# Patient Record
Sex: Female | Born: 1945
Health system: Southern US, Community
[De-identification: ages and names within clinical notes are randomized; demographics above are authoritative.]

## PROBLEM LIST (undated history)

## (undated) DIAGNOSIS — T4145XA Adverse effect of unspecified anesthetic, initial encounter: Secondary | ICD-10-CM

## (undated) DIAGNOSIS — E785 Hyperlipidemia, unspecified: Secondary | ICD-10-CM

## (undated) DIAGNOSIS — K121 Other forms of stomatitis: Secondary | ICD-10-CM

## (undated) DIAGNOSIS — R42 Dizziness and giddiness: Secondary | ICD-10-CM

## (undated) DIAGNOSIS — M159 Polyosteoarthritis, unspecified: Secondary | ICD-10-CM

## (undated) DIAGNOSIS — J309 Allergic rhinitis, unspecified: Secondary | ICD-10-CM

## (undated) DIAGNOSIS — T8859XA Other complications of anesthesia, initial encounter: Secondary | ICD-10-CM

## (undated) DIAGNOSIS — K648 Other hemorrhoids: Secondary | ICD-10-CM

## (undated) DIAGNOSIS — K219 Gastro-esophageal reflux disease without esophagitis: Secondary | ICD-10-CM

## (undated) DIAGNOSIS — R112 Nausea with vomiting, unspecified: Secondary | ICD-10-CM

## (undated) DIAGNOSIS — K5790 Diverticulosis of intestine, part unspecified, without perforation or abscess without bleeding: Secondary | ICD-10-CM

## (undated) DIAGNOSIS — T753XXA Motion sickness, initial encounter: Secondary | ICD-10-CM

## (undated) DIAGNOSIS — Z9889 Other specified postprocedural states: Secondary | ICD-10-CM

## (undated) DIAGNOSIS — D649 Anemia, unspecified: Secondary | ICD-10-CM

## (undated) DIAGNOSIS — T7840XA Allergy, unspecified, initial encounter: Secondary | ICD-10-CM

## (undated) DIAGNOSIS — I872 Venous insufficiency (chronic) (peripheral): Secondary | ICD-10-CM

## (undated) DIAGNOSIS — I82409 Acute embolism and thrombosis of unspecified deep veins of unspecified lower extremity: Secondary | ICD-10-CM

## (undated) HISTORY — DX: Polyosteoarthritis, unspecified: M15.9

## (undated) HISTORY — DX: Other forms of stomatitis: K12.1

## (undated) HISTORY — DX: Allergy, unspecified, initial encounter: T78.40XA

## (undated) HISTORY — DX: Hyperlipidemia, unspecified: E78.5

## (undated) HISTORY — DX: Venous insufficiency (chronic) (peripheral): I87.2

## (undated) HISTORY — DX: Anemia, unspecified: D64.9

## (undated) HISTORY — DX: Gastro-esophageal reflux disease without esophagitis: K21.9

## (undated) HISTORY — PX: KNEE ARTHROSCOPY: SUR90

## (undated) HISTORY — DX: Allergic rhinitis, unspecified: J30.9

## (undated) HISTORY — PX: TONSILLECTOMY AND ADENOIDECTOMY: SUR1326

## (undated) HISTORY — DX: Acute embolism and thrombosis of unspecified deep veins of unspecified lower extremity: I82.409

---

## 1994-12-15 HISTORY — PX: ABDOMINAL HYSTERECTOMY: SHX81

## 1998-12-15 HISTORY — PX: CHOLECYSTECTOMY: SHX55

## 2001-12-15 HISTORY — PX: BREAST BIOPSY: SHX20

## 2002-04-06 IMAGING — MG UNKNOWN MG STUDY
1 series · 6 of 6 positions shown · non-contrast
Comparison: none

REASON FOR EXAM: dx uni rt & fsc 611.72...hm [id number]

[Series 4106: R CC · right · 6 of 6 slices shown]
[im 1/6]
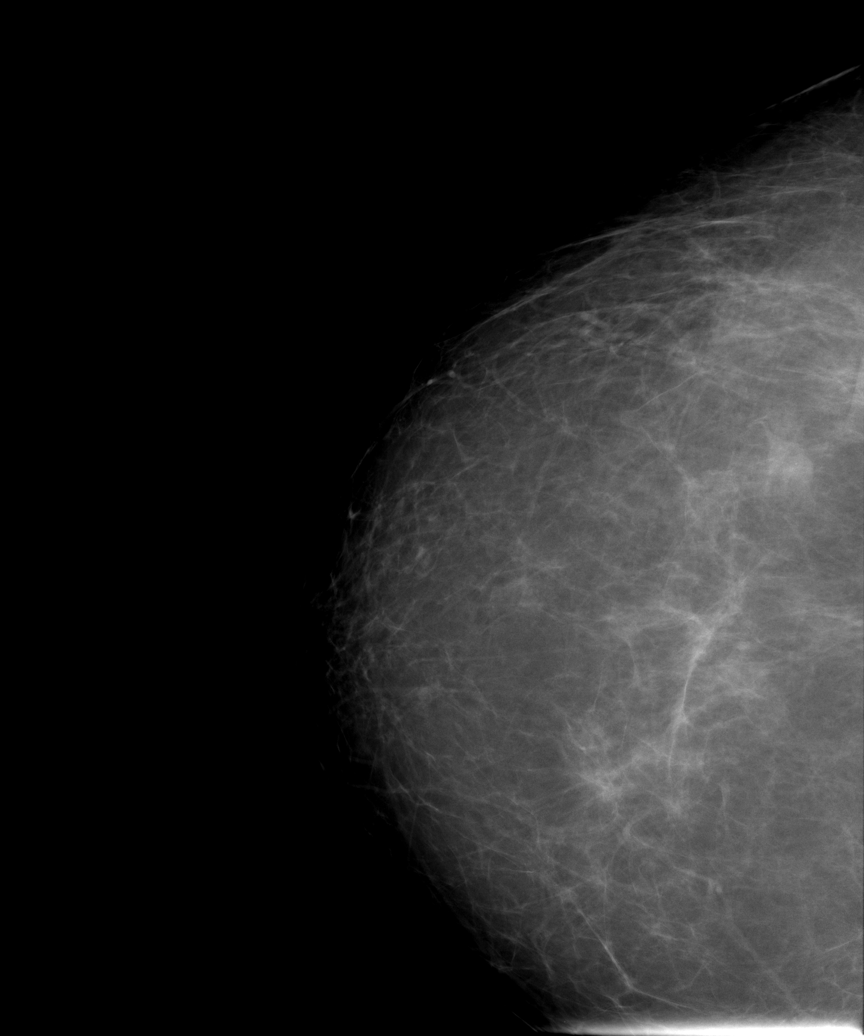
[im 2/6]
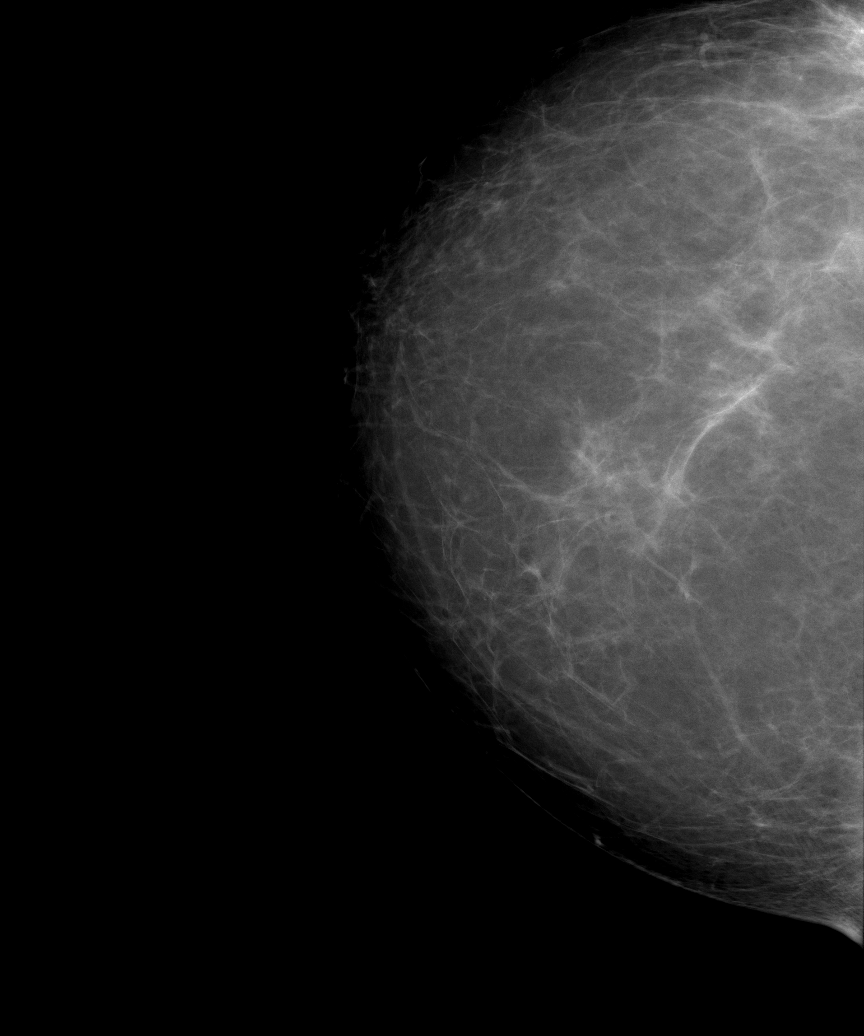
[im 3/6]
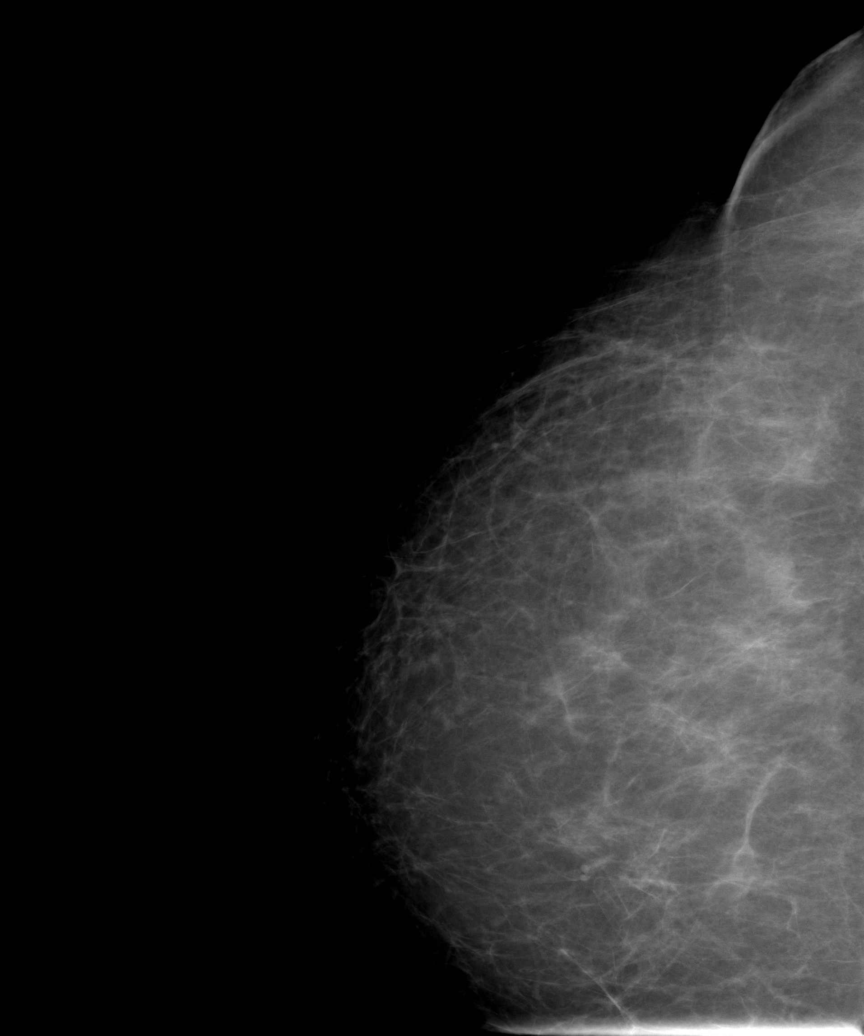
[im 4/6]
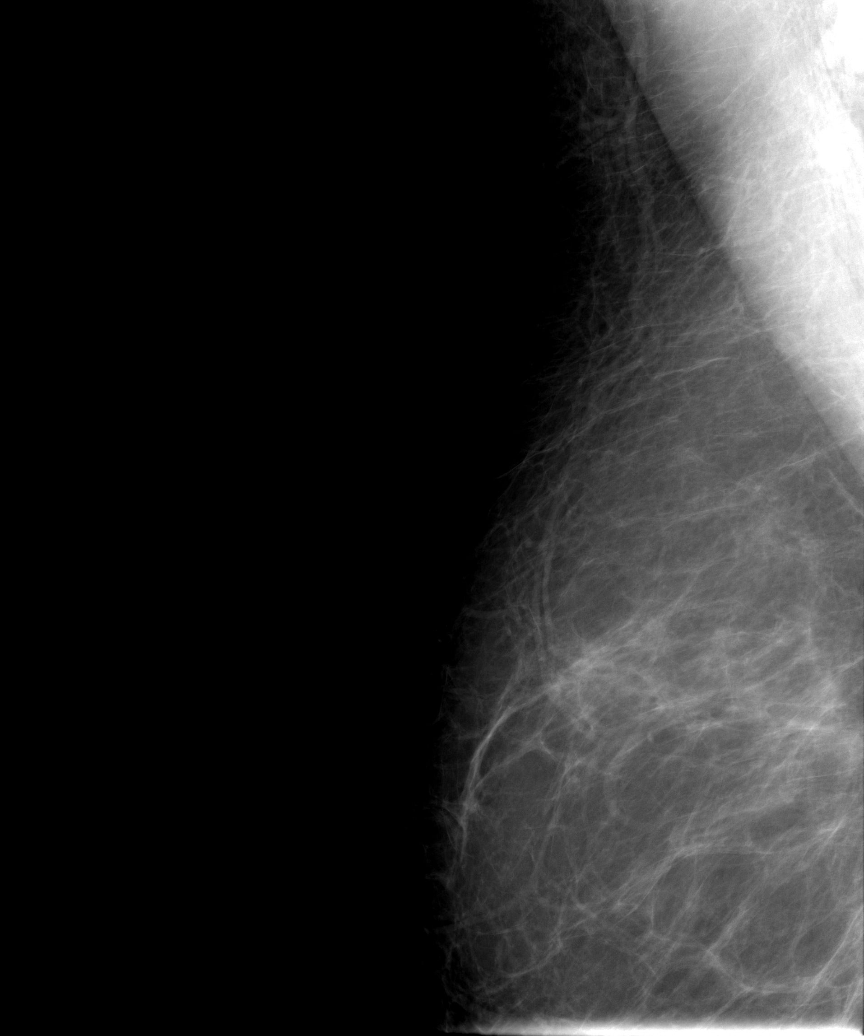
[im 5/6]
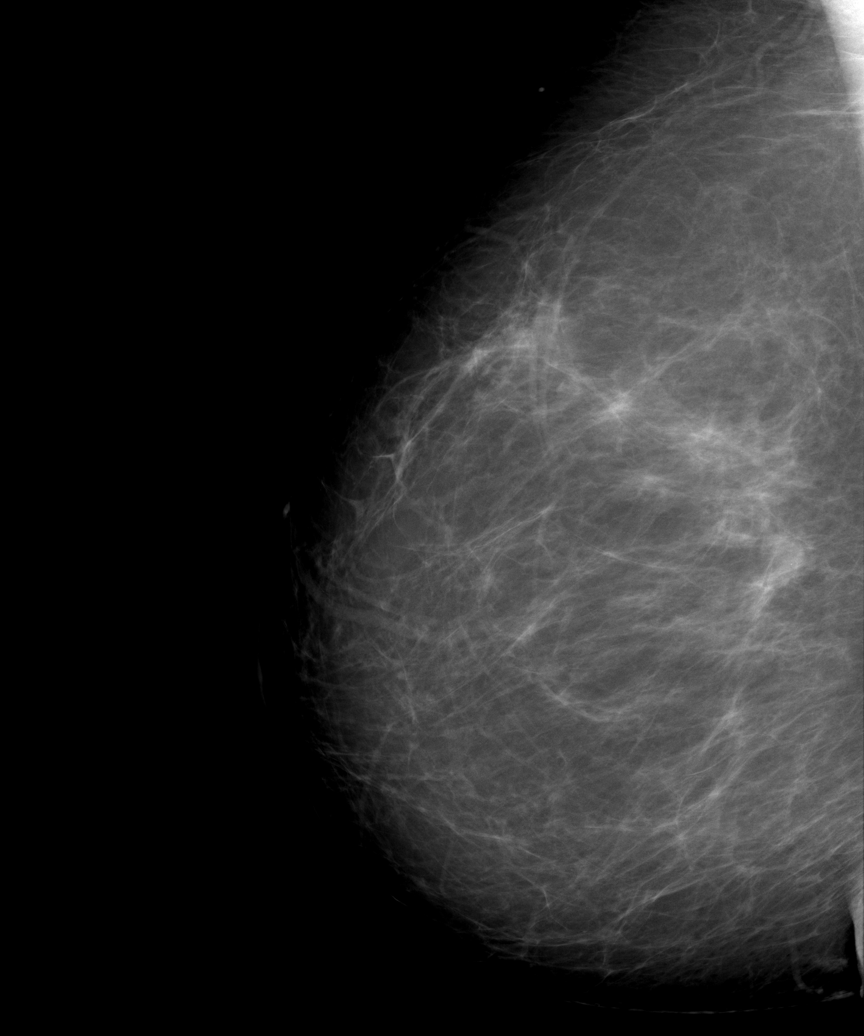
[im 6/6]
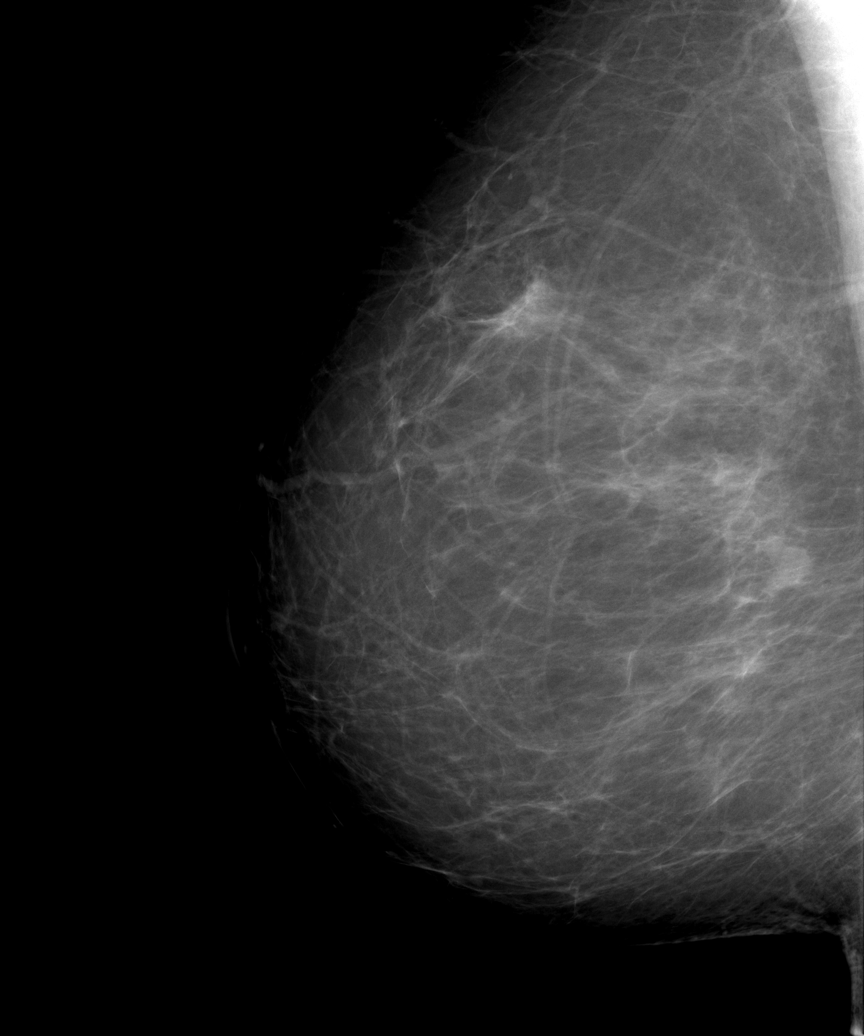

[6 of 6 positions shown; findings below may reference images not displayed]

Procedure: DIGITAL RIGHT MAMMOGRAM

 Multiple images were obtained and compared with previous film screen of
08/04/01 and 06/03/00. There is noted some subtle nodularity to the upper
outer portion of the RIGHT breast. It appears less obvious than on the
previous exam. Certainly not more prominent. Previous ultrasound was
negative in this region. Continued bilateral follow up in 6-months when
bilateral is due would be recommended.

 BI-RADS: Category 3-Probably Benign Finding.

 A NEGATIVE MAMMOGRAM REPORT DOES NOT PRECLUDE BIOPSY OR OTHER EVALUATION OF
A CLINICALLY PALPABLE OR OTHERWISE SUSPICIOUS MASS OR LESION. BREAST CANCER
MAY NOT BE DETECTED BY MAMMOGRAPHY IN UP TO 10% OF CASES.

## 2004-10-09 ENCOUNTER — Ambulatory Visit: Payer: Self-pay | Admitting: Unknown Physician Specialty

## 2005-10-22 ENCOUNTER — Ambulatory Visit: Payer: Self-pay | Admitting: Unknown Physician Specialty

## 2006-12-02 ENCOUNTER — Ambulatory Visit: Payer: Self-pay | Admitting: Specialist

## 2008-11-03 LAB — HM DEXA SCAN: HM Dexa Scan: NORMAL

## 2008-12-22 LAB — HM COLONOSCOPY

## 2010-02-21 ENCOUNTER — Ambulatory Visit: Payer: Self-pay | Admitting: Gastroenterology

## 2010-04-27 ENCOUNTER — Emergency Department: Payer: Self-pay | Admitting: Emergency Medicine

## 2010-05-29 ENCOUNTER — Ambulatory Visit: Payer: Self-pay | Admitting: Specialist

## 2010-08-22 ENCOUNTER — Ambulatory Visit: Payer: Self-pay | Admitting: Pain Medicine

## 2010-11-01 ENCOUNTER — Ambulatory Visit: Payer: Self-pay | Admitting: Specialist

## 2011-08-20 ENCOUNTER — Ambulatory Visit: Payer: Self-pay | Admitting: Family

## 2011-08-26 ENCOUNTER — Ambulatory Visit (INDEPENDENT_AMBULATORY_CARE_PROVIDER_SITE_OTHER): Payer: Medicare PPO | Admitting: Internal Medicine

## 2011-08-26 ENCOUNTER — Encounter: Payer: Self-pay | Admitting: Internal Medicine

## 2011-08-26 DIAGNOSIS — Z23 Encounter for immunization: Secondary | ICD-10-CM

## 2011-08-26 DIAGNOSIS — E785 Hyperlipidemia, unspecified: Secondary | ICD-10-CM | POA: Insufficient documentation

## 2011-08-26 DIAGNOSIS — K259 Gastric ulcer, unspecified as acute or chronic, without hemorrhage or perforation: Secondary | ICD-10-CM | POA: Insufficient documentation

## 2011-08-26 DIAGNOSIS — M48062 Spinal stenosis, lumbar region with neurogenic claudication: Secondary | ICD-10-CM | POA: Insufficient documentation

## 2011-08-26 DIAGNOSIS — I82409 Acute embolism and thrombosis of unspecified deep veins of unspecified lower extremity: Secondary | ICD-10-CM | POA: Insufficient documentation

## 2011-08-26 DIAGNOSIS — I872 Venous insufficiency (chronic) (peripheral): Secondary | ICD-10-CM

## 2011-08-26 DIAGNOSIS — K219 Gastro-esophageal reflux disease without esophagitis: Secondary | ICD-10-CM

## 2011-08-26 DIAGNOSIS — M159 Polyosteoarthritis, unspecified: Secondary | ICD-10-CM

## 2011-08-26 DIAGNOSIS — J309 Allergic rhinitis, unspecified: Secondary | ICD-10-CM | POA: Insufficient documentation

## 2011-08-26 NOTE — Assessment & Plan Note (Signed)
Ongoing back problems that defy diagnosis Has been sedentary due to DVT and knees Needs to be more active Etodolac helps but has been concerned due to coumadin

## 2011-08-26 NOTE — Assessment & Plan Note (Signed)
Some indigestion but basically controlled Continue the PPI--esp since using NSAID and past history of ulcers

## 2011-08-26 NOTE — Progress Notes (Signed)
Subjective:    Patient ID: Alice Donaldson, female    DOB: 1946-04-13, 65 y.o.   MRN: [id number]  HPI Needs primary care physician Dr Barnabas Lister had been doing primary care I had taken care of her mom at [facility] and after  Has GERD Has had EGD by Dr Roque Lias on PPI No swallowing problems  On treatment for high cholesterol Parents did die from MIs but not young and had other problems She doesn't feel well on it Has muscle aching in general  Working on proper eating Hopes to lose weight Hard to exercise due to ongoing back problems Has seen ortho here and someone at [facility] Does try to walk briefly every day  Had knee arthroscopy on left 6/11 Had increasing edema Diagnosed with DVT in September On coumadin since then (initially hospitalized)  Saw Dr Earnestine Leys Has had lymphedema and venous insufficiency Now with ulcer on left leg---PA with Dr Earnestine Leys managing this Using silvadene  No current outpatient prescriptions on file prior to visit.    No Known Allergies  Past Medical History  Diagnosis Date  . GERD (gastroesophageal reflux disease)   . Allergic rhinitis, cause unspecified   . Osteoarthrosis involving, or with mention of more than one site, but not specified as generalized, multiple sites   . Hyperlipidemia   . DVT (deep venous thrombosis)   . Unspecified venous (peripheral) insufficiency     Past Surgical History  Procedure Date  . Cholecystectomy 2000  . Breast biopsy 2003  . Tonsillectomy and adenoidectomy childhood  . Abdominal hysterectomy 1996  . Knee arthroscopy     right in 2007, left in 2011    Family History  Problem Relation Age of Onset  . Heart disease Mother   . Heart disease Father   . Diabetes Son 5    Type 1  . Hypertension Son   . Hyperlipidemia Neg Hx   . Depression Neg Hx   . Stroke Brother   . Diabetes Other 2    type 1    History   Social History  . Marital Status: Married    Spouse Name: N/A    Number of  Children: 1  . Years of Education: N/A   Occupational History  . Dental hygienist     retired   Social History Main Topics  . Smoking status: Former Smoker    Types: Cigarettes    Quit date: 12/15/1990  . Smokeless tobacco: Never Used  . Alcohol Use: Yes     rare glass of wine  . Drug Use: No  . Sexually Active: Not on file   Other Topics Concern  . Not on file   Social History Narrative   No living willAsks for husband to make decisions if neededWould accept resuscitation   Review of Systems  Constitutional: Positive for unexpected weight change.       Has gained 30# in past year with immobility Wears seat belt  HENT: Positive for tinnitus. Negative for hearing loss and dental problem.   Eyes: Negative for visual disturbance.  Respiratory: Negative for cough and shortness of breath.        Occ gets sense she needs sighing breath  Cardiovascular: Positive for palpitations and leg swelling. Negative for chest pain.       Occ brief palps at rest---stable over years  Gastrointestinal:       Heartburn mostly controlled on meds---now with more indigestion  Genitourinary: Negative for dysuria, difficulty urinating and dyspareunia.  Musculoskeletal:  Positive for back pain and arthralgias.       Uses the etodolac once a day--it really helps  Neurological: Negative for dizziness, syncope, light-headedness and headaches.  Psychiatric/Behavioral: Positive for sleep disturbance and dysphoric mood. The patient is not nervous/anxious.        Occ trouble sleeping---better since not working Has used lorazepam just for sleep at times Mild depressed mood due to being forced to retire and tough health year (causing retirement)       Objective:   Physical Exam  Constitutional: She appears well-developed and well-nourished. No distress.  HENT:  Mouth/Throat: Oropharynx is clear and moist. No oropharyngeal exudate.  Eyes: Conjunctivae and EOM are normal. Pupils are equal, round, and  reactive to light.  Neck: Normal range of motion. Neck supple. No thyromegaly present.  Cardiovascular: Normal rate, regular rhythm, normal heart sounds and intact distal pulses.  Exam reveals no gallop.   No murmur heard. Pulmonary/Chest: Effort normal and breath sounds normal. No respiratory distress. She has no wheezes. She has no rales.  Abdominal: Soft. There is no tenderness.  Musculoskeletal: Normal range of motion. She exhibits edema. She exhibits no tenderness.       1+ edema in ankles  Lymphadenopathy:    She has no cervical adenopathy.  Skin: No rash noted.       Slight ulcerations along medial left ankle which now have red granulation tissue No inflammation          Assessment & Plan:

## 2011-08-26 NOTE — Assessment & Plan Note (Signed)
Has myalgias on the simvastatin No arterial vascular disease  Will stop for now

## 2011-08-26 NOTE — Assessment & Plan Note (Addendum)
Has been almost 1 year since DVT post op Had ultrasound that showed chronic DVT with recanalization Has appt at [facility] tomorrow--needs definitive answer Could consider checking d-dimer

## 2011-08-26 NOTE — Patient Instructions (Signed)
Please ask the vascular specialist whether you can go off the coumadin with the chronic but recanalized DVT. Do we need to check d-dimer first, and after going off coumadin, if reasonable?

## 2011-08-26 NOTE — Assessment & Plan Note (Signed)
With healing ulcer Probably needs to use her support hose but can hold off on pump

## 2011-09-02 ENCOUNTER — Encounter: Payer: Self-pay | Admitting: Internal Medicine

## 2011-09-15 ENCOUNTER — Ambulatory Visit: Payer: Self-pay | Admitting: Family

## 2011-09-17 ENCOUNTER — Other Ambulatory Visit: Payer: Self-pay | Admitting: *Deleted

## 2011-09-17 MED ORDER — ETODOLAC 500 MG PO TABS: 500.0000 mg | ORAL_TABLET | Freq: Two times a day (BID) | ORAL | Status: DC

## 2011-09-17 NOTE — Telephone Encounter (Signed)
Phoned request from patient.  She has previously gotten this from Dr. Barnabas Lister in [place].  I advised her that Dr. Alphonsus Sias is out of the office and it would be Thursday.

## 2011-09-19 DIAGNOSIS — I739 Peripheral vascular disease, unspecified: Secondary | ICD-10-CM | POA: Insufficient documentation

## 2011-09-19 DIAGNOSIS — I999 Unspecified disorder of circulatory system: Secondary | ICD-10-CM | POA: Insufficient documentation

## 2011-09-22 ENCOUNTER — Ambulatory Visit (INDEPENDENT_AMBULATORY_CARE_PROVIDER_SITE_OTHER): Payer: Medicare PPO | Admitting: Internal Medicine

## 2011-09-22 ENCOUNTER — Other Ambulatory Visit: Payer: Self-pay | Admitting: Internal Medicine

## 2011-09-22 ENCOUNTER — Encounter: Payer: Self-pay | Admitting: Internal Medicine

## 2011-09-22 ENCOUNTER — Telehealth: Payer: Self-pay | Admitting: *Deleted

## 2011-09-22 VITALS — BP 134/92 | HR 74 | Ht 67.0 in | Wt 257.0 lb

## 2011-09-22 DIAGNOSIS — I82409 Acute embolism and thrombosis of unspecified deep veins of unspecified lower extremity: Secondary | ICD-10-CM

## 2011-09-22 DIAGNOSIS — M159 Polyosteoarthritis, unspecified: Secondary | ICD-10-CM

## 2011-09-22 DIAGNOSIS — K259 Gastric ulcer, unspecified as acute or chronic, without hemorrhage or perforation: Secondary | ICD-10-CM

## 2011-09-22 DIAGNOSIS — Z7902 Long term (current) use of antithrombotics/antiplatelets: Secondary | ICD-10-CM

## 2011-09-22 DIAGNOSIS — E785 Hyperlipidemia, unspecified: Secondary | ICD-10-CM

## 2011-09-22 DIAGNOSIS — Z7901 Long term (current) use of anticoagulants: Secondary | ICD-10-CM | POA: Insufficient documentation

## 2011-09-22 DIAGNOSIS — Z5181 Encounter for therapeutic drug level monitoring: Secondary | ICD-10-CM | POA: Insufficient documentation

## 2011-09-22 LAB — POCT INR: INR: 2.4

## 2011-09-22 NOTE — Assessment & Plan Note (Addendum)
will continue the PPI since she is on the etodolac Some night symptoms---will try omeprazole at night or bid

## 2011-09-22 NOTE — Progress Notes (Signed)
Subjective:    Patient ID: Alice Donaldson, female    DOB: Nov 15, 1946, 65 y.o.   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  Most is once a day Rarely uses the muscle relaxer  Did stop the simvastatin Has helped the muscle aching  Current Outpatient Prescriptions on File Prior to Visit  Medication Sig Dispense Refill  . acetaminophen (TYLENOL) 500 MG tablet Take 500 mg by mouth every 6 (six) hours as needed.        . Biotin 10 MG TABS Take by mouth daily.        . Calcium Carbonate-Vitamin D (CALCIUM-VITAMIN D) 600-200 MG-UNIT CAPS Take by mouth daily.        . chlorzoxazone (PARAFON) 500 MG tablet Take 500 mg by mouth as needed.        . Cholecalciferol (VITAMIN D3) 1000 UNITS CAPS Take by mouth daily.        [name] docusate sodium (COLACE) 100 MG capsule Take 100 mg by mouth as needed.       . etodolac (LODINE) 500 MG tablet Take 1 tablet (500 mg total) by mouth 2 (two) times daily.  30 tablet  0  . LORazepam (ATIVAN) 1 MG tablet Take 0.5 mg by mouth as needed.        [name] omeprazole (PRILOSEC) 20 MG capsule Take 20 mg by mouth daily.       [name] PROBIOTIC CAPS Take by mouth daily.        [name] warfarin (COUMADIN) 5 MG tablet Take 5 mg by mouth as directed.       . warfarin (COUMADIN) 6 MG tablet Take 6 mg by mouth as directed.       . zinc gluconate 50 MG tablet Take 50 mg by mouth daily.          No Known Allergies  Past Medical History  Diagnosis Date  . GERD (gastroesophageal reflux  disease)   . Allergic rhinitis, cause unspecified   . Osteoarthrosis involving, or with mention of more than one site, but not specified as generalized, multiple sites   . Hyperlipidemia   . DVT (deep venous thrombosis)   . Unspecified venous (peripheral) insufficiency   . Gastric ulcer     in past    Past Surgical History  Procedure Date  . Cholecystectomy 2000  . Breast biopsy 2003  . Tonsillectomy and adenoidectomy childhood  . Abdominal hysterectomy 1996  . Knee arthroscopy     right in 2007, left in 2011    Family History  Problem Relation Age of Onset  . Heart disease Mother   . Heart disease Father   . Diabetes Son 5    Type 1  . Hypertension Son   . Hyperlipidemia Neg Hx   . Depression Neg Hx   . Stroke Brother   . Diabetes Other 2    type 1    History   Social History  . Marital Status: Married    Spouse Name: N/A  Number of Children: 1  . Years of Education: N/A   Occupational History  . Dental hygienist     retired   Social History Main Topics  . Smoking status: Former Smoker    Types: Cigarettes    Quit date: 12/15/1990  . Smokeless tobacco: Never Used  . Alcohol Use: Yes     rare glass of wine  . Drug Use: No  . Sexually Active: Not on file   Other Topics Concern  . Not on file   Social History Narrative   No living willAsks for husband to make decisions if neededWould accept resuscitation   Review of Systems Sleeping okay Weight is stable     Objective:   Physical Exam  Constitutional: She appears well-developed and well-nourished. No distress.  Neck: Normal range of motion. Neck supple.  Cardiovascular: Normal rate, regular rhythm, normal heart sounds and intact distal pulses.  Exam reveals no gallop.   No murmur heard. Pulmonary/Chest: Effort normal and breath sounds normal. No respiratory distress. She has no wheezes. She has no rales.  Musculoskeletal: She exhibits no edema and no tenderness.  Lymphadenopathy:    She has  no cervical adenopathy.  Skin:       Small ulcer--mostly granulated--on anterior surface of left ankle  Psychiatric: She has a normal mood and affect. Her behavior is normal. Judgment and thought content normal.          Assessment & Plan:

## 2011-09-22 NOTE — Patient Instructions (Signed)
Continue current dose, check in 4 weeks  

## 2011-09-22 NOTE — Assessment & Plan Note (Signed)
Myalgias better off the simvastatin Will stay off and recheck at a later visit

## 2011-09-22 NOTE — Assessment & Plan Note (Signed)
Left knee is somewhat better but still has some pain Uses the etodolac daily Is getting stronger

## 2011-09-22 NOTE — Assessment & Plan Note (Signed)
Chronic Specialist believes she can go off the coumadin once she can wear compression hose regularly For now--continue coumadin  Would check d-dimer several weeks after stopping

## 2011-09-24 NOTE — Telephone Encounter (Signed)
Opened in error

## 2011-10-20 ENCOUNTER — Ambulatory Visit (INDEPENDENT_AMBULATORY_CARE_PROVIDER_SITE_OTHER): Payer: Medicare PPO | Admitting: Internal Medicine

## 2011-10-20 DIAGNOSIS — Z5181 Encounter for therapeutic drug level monitoring: Secondary | ICD-10-CM

## 2011-10-20 DIAGNOSIS — Z7901 Long term (current) use of anticoagulants: Secondary | ICD-10-CM

## 2011-10-20 DIAGNOSIS — I82409 Acute embolism and thrombosis of unspecified deep veins of unspecified lower extremity: Secondary | ICD-10-CM

## 2011-10-20 LAB — POCT INR: INR: 2.4

## 2011-11-17 ENCOUNTER — Other Ambulatory Visit: Payer: Self-pay | Admitting: Internal Medicine

## 2011-11-17 MED ORDER — PROBIOTIC PO CAPS: 1.0000 | ORAL_CAPSULE | Freq: Every day | ORAL | Status: DC

## 2011-11-17 NOTE — Telephone Encounter (Signed)
rx sent to pharmacy by e-script  

## 2011-11-17 NOTE — Telephone Encounter (Signed)
This might be OTC but okay to refill for 1 year

## 2011-11-26 ENCOUNTER — Other Ambulatory Visit: Payer: Self-pay | Admitting: Internal Medicine

## 2011-11-28 ENCOUNTER — Encounter: Payer: Self-pay | Admitting: Internal Medicine

## 2011-12-01 ENCOUNTER — Encounter: Payer: Self-pay | Admitting: *Deleted

## 2011-12-01 ENCOUNTER — Other Ambulatory Visit: Payer: Self-pay | Admitting: *Deleted

## 2011-12-01 MED ORDER — ETODOLAC 500 MG PO TABS: 500.0000 mg | ORAL_TABLET | Freq: Two times a day (BID) | ORAL | Status: DC

## 2011-12-04 ENCOUNTER — Ambulatory Visit (INDEPENDENT_AMBULATORY_CARE_PROVIDER_SITE_OTHER): Payer: Medicare PPO | Admitting: Internal Medicine

## 2011-12-04 ENCOUNTER — Telehealth: Payer: Self-pay | Admitting: Internal Medicine

## 2011-12-04 ENCOUNTER — Encounter: Payer: Self-pay | Admitting: Internal Medicine

## 2011-12-04 VITALS — BP 140/90 | HR 73 | Temp 98.2°F | Wt 257.0 lb

## 2011-12-04 DIAGNOSIS — J209 Acute bronchitis, unspecified: Secondary | ICD-10-CM | POA: Insufficient documentation

## 2011-12-04 MED ORDER — AZITHROMYCIN 250 MG PO TABS: ORAL_TABLET | ORAL | Status: AC

## 2011-12-04 NOTE — Telephone Encounter (Signed)
Spoke with patient and she declined today, per pt she haven't showered today and don't feel like rushing to get here, she will see Para March tomorrow.

## 2011-12-04 NOTE — Telephone Encounter (Signed)
Patient called and wanted to know if we could call in a antibiotic for her congestion.  I made her an appt. For tomorrow with Para March.  I informed her that we can't call an antibiotic in without evaluating her but she insisted I ask you.

## 2011-12-04 NOTE — Assessment & Plan Note (Signed)
Has been sick over a week and worsening Feels it moving in chest Will start empiric rx with z-pak

## 2011-12-04 NOTE — Telephone Encounter (Signed)
No, we just don't do that You can add her on at 4:30 today if she wants to come in today

## 2011-12-04 NOTE — Progress Notes (Signed)
Subjective:    Patient ID: Alice Donaldson, female    DOB: Dec 11, 1946, 65 y.o.   MRN: [id number]  HPI Not on coumadin anymore Still seeing vascular specialist at [facility]) Wears support hose all the time now very faithfully  Not ready for back injections since the fungal meninigitis  Started with respiratory illness over a week ago Cough at first--with some phlegm Voice is off since yesterday  May have had low grade fever--feels hot with head sweating, then cold Swallowing any mucus Left side of throat feels swollen Not really in head Does have ringing in ears and feels fluid on right Some SOB  Only dayquil---some help  Current Outpatient Prescriptions on File Prior to Visit  Medication Sig Dispense Refill  . acetaminophen (TYLENOL) 500 MG tablet Take 500 mg by mouth every 6 (six) hours as needed.        . Biotin 10 MG TABS Take by mouth daily.        . Calcium Carbonate-Vitamin D (CALCIUM-VITAMIN D) 600-200 MG-UNIT CAPS Take by mouth daily.        . chlorzoxazone (PARAFON) 500 MG tablet Take 500 mg by mouth as needed.        . Cholecalciferol (VITAMIN D3) 1000 UNITS CAPS Take by mouth daily.        [name] docusate sodium (COLACE) 100 MG capsule Take 100 mg by mouth as needed.       . etodolac (LODINE) 500 MG tablet Take 1 tablet (500 mg total) by mouth 2 (two) times daily.  60 tablet  11  . LORazepam (ATIVAN) 1 MG tablet Take 0.5 mg by mouth as needed.        [name] omeprazole (PRILOSEC) 20 MG capsule Take 20 mg by mouth daily.       [name] PROBIOTIC CAPS Take 1 capsule by mouth daily.  100 capsule  3  . zinc gluconate 50 MG tablet Take 50 mg by mouth daily.          No Known Allergies  Past Medical History  Diagnosis Date  . GERD (gastroesophageal reflux disease)   . Allergic rhinitis, cause unspecified   . Osteoarthrosis involving, or with mention of more than one site, but not specified as generalized, multiple sites   . Hyperlipidemia   . DVT (deep venous thrombosis)   .  Unspecified venous (peripheral) insufficiency   . Gastric ulcer     in past    Past Surgical History  Procedure Date  . Cholecystectomy 2000  . Breast biopsy 2003  . Tonsillectomy and adenoidectomy childhood  . Abdominal hysterectomy 1996  . Knee arthroscopy     right in 2007, left in 2011    Family History  Problem Relation Age of Onset  . Heart disease Mother   . Heart disease Father   . Diabetes Son 5    Type 1  . Hypertension Son   . Hyperlipidemia Neg Hx   . Depression Neg Hx   . Stroke Brother   . Diabetes Other 2    type 1    History   Social History  . Marital Status: Married    Spouse Name: N/A    Number of Children: 1  . Years of Education: N/A   Occupational History  . Dental hygienist     retired   Social History Main Topics  . Smoking status: Former Smoker    Types: Cigarettes    Quit date: 12/15/1990  . Smokeless tobacco: Never Used  .  Alcohol Use: Yes     rare glass of wine  . Drug Use: No  . Sexually Active: Not on file   Other Topics Concern  . Not on file   Social History Narrative   No living willAsks for husband to make decisions if neededWould accept resuscitation   Review of Systems No vomiting or diarrhea Appetite okay     Objective:   Physical Exam  Constitutional: She appears well-developed and well-nourished. No distress.  HENT:       No sinus tenderness Mild nasal congestion Slight pharyngeal injection--no exudate  Neck: Normal range of motion.  Pulmonary/Chest: Effort normal and breath sounds normal. No respiratory distress. She has no wheezes. She has no rales.  Lymphadenopathy:    She has no cervical adenopathy.          Assessment & Plan:

## 2011-12-04 NOTE — Telephone Encounter (Signed)
Now has decided to come

## 2011-12-05 ENCOUNTER — Ambulatory Visit: Payer: Medicare PPO | Admitting: Family Medicine

## 2012-01-21 ENCOUNTER — Other Ambulatory Visit: Payer: Self-pay | Admitting: Internal Medicine

## 2012-01-21 NOTE — Telephone Encounter (Signed)
No answer at home number, .left message on cell to have patient return my call.

## 2012-01-22 MED ORDER — ETODOLAC 500 MG PO TABS: 500.0000 mg | ORAL_TABLET | Freq: Two times a day (BID) | ORAL | Status: DC

## 2012-01-22 NOTE — Telephone Encounter (Signed)
rx faxed to [facility]

## 2012-01-23 ENCOUNTER — Other Ambulatory Visit: Payer: Self-pay | Admitting: *Deleted

## 2012-01-23 MED ORDER — OMEPRAZOLE 20 MG PO CPDR: 20.0000 mg | DELAYED_RELEASE_CAPSULE | Freq: Every day | ORAL | Status: DC

## 2012-03-23 ENCOUNTER — Ambulatory Visit: Payer: Medicare PPO | Admitting: Internal Medicine

## 2012-04-06 ENCOUNTER — Ambulatory Visit (INDEPENDENT_AMBULATORY_CARE_PROVIDER_SITE_OTHER): Payer: Medicare PPO | Admitting: Internal Medicine

## 2012-04-06 ENCOUNTER — Encounter: Payer: Self-pay | Admitting: Internal Medicine

## 2012-04-06 VITALS — BP 128/88 | HR 70 | Temp 98.0°F | Ht 67.0 in | Wt 256.0 lb

## 2012-04-06 DIAGNOSIS — M159 Polyosteoarthritis, unspecified: Secondary | ICD-10-CM

## 2012-04-06 DIAGNOSIS — K259 Gastric ulcer, unspecified as acute or chronic, without hemorrhage or perforation: Secondary | ICD-10-CM

## 2012-04-06 DIAGNOSIS — E785 Hyperlipidemia, unspecified: Secondary | ICD-10-CM

## 2012-04-06 DIAGNOSIS — K219 Gastro-esophageal reflux disease without esophagitis: Secondary | ICD-10-CM

## 2012-04-06 DIAGNOSIS — I872 Venous insufficiency (chronic) (peripheral): Secondary | ICD-10-CM

## 2012-04-06 LAB — CBC WITH DIFFERENTIAL/PLATELET
Basophils Absolute: 0 10*3/uL (ref 0.0–0.1)
Basophils Relative: 0.7 % (ref 0.0–3.0)
Eosinophils Absolute: 0.2 10*3/uL (ref 0.0–0.7)
HCT: 43.2 % (ref 36.0–46.0)
Hemoglobin: 14.5 g/dL (ref 12.0–15.0)
Lymphocytes Relative: 33.8 % (ref 12.0–46.0)
Lymphs Abs: 2 10*3/uL (ref 0.7–4.0)
MCHC: 33.6 g/dL (ref 30.0–36.0)
Monocytes Relative: 11.9 % (ref 3.0–12.0)
Neutro Abs: 3 10*3/uL (ref 1.4–7.7)
RBC: 4.69 Mil/uL (ref 3.87–5.11)
RDW: 13.1 % (ref 11.5–14.6)

## 2012-04-06 LAB — HEPATIC FUNCTION PANEL
ALT: 23 U/L (ref 0–35)
Albumin: 3.7 g/dL (ref 3.5–5.2)
Bilirubin, Direct: 0 mg/dL (ref 0.0–0.3)
Total Protein: 7.2 g/dL (ref 6.0–8.3)

## 2012-04-06 LAB — LIPID PANEL: Total CHOL/HDL Ratio: 6

## 2012-04-06 LAB — BASIC METABOLIC PANEL
CO2: 27 mEq/L (ref 19–32)
Glucose, Bld: 86 mg/dL (ref 70–99)
Potassium: 4.3 mEq/L (ref 3.5–5.1)
Sodium: 140 mEq/L (ref 135–145)

## 2012-04-06 LAB — LDL CHOLESTEROL, DIRECT: Direct LDL: 169.2 mg/dL

## 2012-04-06 NOTE — Assessment & Plan Note (Signed)
Mostly back pain Getting acupuncture Uses the etodolac regularly--remains on PPI

## 2012-04-06 NOTE — Assessment & Plan Note (Signed)
Will stay on the meds

## 2012-04-06 NOTE — Assessment & Plan Note (Signed)
Symptoms seem better

## 2012-04-06 NOTE — Assessment & Plan Note (Signed)
Left calf after her surgery Off the coumadin and just uses the support hose 6 month follow up at [facility]

## 2012-04-06 NOTE — Progress Notes (Signed)
Subjective:    Patient ID: Alice Donaldson, female    DOB: 1946/04/13, 66 y.o.   MRN: [id number]  HPI Doing well in general  Currently doing acupuncture for her back (Dr Lianne Bushy in [place]) Limits her movement Variable results from that---occ gets good day Has been doing a bit more walking now Still uses etodolac and this helps Pain with any prolonged standing or walking  Omeprazole stopped helping stomach Now on pantoprazole---seems to be helping now (Dr Marva Panda changed) Had been on sucralfate for a while---now down to bid  Off coumadin since November Got the okay from [facility] doctors Did have follow up testing (d-dimer) apparently--and it was okay  No sig edema Ulcer healed well Wears support hose just on left leg---still trouble getting them on Wonders about the right leg---probably good to use there also but not obligatory Right leg doesn't swell  Current Outpatient Prescriptions on File Prior to Visit  Medication Sig Dispense Refill  . Calcium Carbonate-Vitamin D (CALCIUM-VITAMIN D) 600-200 MG-UNIT CAPS Take by mouth daily.        . chlorzoxazone (PARAFON) 500 MG tablet Take 500 mg by mouth as needed.        . Cholecalciferol (VITAMIN D3) 1000 UNITS CAPS Take by mouth daily.        [name] docusate sodium (COLACE) 100 MG capsule Take 100 mg by mouth as needed.       . etodolac (LODINE) 500 MG tablet Take 1 tablet (500 mg total) by mouth 2 (two) times daily.  180 tablet  3  . LORazepam (ATIVAN) 1 MG tablet Take 0.5 mg by mouth as needed.       . pantoprazole (PROTONIX) 40 MG tablet Take 40 mg by mouth daily.       [name] PROBIOTIC CAPS Take 1 capsule by mouth daily.  100 capsule  3  . sucralfate (CARAFATE) 1 G tablet Take 1 g by mouth 2 (two) times daily.         No Known Allergies  Past Medical History  Diagnosis Date  . GERD (gastroesophageal reflux disease)   . Allergic rhinitis, cause unspecified   . Osteoarthrosis involving, or with mention of more than one site, but not  specified as generalized, multiple sites   . Hyperlipidemia   . DVT (deep venous thrombosis)   . Unspecified venous (peripheral) insufficiency   . Gastric ulcer     in past    Past Surgical History  Procedure Date  . Cholecystectomy 2000  . Breast biopsy 2003  . Tonsillectomy and adenoidectomy childhood  . Abdominal hysterectomy 1996  . Knee arthroscopy     right in 2007, left in 2011    Family History  Problem Relation Age of Onset  . Heart disease Mother   . Heart disease Father   . Diabetes Son 5    Type 1  . Hypertension Son   . Hyperlipidemia Neg Hx   . Depression Neg Hx   . Stroke Brother   . Diabetes Other 2    type 1    History   Social History  . Marital Status: Married    Spouse Name: N/A    Number of Children: 1  . Years of Education: N/A   Occupational History  . Dental hygienist     retired   Social History Main Topics  . Smoking status: Former Smoker    Types: Cigarettes    Quit date: 12/15/1990  . Smokeless tobacco: Never Used  . Alcohol Use:  Yes     rare glass of wine  . Drug Use: No  . Sexually Active: Not on file   Other Topics Concern  . Not on file   Social History Narrative   No living willAsks for husband to make decisions if neededWould accept resuscitation   Review of Systems Weight is stable--tries to be careful with diet but inactivity hurts her Continues to do home exercises that past PT had recommended Sleeps okay with 1/4 lorazepam Rarely uses the chlorzoxazone    Objective:   Physical Exam  Constitutional: She appears well-developed and well-nourished. No distress.  Neck: Normal range of motion. Neck supple.  Cardiovascular: Normal rate, regular rhythm and normal heart sounds.  Exam reveals no gallop.   No murmur heard.      Good pulse on left Faint at best on right  Pulmonary/Chest: Effort normal and breath sounds normal. No respiratory distress. She has no wheezes. She has no rales.  Abdominal: Soft. There is  no tenderness.  Musculoskeletal: She exhibits no edema and no tenderness.  Lymphadenopathy:    She has no cervical adenopathy.  Skin:       Slight red papule along right lower neck---will just observe  Psychiatric: She has a normal mood and affect. Her behavior is normal.          Assessment & Plan:

## 2012-04-06 NOTE — Assessment & Plan Note (Signed)
Off the meds due to muscle pain Will just recheck labs off the med

## 2012-04-08 ENCOUNTER — Encounter: Payer: Self-pay | Admitting: *Deleted

## 2012-04-29 ENCOUNTER — Other Ambulatory Visit: Payer: Self-pay | Admitting: *Deleted

## 2012-04-29 MED ORDER — PANTOPRAZOLE SODIUM 40 MG PO TBEC: 40.0000 mg | DELAYED_RELEASE_TABLET | Freq: Every day | ORAL | Status: DC

## 2012-04-29 MED ORDER — SUCRALFATE 1 G PO TABS: 1.0000 g | ORAL_TABLET | Freq: Two times a day (BID) | ORAL | Status: DC

## 2012-05-05 ENCOUNTER — Other Ambulatory Visit: Payer: Self-pay

## 2012-05-05 NOTE — Telephone Encounter (Signed)
Pt left v/m [facility] has not received refill response. Call pt with response.

## 2012-05-05 NOTE — Telephone Encounter (Signed)
.  left message to have patient return my call.  

## 2012-05-05 NOTE — Telephone Encounter (Signed)
I see no request for refill Need to change sig to bid prn Find out how often she uses it If not much (that is what I think), okay #[phone] x 0 If she wants more than that, find out how she uses it and get back to me

## 2012-05-05 NOTE — Telephone Encounter (Signed)
Spoke with patient and this rx originally came from Dr. Barnabas Lister, pt states she takes 1/4 of a tab at bedtime to help sleep, how do I put that as a sig? Epic won't take that. I also advised to pt that we don't call [facility] that she would have to mail it in herself, pt states she will have [facility] fax [place] so we can fax them back.

## 2012-05-06 NOTE — Telephone Encounter (Signed)
We should probably change it to 0.5 and have her take 1/2  If using the 1mg , can put in "0.25mg "  If she will change to the 0.5mg , okay #45 x 1

## 2012-05-11 MED ORDER — LORAZEPAM 0.5 MG PO TABS: 0.2500 mg | ORAL_TABLET | Freq: Every evening | ORAL | Status: DC | PRN

## 2012-05-11 NOTE — Telephone Encounter (Signed)
.  left message to have patient return my call.  

## 2012-05-11 NOTE — Telephone Encounter (Signed)
Has this been addressed with her?

## 2012-05-11 NOTE — Telephone Encounter (Signed)
Spoke with patient and advised results Pt wanted this called into [facility] instead of [facility] Pt agrees with dose change.rx called into pharmacy

## 2012-06-29 ENCOUNTER — Encounter: Payer: Self-pay | Admitting: Internal Medicine

## 2012-06-29 ENCOUNTER — Ambulatory Visit (INDEPENDENT_AMBULATORY_CARE_PROVIDER_SITE_OTHER): Payer: Medicare PPO | Admitting: Internal Medicine

## 2012-06-29 VITALS — BP 140/88 | HR 83 | Ht 67.0 in | Wt 251.0 lb

## 2012-06-29 DIAGNOSIS — H8110 Benign paroxysmal vertigo, unspecified ear: Secondary | ICD-10-CM | POA: Insufficient documentation

## 2012-06-29 MED ORDER — MECLIZINE HCL 25 MG PO TABS: 25.0000 mg | ORAL_TABLET | Freq: Three times a day (TID) | ORAL | Status: AC | PRN

## 2012-06-29 NOTE — Assessment & Plan Note (Signed)
Recurrent No worrisome neuro findings No fluid in middle ears evident (so will hold off on prednisone) Will treat with meclizine No diuretics or other rx for now

## 2012-06-29 NOTE — Progress Notes (Signed)
Subjective:    Patient ID: Alice Donaldson, female    DOB: 27-Apr-1946, 66 y.o.   MRN: [id number]  HPI Having a lot of dizziness Started before March visit but was sporadic Not totally new--intermittent over years Relates to "pressure" (barometric) or travelling in different elevations  Gets dizzy ---if she turns, she feels like she is spinning Will notice if she lies down  Last spell over the past 10 days Noticed it with going back and forth from [place] twice this week Has ringing and popping in ears---very loud this time Has had Rx in past (prednisone) for fluid in ears and this helped Meclizine helped in past Hearing is okay  Current Outpatient Prescriptions on File Prior to Visit  Medication Sig Dispense Refill  . aspirin 81 MG tablet Take 81 mg by mouth daily.      . Calcium Carbonate-Vitamin D (CALCIUM-VITAMIN D) 600-200 MG-UNIT CAPS Take by mouth daily.        . chlorzoxazone (PARAFON) 500 MG tablet Take 500 mg by mouth as needed.        . Cholecalciferol (VITAMIN D3) 1000 UNITS CAPS Take by mouth daily.        [name] docusate sodium (COLACE) 100 MG capsule Take 100 mg by mouth as needed.       . etodolac (LODINE) 500 MG tablet Take 1 tablet (500 mg total) by mouth 2 (two) times daily.  180 tablet  3  . LORazepam (ATIVAN) 0.5 MG tablet Take 0.5 tablets (0.25 mg total) by mouth at bedtime as needed.  45 tablet  1  . pantoprazole (PROTONIX) 40 MG tablet Take 1 tablet (40 mg total) by mouth daily.  90 tablet  3  . PROBIOTIC CAPS Take 1 capsule by mouth daily.  100 capsule  3  . sucralfate (CARAFATE) 1 G tablet Take 1 tablet (1 g total) by mouth 2 (two) times daily.  180 tablet  3    No Known Allergies  Past Medical History  Diagnosis Date  . GERD (gastroesophageal reflux disease)   . Allergic rhinitis, cause unspecified   . Osteoarthrosis involving, or with mention of more than one site, but not specified as generalized, multiple sites   . Hyperlipidemia   . DVT (deep venous  thrombosis)   . Unspecified venous (peripheral) insufficiency   . Gastric ulcer     in past    Past Surgical History  Procedure Date  . Cholecystectomy 2000  . Breast biopsy 2003  . Tonsillectomy and adenoidectomy childhood  . Abdominal hysterectomy 1996  . Knee arthroscopy     right in 2007, left in 2011    Family History  Problem Relation Age of Onset  . Heart disease Mother   . Heart disease Father   . Diabetes Son 5    Type 1  . Hypertension Son   . Hyperlipidemia Neg Hx   . Depression Neg Hx   . Stroke Brother   . Diabetes Other 2    type 1    History   Social History  . Marital Status: Married    Spouse Name: N/A    Number of Children: 1  . Years of Education: N/A   Occupational History  . Dental hygienist     retired   Social History Main Topics  . Smoking status: Former Smoker    Types: Cigarettes    Quit date: 12/15/1990  . Smokeless tobacco: Never Used  . Alcohol Use: Yes     rare glass  of wine  . Drug Use: No  . Sexually Active: Not on file   Other Topics Concern  . Not on file   Social History Narrative   No living willAsks for husband to make decisions if neededWould accept resuscitation   Review of Systems No trouble walking No facial droop, diplopia, weakness, speech problems    Objective:   Physical Exam  Constitutional: She appears well-developed and well-nourished. No distress.  HENT:  Head: Normocephalic and atraumatic.  Right Ear: External ear normal.  Left Ear: External ear normal.  Mouth/Throat: Oropharynx is clear and moist. No oropharyngeal exudate.  Eyes: Conjunctivae and EOM are normal. Pupils are equal, round, and reactive to light.       Very mild horizontal nystagmus Fundi benign  Neck: Normal range of motion. Neck supple.  Musculoskeletal: She exhibits no edema and no tenderness.  Lymphadenopathy:    She has no cervical adenopathy.  Neurological: She is alert. She has normal strength. She displays no tremor. No  cranial nerve deficit or sensory deficit. She exhibits normal muscle tone. She displays a negative Romberg sign. Coordination and gait normal.          Assessment & Plan:

## 2012-11-23 ENCOUNTER — Other Ambulatory Visit: Payer: Self-pay | Admitting: Internal Medicine

## 2012-11-23 NOTE — Telephone Encounter (Signed)
Okay #45 x 0 

## 2012-11-23 NOTE — Telephone Encounter (Signed)
Medication phoned to [facility] as instructed.  

## 2012-11-29 ENCOUNTER — Encounter: Payer: Self-pay | Admitting: Internal Medicine

## 2012-12-01 ENCOUNTER — Encounter: Payer: Self-pay | Admitting: *Deleted

## 2012-12-16 ENCOUNTER — Encounter: Payer: Self-pay | Admitting: Family Medicine

## 2012-12-16 ENCOUNTER — Ambulatory Visit (INDEPENDENT_AMBULATORY_CARE_PROVIDER_SITE_OTHER): Payer: Medicare Other | Admitting: Family Medicine

## 2012-12-16 VITALS — BP 130/80 | HR 93 | Temp 97.8°F | Ht 67.0 in | Wt 199.0 lb

## 2012-12-16 DIAGNOSIS — J209 Acute bronchitis, unspecified: Secondary | ICD-10-CM

## 2012-12-16 MED ORDER — AZITHROMYCIN 250 MG PO TABS: ORAL_TABLET | ORAL | Status: DC

## 2012-12-16 MED ORDER — PREDNISONE 20 MG PO TABS: ORAL_TABLET | ORAL | Status: DC

## 2012-12-16 MED ORDER — ALBUTEROL SULFATE HFA 108 (90 BASE) MCG/ACT IN AERS: 2.0000 | INHALATION_SPRAY | Freq: Four times a day (QID) | RESPIRATORY_TRACT | Status: DC | PRN

## 2012-12-16 NOTE — Progress Notes (Signed)
  Subjective:    Patient ID: Alice Donaldson, female    DOB: [birth year], 67 y.o.   MRN: [id number]  Sinusitis This is a new problem. Episode onset: 12/06/2012. The problem has been gradually worsening since onset. Maximum temperature: subjective temperature. The fever has been present for 3 to 4 days. Associated symptoms include chills, congestion, coughing, sinus pressure and a sore throat. Pertinent negatives include no shortness of breath. Treatments tried: nyquil helps some. The treatment provided mild relief.   No asthma or chronic lung problems. Nonsmoker. She requests a Z-pack today.  In past, she has had some wheezing in past years given albuterol inhaler then.. Likely eactive airways.  Review of Systems  Constitutional: Positive for chills.  HENT: Positive for congestion, sore throat and sinus pressure.   Respiratory: Positive for cough. Negative for shortness of breath.        Objective:   Physical Exam  Constitutional: Vital signs are normal. She appears well-developed and well-nourished. She is cooperative.  Non-toxic appearance. She does not appear ill. No distress.  HENT:  Head: Normocephalic.  Right Ear: Hearing, tympanic membrane, external ear and ear canal normal. Tympanic membrane is not erythematous, not retracted and not bulging.  Left Ear: Hearing, tympanic membrane, external ear and ear canal normal. Tympanic membrane is not erythematous, not retracted and not bulging.  Nose: Mucosal edema and rhinorrhea present. Right sinus exhibits no maxillary sinus tenderness and no frontal sinus tenderness. Left sinus exhibits no maxillary sinus tenderness and no frontal sinus tenderness.  Mouth/Throat: Uvula is midline, oropharynx is clear and moist and mucous membranes are normal.  Eyes: Conjunctivae normal, EOM and lids are normal. Pupils are equal, round, and reactive to light. No foreign bodies found.  Neck: Trachea normal and normal range of motion. Neck supple. Carotid bruit  is not present. No mass and no thyromegaly present.  Cardiovascular: Normal rate, regular rhythm, S1 normal, S2 normal, normal heart sounds, intact distal pulses and normal pulses.  Exam reveals no gallop and no friction rub.   No murmur heard. Pulmonary/Chest: Effort normal. Not tachypneic. No respiratory distress. She has no decreased breath sounds. She has wheezes in the right upper field, the right middle field, the right lower field, the left upper field, the left middle field and the left lower field. She has no rhonchi. She has no rales.  Neurological: She is alert.  Skin: Skin is warm, dry and intact. No rash noted.  Psychiatric: Her speech is normal and behavior is normal. Judgment normal. Her mood appears not anxious. Cognition and memory are normal. She does not exhibit a depressed mood.          Assessment & Plan:

## 2012-12-16 NOTE — Patient Instructions (Addendum)
Complete prednisone taper and antibiotics.  Can use Mucinex DM for cough, but mainly use albuterol inhaler for wheeze/cough fits.  If not turning the corner in 48 hours call.  If severe shortness of breath go to ER.

## 2012-12-16 NOTE — Assessment & Plan Note (Addendum)
Peak flow 220, suggesting reactive airway disease.  Will treat with antibiotics as well as course of prednisone . Albuterol prn. Call if not improving as expected.

## 2012-12-21 ENCOUNTER — Ambulatory Visit (INDEPENDENT_AMBULATORY_CARE_PROVIDER_SITE_OTHER): Payer: Medicare Other | Admitting: Family Medicine

## 2012-12-21 ENCOUNTER — Encounter: Payer: Self-pay | Admitting: Family Medicine

## 2012-12-21 ENCOUNTER — Ambulatory Visit (INDEPENDENT_AMBULATORY_CARE_PROVIDER_SITE_OTHER)
Admission: RE | Admit: 2012-12-21 | Discharge: 2012-12-21 | Disposition: A | Discharge status: Home / Self Care | Payer: Medicare Other | Source: Ambulatory Visit | Attending: Family Medicine | Admitting: Family Medicine

## 2012-12-21 VITALS — BP 120/78 | HR 87 | Temp 97.8°F | Ht 67.0 in | Wt 233.8 lb

## 2012-12-21 DIAGNOSIS — J209 Acute bronchitis, unspecified: Secondary | ICD-10-CM

## 2012-12-21 DIAGNOSIS — R059 Cough, unspecified: Secondary | ICD-10-CM

## 2012-12-21 DIAGNOSIS — R05 Cough: Secondary | ICD-10-CM

## 2012-12-21 MED ORDER — MOXIFLOXACIN HCL 400 MG PO TABS: 400.0000 mg | ORAL_TABLET | Freq: Every day | ORAL | Status: DC

## 2012-12-21 MED ORDER — PREDNISONE 20 MG PO TABS: ORAL_TABLET | ORAL | Status: DC

## 2012-12-21 NOTE — Assessment & Plan Note (Signed)
Chest X-ray is clear on my read.. send to radiology.  Still with wheeze.. Will prolong prednisone taper and broaden antibiotics.

## 2012-12-21 NOTE — Patient Instructions (Addendum)
Broaden antibiotics to avelox. Repeat prednisone taper.  Call if not improving in next 72 hours.

## 2012-12-21 NOTE — Progress Notes (Signed)
  Subjective:    Patient ID: Alice Donaldson, female    DOB: 1946-12-10, 67 y.o.   MRN: [id number]  HPI 67 year old  Presents for follow up. She was seen for bronchitis and reactive airway disease  on 1/2 .[name] Started on Z-pack, prednisone taper and albuterol.  She returns today because she is not feeling  much better. She reports  she has wheezing off and on, SOB. Coughing less but still, productive greenish mucus. No fever, sweats resolved,  Minimal ear pain,  Minimal face pain.  No N/V.   Review of Systems  Constitutional: Negative for fever and fatigue.  HENT: Negative for ear pain.   Eyes: Negative for pain.  Respiratory: Negative for chest tightness and shortness of breath.   Cardiovascular: Negative for chest pain, palpitations and leg swelling.  Gastrointestinal: Negative for abdominal pain.  Genitourinary: Negative for dysuria.       Objective:   Physical Exam  Constitutional: Vital signs are normal. She appears well-developed and well-nourished. She is cooperative.  Non-toxic appearance. She does not appear ill. No distress.  HENT:  Head: Normocephalic.  Right Ear: Hearing, tympanic membrane, external ear and ear canal normal. Tympanic membrane is not erythematous, not retracted and not bulging.  Left Ear: Hearing, tympanic membrane, external ear and ear canal normal. Tympanic membrane is not erythematous, not retracted and not bulging.  Nose: Mucosal edema and rhinorrhea present. Right sinus exhibits no maxillary sinus tenderness and no frontal sinus tenderness. Left sinus exhibits no maxillary sinus tenderness and no frontal sinus tenderness.  Mouth/Throat: Uvula is midline, oropharynx is clear and moist and mucous membranes are normal.  Eyes: Conjunctivae normal, EOM and lids are normal. Pupils are equal, round, and reactive to light. No foreign bodies found.  Neck: Trachea normal and normal range of motion. Neck supple. Carotid bruit is not present. No mass and no thyromegaly  present.  Cardiovascular: Normal rate, regular rhythm, S1 normal, S2 normal, normal heart sounds, intact distal pulses and normal pulses.  Exam reveals no gallop and no friction rub.   No murmur heard. Pulmonary/Chest: Effort normal. Not tachypneic. No respiratory distress. She has no decreased breath sounds. She has wheezes in the right upper field, the right middle field, the right lower field, the left upper field, the left middle field and the left lower field. She has no rhonchi. She has no rales.  Neurological: She is alert.  Skin: Skin is warm, dry and intact. No rash noted.  Psychiatric: Her speech is normal and behavior is normal. Judgment normal. Her mood appears not anxious. Cognition and memory are normal. She does not exhibit a depressed mood.          Assessment & Plan:

## 2013-01-24 ENCOUNTER — Telehealth: Payer: Self-pay | Admitting: Internal Medicine

## 2013-01-24 NOTE — Telephone Encounter (Signed)
Let her know that I believe the aspirin probably does give her protection against another blood clot in a leg vein. The evidence is not clear that aspirin as as helpful to prevent heart attacks in women, but it does help prevent strokes. She should continue the aspirin and we can discuss it further at her next visit

## 2013-01-24 NOTE — Telephone Encounter (Signed)
Caller: Ishani/Patient; Phone: [phone]; Reason for Call: Patient is concerned about taking Aspirin for her history of blood clots.  She has heard now that Aspirin does not help women with this symptom.  She is wondering if she needs to change medications or to change the dosage of her Aspirin.  Please give her a call back regarding this question.  Patient said to leave a detailed message if she does not answer when someone calls.  Thanks.

## 2013-01-24 NOTE — Telephone Encounter (Signed)
Left detailed message on VM, advised pt to call if any questions or problems

## 2013-02-15 ENCOUNTER — Other Ambulatory Visit: Payer: Self-pay | Admitting: Family Medicine

## 2013-02-15 MED ORDER — PANTOPRAZOLE SODIUM 40 MG PO TBEC: 40.0000 mg | DELAYED_RELEASE_TABLET | Freq: Every day | ORAL | Status: DC

## 2013-02-15 NOTE — Telephone Encounter (Signed)
rx sent to pharmacy by e-script  

## 2013-02-15 NOTE — Telephone Encounter (Signed)
Pt states that she needs refill for protonix.  She says that Dr. Alphonsus Sias has not prescribed this before but he instructed her to call and let him know if she needed refills on anything prescribed before she was his pt.

## 2013-02-15 NOTE — Telephone Encounter (Signed)
Okay to refill for a year 

## 2013-04-12 ENCOUNTER — Encounter: Payer: Self-pay | Admitting: Internal Medicine

## 2013-04-12 ENCOUNTER — Ambulatory Visit (INDEPENDENT_AMBULATORY_CARE_PROVIDER_SITE_OTHER): Payer: Medicare Other | Admitting: Internal Medicine

## 2013-04-12 VITALS — BP 140/70 | HR 69 | Temp 98.2°F | Wt 238.0 lb

## 2013-04-12 DIAGNOSIS — Z780 Asymptomatic menopausal state: Secondary | ICD-10-CM

## 2013-04-12 DIAGNOSIS — I872 Venous insufficiency (chronic) (peripheral): Secondary | ICD-10-CM

## 2013-04-12 DIAGNOSIS — Z0000 Encounter for general adult medical examination without abnormal findings: Secondary | ICD-10-CM | POA: Insufficient documentation

## 2013-04-12 DIAGNOSIS — K219 Gastro-esophageal reflux disease without esophagitis: Secondary | ICD-10-CM

## 2013-04-12 DIAGNOSIS — E785 Hyperlipidemia, unspecified: Secondary | ICD-10-CM

## 2013-04-12 LAB — CBC WITH DIFFERENTIAL/PLATELET
Eosinophils Relative: 5.3 % — ABNORMAL HIGH (ref 0.0–5.0)
Lymphocytes Relative: 40.3 % (ref 12.0–46.0)
MCV: 92 fl (ref 78.0–100.0)
Monocytes Absolute: 0.7 10*3/uL (ref 0.1–1.0)
Monocytes Relative: 10.9 % (ref 3.0–12.0)
Neutrophils Relative %: 42.9 % — ABNORMAL LOW (ref 43.0–77.0)
Platelets: 216 10*3/uL (ref 150.0–400.0)
RBC: 4.92 Mil/uL (ref 3.87–5.11)
WBC: 6.8 10*3/uL (ref 4.5–10.5)

## 2013-04-12 LAB — BASIC METABOLIC PANEL
BUN: 14 mg/dL (ref 6–23)
Calcium: 9.5 mg/dL (ref 8.4–10.5)
Creatinine, Ser: 0.9 mg/dL (ref 0.4–1.2)
GFR: 66.94 mL/min (ref >60.00)

## 2013-04-12 LAB — HEPATIC FUNCTION PANEL
AST: 23 U/L (ref 0–37)
Bilirubin, Direct: 0 mg/dL (ref 0.0–0.3)
Total Bilirubin: 0.8 mg/dL (ref 0.3–1.2)

## 2013-04-12 LAB — LIPID PANEL
Cholesterol: 241 mg/dL — ABNORMAL HIGH (ref 0–200)
HDL: 34 mg/dL — ABNORMAL LOW (ref >39.00)
Triglycerides: 229 mg/dL — ABNORMAL HIGH (ref 0.0–149.0)
VLDL: 45.8 mg/dL — ABNORMAL HIGH (ref 0.0–40.0)

## 2013-04-12 LAB — TSH: TSH: 1.2 u[IU]/mL (ref 0.35–5.50)

## 2013-04-12 NOTE — Assessment & Plan Note (Signed)
Fine with the current meds Rarely uses the sucralfate

## 2013-04-12 NOTE — Progress Notes (Signed)
Subjective:    Patient ID: Alice Donaldson, female    DOB: [birth year], 67 y.o.   MRN: [id number]  HPI Here for physical UTD with all immunizations Will be due for colonoscopy next year--- Dr Marva Panda No Pap smear indicated.---had precancerous changes before hysterectomy in 1996 but no abnormality since then.  Vertigo is better but not gone Meclizine didn't really help--dayquil helped more Using zyrtec now for her allergies--seems to have helped Ongoing tinnitus when in bed at night and supine---has to take it slow when she gets out of bed  Stomach has been quiet Hasn't needed the sucralfate recently On weight watchers---increased fiber seems to have helped Had lost 20# but gained back 5 Tries to walk regularly---variable  Current Outpatient Prescriptions on File Prior to Visit  Medication Sig Dispense Refill  . albuterol (PROAIR HFA) 108 (90 BASE) MCG/ACT inhaler Inhale 2 puffs into the lungs every 6 (six) hours as needed for wheezing.  3.7 g  01  . aspirin 81 MG tablet Take 81 mg by mouth daily.      . Calcium Carbonate-Vitamin D (CALCIUM-VITAMIN D) 600-200 MG-UNIT CAPS Take by mouth daily.        . chlorzoxazone (PARAFON) 500 MG tablet Take 500 mg by mouth as needed.        . Cholecalciferol (VITAMIN D3) 1000 UNITS CAPS Take by mouth daily.        [name] docusate sodium (COLACE) 100 MG capsule Take 100 mg by mouth as needed.       . etodolac (LODINE) 500 MG tablet Take 1 tablet (500 mg total) by mouth 2 (two) times daily.  180 tablet  3  . LORazepam (ATIVAN) 0.5 MG tablet TAKE 1/2 TABLET BY MOUTH AT BEDTIME AS NEEDED  45 tablet  0  . pantoprazole (PROTONIX) 40 MG tablet Take 1 tablet (40 mg total) by mouth daily.  90 tablet  3  . PROBIOTIC CAPS Take 1 capsule by mouth daily.  100 capsule  3  . sucralfate (CARAFATE) 1 G tablet Take 1 tablet (1 g total) by mouth 2 (two) times daily.  180 tablet  3   No current facility-administered medications on file prior to visit.    No Known  Allergies  Past Medical History  Diagnosis Date  . GERD (gastroesophageal reflux disease)   . Allergic rhinitis, cause unspecified   . Osteoarthrosis involving, or with mention of more than one site, but not specified as generalized, multiple sites   . Hyperlipidemia   . DVT (deep venous thrombosis)   . Unspecified venous (peripheral) insufficiency   . Gastric ulcer     in past    Past Surgical History  Procedure Laterality Date  . Cholecystectomy  2000  . Breast biopsy  2003  . Tonsillectomy and adenoidectomy  childhood  . Abdominal hysterectomy  1996  . Knee arthroscopy      right in 2007, left in 2011    Family History  Problem Relation Age of Onset  . Heart disease Mother   . Heart disease Father   . Diabetes Son 5    Type 1  . Hypertension Son   . Hyperlipidemia Neg Hx   . Depression Neg Hx   . Stroke Brother   . Diabetes Other 2    type 1    History   Social History  . Marital Status: Married    Spouse Name: N/A    Number of Children: 1  . Years of Education: N/A  Occupational History  . Dental hygienist     retired   Social History Main Topics  . Smoking status: Former Smoker    Types: Cigarettes    Quit date: 12/15/1990  . Smokeless tobacco: Never Used  . Alcohol Use: Yes     Comment: rare glass of wine  . Drug Use: No  . Sexually Active: Not on file   Other Topics Concern  . Not on file   Social History Narrative   No living will   Asks for husband to make decisions if needed   Would accept resuscitation   Doesn't want tube feeds if cognitively unaware   Review of Systems  Constitutional: Negative for fatigue.       Wear seat belt  HENT: Positive for congestion, rhinorrhea and tinnitus. Negative for hearing loss and dental problem.        Regular with dentist  Eyes: Negative for visual disturbance.       No diplopia or unilateral vision loss  Respiratory: Negative for cough, chest tightness and shortness of breath.    Cardiovascular: Positive for palpitations and leg swelling. Negative for chest pain.       Occasional palpitation at rest Some right leg swelling at times--wears support hose on that leg all the time  Gastrointestinal: Negative for nausea, vomiting, abdominal pain, constipation and blood in stool.       Heartburn controlled on meds  Endocrine: Positive for heat intolerance. Negative for cold intolerance.  Genitourinary: Positive for frequency. Negative for difficulty urinating.       No sexual problems  Musculoskeletal: Positive for back pain and arthralgias. Negative for joint swelling.       Has aggravated back helping take care of husband after surgery Using the etodolac Rarely uses the chlorzoxazone  Skin: Negative for rash.       No suspicious lesions  Allergic/Immunologic: Positive for environmental allergies. Negative for immunocompromised state.       Zyrtec does help  Neurological: Negative for dizziness, syncope, weakness, light-headedness, numbness and headaches.  Hematological: Negative for adenopathy. Does not bruise/bleed easily.  Psychiatric/Behavioral: Negative for sleep disturbance and dysphoric mood. The patient is not nervous/anxious.        Sleeps okay with 1/4 lorazepam Doesn't use it during the day No significant memory problems       Objective:   Physical Exam  Constitutional: She is oriented to person, place, and time. She appears well-developed and well-nourished. No distress.  HENT:  Head: Normocephalic and atraumatic.  Right Ear: External ear normal.  Left Ear: External ear normal.  Mouth/Throat: Oropharynx is clear and moist. No oropharyngeal exudate.  Eyes: Conjunctivae and EOM are normal. Pupils are equal, round, and reactive to light.  Neck: Normal range of motion. Neck supple.  Thyroid palpable but not enlarged  Cardiovascular: Normal rate, regular rhythm, normal heart sounds and intact distal pulses.  Exam reveals no gallop.   No murmur  heard. Pulmonary/Chest: Effort normal and breath sounds normal. No respiratory distress. She has no wheezes. She has no rales.  Abdominal: Soft. There is no tenderness.  Musculoskeletal:  Thick but non pitting calves No calf tenderness  Lymphadenopathy:    She has no cervical adenopathy.  Neurological: She is alert and oriented to person, place, and time.  Skin: No rash noted. No erythema.  Psychiatric: She has a normal mood and affect. Her behavior is normal.          Assessment & Plan:

## 2013-04-12 NOTE — Assessment & Plan Note (Addendum)
Prefers to see gyn Colon due next year Discussed fitness Check DEXA---no more if normal. On vitamin D

## 2013-04-12 NOTE — Patient Instructions (Signed)
Please set up your gynecologic evaluation with Dr Arvil Chaco

## 2013-04-12 NOTE — Assessment & Plan Note (Signed)
Stable No signs of recurrent DVT---still wears hose on left

## 2013-04-12 NOTE — Assessment & Plan Note (Signed)
Myalgia with statin so no Rx indicated Wants to see if better with her improved diet

## 2013-04-13 LAB — LDL CHOLESTEROL, DIRECT: Direct LDL: 179.3 mg/dL

## 2013-04-14 HISTORY — PX: OTHER SURGICAL HISTORY: SHX169

## 2013-04-29 ENCOUNTER — Encounter: Payer: Self-pay | Admitting: Internal Medicine

## 2013-05-16 ENCOUNTER — Encounter: Payer: Self-pay | Admitting: Internal Medicine

## 2013-05-17 NOTE — Telephone Encounter (Signed)
Per verbal from Dr. Alphonsus Sias, bone density is normal

## 2013-05-17 NOTE — Telephone Encounter (Signed)
Results are scanned into her chart, please advise

## 2013-09-02 ENCOUNTER — Encounter: Payer: Self-pay | Admitting: Internal Medicine

## 2013-09-02 MED ORDER — ETODOLAC 500 MG PO TABS: 500.0000 mg | ORAL_TABLET | Freq: Two times a day (BID) | ORAL | Status: DC

## 2013-10-20 ENCOUNTER — Other Ambulatory Visit: Payer: Self-pay

## 2013-11-03 ENCOUNTER — Ambulatory Visit (INDEPENDENT_AMBULATORY_CARE_PROVIDER_SITE_OTHER): Payer: Medicare Other | Admitting: Internal Medicine

## 2013-11-03 ENCOUNTER — Encounter: Payer: Self-pay | Admitting: Internal Medicine

## 2013-11-03 VITALS — BP 140/80 | HR 81 | Temp 98.0°F | Wt 244.0 lb

## 2013-11-03 DIAGNOSIS — J019 Acute sinusitis, unspecified: Secondary | ICD-10-CM

## 2013-11-03 MED ORDER — PREDNISONE 20 MG PO TABS: 40.0000 mg | ORAL_TABLET | Freq: Every day | ORAL | Status: DC

## 2013-11-03 MED ORDER — AMOXICILLIN 500 MG PO TABS: 1000.0000 mg | ORAL_TABLET | Freq: Two times a day (BID) | ORAL | Status: DC

## 2013-11-03 NOTE — Progress Notes (Signed)
Subjective:    Patient ID: Alice Donaldson, female    DOB: 01-21-1946, 67 y.o.   MRN: [id number]  HPI Started being sick over a week ago with runny nose Husband also sick Then really felt worse ~5 days ago Has noted some wheezing--- refilled the albuterol and this helps (especially at night)  Then full in the head Throat burning and into chest Lots of cough--day and night Yellow sputum--now back to clear/white  No fever Has felt SOB-- when trying to do things (or at night) Able to sleep lying flat after the inhaler  Has used mucinex and robitussin  Current Outpatient Prescriptions on File Prior to Visit  Medication Sig Dispense Refill  . albuterol (PROAIR HFA) 108 (90 BASE) MCG/ACT inhaler Inhale 2 puffs into the lungs every 6 (six) hours as needed for wheezing.  3.7 g  01  . aspirin 81 MG tablet Take 81 mg by mouth daily.      . Biotin 1 MG CAPS Take by mouth daily.      . Calcium Carbonate-Vitamin D (CALCIUM-VITAMIN D) 600-200 MG-UNIT CAPS Take by mouth daily.        . chlorzoxazone (PARAFON) 500 MG tablet Take 500 mg by mouth as needed.        . Cholecalciferol (VITAMIN D3) 1000 UNITS CAPS Take by mouth daily.        [name] docusate sodium (COLACE) 100 MG capsule Take 100 mg by mouth as needed.       . etodolac (LODINE) 500 MG tablet Take 1 tablet (500 mg total) by mouth 2 (two) times daily.  180 tablet  3  . LORazepam (ATIVAN) 0.5 MG tablet TAKE 1/2 TABLET BY MOUTH AT BEDTIME AS NEEDED  45 tablet  0  . pantoprazole (PROTONIX) 40 MG tablet Take 1 tablet (40 mg total) by mouth daily.  90 tablet  3   No current facility-administered medications on file prior to visit.    No Known Allergies  Past Medical History  Diagnosis Date  . GERD (gastroesophageal reflux disease)   . Allergic rhinitis, cause unspecified   . Osteoarthrosis involving, or with mention of more than one site, but not specified as generalized, multiple sites   . Hyperlipidemia   . DVT (deep venous thrombosis)    . Unspecified venous (peripheral) insufficiency   . Gastric ulcer     in past    Past Surgical History  Procedure Laterality Date  . Cholecystectomy  2000  . Breast biopsy  2003  . Tonsillectomy and adenoidectomy  childhood  . Abdominal hysterectomy  1996  . Knee arthroscopy      right in 2007, left in 2011  . Dexa  5/14    Normal    Family History  Problem Relation Age of Onset  . Heart disease Mother   . Heart disease Father   . Diabetes Son 5    Type 1  . Hypertension Son   . Hyperlipidemia Neg Hx   . Depression Neg Hx   . Stroke Brother   . Diabetes Other 2    type 1    History   Social History  . Marital Status: Married    Spouse Name: N/A    Number of Children: 1  . Years of Education: N/A   Occupational History  . Dental hygienist     retired   Social History Main Topics  . Smoking status: Former Smoker    Types: Cigarettes    Quit date: 12/15/1990  .  Smokeless tobacco: Never Used  . Alcohol Use: Yes     Comment: rare glass of wine  . Drug Use: No  . Sexual Activity: Not on file   Other Topics Concern  . Not on file   Social History Narrative   No living will   Asks for husband to make decisions if needed   Would accept resuscitation   Doesn't want tube feeds if cognitively unaware   Review of Systems Jittery with the inhaler No rash No vomiting or diarrhea     Objective:   Physical Exam  Constitutional: She appears well-developed and well-nourished. No distress.  HENT:  No sinus tenderness Moderate nasal inflammation TMs normal Slight uvula injection  Neck: Normal range of motion. Neck supple. No thyromegaly present.  Pulmonary/Chest: Effort normal and breath sounds normal. No respiratory distress. She has no wheezes. She has no rales.  Lymphadenopathy:    She has no cervical adenopathy.          Assessment & Plan:

## 2013-11-03 NOTE — Assessment & Plan Note (Signed)
Seems to have secondary bacterial infection  Will treat with amoxil Prednisone for the chest component--- no clear wheezing but seems to have slight bronchospasm by history

## 2013-11-03 NOTE — Progress Notes (Signed)
Pre-visit discussion using our clinic review tool. No additional management support is needed unless otherwise documented below in the visit note.  

## 2013-11-06 ENCOUNTER — Encounter: Payer: Self-pay | Admitting: Internal Medicine

## 2013-11-07 MED ORDER — AMOXICILLIN-POT CLAVULANATE 875-125 MG PO TABS: 1.0000 | ORAL_TABLET | Freq: Two times a day (BID) | ORAL | Status: DC

## 2013-11-07 NOTE — Telephone Encounter (Signed)
.  left message to have patient return my call.  

## 2013-12-05 ENCOUNTER — Encounter: Payer: Self-pay | Admitting: Internal Medicine

## 2014-02-24 ENCOUNTER — Telehealth: Payer: Self-pay

## 2014-02-24 NOTE — Telephone Encounter (Signed)
You cannot catch shingles ( localized painful rash) from someone else but you can get chicken pox from someone with shingles. If she got chicken pox her symptoms would be diffuse rash ( blister on red background) fever, cough fatigue.  if she has not had chicken pox she should try to avoid close contact with  her neighbor until lesions are scabbed over.  No med needed.

## 2014-02-24 NOTE — Telephone Encounter (Signed)
Patient notified as instructed by telephone. 

## 2014-02-24 NOTE — Telephone Encounter (Signed)
Pt left v/m; pt's neighbor has just been diagnosed with shingles, no blisters but pt started med for shingles. Pt is around her neighbor a lot and wants to know if she could get shingles from her neighbor. Pt said she has never had chicken pox. Pt request cb.

## 2014-03-16 ENCOUNTER — Telehealth: Payer: Self-pay

## 2014-03-16 DIAGNOSIS — G8929 Other chronic pain: Secondary | ICD-10-CM

## 2014-03-16 DIAGNOSIS — M549 Dorsalgia, unspecified: Principal | ICD-10-CM

## 2014-03-16 NOTE — Telephone Encounter (Signed)
Pt left v/m requesting referral for pain mgt; Dr Daleen Squibb with [facility] due to chronic back pain. For 4 years back pain affecting pts quality of life and pt wants to get injections for back pain. Pt request cb.

## 2014-03-20 ENCOUNTER — Other Ambulatory Visit: Payer: Self-pay | Admitting: Internal Medicine

## 2014-03-20 NOTE — Telephone Encounter (Signed)
Okay #45 x 0

## 2014-03-20 NOTE — Telephone Encounter (Signed)
rx called into pharmacy

## 2014-03-20 NOTE — Telephone Encounter (Signed)
11/23/12 

## 2014-03-27 ENCOUNTER — Telehealth: Payer: Self-pay

## 2014-03-27 ENCOUNTER — Other Ambulatory Visit: Payer: Self-pay | Admitting: Internal Medicine

## 2014-03-27 NOTE — Telephone Encounter (Signed)
Pt left v/m requesting cb about lorazepam refill; spoke with Langley Gauss at [facility] and needs PA for lorazepam; [facility] CMA said sent PA for lorazepam on 03/23/14. Pt notified PA has been sent.

## 2014-03-31 NOTE — Telephone Encounter (Signed)
REferral faxed to [facility] for appt to be made. [place]

## 2014-04-07 ENCOUNTER — Other Ambulatory Visit: Payer: Self-pay | Admitting: Internal Medicine

## 2014-04-12 ENCOUNTER — Encounter: Payer: Medicare Other | Admitting: Internal Medicine

## 2014-04-14 ENCOUNTER — Ambulatory Visit (INDEPENDENT_AMBULATORY_CARE_PROVIDER_SITE_OTHER): Payer: Medicare Other | Admitting: Internal Medicine

## 2014-04-14 ENCOUNTER — Encounter: Payer: Self-pay | Admitting: Internal Medicine

## 2014-04-14 VITALS — BP 130/80 | HR 75 | Temp 97.8°F | Ht 67.0 in | Wt 254.0 lb

## 2014-04-14 DIAGNOSIS — K219 Gastro-esophageal reflux disease without esophagitis: Secondary | ICD-10-CM

## 2014-04-14 DIAGNOSIS — Z23 Encounter for immunization: Secondary | ICD-10-CM

## 2014-04-14 DIAGNOSIS — I872 Venous insufficiency (chronic) (peripheral): Secondary | ICD-10-CM

## 2014-04-14 DIAGNOSIS — E785 Hyperlipidemia, unspecified: Secondary | ICD-10-CM

## 2014-04-14 DIAGNOSIS — Z0000 Encounter for general adult medical examination without abnormal findings: Secondary | ICD-10-CM

## 2014-04-14 LAB — CBC WITH DIFFERENTIAL/PLATELET
BASOS PCT: 0.8 % (ref 0.0–3.0)
Basophils Absolute: 0 10*3/uL (ref 0.0–0.1)
EOS ABS: 0.3 10*3/uL (ref 0.0–0.7)
EOS PCT: 4.8 % (ref 0.0–5.0)
HCT: 42.7 % (ref 36.0–46.0)
HEMOGLOBIN: 14.4 g/dL (ref 12.0–15.0)
LYMPHS PCT: 38 % (ref 12.0–46.0)
Lymphs Abs: 2.2 10*3/uL (ref 0.7–4.0)
MCHC: 33.7 g/dL (ref 30.0–36.0)
MCV: 91.6 fl (ref 78.0–100.0)
Monocytes Absolute: 0.6 10*3/uL (ref 0.1–1.0)
Monocytes Relative: 10.7 % (ref 3.0–12.0)
NEUTROS ABS: 2.6 10*3/uL (ref 1.4–7.7)
Neutrophils Relative %: 45.7 % (ref 43.0–77.0)
Platelets: 244 10*3/uL (ref 150.0–400.0)
RBC: 4.66 Mil/uL (ref 3.87–5.11)
RDW: 13.3 % (ref 11.5–14.6)
WBC: 5.7 10*3/uL (ref 4.5–10.5)

## 2014-04-14 LAB — COMPREHENSIVE METABOLIC PANEL
ALBUMIN: 3.9 g/dL (ref 3.5–5.2)
ALT: 23 U/L (ref 0–35)
AST: 27 U/L (ref 0–37)
Alkaline Phosphatase: 117 U/L (ref 39–117)
BILIRUBIN TOTAL: 0.6 mg/dL (ref 0.3–1.2)
BUN: 11 mg/dL (ref 6–23)
CHLORIDE: 106 meq/L (ref 96–112)
CO2: 26 meq/L (ref 19–32)
Calcium: 9.6 mg/dL (ref 8.4–10.5)
Creatinine, Ser: 0.9 mg/dL (ref 0.4–1.2)
GFR: 68.52 mL/min (ref >60.00)
GLUCOSE: 89 mg/dL (ref 70–99)
POTASSIUM: 4.3 meq/L (ref 3.5–5.1)
SODIUM: 139 meq/L (ref 135–145)
TOTAL PROTEIN: 7.2 g/dL (ref 6.0–8.3)

## 2014-04-14 LAB — TSH: TSH: 0.85 u[IU]/mL (ref 0.35–5.50)

## 2014-04-14 LAB — LIPID PANEL
CHOL/HDL RATIO: 6
Cholesterol: 224 mg/dL — ABNORMAL HIGH (ref 0–200)
HDL: 39.1 mg/dL (ref >39.00)
LDL CALC: 147 mg/dL — AB (ref 0–99)
Triglycerides: 192 mg/dL — ABNORMAL HIGH (ref 0.0–149.0)
VLDL: 38.4 mg/dL (ref 0.0–40.0)

## 2014-04-14 LAB — T4, FREE: Free T4: 0.82 ng/dL (ref 0.60–1.60)

## 2014-04-14 NOTE — Addendum Note (Signed)
Addended by: Despina Hidden on: 04/14/2014 12:14 PM   Modules accepted: Orders

## 2014-04-14 NOTE — Assessment & Plan Note (Signed)
UTD with cancer screening Needs to work on fitness and better eating--hopefully can exercise again after [facility]

## 2014-04-14 NOTE — Assessment & Plan Note (Signed)
Intolerant of statin so will just check

## 2014-04-14 NOTE — Progress Notes (Signed)
Subjective:    Patient ID: Alice Donaldson, female    DOB: Elaria 16, 1947, 68 y.o.   MRN: [id number]  HPI Here for physical Reviewed advanced directives  Having problems with her back again Has appt with Dr Dossie Arbour  --hopes to get ESI again Has done [facility] therapy in past Has gained weight again due to inactivity Has not been eating as healthy-- frequent ice cream  Lots of stomach trouble Still on the protonix Had diarrhea with "cocktail" of her vitamin pills Still on the daily etodolac  Uses lorazepam at night prn Goes through phases  Current Outpatient Prescriptions on File Prior to Visit  Medication Sig Dispense Refill  . albuterol (PROAIR HFA) 108 (90 BASE) MCG/ACT inhaler Inhale 2 puffs into the lungs every 6 (six) hours as needed for wheezing.  3.7 g  01  . aspirin 81 MG tablet Take 81 mg by mouth daily.      [name] etodolac (LODINE) 500 MG tablet Take 1 tablet (500 mg total) by mouth 2 (two) times daily.  180 tablet  3  . LORazepam (ATIVAN) 0.5 MG tablet TAKE 1/2 TABLET BY MOUTH AT BEDTIME AS NEEDED  45 tablet  0  . pantoprazole (PROTONIX) 40 MG tablet TAKE 1 TABLET BY MOUTH EVERY DAY  30 tablet  0   No current facility-administered medications on file prior to visit.    No Known Allergies  Past Medical History  Diagnosis Date  . GERD (gastroesophageal reflux disease)   . Allergic rhinitis, cause unspecified   . Osteoarthrosis involving, or with mention of more than one site, but not specified as generalized, multiple sites   . Hyperlipidemia   . DVT (deep venous thrombosis)   . Unspecified venous (peripheral) insufficiency   . Gastric ulcer     in past    Past Surgical History  Procedure Laterality Date  . Cholecystectomy  2000  . Breast biopsy  2003  . Tonsillectomy and adenoidectomy  childhood  . Abdominal hysterectomy  1996  . Knee arthroscopy      right in 2007, left in 2011  . Dexa  5/14    Normal    Family History  Problem Relation Age of Onset  .  Heart disease Mother   . Heart disease Father   . Diabetes Son 5    Type 1  . Hypertension Son   . Hyperlipidemia Neg Hx   . Depression Neg Hx   . Stroke Brother   . Diabetes Other 2    type 1    History   Social History  . Marital Status: Married    Spouse Name: N/A    Number of Children: 1  . Years of Education: N/A   Occupational History  . Dental hygienist     retired   Social History Main Topics  . Smoking status: Former Smoker    Types: Cigarettes    Quit date: 12/15/1990  . Smokeless tobacco: Never Used  . Alcohol Use: Yes     Comment: rare glass of wine  . Drug Use: No  . Sexual Activity: Not on file   Other Topics Concern  . Not on file   Social History Narrative   No living will   Asks for husband to make decisions if needed   Would accept resuscitation   Doesn't want tube feeds if cognitively unaware   Review of Systems  Constitutional: Positive for fatigue and unexpected weight change.       Wears seat belt  HENT: Positive for tinnitus. Negative for dental problem and hearing loss.        Regular with dentist  Eyes: Negative for visual disturbance.       Some lightning flashes--exam negative No unilateral vision loss  Respiratory: Positive for shortness of breath. Negative for cough and chest tightness.        Expected DOE due to inactivity  Cardiovascular: Positive for chest pain and leg swelling. Negative for palpitations.       Occ jaw and left chest sensation while in bed  Gastrointestinal: Positive for diarrhea. Negative for nausea, vomiting and blood in stool.  Endocrine: Positive for heat intolerance. Negative for cold intolerance.  Genitourinary: Negative for dysuria, hematuria, difficulty urinating and dyspareunia.  Musculoskeletal: Positive for arthralgias and back pain. Negative for joint swelling.  Skin: Positive for rash.       Dry skin in ears Small patch on extensor right elbow  Allergic/Immunologic: Positive for environmental  allergies. Negative for immunocompromised state.       Zyrtec helps  Neurological: Negative for dizziness, syncope, weakness, light-headedness, numbness and headaches.  Hematological: Negative for adenopathy. Does not bruise/bleed easily.  Psychiatric/Behavioral: Positive for sleep disturbance and dysphoric mood. The patient is not nervous/anxious.        Objective:   Physical Exam  Constitutional: She is oriented to person, place, and time. She appears well-developed and well-nourished. No distress.  HENT:  Head: Normocephalic and atraumatic.  Right Ear: External ear normal.  Left Ear: External ear normal.  Mouth/Throat: Oropharynx is clear and moist. No oropharyngeal exudate.  Eyes: Conjunctivae and EOM are normal. Pupils are equal, round, and reactive to light.  Neck: Normal range of motion. Neck supple. No thyromegaly present.  Cardiovascular: Normal rate, regular rhythm, normal heart sounds and intact distal pulses.  Exam reveals no gallop.   No murmur heard. Pulmonary/Chest: Effort normal and breath sounds normal. No respiratory distress. She has no wheezes. She has no rales.  Abdominal: Soft. There is no tenderness.  Genitourinary:  Moderate cystic changes in periareolar areas in both breasts  Musculoskeletal: She exhibits no tenderness.  Thick calves bilat Some fluid in feet-- L>R  Lymphadenopathy:    She has no cervical adenopathy.    She has no axillary adenopathy.  Neurological: She is alert and oriented to person, place, and time.  Skin: No rash noted. No erythema.  Psychiatric: She has a normal mood and affect. Her behavior is normal.          Assessment & Plan:

## 2014-04-14 NOTE — Assessment & Plan Note (Signed)
Chronic GI problems Stays on PPI (esp with NSAID use)

## 2014-04-14 NOTE — Assessment & Plan Note (Signed)
Uses support hose on left Right now with some swelling as well

## 2014-04-14 NOTE — Progress Notes (Signed)
Pre visit review using our clinic review tool, if applicable. No additional management support is needed unless otherwise documented below in the visit note. 

## 2014-05-04 ENCOUNTER — Other Ambulatory Visit: Payer: Self-pay | Admitting: Internal Medicine

## 2014-05-11 ENCOUNTER — Ambulatory Visit: Payer: Self-pay | Admitting: Pain Medicine

## 2014-05-12 ENCOUNTER — Ambulatory Visit: Payer: Self-pay | Admitting: Pain Medicine

## 2014-05-12 LAB — HEPATIC FUNCTION PANEL A (ARMC)
ALK PHOS: 144 U/L — AB
AST: 30 U/L (ref 15–37)
Albumin: 3.4 g/dL (ref 3.4–5.0)
Bilirubin, Direct: 0.1 mg/dL (ref 0.00–0.20)
Bilirubin,Total: 0.5 mg/dL (ref 0.2–1.0)
SGPT (ALT): 26 U/L (ref 12–78)
Total Protein: 7.2 g/dL (ref 6.4–8.2)

## 2014-05-12 LAB — SEDIMENTATION RATE: ERYTHROCYTE SED RATE: 9 mm/h (ref 0–30)

## 2014-05-12 LAB — BASIC METABOLIC PANEL
Anion Gap: 3 — ABNORMAL LOW (ref 7–16)
BUN: 15 mg/dL (ref 7–18)
CALCIUM: 9.1 mg/dL (ref 8.5–10.1)
CREATININE: 1.03 mg/dL (ref 0.60–1.30)
Chloride: 111 mmol/L — ABNORMAL HIGH (ref 98–107)
Co2: 26 mmol/L (ref 21–32)
GFR CALC NON AF AMER: 55 — AB
Glucose: 123 mg/dL — ABNORMAL HIGH (ref 65–99)
OSMOLALITY: 282 (ref 275–301)
Potassium: 3.9 mmol/L (ref 3.5–5.1)
Sodium: 140 mmol/L (ref 136–145)

## 2014-05-12 LAB — MAGNESIUM: Magnesium: 1.9 mg/dL

## 2014-05-16 ENCOUNTER — Ambulatory Visit: Payer: Self-pay | Admitting: Pain Medicine

## 2014-05-29 ENCOUNTER — Ambulatory Visit: Payer: Self-pay | Admitting: Pain Medicine

## 2014-06-01 ENCOUNTER — Other Ambulatory Visit: Payer: Self-pay | Admitting: Internal Medicine

## 2014-06-05 ENCOUNTER — Telehealth: Payer: Self-pay

## 2014-06-05 NOTE — Telephone Encounter (Signed)
Pt said lab results from pain management were left a front desk last week for review of liver, kidney and pancreas testing. Pt request cb this afternoon.

## 2014-06-06 NOTE — Telephone Encounter (Signed)
Spoke with patient and advised results   

## 2014-06-06 NOTE — Telephone Encounter (Signed)
Please call her Her labs were all normal with me just under 2 months ago These labs are not worrisome Elevated glucose could be normal if not fasting. Chloride is borderline elevated---unclear significance Alkaline phosphatase also slightly elevated--- not sure what this means but not really concerning (and it was just normal at last testing) I don't think any follow up tests are needed for any of these slightly abnormal results

## 2014-06-13 ENCOUNTER — Ambulatory Visit: Payer: Self-pay | Admitting: Pain Medicine

## 2014-06-14 ENCOUNTER — Encounter: Payer: Self-pay | Admitting: Internal Medicine

## 2014-07-03 ENCOUNTER — Ambulatory Visit: Payer: Self-pay | Admitting: Pain Medicine

## 2014-07-06 ENCOUNTER — Telehealth: Payer: Self-pay | Admitting: *Deleted

## 2014-07-06 ENCOUNTER — Ambulatory Visit: Payer: Self-pay | Admitting: Pain Medicine

## 2014-07-06 MED ORDER — PROMETHAZINE HCL 25 MG PO TABS: 25.0000 mg | ORAL_TABLET | Freq: Three times a day (TID) | ORAL | Status: DC | PRN

## 2014-07-06 NOTE — Telephone Encounter (Signed)
Okay phenergan 25mg  #6 x 0 1 tid prn for nausea

## 2014-07-06 NOTE — Telephone Encounter (Signed)
rx sent to pharmacy by e-script Spoke with patient and advised results   

## 2014-07-06 NOTE — Telephone Encounter (Signed)
Patient calling asking for a rx for phenergan? Pt states she got an injection today at pain management and some of the injection "leaked out"? The pain dr. Hanley Seamen her rx for tramadol, hydromorphone, and ondansetron per pt the ondansetron is $175.00 and she can not afford that, pt would like rx for phenergan. The pain office has closed. Please advise

## 2014-07-24 ENCOUNTER — Telehealth: Payer: Self-pay

## 2014-07-24 NOTE — Telephone Encounter (Signed)
May need cardiac work up but asymptomatic now Will evaluate in AM

## 2014-07-24 NOTE — Telephone Encounter (Signed)
Pt said this afternoon pt experienced 20 min interval of sweating; clammy and wet all over body, internal shakiness, and fatigue. No symptoms now. For a few weeks upon any exertion pt experiences 10 -15 min episodes of sweating and iternal shaking. Pt has fatigue as well. No CP or SOB. Pt has H/A on and off. On 07/18/14 pt had episode of chest pressure. No chest pressure since then and not associated with sweating or shakiness. Pt is not on any med for diabetes, high BP or  Heart problems.Dr. Silvio Pate said canschedule appt on 07/25/14 but if any symptoms prior to appt pt to call 911. Pt voiced understanding and has appt with Dr Silvio Pate 07/25/14 at 11:15 AM.

## 2014-07-25 ENCOUNTER — Encounter: Payer: Self-pay | Admitting: Internal Medicine

## 2014-07-25 ENCOUNTER — Ambulatory Visit (INDEPENDENT_AMBULATORY_CARE_PROVIDER_SITE_OTHER): Payer: Medicare Other | Admitting: Internal Medicine

## 2014-07-25 VITALS — BP 120/80 | HR 81 | Temp 98.1°F | Wt 241.0 lb

## 2014-07-25 DIAGNOSIS — R0789 Other chest pain: Secondary | ICD-10-CM

## 2014-07-25 LAB — POCT CBG (FASTING - GLUCOSE)-MANUAL ENTRY: Glucose Fasting, POC: 64 mg/dL — AB (ref 70–99)

## 2014-07-25 NOTE — Patient Instructions (Signed)
Please call 911 if you have chest pressure or shortness of breath that doesn't resolve.

## 2014-07-25 NOTE — Assessment & Plan Note (Addendum)
Variable presentations with exertional symptoms being somewhat different than those at rest All are worrisome for coronary ischemia though with her moderate risks Some symptoms sound almost like hypoglycemic spells--- but not the exertional chest pain  EKG doesn't show any signs of ischemia Will go ahead and set up a standard stress test If positive, or negative with ongoing symptoms, will make referral to cardiologist Discussed concerns with etodolac, but since she had severe pain off it, will continue for now

## 2014-07-25 NOTE — Progress Notes (Signed)
Subjective:    Patient ID: Alice Donaldson, female    DOB: 06/11/1946, 68 y.o.   MRN: [id number]  HPI Has had some spells of chest pressure with diaphoresis These happen just about every time she tries to push it with walking Some DOE with going up stairs.needs to breath deep to calm down  Separate sensations at times-- pressure awakened her about a week ago. Pressure along upper mid chest to palate  Yesterday, she was just making a sandwich and she got sweaty and feeling of needing to eat something. Also with sensation of internal jitteriness. (also with internal heat sensation) Felt better after eating something Has had isolated diaphoresis spells  Occasional feelings that her heart is racing That goes back years though Only lasts a few seconds  Years ago had severe episode of chest pressure (4-10 years ago) Was seen by cardiologist (?Mas[place]) and had negative stress test  Tried off the etodolac for a while Arthritis all over got terrible--even trouble getting in and out of chair Uses daily  Current Outpatient Prescriptions on File Prior to Visit  Medication Sig Dispense Refill  . albuterol (PROAIR HFA) 108 (90 BASE) MCG/ACT inhaler Inhale 2 puffs into the lungs every 6 (six) hours as needed for wheezing.  3.7 g  01  . aspirin 81 MG tablet Take 81 mg by mouth daily.      . cetirizine (ZYRTEC) 10 MG tablet Take 10 mg by mouth daily.      [name] etodolac (LODINE) 500 MG tablet Take 1 tablet (500 mg total) by mouth 2 (two) times daily.  180 tablet  3  . LORazepam (ATIVAN) 0.5 MG tablet TAKE 1/2 TABLET BY MOUTH AT BEDTIME AS NEEDED  45 tablet  0  . pantoprazole (PROTONIX) 40 MG tablet TAKE 1 TABLET BY MOUTH EVERY DAY  30 tablet  11  . promethazine (PHENERGAN) 25 MG tablet Take 1 tablet (25 mg total) by mouth 3 (three) times daily as needed for nausea or vomiting.  6 tablet  0   No current facility-administered medications on file prior to visit.    No Known Allergies  Past Medical  History  Diagnosis Date  . GERD (gastroesophageal reflux disease)   . Allergic rhinitis, cause unspecified   . Osteoarthrosis involving, or with mention of more than one site, but not specified as generalized, multiple sites   . Hyperlipidemia   . DVT (deep venous thrombosis)   . Unspecified venous (peripheral) insufficiency   . Gastric ulcer     in past    Past Surgical History  Procedure Laterality Date  . Cholecystectomy  2000  . Breast biopsy  2003  . Tonsillectomy and adenoidectomy  childhood  . Abdominal hysterectomy  1996  . Knee arthroscopy      right in 2007, left in 2011  . Dexa  5/14    Normal    Family History  Problem Relation Age of Onset  . Heart disease Mother   . Heart disease Father   . Diabetes Son 5    Type 1  . Hypertension Son   . Hyperlipidemia Neg Hx   . Depression Neg Hx   . Stroke Brother   . Diabetes Other 2    type 1    History   Social History  . Marital Status: Married    Spouse Name: N/A    Number of Children: 1  . Years of Education: N/A   Occupational History  . Dental hygienist  retired   Social History Main Topics  . Smoking status: Former Smoker    Types: Cigarettes    Quit date: 12/15/1990  . Smokeless tobacco: Never Used  . Alcohol Use: Yes     Comment: rare glass of wine  . Drug Use: No  . Sexual Activity: Not on file   Other Topics Concern  . Not on file   Social History Narrative   No living will   Asks for husband to make decisions if needed   Would accept resuscitation   Doesn't want tube feeds if cognitively unaware   Review of Systems Still on protonix--controls reflux No swallowing problems Sleeping okay with gabapentin Has had series of 5 steroid injections on each side starting in [place] (Dr Dossie Arbour)    Objective:   Physical Exam  Constitutional: She appears well-developed and well-nourished. No distress.  Neck: Normal range of motion. Neck supple. No thyromegaly present.  Cardiovascular:  Normal rate, regular rhythm, normal heart sounds and intact distal pulses.  Exam reveals no gallop.   No murmur heard. Pulmonary/Chest: Effort normal and breath sounds normal. No respiratory distress. She has no wheezes. She has no rales.  Abdominal: Soft. There is no tenderness.  Musculoskeletal: She exhibits no edema and no tenderness.  Lymphadenopathy:    She has no cervical adenopathy.  Psychiatric: She has a normal mood and affect. Her behavior is normal.          Assessment & Plan:

## 2014-07-25 NOTE — Progress Notes (Signed)
Pre visit review using our clinic review tool, if applicable. No additional management support is needed unless otherwise documented below in the visit note. 

## 2014-08-02 ENCOUNTER — Encounter: Payer: Medicare Other | Admitting: Cardiovascular Disease

## 2014-08-03 ENCOUNTER — Ambulatory Visit (INDEPENDENT_AMBULATORY_CARE_PROVIDER_SITE_OTHER): Payer: Medicare Other | Admitting: Cardiovascular Disease

## 2014-08-03 ENCOUNTER — Encounter: Payer: Self-pay | Admitting: Cardiovascular Disease

## 2014-08-03 VITALS — BP 163/78 | HR 88 | Ht 68.0 in | Wt 243.5 lb

## 2014-08-03 DIAGNOSIS — R0789 Other chest pain: Secondary | ICD-10-CM

## 2014-08-03 NOTE — Procedures (Signed)
Exercise Treadmill Test  Treadmill ordered for recent epsiodes of chest pain.  Resting EKG shows NSR with rate of 88 bpm, no significant ST or T wave changes Resting blood pressure of 146/102 Stand bruce protocal was used.  Patient exercised for 3 min 00 sec,  Peak heart rate of 133 bpm.  This was 88% of the maximum predicted heart rate (151 bpm). Achieved 4.6 METS No symptoms of chest pain or lightheadedness were reported at peak stress or in recovery.  She did have shortness of breath and test was stopped Peak Blood pressure recorded was 163/78. Heart rate at 3 minutes in recovery was 89 bpm. No ST changes concerning for ischemia  FINAL IMPRESSION: Normal exercise stress test. No significant EKG changes concerning for ischemia. Poor exercise tolerance. Exercised for only 3 minutes. Test stopped secondary to shortness of breath.

## 2014-08-03 NOTE — Patient Instructions (Signed)
Your doctor will call with results

## 2014-08-09 ENCOUNTER — Ambulatory Visit: Payer: Self-pay | Admitting: Pain Medicine

## 2014-08-15 ENCOUNTER — Encounter: Payer: Medicare Other | Admitting: Internal Medicine

## 2014-08-29 ENCOUNTER — Ambulatory Visit: Payer: Self-pay | Admitting: Pain Medicine

## 2014-09-20 ENCOUNTER — Ambulatory Visit: Payer: Medicare Other

## 2014-10-26 ENCOUNTER — Other Ambulatory Visit: Payer: Self-pay | Admitting: Family Medicine

## 2014-10-26 ENCOUNTER — Telehealth: Payer: Self-pay | Admitting: Internal Medicine

## 2014-10-26 NOTE — Telephone Encounter (Signed)
Pt needs a refill on her inhaler.  The pharmacy has sent this by fax and has called [place].  Pt knows she has seen Dr. Diona Browner for this but Dr. Silvio Pate is her primary.  She is not sure if this is what the confusion has been but she needs her inhaler called in.  Please call pt at home.

## 2014-10-26 NOTE — Telephone Encounter (Signed)
Butch Penny refilled this at 2:12 this afternoon. Left message on cell VM for patient

## 2014-11-01 ENCOUNTER — Ambulatory Visit (INDEPENDENT_AMBULATORY_CARE_PROVIDER_SITE_OTHER): Payer: Medicare Other

## 2014-11-01 DIAGNOSIS — Z23 Encounter for immunization: Secondary | ICD-10-CM

## 2014-12-24 ENCOUNTER — Other Ambulatory Visit: Payer: Self-pay | Admitting: Internal Medicine

## 2014-12-25 NOTE — Telephone Encounter (Signed)
03/20/14 

## 2014-12-25 NOTE — Telephone Encounter (Signed)
rx called into pharmacy

## 2014-12-25 NOTE — Telephone Encounter (Signed)
Approved: #45 x 0 

## 2015-04-18 ENCOUNTER — Ambulatory Visit (INDEPENDENT_AMBULATORY_CARE_PROVIDER_SITE_OTHER): Payer: PPO | Admitting: Internal Medicine

## 2015-04-18 ENCOUNTER — Encounter: Payer: Self-pay | Admitting: Internal Medicine

## 2015-04-18 VITALS — BP 138/78 | HR 71 | Temp 98.4°F | Ht 68.0 in | Wt 249.0 lb

## 2015-04-18 DIAGNOSIS — K219 Gastro-esophageal reflux disease without esophagitis: Secondary | ICD-10-CM

## 2015-04-18 DIAGNOSIS — Z0000 Encounter for general adult medical examination without abnormal findings: Secondary | ICD-10-CM | POA: Diagnosis not present

## 2015-04-18 DIAGNOSIS — G8929 Other chronic pain: Secondary | ICD-10-CM

## 2015-04-18 DIAGNOSIS — R7301 Impaired fasting glucose: Secondary | ICD-10-CM | POA: Diagnosis not present

## 2015-04-18 DIAGNOSIS — I872 Venous insufficiency (chronic) (peripheral): Secondary | ICD-10-CM

## 2015-04-18 DIAGNOSIS — E785 Hyperlipidemia, unspecified: Secondary | ICD-10-CM | POA: Diagnosis not present

## 2015-04-18 DIAGNOSIS — M549 Dorsalgia, unspecified: Secondary | ICD-10-CM | POA: Diagnosis not present

## 2015-04-18 DIAGNOSIS — F39 Unspecified mood [affective] disorder: Secondary | ICD-10-CM

## 2015-04-18 LAB — CBC WITH DIFFERENTIAL/PLATELET
BASOS ABS: 0.1 10*3/uL (ref 0.0–0.1)
Basophils Relative: 0.9 % (ref 0.0–3.0)
EOS ABS: 0.3 10*3/uL (ref 0.0–0.7)
Eosinophils Relative: 5.7 % — ABNORMAL HIGH (ref 0.0–5.0)
HEMATOCRIT: 36.6 % (ref 36.0–46.0)
HEMOGLOBIN: 12.1 g/dL (ref 12.0–15.0)
LYMPHS PCT: 36.6 % (ref 12.0–46.0)
Lymphs Abs: 2.1 10*3/uL (ref 0.7–4.0)
MCHC: 33 g/dL (ref 30.0–36.0)
MCV: 82.5 fl (ref 78.0–100.0)
Monocytes Absolute: 0.7 10*3/uL (ref 0.1–1.0)
Monocytes Relative: 11.5 % (ref 3.0–12.0)
NEUTROS ABS: 2.6 10*3/uL (ref 1.4–7.7)
Neutrophils Relative %: 45.3 % (ref 43.0–77.0)
Platelets: 252 10*3/uL (ref 150.0–400.0)
RBC: 4.43 Mil/uL (ref 3.87–5.11)
RDW: 14.4 % (ref 11.5–15.5)
WBC: 5.8 10*3/uL (ref 4.0–10.5)

## 2015-04-18 LAB — COMPREHENSIVE METABOLIC PANEL
ALBUMIN: 4 g/dL (ref 3.5–5.2)
ALT: 16 U/L (ref 0–35)
AST: 19 U/L (ref 0–37)
Alkaline Phosphatase: 133 U/L — ABNORMAL HIGH (ref 39–117)
BUN: 15 mg/dL (ref 6–23)
CALCIUM: 9.6 mg/dL (ref 8.4–10.5)
CHLORIDE: 107 meq/L (ref 96–112)
CO2: 28 meq/L (ref 19–32)
Creatinine, Ser: 0.93 mg/dL (ref 0.40–1.20)
GFR: 63.26 mL/min (ref >60.00)
Glucose, Bld: 86 mg/dL (ref 70–99)
Potassium: 4.5 mEq/L (ref 3.5–5.1)
Sodium: 138 mEq/L (ref 135–145)
Total Bilirubin: 0.6 mg/dL (ref 0.2–1.2)
Total Protein: 7.1 g/dL (ref 6.0–8.3)

## 2015-04-18 LAB — T4, FREE: Free T4: 0.78 ng/dL (ref 0.60–1.60)

## 2015-04-18 LAB — LIPID PANEL
Cholesterol: 229 mg/dL — ABNORMAL HIGH (ref 0–200)
HDL: 43.6 mg/dL (ref >39.00)
LDL Cholesterol: 146 mg/dL — ABNORMAL HIGH (ref 0–99)
NonHDL: 185.4
TRIGLYCERIDES: 198 mg/dL — AB (ref 0.0–149.0)
Total CHOL/HDL Ratio: 5
VLDL: 39.6 mg/dL (ref 0.0–40.0)

## 2015-04-18 LAB — HEMOGLOBIN A1C: Hgb A1c MFr Bld: 6.1 % (ref 4.6–6.5)

## 2015-04-18 MED ORDER — ETODOLAC 500 MG PO TABS: 500.0000 mg | ORAL_TABLET | Freq: Two times a day (BID) | ORAL | Status: DC

## 2015-04-18 MED ORDER — PANTOPRAZOLE SODIUM 40 MG PO TBEC: 40.0000 mg | DELAYED_RELEASE_TABLET | Freq: Every day | ORAL | Status: DC

## 2015-04-18 NOTE — Assessment & Plan Note (Signed)
Past DVT post op Does okay with hose and elevation

## 2015-04-18 NOTE — Assessment & Plan Note (Signed)
For years Very limiting Uses the etodolac

## 2015-04-18 NOTE — Assessment & Plan Note (Signed)
Discussed lifestyle and weight loss

## 2015-04-18 NOTE — Assessment & Plan Note (Signed)
Okay on protonix

## 2015-04-18 NOTE — Assessment & Plan Note (Signed)
Dysthymia due to her own chronic pain Now with husband's stroke--she is more emotional Discussed MDD--if she has these symptoms she should be treated

## 2015-04-18 NOTE — Progress Notes (Signed)
Subjective:    Patient ID: Alice Donaldson, female    DOB: 1946/03/04, 69 y.o.   MRN: [id number]  HPI Here for Medicare wellness visit and follow up on chronic medical conditions Reviewed form and advanced directives Reviewed other physicians No tobacco or alcohol Hasn't been able to exercise---totally preoccupied with husband since his stroke No falls Mood issues due to limitations and husband's stroke Vision and hearing are okay Independent in instrumental ADLs No apparent cognitive decline  Ongoing back pain---hurts just to walk around the block Did PT for years All pain management with Dr Verne Grain water exercises--tolerated but not really helpful  Hasn't been needing the lorazepam Would only use them at night if she couldn't sleep  Takes the protonix nightly This controls heartburn No swallowing problems  Gets swelling in ankles at times Relates back to her DVT in past Still sees Dr Grayce Sessions at [facility] Uses compression hose and elevation  Known high cholesterol Had myalgias with simvastatin --very bad Concerned her levels will be higher with some weight gain  Sugar was elevated last time Tries to eat as healthy as possible--major stomach issues (diarrhea with salads, etc)  Current Outpatient Prescriptions on File Prior to Visit  Medication Sig Dispense Refill  . aspirin 81 MG tablet Take 81 mg by mouth daily.    . cetirizine (ZYRTEC) 10 MG tablet Take 10 mg by mouth daily.    [name] LORazepam (ATIVAN) 0.5 MG tablet TAKE 1/2 TABLET AT BEDTIME AS NEEDED 45 tablet 0  . PROAIR HFA 108 (90 BASE) MCG/ACT inhaler INHALE 2 PUFFS INTO THE LUNGS EVERY 6 (SIX) HOURS AS NEEDED FOR WHEEZING. 8.5 each 1   No current facility-administered medications on file prior to visit.    No Known Allergies  Past Medical History  Diagnosis Date  . GERD (gastroesophageal reflux disease)   . Allergic rhinitis, cause unspecified   . Osteoarthrosis involving, or with mention of more than  one site, but not specified as generalized, multiple sites   . Hyperlipidemia   . DVT (deep venous thrombosis)   . Unspecified venous (peripheral) insufficiency   . Gastric ulcer     in past    Past Surgical History  Procedure Laterality Date  . Cholecystectomy  2000  . Breast biopsy  2003  . Tonsillectomy and adenoidectomy  childhood  . Abdominal hysterectomy  1996  . Knee arthroscopy      right in 2007, left in 2011  . Dexa  5/14    Normal    Family History  Problem Relation Age of Onset  . Heart disease Mother   . Heart disease Father   . Diabetes Son 5    Type 1  . Hypertension Son   . Hyperlipidemia Neg Hx   . Depression Neg Hx   . Stroke Brother   . Diabetes Other 2    type 1    History   Social History  . Marital Status: Married    Spouse Name: N/A  . Number of Children: 1  . Years of Education: N/A   Occupational History  . Dental hygienist     retired   Social History Main Topics  . Smoking status: Former Smoker    Types: Cigarettes    Quit date: 12/15/1990  . Smokeless tobacco: Never Used  . Alcohol Use: Yes     Comment: rare glass of wine  . Drug Use: No  . Sexual Activity: Not on file   Other Topics Concern  .  Not on file   Social History Narrative   No living will   Asks for husband to make decisions-- but not sure he can after stroke. Son Joneen Caraway should now do this   Would accept resuscitation   Doesn't want tube feeds if cognitively unaware   Review of Systems Sleeps okay---and some more than usual. This is her escape Mood is off due to husband's stroke. Can't do anything due to his condition so not enjoying life Bowels are slow--chronic Voids okay Teeth are fine--sees dentist Wears seat belt    Objective:   Physical Exam  Constitutional: She is oriented to person, place, and time. She appears well-developed and well-nourished. No distress.  HENT:  Mouth/Throat: Oropharynx is clear and moist. No oropharyngeal exudate.  Neck:  Normal range of motion. Neck supple. No thyromegaly present.  Cardiovascular: Normal rate, regular rhythm, normal heart sounds and intact distal pulses.  Exam reveals no gallop.   No murmur heard. Pulmonary/Chest: Effort normal and breath sounds normal. No respiratory distress. She has no wheezes. She has no rales.  Abdominal: Soft. There is no tenderness.  Musculoskeletal: She exhibits no tenderness.  Puffy feet--especially right (since she dropped frozen [place] stew on it)  Lymphadenopathy:    She has no cervical adenopathy.  Neurological: She is alert and oriented to person, place, and time.  President-- "Obama, Clinton--- then [employer] [id number] Recall 2/3  Skin: No rash noted. No erythema.  Psychiatric: She has a normal mood and affect. Her behavior is normal.          Assessment & Plan:

## 2015-04-18 NOTE — Assessment & Plan Note (Signed)
Discussed primary prevention She is not excited about meds but may want this if it has gone up

## 2015-04-18 NOTE — Assessment & Plan Note (Signed)
I have personally reviewed the Medicare Annual Wellness questionnaire and have noted 1. The patient's medical and social history 2. Their use of alcohol, tobacco or illicit drugs 3. Their current medications and supplements 4. The patient's functional ability including ADL's, fall risks, home safety risks and hearing or visual             impairment. 5. Diet and physical activities 6. Evidence for depression or mood disorders  The patients weight, height, BMI and visual acuity have been recorded in the chart I have made referrals, counseling and provided education to the patient based review of the above and I have provided the pt with a written personalized care plan for preventive services.  I have provided you with a copy of your personalized plan for preventive services. Please take the time to review along with your updated medication list.  UTD on imms---just needs yearly flu shot Overdue for colonoscopy--she will call to schedule Mammogram due 12/16--she prefers no mammogram Discussed lifestyle--but she has limitations Mammogram due 12/16---- prefers no

## 2015-04-18 NOTE — Patient Instructions (Addendum)
Please call Dr Gustavo Lah about getting your follow up colonoscopy. You are due for your screening mammogram in December this year. You can set this up.

## 2015-04-18 NOTE — Progress Notes (Signed)
Pre visit review using our clinic review tool, if applicable. No additional management support is needed unless otherwise documented below in the visit note. 

## 2015-06-26 ENCOUNTER — Other Ambulatory Visit: Payer: Self-pay | Admitting: Internal Medicine

## 2015-06-29 ENCOUNTER — Other Ambulatory Visit: Payer: Self-pay

## 2015-06-29 MED ORDER — CHLORZOXAZONE 500 MG PO TABS: 500.0000 mg | ORAL_TABLET | Freq: Three times a day (TID) | ORAL | Status: DC | PRN

## 2015-06-29 NOTE — Telephone Encounter (Signed)
Pt left v/m requesting refill of chlorzoxazone 500 mg to [facility]; pt having flair up of back pain; rx never been filled by Dr Silvio Pate; Dr Margaretmary Eddy last filled med. Pt request cb. Pt last seen annual exam on 04/18/2015.

## 2015-06-29 NOTE — Telephone Encounter (Signed)
Approved: okay #[id number] x 0 1 tid prn

## 2015-06-29 NOTE — Telephone Encounter (Signed)
Med sent electronically as instructed to [place].Pt notified refill done.

## 2015-07-09 ENCOUNTER — Telehealth: Payer: Self-pay

## 2015-07-09 NOTE — Telephone Encounter (Signed)
Alice Donaldson with [facility] left v/m requesting status of prior auth for chlorzoxazone.Alice Donaldson request cb.

## 2015-07-09 NOTE — Telephone Encounter (Signed)
Spoke with patient to advise I just got info to request prior auth, pt states she's never took any other medication for muscle spasms. I spoke with the pharmacy and the med is $17.99 cash pay, but pt states she's never had a problem with getting the medication, previous dr's have given her this before, she is seeing someone at [facility] for her back problems and also seeing the pain management. Pt would like to proceed with prior auth. Prior Josem Kaufmann will be done thru cover my meds.   Prior auth sent 07/09/2015 12:56pm thru cover my meds

## 2015-07-11 NOTE — Telephone Encounter (Signed)
Received fax and called pt to ask a few questions. I first advised that this medication is considered a high risk medication for patient's 42 & older and do the benefits outweigh the risks for using this medicatio, per fax I received from [facility]. Pt stated is there something else that she could take that would not make her drowsy?per pt she has taken this medication for over 20 years off and on and she's never had a problem getting this. I advised that it's not Dr. Silvio Pate but her insurance and that Dr.Letvak has never prescribed this before.   (Dr. Lorelei Pont is there something else she can take?)  I have been trying to get this approved for about a week now. Please advise

## 2015-07-11 NOTE — Telephone Encounter (Signed)
i don't want to alter her chronic pain / back plan of care -   Non-urgent, and Dr. Silvio Pate should be able to decide if he wants to sub or change anything within a few days.

## 2015-07-12 NOTE — Telephone Encounter (Signed)
Let her know that the rules have changed and she will need to pay cash if she wants to continue this

## 2015-07-12 NOTE — Telephone Encounter (Signed)
.  left message to have patient return my call.  

## 2015-07-12 NOTE — Telephone Encounter (Signed)
We can go ahead with a PA We need to know what she has taken in the past though Let her know that many insurances have changed their procedures for medications on the "Beers list"-- a list of higher risk meds for those over 65 (she can look it up if she wants)

## 2015-07-12 NOTE — Telephone Encounter (Signed)
I did prior auth and it was denied ( letter in your box) because it was considered high risk.

## 2015-07-13 NOTE — Telephone Encounter (Signed)
Spoke with patient and she has never tried anything else. Pt states she just want something to help with muscle spasms. Please advise

## 2015-07-14 NOTE — Telephone Encounter (Signed)
If PA was denied, she is just going to have to pay for it.

## 2015-07-17 NOTE — Telephone Encounter (Signed)
Spoke with patient and she will pay cash for rx

## 2015-09-04 ENCOUNTER — Other Ambulatory Visit: Payer: Self-pay | Admitting: Internal Medicine

## 2015-09-19 ENCOUNTER — Ambulatory Visit (INDEPENDENT_AMBULATORY_CARE_PROVIDER_SITE_OTHER): Payer: PPO | Admitting: Internal Medicine

## 2015-09-19 ENCOUNTER — Encounter: Payer: Self-pay | Admitting: Internal Medicine

## 2015-09-19 VITALS — BP 120/80 | HR 90 | Wt 252.0 lb

## 2015-09-19 DIAGNOSIS — L723 Sebaceous cyst: Secondary | ICD-10-CM | POA: Diagnosis not present

## 2015-09-19 NOTE — Assessment & Plan Note (Signed)
Reassured her this is benign Discussed that it can be removed if it bothers her at all or gets inflamed

## 2015-09-19 NOTE — Progress Notes (Signed)
Subjective:    Patient ID: Alice Donaldson, female    DOB: 07/14/1946, 69 y.o.   MRN: [id number]  HPI Here due to mass behind right knee Noticed it 2 weeks ago Stable since then No pain  Chronic knee pain--past arthroscopy and still with arthritis Doesn't move--feels hard No drainage  Current Outpatient Prescriptions on File Prior to Visit  Medication Sig Dispense Refill  . aspirin 81 MG tablet Take 81 mg by mouth daily.    . cetirizine (ZYRTEC) 10 MG tablet Take 10 mg by mouth daily.    . chlorzoxazone (PARAFON) 500 MG tablet Take 1 tablet (500 mg total) by mouth 3 (three) times daily as needed. 30 tablet 0  . etodolac (LODINE) 500 MG tablet TAKE 1 TABLET (500 MG TOTAL) BY MOUTH 2 (TWO) TIMES DAILY. 180 tablet 2  . LORazepam (ATIVAN) 0.5 MG tablet TAKE 1/2 TABLET AT BEDTIME AS NEEDED 45 tablet 0  . pantoprazole (PROTONIX) 40 MG tablet TAKE 1 TABLET BY MOUTH EVERY DAY 90 tablet 3  . PROAIR HFA 108 (90 BASE) MCG/ACT inhaler INHALE 2 PUFFS INTO THE LUNGS EVERY 6 (SIX) HOURS AS NEEDED FOR WHEEZING. 8.5 each 1   No current facility-administered medications on file prior to visit.    No Known Allergies  Past Medical History  Diagnosis Date  . GERD (gastroesophageal reflux disease)   . Allergic rhinitis, cause unspecified   . Osteoarthrosis involving, or with mention of more than one site, but not specified as generalized, multiple sites   . Hyperlipidemia   . DVT (deep venous thrombosis) ([place])   . Unspecified venous (peripheral) insufficiency   . Gastric ulcer     in past    Past Surgical History  Procedure Laterality Date  . Cholecystectomy  2000  . Breast biopsy  2003  . Tonsillectomy and adenoidectomy  childhood  . Abdominal hysterectomy  1996  . Knee arthroscopy      right in 2007, left in 2011  . Dexa  5/14    Normal    Family History  Problem Relation Age of Onset  . Heart disease Mother   . Heart disease Father   . Diabetes Son 5    Type 1  . Hypertension  Son   . Hyperlipidemia Neg Hx   . Depression Neg Hx   . Stroke Brother   . Diabetes Other 2    type 1    Social History   Social History  . Marital Status: Married    Spouse Name: N/A  . Number of Children: 1  . Years of Education: N/A   Occupational History  . Dental hygienist     retired   Social History Main Topics  . Smoking status: Former Smoker    Types: Cigarettes    Quit date: 12/15/1990  . Smokeless tobacco: Never Used  . Alcohol Use: Yes     Comment: rare glass of wine  . Drug Use: No  . Sexual Activity: Not on file   Other Topics Concern  . Not on file   Social History Narrative   No living will   Asks for husband to make decisions-- but not sure he can after stroke. Son Joneen Caraway should now do this   Would accept resuscitation   Doesn't want tube feeds if cognitively unaware   Review of Systems No other masses or lumps Cold 2 weeks ago--better now Has rhinorrhea still and drainage. Some cough No fever    Objective:  Physical Exam  Musculoskeletal:  No knee effusions 10-18mm discrete firm cyst in right popliteal fossa          Assessment & Plan:

## 2015-09-19 NOTE — Progress Notes (Signed)
Pre visit review using our clinic review tool, if applicable. No additional management support is needed unless otherwise documented below in the visit note. 

## 2015-10-01 ENCOUNTER — Encounter: Payer: Self-pay | Admitting: *Deleted

## 2015-10-02 ENCOUNTER — Ambulatory Visit
Admission: RE | Admit: 2015-10-02 | Discharge: 2015-10-02 | Disposition: A | Discharge status: Home / Self Care | Payer: PPO | Source: Ambulatory Visit | Attending: Gastroenterology | Admitting: Gastroenterology

## 2015-10-02 ENCOUNTER — Ambulatory Visit: Payer: PPO | Admitting: Anesthesiology

## 2015-10-02 ENCOUNTER — Encounter
Admission: RE | Disposition: A | Discharge status: Home / Self Care | Payer: Self-pay | Source: Ambulatory Visit | Attending: Gastroenterology

## 2015-10-02 DIAGNOSIS — E785 Hyperlipidemia, unspecified: Secondary | ICD-10-CM | POA: Diagnosis not present

## 2015-10-02 DIAGNOSIS — M1990 Unspecified osteoarthritis, unspecified site: Secondary | ICD-10-CM | POA: Diagnosis not present

## 2015-10-02 DIAGNOSIS — K219 Gastro-esophageal reflux disease without esophagitis: Secondary | ICD-10-CM | POA: Diagnosis not present

## 2015-10-02 DIAGNOSIS — Z1211 Encounter for screening for malignant neoplasm of colon: Secondary | ICD-10-CM | POA: Diagnosis not present

## 2015-10-02 DIAGNOSIS — Z86718 Personal history of other venous thrombosis and embolism: Secondary | ICD-10-CM | POA: Insufficient documentation

## 2015-10-02 DIAGNOSIS — Z86010 Personal history of colonic polyps: Secondary | ICD-10-CM | POA: Diagnosis not present

## 2015-10-02 DIAGNOSIS — D122 Benign neoplasm of ascending colon: Secondary | ICD-10-CM | POA: Insufficient documentation

## 2015-10-02 DIAGNOSIS — Z7982 Long term (current) use of aspirin: Secondary | ICD-10-CM | POA: Insufficient documentation

## 2015-10-02 DIAGNOSIS — K644 Residual hemorrhoidal skin tags: Secondary | ICD-10-CM | POA: Insufficient documentation

## 2015-10-02 DIAGNOSIS — K648 Other hemorrhoids: Secondary | ICD-10-CM | POA: Insufficient documentation

## 2015-10-02 DIAGNOSIS — K5730 Diverticulosis of large intestine without perforation or abscess without bleeding: Secondary | ICD-10-CM | POA: Insufficient documentation

## 2015-10-02 DIAGNOSIS — I872 Venous insufficiency (chronic) (peripheral): Secondary | ICD-10-CM | POA: Diagnosis not present

## 2015-10-02 DIAGNOSIS — Z87891 Personal history of nicotine dependence: Secondary | ICD-10-CM | POA: Diagnosis not present

## 2015-10-02 DIAGNOSIS — Z79899 Other long term (current) drug therapy: Secondary | ICD-10-CM | POA: Diagnosis not present

## 2015-10-02 HISTORY — DX: Other specified postprocedural states: Z98.890

## 2015-10-02 HISTORY — DX: Nausea with vomiting, unspecified: R11.2

## 2015-10-02 HISTORY — PX: COLONOSCOPY WITH PROPOFOL: SHX5780

## 2015-10-02 HISTORY — DX: Other complications of anesthesia, initial encounter: T88.59XA

## 2015-10-02 HISTORY — DX: Adverse effect of unspecified anesthetic, initial encounter: T41.45XA

## 2015-10-02 SURGERY — COLONOSCOPY WITH PROPOFOL
Anesthesia: General

## 2015-10-02 MED ORDER — MIDAZOLAM HCL 5 MG/5ML IJ SOLN
INTRAMUSCULAR | Status: DC | PRN
  Administered 2015-10-02: 1 mg via INTRAVENOUS

## 2015-10-02 MED ORDER — SODIUM CHLORIDE 0.9 % IV SOLN: INTRAVENOUS | Status: DC

## 2015-10-02 MED ORDER — FENTANYL CITRATE (PF) 100 MCG/2ML IJ SOLN
INTRAMUSCULAR | Status: DC | PRN
  Administered 2015-10-02: 50 ug via INTRAVENOUS

## 2015-10-02 MED ORDER — LIDOCAINE HCL (PF) 2 % IJ SOLN
INTRAMUSCULAR | Status: DC | PRN
  Administered 2015-10-02: 50 mg

## 2015-10-02 MED ORDER — PROPOFOL 500 MG/50ML IV EMUL
INTRAVENOUS | Status: DC | PRN
  Administered 2015-10-02: 75 ug/kg/min via INTRAVENOUS

## 2015-10-02 MED ORDER — SODIUM CHLORIDE 0.9 % IV SOLN
INTRAVENOUS | Status: DC
  Administered 2015-10-02: 08:00:00 via INTRAVENOUS
  Administered 2015-10-02: 1000 mL via INTRAVENOUS

## 2015-10-02 MED ORDER — PROPOFOL 10 MG/ML IV BOLUS
INTRAVENOUS | Status: DC | PRN
  Administered 2015-10-02 (×3): 10 mg via INTRAVENOUS
  Administered 2015-10-02: 30 mg via INTRAVENOUS

## 2015-10-02 NOTE — Transfer of Care (Signed)
Immediate Anesthesia Transfer of Care Note  Patient: Alice Donaldson  Procedure(s) Performed: Procedure(s): COLONOSCOPY WITH PROPOFOL (N/A)  Patient Location: PACU  Anesthesia Type:General  Level of Consciousness: sedated  Airway & Oxygen Therapy: Patient Spontanous Breathing and Patient connected to nasal cannula oxygen  Post-op Assessment: Report given to RN and Post -op Vital signs reviewed and stable  Post vital signs: Reviewed and stable  Last Vitals:  Filed Vitals:   10/02/15 0732  BP: 126/89  Pulse: 84  Temp: 37 C  Resp: 20    Complications: No apparent anesthesia complications

## 2015-10-02 NOTE — H&P (Signed)
Outpatient short stay form Pre-procedure 10/02/2015 8:13 AM Lollie Sails MD  Primary Physician: Dr. Viviana Simpler  Reason for visit:  Colonoscopy  History of present illness:  personal history of colon polyps. Patient is a 69 year old female presenting today for a colonoscopy. She has a personal history of colon polyps. Her last colonoscopy was in January 2010. He was told she had a "kink in her intestine" and they had a difficult time doing the colonoscopy. She tolerated her prep well. She held her 81 mg aspirin and takes no other anticoagulation or aspirin medications.    Current facility-administered medications:  .  0.9 %  sodium chloride infusion, , Intravenous, Continuous, Lollie Sails, MD, Last Rate: 20 mL/hr at 10/02/15 0743, 1,000 mL at 10/02/15 0743 .  0.9 %  sodium chloride infusion, , Intravenous, Continuous, Lollie Sails, MD  Prescriptions prior to admission  Medication Sig Dispense Refill Last Dose  . pantoprazole (PROTONIX) 40 MG tablet TAKE 1 TABLET BY MOUTH EVERY DAY 90 tablet 3 10/02/2015 at 0600  . aspirin 81 MG tablet Take 81 mg by mouth daily.   Taking  . cetirizine (ZYRTEC) 10 MG tablet Take 10 mg by mouth daily.   Taking  . chlorzoxazone (PARAFON) 500 MG tablet Take 1 tablet (500 mg total) by mouth 3 (three) times daily as needed. 30 tablet 0 Taking  . etodolac (LODINE) 500 MG tablet TAKE 1 TABLET (500 MG TOTAL) BY MOUTH 2 (TWO) TIMES DAILY. 180 tablet 2 Taking  . LORazepam (ATIVAN) 0.5 MG tablet TAKE 1/2 TABLET AT BEDTIME AS NEEDED 45 tablet 0 Taking  . PROAIR HFA 108 (90 BASE) MCG/ACT inhaler INHALE 2 PUFFS INTO THE LUNGS EVERY 6 (SIX) HOURS AS NEEDED FOR WHEEZING. 8.5 each 1 Taking     No Known Allergies   Past Medical History  Diagnosis Date  . GERD (gastroesophageal reflux disease)   . Allergic rhinitis, cause unspecified   . Osteoarthrosis involving, or with mention of more than one site, but not specified as generalized, multiple sites    . Hyperlipidemia   . DVT (deep venous thrombosis) ([place])   . Unspecified venous (peripheral) insufficiency   . Gastric ulcer     in past  . Complication of anesthesia   . PONV (postoperative nausea and vomiting)     Review of systems:      Physical Exam    Heart and lungs: Regular rate and rhythm without rub or gallop lungs are bilaterally clear    HEENT: Normocephalic atraumatic eyes are anicteric    Other:     Pertinant exam for procedure: Soft nontender nondistended bowel sounds are positive normoactive    Planned proceedures: Colonoscopy and indicated procedures I have discussed the risks benefits and complications of procedures to include not limited to bleeding, infection, perforation and the risk of sedation and the patient wishes to proceed.    Lollie Sails, MD Gastroenterology 10/02/2015  8:13 AM

## 2015-10-02 NOTE — Op Note (Signed)
[facility] Gastroenterology Patient Name: Alice Donaldson Procedure Date: 10/02/2015 8:14 AM MRN: [id number] Account #: [id number] Date of Birth: 09/16/1946 Admit Type: Outpatient Age: 69 Room: [facility] ENDO ROOM 3 Gender: Female Note Status: Finalized Procedure:         Colonoscopy Indications:       Personal history of colonic polyps Providers:         Lollie Sails, MD Referring MD:      [name], MD (Referring MD) Medicines:         Monitored Anesthesia Care Complications:     No immediate complications. Procedure:         Pre-Anesthesia Assessment:                    - ASA Grade Assessment: III - A patient with severe                     systemic disease.                    After obtaining informed consent, the colonoscope was                     passed under direct vision. Throughout the procedure, the                     patient's blood pressure, pulse, and oxygen saturations                     were monitored continuously. The Colonoscope was                     introduced through the anus and advanced to the the cecum,                     identified by appendiceal orifice and ileocecal valve. The                     colonoscopy was unusually difficult due to a tortuous                     colon. Successful completion of the procedure was aided by                     changing the patient to a supine position, changing the                     patient to a prone position and using manual pressure. The                     quality of the bowel preparation was good. Findings:      Multiple small and large-mouthed diverticula were found in the sigmoid       colon, in the descending colon and in the transverse colon.      A 2 mm polyp was found in the descending colon. The polyp was sessile.       The polyp was removed with a cold biopsy forceps. Resection and       retrieval were complete.      A 7 mm polyp was found in the distal ascending colon. The  polyp was       sessile. The polyp was removed with a cold snare. Resection and       retrieval were complete.  Non-bleeding external and internal hemorrhoids were found during       anoscopy. The hemorrhoids were small.      The digital rectal exam was normal. Impression:        - Diverticulosis in the sigmoid colon, in the descending                     colon and in the transverse colon.                    - One 2 mm polyp in the descending colon. Resected and                     retrieved.                    - One 7 mm polyp in the distal ascending colon. Resected                     and retrieved.                    - Non-bleeding external and internal hemorrhoids. Recommendation:    - Discharge patient to home. Procedure Code(s): --- Professional ---                    [phone], Colonoscopy, flexible; with removal of tumor(s),                     polyp(s), or other lesion(s) by snare technique                    45380, 53, Colonoscopy, flexible; with biopsy, single or                     multiple Diagnosis Code(s): --- Professional ---                    211.3, Benign neoplasm of colon                    455.0, Internal hemorrhoids without mention of complication                    455.3, External hemorrhoids without mention of complication                    V12.72, Personal history of colonic polyps                    562.10, Diverticulosis of colon (without mention of                     hemorrhage) CPT copyright 2014 American Medical Association. All rights reserved. The codes documented in this report are preliminary and upon coder review may  be revised to meet current compliance requirements. Lollie Sails, MD 10/02/2015 8:55:33 AM This report has been signed electronically. Number of Addenda: 0 Note Initiated On: 10/02/2015 8:14 AM Scope Withdrawal Time: 0 hours 5 minutes 42 seconds  Total Procedure Duration: 0 hours 27 minutes 30 seconds       [facility]

## 2015-10-02 NOTE — Anesthesia Postprocedure Evaluation (Signed)
  Anesthesia Post-op Note  Patient: Alice Donaldson  Procedure(s) Performed: Procedure(s): COLONOSCOPY WITH PROPOFOL (N/A)  Anesthesia type:General  Patient location: PACU  Post pain: Pain level controlled  Post assessment: Post-op Vital signs reviewed, Patient's Cardiovascular Status Stable, Respiratory Function Stable, Patent Airway and No signs of Nausea or vomiting  Post vital signs: Reviewed and stable  Last Vitals:  Filed Vitals:   10/02/15 0920  BP: 135/87  Pulse: 72  Temp:   Resp: 18    Level of consciousness: awake, alert  and patient cooperative  Complications: No apparent anesthesia complications

## 2015-10-02 NOTE — Anesthesia Preprocedure Evaluation (Signed)
Anesthesia Evaluation  Patient identified by MRN, date of birth, ID band Patient awake    Reviewed: Allergy & Precautions, H&P , NPO status , Patient's Chart, lab work & pertinent test results  History of Anesthesia Complications (+) PONV and history of anesthetic complications  Airway Mallampati: III  TM Distance: >3 FB Neck ROM: limited    Dental  (+) Poor Dentition, Caps   Pulmonary neg shortness of breath, former smoker,    Pulmonary exam normal breath sounds clear to auscultation       Cardiovascular Exercise Tolerance: Good (-) angina+ Peripheral Vascular Disease  (-) Past MI and (-) DOE Normal cardiovascular exam Rhythm:regular Rate:Normal     Neuro/Psych PSYCHIATRIC DISORDERS negative neurological ROS     GI/Hepatic Neg liver ROS, PUD, GERD  Controlled and Medicated,  Endo/Other  negative endocrine ROS  Renal/GU negative Renal ROS  negative genitourinary   Musculoskeletal  (+) Arthritis ,   Abdominal   Peds  Hematology negative hematology ROS (+)   Anesthesia Other Findings Past Medical History:   GERD (gastroesophageal reflux disease)                       Allergic rhinitis, cause unspecified                         Osteoarthrosis involving, or with mention of m*              Hyperlipidemia                                               DVT (deep venous thrombosis) (H[facility])                           Unspecified venous (peripheral) insufficiency                Gastric ulcer                                                  Comment:in past   Complication of anesthesia                                   PONV (postoperative nausea and vomiting)                    Past Surgical History:   CHOLECYSTECTOMY                                  2000         BREAST BIOPSY                                    2003         TONSILLECTOMY AND ADENOIDECTOMY                  childhood    ABDOMINAL HYSTERECTOMY  1996         KNEE ARTHROSCOPY                                                Comment:right in 2007, left in 2011   DEXA                                             5/14           Comment:Normal  BMI    Body Mass Index   37.86 kg/m 2      Reproductive/Obstetrics negative OB ROS                             Anesthesia Physical Anesthesia Plan  ASA: III  Anesthesia Plan: General   Post-op Pain Management:    Induction:   Airway Management Planned:   Additional Equipment:   Intra-op Plan:   Post-operative Plan:   Informed Consent: I have reviewed the patients History and Physical, chart, labs and discussed the procedure including the risks, benefits and alternatives for the proposed anesthesia with the patient or authorized representative who has indicated his/her understanding and acceptance.   Dental Advisory Given  Plan Discussed with: Anesthesiologist, CRNA and Surgeon  Anesthesia Plan Comments:         Anesthesia Quick Evaluation

## 2015-10-03 ENCOUNTER — Encounter: Payer: Self-pay | Admitting: Gastroenterology

## 2015-10-03 LAB — SURGICAL PATHOLOGY

## 2016-02-07 ENCOUNTER — Telehealth: Payer: Self-pay | Admitting: Internal Medicine

## 2016-02-07 NOTE — Telephone Encounter (Signed)
Patient Name: Alice Donaldson DOB: [birth year] Initial Comment Caller States has bronchitis and been using inhaler and cough syrup. today she is vomiting. Nurse Assessment Nurse: Vallery Sa, RN, Cathy Date/Time (Eastern Time): 02/07/2016 1:05:39 PM Confirm and document reason for call. If symptomatic, describe symptoms. You must click the next button to save text entered. ---[name] states she was diagnosed with Bronchitis again about 3 days ago. No severe breathing difficulty. She developed chest pain pain today when coughing that she rates as a 7 on the 1 to 10 scale. Alert and responsive. She vomited today. Has the patient traveled out of the country within the last 30 days? ---No Does the patient have any new or worsening symptoms? ---Yes Will a triage be completed? ---Yes Related visit to physician within the last 2 weeks? ---Yes Does the PT have any chronic conditions? (i.e. diabetes, asthma, etc.) ---Yes List chronic conditions. ---Bronchitis Is this a behavioral health or substance abuse call? ---No Guidelines Guideline Title Affirmed Question Affirmed Notes Chest Pain SEVERE chest pain Final Disposition User Go to ED Now Vallery Sa, RN, Cathy Comments Caller declined the Go to Er disposition. Reinforced the Go to ER disposition Called the office backline and notified [facility] who states they will call [name] back. [name] notified. Referrals GO TO FACILITY REFUSED Disagree/Comply: Disagree Disagree/Comply Reason: Disagree with instructions

## 2016-02-07 NOTE — Telephone Encounter (Signed)
Spoke with Alice Donaldson; Alice Donaldson started with cough 02/04/16; Alice Donaldson was not diagnosed by physician; Alice Donaldson thinks she has bronchitis due to her symptoms; prod cough with clear phlegm, (robitussin helps somewhat) slight wheezing today,SOB and h/a. No fever,and inhaler helps the h/a. Alice Donaldson said she is not in distress when breathing. Alice Donaldson vomited x 1 earlier today after taking robitussin; no vomiting since. Alice Donaldson refused to go to UC and only wants to see Dr Silvio Pate; Alice Donaldson scheduled to see Dr Silvio Pate on 02/08/16 at 10:15 if Alice Donaldson condition changes or worsens prior to appt Alice Donaldson will go to UC.

## 2016-02-07 NOTE — Telephone Encounter (Signed)
Okay Sounds likely viral so okay to wait till tomorrow

## 2016-02-08 ENCOUNTER — Ambulatory Visit (INDEPENDENT_AMBULATORY_CARE_PROVIDER_SITE_OTHER)
Admission: RE | Admit: 2016-02-08 | Discharge: 2016-02-08 | Disposition: A | Discharge status: Home / Self Care | Payer: PPO | Source: Ambulatory Visit | Attending: Internal Medicine | Admitting: Internal Medicine

## 2016-02-08 ENCOUNTER — Ambulatory Visit (INDEPENDENT_AMBULATORY_CARE_PROVIDER_SITE_OTHER): Payer: PPO | Admitting: Internal Medicine

## 2016-02-08 ENCOUNTER — Encounter: Payer: Self-pay | Admitting: Internal Medicine

## 2016-02-08 VITALS — BP 126/70 | HR 90 | Temp 98.1°F | Resp 24 | Wt 247.0 lb

## 2016-02-08 DIAGNOSIS — R05 Cough: Secondary | ICD-10-CM | POA: Diagnosis not present

## 2016-02-08 DIAGNOSIS — R059 Cough, unspecified: Secondary | ICD-10-CM | POA: Insufficient documentation

## 2016-02-08 DIAGNOSIS — R051 Acute cough: Secondary | ICD-10-CM | POA: Insufficient documentation

## 2016-02-08 DIAGNOSIS — R0602 Shortness of breath: Secondary | ICD-10-CM | POA: Diagnosis not present

## 2016-02-08 NOTE — Assessment & Plan Note (Addendum)
With systemic symptoms ?attenuated flu CXR due to auscultatory abnormality Does have some LLL or lingular abnormality but doesn't look different than 2014 Will continue supportive care Antibiotic if radiologist thinks something is going on

## 2016-02-08 NOTE — Progress Notes (Signed)
Pre visit review using our clinic review tool, if applicable. No additional management support is needed unless otherwise documented below in the visit note. 

## 2016-02-08 NOTE — Progress Notes (Signed)
Subjective:    Patient ID: Alice Donaldson, female    DOB: [birth year], 70 y.o.   MRN: [id number]  HPI Here due to respiratory symptoms  "Its been rough" Started with pressure in chest 4 days ago Then affecting breathing and needed the proair Sweating but no clear fever Does have myalgias Some ear pain at first---left neck nodes tender also---this is better No sore throat  Started with vomiting yesterday--after eating or drinking Was able to eat a little again last night No abdominal pain  Tried robitussin and proair  Current Outpatient Prescriptions on File Prior to Visit  Medication Sig Dispense Refill  . aspirin 81 MG tablet Take 81 mg by mouth daily.    . cetirizine (ZYRTEC) 10 MG tablet Take 10 mg by mouth daily.    . chlorzoxazone (PARAFON) 500 MG tablet Take 1 tablet (500 mg total) by mouth 3 (three) times daily as needed. 30 tablet 0  . etodolac (LODINE) 500 MG tablet TAKE 1 TABLET (500 MG TOTAL) BY MOUTH 2 (TWO) TIMES DAILY. 180 tablet 2  . LORazepam (ATIVAN) 0.5 MG tablet TAKE 1/2 TABLET AT BEDTIME AS NEEDED 45 tablet 0  . pantoprazole (PROTONIX) 40 MG tablet TAKE 1 TABLET BY MOUTH EVERY DAY 90 tablet 3  . PROAIR HFA 108 (90 BASE) MCG/ACT inhaler INHALE 2 PUFFS INTO THE LUNGS EVERY 6 (SIX) HOURS AS NEEDED FOR WHEEZING. 8.5 each 1   No current facility-administered medications on file prior to visit.    No Known Allergies  Past Medical History  Diagnosis Date  . GERD (gastroesophageal reflux disease)   . Allergic rhinitis, cause unspecified   . Osteoarthrosis involving, or with mention of more than one site, but not specified as generalized, multiple sites   . Hyperlipidemia   . DVT (deep venous thrombosis) ([place])   . Unspecified venous (peripheral) insufficiency   . Gastric ulcer     in past  . Complication of anesthesia   . PONV (postoperative nausea and vomiting)     Past Surgical History  Procedure Laterality Date  . Cholecystectomy  2000  . Breast  biopsy  2003  . Tonsillectomy and adenoidectomy  childhood  . Abdominal hysterectomy  1996  . Knee arthroscopy      right in 2007, left in 2011  . Dexa  5/14    Normal  . Colonoscopy with propofol N/A 10/02/2015    Procedure: COLONOSCOPY WITH PROPOFOL;  Surgeon: Lollie Sails, MD;  Location[facility] ENDOSCOPY;  Service: Endoscopy;  Laterality: N/A;    Family History  Problem Relation Age of Onset  . Heart disease Mother   . Heart disease Father   . Diabetes Son 5    Type 1  . Hypertension Son   . Hyperlipidemia Neg Hx   . Depression Neg Hx   . Stroke Brother   . Diabetes Other 2    type 1    Social History   Social History  . Marital Status: Married    Spouse Name: N/A  . Number of Children: 1  . Years of Education: N/A   Occupational History  . Dental hygienist     retired   Social History Main Topics  . Smoking status: Former Smoker    Types: Cigarettes  . Smokeless tobacco: Never Used  . Alcohol Use: No     Comment: rare glass of wine  . Drug Use: No  . Sexual Activity: Not on file   Other Topics Concern  .  Not on file   Social History Narrative   No living will   Asks for husband to make decisions-- but not sure he can after stroke. Son Joneen Caraway should now do this   Would accept resuscitation   Doesn't want tube feeds if cognitively unaware   Review of Systems Appetite is off--barely eating anything No rash Did have flu shot    Objective:   Physical Exam  Constitutional:  Looks uncomfortable but no distress  HENT:  Mouth/Throat: No oropharyngeal exudate.  Moderate nasal swelling Pharynx slightly red TMs normal  Neck: Normal range of motion. Neck supple. No thyromegaly present.  Pulmonary/Chest: She has no rales.  ?upper airway wheezing ?dullness and decreased breath sounds at left base  Lymphadenopathy:    She has no cervical adenopathy.  Skin: No rash noted.          Assessment & Plan:

## 2016-02-27 ENCOUNTER — Telehealth: Payer: Self-pay | Admitting: Internal Medicine

## 2016-02-27 NOTE — Telephone Encounter (Signed)
Pt would like a call back about her chest xray results She cannot get into my chart

## 2016-02-27 NOTE — Telephone Encounter (Signed)
Spoke to patient. Advised her Dr Silvio Pate had said the xray was fine.

## 2016-03-03 ENCOUNTER — Ambulatory Visit: Payer: PPO | Attending: Pain Medicine | Admitting: Pain Medicine

## 2016-03-03 ENCOUNTER — Other Ambulatory Visit: Payer: Self-pay | Admitting: Pain Medicine

## 2016-03-03 ENCOUNTER — Encounter: Payer: Self-pay | Admitting: Pain Medicine

## 2016-03-03 VITALS — BP 134/78 | HR 92 | Temp 97.8°F | Resp 16 | Wt 246.0 lb

## 2016-03-03 DIAGNOSIS — M47816 Spondylosis without myelopathy or radiculopathy, lumbar region: Secondary | ICD-10-CM | POA: Diagnosis not present

## 2016-03-03 DIAGNOSIS — G8929 Other chronic pain: Secondary | ICD-10-CM | POA: Insufficient documentation

## 2016-03-03 DIAGNOSIS — Z87891 Personal history of nicotine dependence: Secondary | ICD-10-CM | POA: Diagnosis not present

## 2016-03-03 DIAGNOSIS — R21 Rash and other nonspecific skin eruption: Secondary | ICD-10-CM | POA: Diagnosis not present

## 2016-03-03 DIAGNOSIS — Z5181 Encounter for therapeutic drug level monitoring: Secondary | ICD-10-CM

## 2016-03-03 DIAGNOSIS — Z90710 Acquired absence of both cervix and uterus: Secondary | ICD-10-CM | POA: Insufficient documentation

## 2016-03-03 DIAGNOSIS — R0989 Other specified symptoms and signs involving the circulatory and respiratory systems: Secondary | ICD-10-CM | POA: Diagnosis not present

## 2016-03-03 DIAGNOSIS — Z9049 Acquired absence of other specified parts of digestive tract: Secondary | ICD-10-CM | POA: Diagnosis not present

## 2016-03-03 DIAGNOSIS — F1190 Opioid use, unspecified, uncomplicated: Secondary | ICD-10-CM

## 2016-03-03 DIAGNOSIS — Z79899 Other long term (current) drug therapy: Secondary | ICD-10-CM

## 2016-03-03 DIAGNOSIS — M7918 Myalgia, other site: Secondary | ICD-10-CM

## 2016-03-03 DIAGNOSIS — J309 Allergic rhinitis, unspecified: Secondary | ICD-10-CM | POA: Insufficient documentation

## 2016-03-03 DIAGNOSIS — M533 Sacrococcygeal disorders, not elsewhere classified: Secondary | ICD-10-CM | POA: Insufficient documentation

## 2016-03-03 DIAGNOSIS — K6289 Other specified diseases of anus and rectum: Secondary | ICD-10-CM | POA: Diagnosis not present

## 2016-03-03 DIAGNOSIS — F39 Unspecified mood [affective] disorder: Secondary | ICD-10-CM | POA: Insufficient documentation

## 2016-03-03 DIAGNOSIS — M545 Low back pain, unspecified: Secondary | ICD-10-CM

## 2016-03-03 DIAGNOSIS — H8110 Benign paroxysmal vertigo, unspecified ear: Secondary | ICD-10-CM | POA: Insufficient documentation

## 2016-03-03 DIAGNOSIS — M791 Myalgia: Secondary | ICD-10-CM | POA: Diagnosis not present

## 2016-03-03 DIAGNOSIS — E785 Hyperlipidemia, unspecified: Secondary | ICD-10-CM | POA: Insufficient documentation

## 2016-03-03 DIAGNOSIS — M549 Dorsalgia, unspecified: Secondary | ICD-10-CM | POA: Diagnosis not present

## 2016-03-03 DIAGNOSIS — Z7189 Other specified counseling: Secondary | ICD-10-CM

## 2016-03-03 DIAGNOSIS — K219 Gastro-esophageal reflux disease without esophagitis: Secondary | ICD-10-CM | POA: Insufficient documentation

## 2016-03-03 DIAGNOSIS — Z79891 Long term (current) use of opiate analgesic: Secondary | ICD-10-CM

## 2016-03-03 DIAGNOSIS — I872 Venous insufficiency (chronic) (peripheral): Secondary | ICD-10-CM | POA: Diagnosis not present

## 2016-03-03 DIAGNOSIS — R079 Chest pain, unspecified: Secondary | ICD-10-CM | POA: Diagnosis not present

## 2016-03-03 MED ORDER — ORPHENADRINE CITRATE 30 MG/ML IJ SOLN
INTRAMUSCULAR | Status: AC
  Administered 2016-03-03: 60 mg
  Filled 2016-03-03: qty 2

## 2016-03-03 MED ORDER — ROPIVACAINE HCL 2 MG/ML IJ SOLN: 9.0000 mL | Freq: Once | INTRAMUSCULAR | Status: DC

## 2016-03-03 MED ORDER — METHYLPREDNISOLONE ACETATE 80 MG/ML IJ SUSP
INTRAMUSCULAR | Status: AC
  Administered 2016-03-03: 16:00:00
  Filled 2016-03-03: qty 1

## 2016-03-03 MED ORDER — ROPIVACAINE HCL 2 MG/ML IJ SOLN
INTRAMUSCULAR | Status: AC
Start: 2016-03-03 — End: 2016-03-03
  Administered 2016-03-03: 16:00:00
  Filled 2016-03-03: qty 10

## 2016-03-03 MED ORDER — KETOROLAC TROMETHAMINE 60 MG/2ML IM SOLN
INTRAMUSCULAR | Status: AC
  Administered 2016-03-03: 60 mg via INTRAMUSCULAR
  Filled 2016-03-03: qty 2

## 2016-03-03 MED ORDER — METHYLPREDNISOLONE ACETATE 80 MG/ML IJ SUSP: 80.0000 mg | Freq: Once | INTRAMUSCULAR | Status: DC

## 2016-03-03 NOTE — Progress Notes (Signed)
Patient's Name: Alice Donaldson MRN: [id number] DOB: January 15, 1946 DOS: 03/03/2016  Primary Reason(s) for Visit: Evaluation of uncontrolled established, chronic problem CC: Rectal Pain   HPI  [name] is a 70 y.o. year old, female patient, who returns today as an established patient. She has GERD (gastroesophageal reflux disease); Allergic rhinitis, cause unspecified; Chronic back pain; Hyperlipidemia; Chronic venous insufficiency; BPPV (benign paroxysmal positional vertigo); Routine general medical examination at a health care facility; Chest pressure; Episodic mood disorder ([place]); Impaired fasting glucose; Sebaceous cyst; Cough; Vascular disorder of lower extremity; Chronic low back pain; Lumbar spondylosis; Lumbar facet syndrome; Chronic sacroiliac joint pain; Chronic pain; Long term current use of opiate analgesic; Long term prescription opiate use; Opiate use; Encounter for therapeutic drug level monitoring; Encounter for chronic pain management; and Chronic myofascial pain on her problem list.. Her primarily concern today is the Rectal Pain   The patient returns to the clinic today after last time being seen on 08/29/2014 eye which time the patient had a bilateral lumbar facet block under fluoroscopic guidance and IV sedation #3. Prior to that, on 07/06/2014 she underwent a diagnostic bilateral sacroiliac joint block under fluoroscopic guidance, no sedation. After this particular block, and after a short recovery period the patient was accompanied out to her car, but upon attempting to get into the car she started experiencing significant pain in her left SI joint area and was brought back to the recovery room where an IM injection of Toradol 60 mg and Norflex 60 mg was given to her along with eyes. She slowly recover from that, but she still remembers how painful the experience was. On 06/13/2014 the patient underwent her second bilateral lumbar facet block under fluoroscopic guidance and IV  sedation. On 05/16/2014 she had her first in a series of 3 diagnostic bilateral lumbar facet blocks under fluoroscopic guidance and IV sedation. The 3 diagnostic lumbar facet blocks provided her with significant relief of the pain however the pain did not completely ever go away. From the 3 injections she did attained 75-100% relief for the duration of local anesthetic, but no long-term benefit. Today she returns to see if there is anything that we can do for her persistent pain. The fact of the matter is that after completing her 08/29/2014 diagnostic block, we were planning on bringing her back for radiofrequency ablation. However, she did not return after the last injection.  Today she is having a flareup of her pain and therefore we have decided to give her an IM injection of 60 mg of Toradol and 60 mg of Norflex as well as a trigger point injection right over the left PSIS area. Hopefully this will provide her with some benefit. However, should the pain continue, we will have her come back for a diagnostic lumbar facet block and if effective we'll proceed that with the radiofrequency.  Pain Assessment: Self-Reported Pain Score: 7  Reported level is compatible with observation Pain Type: Chronic pain (has had series of injections to the back a couple years ago) Pain Location: Buttocks (has pain at base of spine since 1990's, "always had lower back pain") Pain Orientation: Left, Right (more on the left butt cheek) Pain Descriptors / Indicators: Aching, Constant, Sharp, Stabbing (unbearable pain when on your feet, or standing or walking) Pain Frequency: Constant  Last day of service: 08/29/2014 Service provided: Diagnostic bilateral lumbar facet block #3 under fluoroscopic guidance and IV sedation.  Controlled Substance Pharmacotherapy Assessment  Analgesic: We're currently not prescribing any opioids for  this patient. MME/day: 0  mg/day Pharmacokinetics: N/A Pharmacodynamics: N/A Monitoring: [place] PMP: The patient was last seen on 08/29/2014. UDS Results/interpretation: None available at this time. Medication Assessment Form: Her last appointment to see me was on 08/29/2014. Treatment compliance: Not applicable. Initial evaluation Risk Assessment: Aberrant Behavior: None observed today Substance Use Disorder (SUD) Risk Level: Low Opioid Risk Tool (ORT) Score: Total Score: 0 Low Risk for SUD (Score <3) Depression Scale Score: PHQ-2: PHQ-2 Total Score: 3 38.4% Probability of major depressive disorder (3) PHQ-9: PHQ-9 Total Score: 11 Moderate depression (10-14)  Pharmacologic Plan: Were not adding any opioids at this time.   Laboratory Workup  Last ED UDS: No results found for: THCU, COCAINSCRNUR, PCPSCRNUR, MDMA, AMPHETMU, METHADONE, ETOH  Inflammation Markers Lab Results  Component Value Date   ESRSEDRATE 9 05/12/2014    Renal Function Lab Results  Component Value Date   BUN 15 04/18/2015   CREATININE 0.93 04/18/2015   GFRAA >60 05/12/2014   GFRNONAA 55* 05/12/2014    Hepatic Function Lab Results  Component Value Date   AST 19 04/18/2015   ALT 16 04/18/2015   ALBUMIN 4.0 04/18/2015    Electrolytes Lab Results  Component Value Date   NA 138 04/18/2015   K 4.5 04/18/2015   CL 107 04/18/2015   CALCIUM 9.6 04/18/2015   MG 1.9 05/12/2014    Allergies  [name] is allergic to prevpac and latex.  Meds  The patient has a current medication list which includes the following prescription(s): aspirin, aspirin, cetirizine, chlorzoxazone, cholecalciferol, docusate sodium, etodolac, glucosamine chondroitin joint, ibuprofen, lorazepam, pantoprazole, polyethylene glycol powder, and proair hfa, and the following Facility-Administered Medications: methylprednisolone acetate and ropivacaine (pf) 2 mg/ml (0.2%).  Current Outpatient Prescriptions on File Prior to Visit  Medication Sig  . aspirin 81  MG tablet Take 81 mg by mouth daily. Reported on 03/03/2016  . cetirizine (ZYRTEC) 10 MG tablet Take 10 mg by mouth daily.  . chlorzoxazone (PARAFON) 500 MG tablet Take 1 tablet (500 mg total) by mouth 3 (three) times daily as needed. (Patient taking differently: Take 500 mg by mouth as needed. )  . etodolac (LODINE) 500 MG tablet TAKE 1 TABLET (500 MG TOTAL) BY MOUTH 2 (TWO) TIMES DAILY.  [name] LORazepam (ATIVAN) 0.5 MG tablet TAKE 1/2 TABLET AT BEDTIME AS NEEDED  . pantoprazole (PROTONIX) 40 MG tablet TAKE 1 TABLET BY MOUTH EVERY DAY  . PROAIR HFA 108 (90 BASE) MCG/ACT inhaler INHALE 2 PUFFS INTO THE LUNGS EVERY 6 (SIX) HOURS AS NEEDED FOR WHEEZING.   No current facility-administered medications on file prior to visit.    ROS  Constitutional: Afebrile, no chills, well hydrated and well nourished Gastrointestinal: negative Musculoskeletal:negative Neurological: negative Behavioral/Psych: negative  PFSH  Medical:  Ms. Hunsley  has a past medical history of GERD (gastroesophageal reflux disease); Allergic rhinitis, cause unspecified; Osteoarthrosis involving, or with mention of more than one site, but not specified as generalized, multiple sites; Hyperlipidemia; DVT (deep venous thrombosis) ([place]); Unspecified venous (peripheral) insufficiency; Gastric ulcer; Complication of anesthesia; PONV (postoperative nausea and vomiting); and Allergy. Family: family history includes Diabetes (age of onset: 2) in her other; Diabetes (age of onset: 5) in her son; Heart disease in her father and mother; Hypertension in her son; Stroke in her brother. There is no history of Hyperlipidemia or Depression. Surgical:  has past surgical history that includes Cholecystectomy (2000); Breast biopsy (2003); Tonsillectomy and adenoidectomy (childhood); Abdominal hysterectomy (1996); Knee arthroscopy; DEXA (5/14); and Colonoscopy with propofol (  N/A, 10/02/2015). Tobacco:  reports that she has quit smoking. Her smoking use  included Cigarettes. She has never used smokeless tobacco. Alcohol:  reports that she does not drink alcohol. Drug:  reports that she does not use illicit drugs.  Physical Exam  Vitals:  Today's Vitals   03/03/16 1348 03/03/16 1352 03/03/16 1624 03/03/16 1627  BP: 132/68  110/86 134/78  Pulse: 90  94 92  Temp: 97.8 F (36.6 C)     TempSrc: Oral     Resp: 16  16 16   Weight: 246 lb (111.585 kg)     SpO2: 98%  98% 98%  PainSc: 7  7   7    PainLoc: Buttocks       Calculated BMI: Body mass index is 37.41 kg/(m^2). Severe obesity (Class II) (35-39.9 kg/m2) - 136% higher incidence of chronic pain  General appearance: alert, cooperative, appears stated age, mild distress and morbidly obese Eyes: PERLA Respiratory: No evidence respiratory distress, no audible rales or ronchi and no use of accessory muscles of respiration   Procedure:  Type: Therapeutic Trigger Point Injection Region: Lower  Lumbar area. Level: Lumbosacral Level(s) Laterality: Left-Sided Paravertebral  Indications: Myofascial pain syndrome of the left lower back (PSIS area)  Description of Procedure Process:  Time-out: "Time-out" completed before starting procedure, as per protocol. Position: As per procedure #1. Target Area: Myofascial Trigger Point Approach: Direct percutaneous approach. Area Prepped: Entire Posterior Lumbosacral Region Prepping solution: ChloraPrep Safety Precautions: Aspiration looking for blood return was conducted prior to all injections. At no point did we inject any substances, as a needle was being advanced. No attempts were made at seeking any paresthesias. Safe injection practices and needle disposal techniques used. Medications properly checked for expiration dates. SDV (single dose vial) medications used. Latex Allergy precautions taken. Description of the Procedure: The trigger area was identified and marked. Prepping solution applied. Target area infiltrated with 1% lidocaine.  Appropriate amount of time allowed to pass for local anesthetics to take effect. The procedure needle was then advanced to the target area.  Negative aspiration confirmed. Solution injected in intermittent fashion. The needle was then removed and the area cleansed. EBL: Minimal Materials & Medications Used:  Needle(s) Used: 25g - 1.5" Needle(s) Solution Injected: 0.2% PF-Ropivacaine (32ml) + SDV-DepoMedrol 80 mg/ml (27ml)  Imaging Guidance:  Type of Imaging Technique: None Indication(s): N/A  Assessment & Plan  Primary Diagnosis & Pertinent Problem List: The primary encounter diagnosis was Chronic low back pain. Diagnoses of Lumbar spondylosis, unspecified spinal osteoarthritis, Lumbar facet syndrome, Chronic sacroiliac joint pain, Chronic pain, Long term current use of opiate analgesic, Long term prescription opiate use, Opiate use, Encounter for therapeutic drug level monitoring, Encounter for chronic pain management, and Chronic myofascial pain were also pertinent to this visit.  Visit Diagnosis: 1. Chronic low back pain   2. Lumbar spondylosis, unspecified spinal osteoarthritis   3. Lumbar facet syndrome   4. Chronic sacroiliac joint pain   5. Chronic pain   6. Long term current use of opiate analgesic   7. Long term prescription opiate use   8. Opiate use   9. Encounter for therapeutic drug level monitoring   10. Encounter for chronic pain management   11. Chronic myofascial pain     Problem-specific Plan(s): No problem-specific assessment & plan notes found for this encounter.   Plan of Care  Pharmacotherapy (Medications Ordered): Meds ordered this encounter  Medications  . methylPREDNISolone acetate (DEPO-MEDROL) injection 80 mg    Sig:   .  ropivacaine (PF) 2 mg/ml (0.2%) (NAROPIN) epidural 9 mL    Sig:   . orphenadrine (NORFLEX) 30 MG/ML injection    Sig:     Florene Glen, Patti: cabinet override  . ketorolac (TORADOL) 60 MG/2ML injection    Sig:     Florene Glen, Patti:  cabinet override  . ropivacaine (PF) 2 mg/ml (0.2%) (NAROPIN) 2 MG/ML epidural    Sig:     Donneta Romberg, Dena: cabinet override  . methylPREDNISolone acetate (DEPO-MEDROL) 80 MG/ML injection    Sig:     Donneta Romberg, Dena: cabinet override    Lab-work & Procedure Ordered: Orders Placed This Encounter  Procedures  . LUMBAR FACET(MEDIAL BRANCH NERVE BLOCK) MBNB    Standing Status: Future     Number of Occurrences:      Standing Expiration Date: 03/03/2017    Scheduling Instructions:     Side: Left-sided     Level: L2, L3, L4, L5, & S1 Medial Branch Nerve     Sedation: With Sedation.     Timeframe: ASAA    Order Specific Question:  Where will this procedure be performed?    Answer:  [facility]  . TRIGGER POINT INJECTION    Scheduling Instructions:     Area: Buttocks     Side: Left     Sedation: None     Timeframe: Today    Order Specific Question:  Where will this procedure be performed?    Answer:  [facility]  . ToxASSURE Select 13 (MW), Urine    Imaging Ordered: None  Interventional Therapies: Scheduled: Diagnostic, bilateral, lumbar facet block under fluoroscopic guidance and IV sedation. PRN Procedures: Consider bilateral sacroiliac joint block under fluoroscopic guidance.    Referral(s) or Consult(s): None at this point.  Medications administered during this visit: We administered orphenadrine, ketorolac, ropivacaine (PF) 2 mg/ml (0.2%), and methylPREDNISolone acetate.  Future Appointments Date Time Provider Amanda  05/06/2016 10:30 AM [name], MD [facility]    Primary Care Physician: Viviana Simpler, MD Location: [facility] Note by: Kathlen Brunswick. Dossie Arbour, M.D, DABA, DABAPM, DABPM, DABIPP, FIPP  Pain Score Disclaimer: We use the NRS-11 scale. This is a self-reported, subjective measurement of pain severity with only modest accuracy. It is used primarily to identify changes within a  particular patient. It must be understood that outpatient pain scales are significantly less accurate that those used for research, where they can be applied under ideal controlled circumstances with minimal exposure to variables. In reality, the score is likely to be a combination of pain intensity and pain affect, where pain affect describes the degree of emotional arousal or changes in action readiness caused by the sensory experience of pain. Factors such as social and work situation, setting, emotional state, anxiety levels, expectation, and prior pain experience may influence pain perception and show large inter-individual differences that may also be affected by time variables.

## 2016-03-03 NOTE — Progress Notes (Signed)
Safety precautions to be maintained throughout the outpatient stay will include: orient to surroundings, keep bed in low position, maintain call bell within reach at all times, provide assistance with transfer out of bed and ambulation.  

## 2016-03-03 NOTE — Patient Instructions (Signed)
Pain Management Discharge Instructions  General Discharge Instructions :  If you need to reach your doctor call: Monday-Friday 8:00 am - 4:00 pm at [phone] or toll free [phone].  After clinic hours [phone] to have operator reach doctor.  Bring all of your medication bottles to all your appointments in the pain clinic.  To cancel or reschedule your appointment with Pain Management please remember to call 24 hours in advance to avoid a fee.  Refer to the educational materials which you have been given on: General Risks, I had my Procedure. Discharge Instructions, Post Sedation.  Post Procedure Instructions:  The drugs you were given will stay in your system until tomorrow, so for the next 24 hours you should not drive, make any legal decisions or drink any alcoholic beverages.  You may eat anything you prefer, but it is better to start with liquids then soups and crackers, and gradually work up to solid foods.  Please notify your doctor immediately if you have any unusual bleeding, trouble breathing or pain that is not related to your normal pain.  Depending on the type of procedure that was done, some parts of your body may feel week and/or numb.  This usually clears up by tonight or the next day.  Walk with the use of an assistive device or accompanied by an adult for the 24 hours.  You may use ice on the affected area for the first 24 hours.  Put ice in a Ziploc bag and cover with a towel and place against area 15 minutes on 15 minutes off.  You may switch to heat after 24 hours.Facet Joint Block, Care After Refer to this sheet in the next few weeks. These instructions provide you with information on caring for yourself after your procedure. Your health care provider may also give you more specific instructions. Your treatment has been planned according to current medical practices, but problems sometimes occur. Call your health care provider if you have any problems or  questions after your procedure. HOME CARE INSTRUCTIONS   Keep track of the amount of pain relief you feel and how long it lasts.  Limit pain medicine within the first 4-6 hours after the procedure as directed by your health care provider.  Resume taking dietary supplements and medicines as directed by your health care provider.  You may resume your regular diet.  Do not apply heat near or over the injection site(s) for 24 hours.   Do not take a bath or soak in water (such as a pool or lake) for 24 hours.  Do not drive for 24 hours unless approved by your health care provider.  Avoid strenuous activity for 24 hours.  Remove your bandages the morning after the procedure.   If the injection site is tender, applying an ice pack may relieve some tenderness. To do this:  Put ice in a bag.  Place a towel between your skin and the bag.  Leave the ice on for 15-20 minutes, 3-4 times a day.  Keep follow-up appointments as directed by your health care provider. SEEK MEDICAL CARE IF:   Your pain is not controlled by your medicines.   There is drainage from the injection site.   There is significant bleeding or swelling at the injection site.  You have diabetes and your blood sugar is above 180 mg/dL. SEEK IMMEDIATE MEDICAL CARE IF:   You develop a fever of 101F (38.3C) or greater.   You have worsening pain or swelling around   the injection site.   You have red streaking around the injection site.   You develop severe pain that is not controlled by your medicines.   You develop a headache, stiff neck, nausea, or vomiting.   Your eyes become very sensitive to light.   You have weakness, paralysis, or tingling in your arms or legs that was not present before the procedure.   You develop difficulty urinating or breathing.    This information is not intended to replace advice given to you by your health care provider. Make sure you discuss any questions you have  with your health care provider.   Document Released: 11/17/2012 Document Revised: 12/22/2014 Document Reviewed: 11/17/2012 Elsevier Interactive Patient Education 2016 [employer] Facet Joint Block The facet joints connect the bones of the spine (vertebrae). They make it possible for you to bend, twist, and make other movements with your spine. They also prevent you from overbending, overtwisting, and making other excessive movements.  A facet joint block is a procedure where a numbing medicine (anesthetic) is injected into a facet joint. Often, a type of anti-inflammatory medicine called a steroid is also injected. A facet joint block may be done for two reasons:   Diagnosis. A facet joint block may be done as a test to see whether neck or back pain is caused by a worn-down or infected facet joint. If the pain gets better after a facet joint block, it means the pain is probably coming from the facet joint. If the pain does not get better, it means the pain is probably not coming from the facet joint.   Therapy. A facet joint block may be done to relieve neck or back pain caused by a facet joint. A facet joint block is only done as a therapy if the pain does not improve with medicine, exercise programs, physical therapy, and other forms of pain management. LET YOUR HEALTH CARE PROVIDER KNOW ABOUT:   Any allergies you have.   All medicines you are taking, including vitamins, herbs, eyedrops, and over-the-counter medicines and creams.   Previous problems you or members of your family have had with the use of anesthetics.   Any blood disorders you have had.   Other health problems you have. RISKS AND COMPLICATIONS Generally, having a facet joint block is safe. However, as with any procedure, complications can occur. Possible complications associated with having a facet joint block include:   Bleeding.   Injury to a nerve near the injection site.   Pain at the injection site.    Weakness or numbness in areas controlled by nerves near the injection site.   Infection.   Temporary fluid retention.   Allergic reaction to anesthetics or medicines used during the procedure. BEFORE THE PROCEDURE   Follow your health care provider's instructions if you are taking dietary supplements or medicines. You may need to stop taking them or reduce your dosage.   Do not take any new dietary supplements or medicines without asking your health care provider first.   Follow your health care provider's instructions about eating and drinking before the procedure. You may need to stop eating and drinking several hours before the procedure.   Arrange to have an adult drive you home after the procedure. PROCEDURE  You may need to remove your clothing and dress in an open-back gown so that your health care provider can access your spine.   The procedure will be done while you are lying on an X-ray table. Most   of the time you will be asked to lie on your stomach, but you may be asked to lie in a different position if an injection will be made in your neck.   Special machines will be used to monitor your oxygen levels, heart rate, and blood pressure.   If an injection will be made in your neck, an intravenous (IV) tube will be inserted into one of your veins. Fluids and medicine will flow directly into your body through the IV tube.   The area over the facet joint where the injection will be made will be cleaned with an antiseptic soap. The surrounding skin will be covered with sterile drapes.   An anesthetic will be applied to your skin to make the injection area numb. You may feel a temporary stinging or burning sensation.   A video X-ray machine will be used to locate the joint. A contrast dye may be injected into the facet joint area to help with locating the joint.   When the joint is located, an anesthetic medicine will be injected into the joint through the  needle.   Your health care provider will ask you whether you feel pain relief. If you do feel relief, a steroid may be injected to provide pain relief for a longer period of time. If you do not feel relief or feel only partial relief, additional injections of an anesthetic may be made in other facet joints.   The needle will be removed, the skin will be cleansed, and bandages will be applied.  AFTER THE PROCEDURE   You will be observed for 15-30 minutes before being allowed to go home. Do not drive. Have an adult drive you or take a taxi or public transportation instead.   If you feel pain relief, the pain will return in several hours or days when the anesthetic wears off.   You may feel pain relief 2-14 days after the procedure. The amount of time this relief lasts varies from person to person.   It is normal to feel some tenderness over the injected area(s) for 2 days following the procedure.   If you have diabetes, you may have a temporary increase in blood sugar.   This information is not intended to replace advice given to you by your health care provider. Make sure you discuss any questions you have with your health care provider.   Document Released: 04/22/2007 Document Revised: 12/22/2014 Document Reviewed: 09/20/2012 Elsevier Interactive Patient Education 2016 [employer] GENERAL RISKS AND COMPLICATIONS  What are the risk, side effects and possible complications? Generally speaking, most procedures are safe.  However, with any procedure there are risks, side effects, and the possibility of complications.  The risks and complications are dependent upon the sites that are lesioned, or the type of nerve block to be performed.  The closer the procedure is to the spine, the more serious the risks are.  Great care is taken when placing the radio frequency needles, block needles or lesioning probes, but sometimes complications can occur.  Infection: Any time there is an  injection through the skin, there is a risk of infection.  This is why sterile conditions are used for these blocks.  There are four possible types of infection.  Localized skin infection.  Central Nervous System Infection-This can be in the form of Meningitis, which can be deadly.  Epidural Infections-This can be in the form of an epidural abscess, which can cause pressure inside of the spine, causing compression of   the spinal cord with subsequent paralysis. This would require an emergency surgery to decompress, and there are no guarantees that the patient would recover from the paralysis.  Discitis-This is an infection of the intervertebral discs.  It occurs in about 1% of discography procedures.  It is difficult to treat and it may lead to surgery.        2. Pain: the needles have to go through skin and soft tissues, will cause soreness.       3. Damage to internal structures:  The nerves to be lesioned may be near blood vessels or    other nerves which can be potentially damaged.       4. Bleeding: Bleeding is more common if the patient is taking blood thinners such as  aspirin, Coumadin, Ticiid, Plavix, etc., or if he/she have some genetic predisposition  such as hemophilia. Bleeding into the spinal canal can cause compression of the spinal  cord with subsequent paralysis.  This would require an emergency surgery to  decompress and there are no guarantees that the patient would recover from the  paralysis.       5. Pneumothorax:  Puncturing of a lung is a possibility, every time a needle is introduced in  the area of the chest or upper back.  Pneumothorax refers to free air around the  collapsed lung(s), inside of the thoracic cavity (chest cavity).  Another two possible  complications related to a similar event would include: Hemothorax and Chylothorax.   These are variations of the Pneumothorax, where instead of air around the collapsed  lung(s), you may have blood or chyle, respectively.        6. Spinal headaches: They may occur with any procedures in the area of the spine.       7. Persistent CSF (Cerebro-Spinal Fluid) leakage: This is a rare problem, but may occur  with prolonged intrathecal or epidural catheters either due to the formation of a fistulous  track or a dural tear.       8. Nerve damage: By working so close to the spinal cord, there is always a possibility of  nerve damage, which could be as serious as a permanent spinal cord injury with  paralysis.       9. Death:  Although rare, severe deadly allergic reactions known as "Anaphylactic  reaction" can occur to any of the medications used.      10. Worsening of the symptoms:  We can always make thing worse.  What are the chances of something like this happening? Chances of any of this occuring are extremely low.  By statistics, you have more of a chance of getting killed in a motor vehicle accident: while driving to the hospital than any of the above occurring .  Nevertheless, you should be aware that they are possibilities.  In general, it is similar to taking a shower.  Everybody knows that you can slip, hit your head and get killed.  Does that mean that you should not shower again?  Nevertheless always keep in mind that statistics do not mean anything if you happen to be on the wrong side of them.  Even if a procedure has a 1 (one) in a 1,000,000 (million) chance of going wrong, it you happen to be that one..Also, keep in mind that by statistics, you have more of a chance of having something go wrong when taking medications.  Who should not have this procedure? If you are on a blood thinning   medication (e.g. Coumadin, Plavix, see list of "Blood Thinners"), or if you have an active infection going on, you should not have the procedure.  If you are taking any blood thinners, please inform your physician.  How should I prepare for this procedure?  Do not eat or drink anything at least six hours prior to the procedure.  Bring  a driver with you .  It cannot be a taxi.  Come accompanied by an adult that can drive you back, and that is strong enough to help you if your legs get weak or numb from the local anesthetic.  Take all of your medicines the morning of the procedure with just enough water to swallow them.  If you have diabetes, make sure that you are scheduled to have your procedure done first thing in the morning, whenever possible.  If you have diabetes, take only half of your insulin dose and notify our nurse that you have done so as soon as you arrive at the clinic.  If you are diabetic, but only take blood sugar pills (oral hypoglycemic), then do not take them on the morning of your procedure.  You may take them after you have had the procedure.  Do not take aspirin or any aspirin-containing medications, at least eleven (11) days prior to the procedure.  They may prolong bleeding.  Wear loose fitting clothing that may be easy to take off and that you would not mind if it got stained with Betadine or blood.  Do not wear any jewelry or perfume  Remove any nail coloring.  It will interfere with some of our monitoring equipment.  NOTE: Remember that this is not meant to be interpreted as a complete list of all possible complications.  Unforeseen problems may occur.  BLOOD THINNERS The following drugs contain aspirin or other products, which can cause increased bleeding during surgery and should not be taken for 2 weeks prior to and 1 week after surgery.  If you should need take something for relief of minor pain, you may take acetaminophen which is found in Tylenol,m Datril, Anacin-3 and Panadol. It is not blood thinner. The products listed below are.  Do not take any of the products listed below in addition to any listed on your instruction sheet.  A.P.C or A.P.C with Codeine Codeine Phosphate Capsules #3 Ibuprofen Ridaura  ABC compound Congesprin Imuran rimadil  Advil Cope Indocin Robaxisal   Alka-Seltzer Effervescent Pain Reliever and Antacid Coricidin or Coricidin-D  Indomethacin Rufen  Alka-Seltzer plus Cold Medicine Cosprin Ketoprofen S-A-C Tablets  Anacin Analgesic Tablets or Capsules Coumadin Korlgesic Salflex  Anacin Extra Strength Analgesic tablets or capsules CP-2 Tablets Lanoril Salicylate  Anaprox Cuprimine Capsules Levenox Salocol  Anexsia-D Dalteparin Magan Salsalate  Anodynos Darvon compound Magnesium Salicylate Sine-off  Ansaid Dasin Capsules Magsal Sodium Salicylate  Anturane Depen Capsules Marnal Soma  APF Arthritis pain formula Dewitt's Pills Measurin Stanback  Argesic Dia-Gesic Meclofenamic Sulfinpyrazone  Arthritis Bayer Timed Release Aspirin Diclofenac Meclomen Sulindac  Arthritis pain formula Anacin Dicumarol Medipren Supac  Analgesic (Safety coated) Arthralgen Diffunasal Mefanamic Suprofen  Arthritis Strength Bufferin Dihydrocodeine Mepro Compound Suprol  Arthropan liquid Dopirydamole Methcarbomol with Aspirin Synalgos  ASA tablets/Enseals Disalcid Micrainin Tagament  Ascriptin Doan's Midol Talwin  Ascriptin A/D Dolene Mobidin Tanderil  Ascriptin Extra Strength Dolobid Moblgesic Ticlid  Ascriptin with Codeine Doloprin or Doloprin with Codeine Momentum Tolectin  Asperbuf Duoprin Mono-gesic Trendar  Aspergum Duradyne Motrin or Motrin IB Triminicin  Aspirin plain, buffered or enteric coated Durasal   Myochrisine Trigesic  Aspirin Suppositories Easprin Nalfon Trillsate  Aspirin with Codeine Ecotrin Regular or Extra Strength Naprosyn Uracel  Atromid-S Efficin Naproxen Ursinus  Auranofin Capsules Elmiron Neocylate Vanquish  Axotal Emagrin Norgesic Verin  Azathioprine Empirin or Empirin with Codeine Normiflo Vitamin E  Azolid Emprazil Nuprin Voltaren  Bayer Aspirin plain, buffered or children's or timed BC Tablets or powders Encaprin Orgaran Warfarin Sodium  Buff-a-Comp Enoxaparin Orudis Zorpin  Buff-a-Comp with Codeine Equegesic Os-Cal-Gesic   Buffaprin  Excedrin plain, buffered or Extra Strength Oxalid   Bufferin Arthritis Strength Feldene Oxphenbutazone   Bufferin plain or Extra Strength Feldene Capsules Oxycodone with Aspirin   Bufferin with Codeine Fenoprofen Fenoprofen Pabalate or Pabalate-SF   Buffets II Flogesic Panagesic   Buffinol plain or Extra Strength Florinal or Florinal with Codeine Panwarfarin   Buf-Tabs Flurbiprofen Penicillamine   Butalbital Compound Four-way cold tablets Penicillin   Butazolidin Fragmin Pepto-Bismol   Carbenicillin Geminisyn Percodan   Carna Arthritis Reliever Geopen Persantine   Carprofen Gold's salt Persistin   Chloramphenicol Goody's Phenylbutazone   Chloromycetin Haltrain Piroxlcam   Clmetidine heparin Plaquenil   Cllnoril Hyco-pap Ponstel   Clofibrate Hydroxy chloroquine Propoxyphen         Before stopping any of these medications, be sure to consult the physician who ordered them.  Some, such as Coumadin (Warfarin) are ordered to prevent or treat serious conditions such as "deep thrombosis", "pumonary embolisms", and other heart problems.  The amount of time that you may need off of the medication may also vary with the medication and the reason for which you were taking it.  If you are taking any of these medications, please make sure you notify your pain physician before you undergo any procedures.          

## 2016-03-07 LAB — TOXASSURE SELECT 13 (MW), URINE: PDF: 0

## 2016-04-01 ENCOUNTER — Encounter: Payer: Self-pay | Admitting: Pain Medicine

## 2016-04-01 ENCOUNTER — Ambulatory Visit: Payer: PPO | Attending: Pain Medicine | Admitting: Pain Medicine

## 2016-04-01 VITALS — BP 137/76 | HR 98 | Temp 96.5°F | Resp 16 | Ht 68.0 in | Wt 246.0 lb

## 2016-04-01 DIAGNOSIS — E785 Hyperlipidemia, unspecified: Secondary | ICD-10-CM | POA: Diagnosis not present

## 2016-04-01 DIAGNOSIS — M545 Low back pain, unspecified: Secondary | ICD-10-CM

## 2016-04-01 DIAGNOSIS — K219 Gastro-esophageal reflux disease without esophagitis: Secondary | ICD-10-CM | POA: Diagnosis not present

## 2016-04-01 DIAGNOSIS — M533 Sacrococcygeal disorders, not elsewhere classified: Secondary | ICD-10-CM | POA: Diagnosis not present

## 2016-04-01 DIAGNOSIS — G8929 Other chronic pain: Secondary | ICD-10-CM | POA: Insufficient documentation

## 2016-04-01 DIAGNOSIS — F39 Unspecified mood [affective] disorder: Secondary | ICD-10-CM | POA: Diagnosis not present

## 2016-04-01 DIAGNOSIS — I872 Venous insufficiency (chronic) (peripheral): Secondary | ICD-10-CM | POA: Diagnosis not present

## 2016-04-01 DIAGNOSIS — Z79891 Long term (current) use of opiate analgesic: Secondary | ICD-10-CM | POA: Insufficient documentation

## 2016-04-01 DIAGNOSIS — J309 Allergic rhinitis, unspecified: Secondary | ICD-10-CM | POA: Diagnosis not present

## 2016-04-01 DIAGNOSIS — R0789 Other chest pain: Secondary | ICD-10-CM | POA: Diagnosis not present

## 2016-04-01 DIAGNOSIS — H8110 Benign paroxysmal vertigo, unspecified ear: Secondary | ICD-10-CM | POA: Diagnosis not present

## 2016-04-01 DIAGNOSIS — M47816 Spondylosis without myelopathy or radiculopathy, lumbar region: Secondary | ICD-10-CM

## 2016-04-01 DIAGNOSIS — M549 Dorsalgia, unspecified: Secondary | ICD-10-CM | POA: Diagnosis not present

## 2016-04-01 MED ORDER — FENTANYL CITRATE (PF) 100 MCG/2ML IJ SOLN
INTRAMUSCULAR | Status: AC
  Administered 2016-04-01: 50 ug via INTRAVENOUS
  Filled 2016-04-01: qty 2

## 2016-04-01 MED ORDER — MIDAZOLAM HCL 5 MG/5ML IJ SOLN: 5.0000 mg | INTRAMUSCULAR | Status: DC

## 2016-04-01 MED ORDER — TRIAMCINOLONE ACETONIDE 40 MG/ML IJ SUSP: 40.0000 mg | Freq: Once | INTRAMUSCULAR | Status: DC

## 2016-04-01 MED ORDER — ROPIVACAINE HCL 2 MG/ML IJ SOLN: 9.0000 mL | Freq: Once | INTRAMUSCULAR | Status: DC

## 2016-04-01 MED ORDER — LACTATED RINGERS IV SOLN: 1000.0000 mL | INTRAVENOUS | Status: AC

## 2016-04-01 MED ORDER — ROPIVACAINE HCL 2 MG/ML IJ SOLN
INTRAMUSCULAR | Status: AC
  Administered 2016-04-01: 10:00:00
  Filled 2016-04-01: qty 20

## 2016-04-01 MED ORDER — FENTANYL CITRATE (PF) 100 MCG/2ML IJ SOLN: 100.0000 ug | INTRAMUSCULAR | Status: DC

## 2016-04-01 MED ORDER — MIDAZOLAM HCL 5 MG/5ML IJ SOLN
INTRAMUSCULAR | Status: AC
  Administered 2016-04-01: 2 mg via INTRAVENOUS
  Filled 2016-04-01: qty 5

## 2016-04-01 MED ORDER — TRIAMCINOLONE ACETONIDE 40 MG/ML IJ SUSP
INTRAMUSCULAR | Status: AC
  Administered 2016-04-01: 10:00:00
  Filled 2016-04-01: qty 2

## 2016-04-01 MED ORDER — LIDOCAINE HCL (PF) 1 % IJ SOLN: 10.0000 mL | Freq: Once | INTRAMUSCULAR | Status: DC

## 2016-04-01 NOTE — Progress Notes (Signed)
Safety precautions to be maintained throughout the outpatient stay will include: orient to surroundings, keep bed in low position, maintain call bell within reach at all times, provide assistance with transfer out of bed and ambulation.  

## 2016-04-01 NOTE — Patient Instructions (Addendum)
Facet Joint Block, Care After Refer to this sheet in the next few weeks. These instructions provide you with information on caring for yourself after your procedure. Your health care provider may also give you more specific instructions. Your treatment has been planned according to current medical practices, but problems sometimes occur. Call your health care provider if you have any problems or questions after your procedure. HOME CARE INSTRUCTIONS  1. Keep track of the amount of pain relief you feel and how long it lasts. 2. Limit pain medicine within the first 4-6 hours after the procedure as directed by your health care provider. 3. Resume taking dietary supplements and medicines as directed by your health care provider. 4. You may resume your regular diet. 5. Do not apply heat near or over the injection site(s) for 24 hours.  6. Do not take a bath or soak in water (such as a pool or lake) for 24 hours. 7. Do not drive for 24 hours unless approved by your health care provider. 8. Avoid strenuous activity for 24 hours. 9. Remove your bandages the morning after the procedure.  10. If the injection site is tender, applying an ice pack may relieve some tenderness. To do this: 1. Put ice in a bag. 2. Place a towel between your skin and the bag. 3. Leave the ice on for 15-20 minutes, 3-4 times a day. 11. Keep follow-up appointments as directed by your health care provider. SEEK MEDICAL CARE IF:   Your pain is not controlled by your medicines.   There is drainage from the injection site.   There is significant bleeding or swelling at the injection site.  You have diabetes and your blood sugar is above 180 mg/dL. SEEK IMMEDIATE MEDICAL CARE IF:   You develop a fever of 101F (38.3C) or greater.   You have worsening pain or swelling around the injection site.   You have red streaking around the injection site.   You develop severe pain that is not controlled by your medicines.    You develop a headache, stiff neck, nausea, or vomiting.   Your eyes become very sensitive to light.   You have weakness, paralysis, or tingling in your arms or legs that was not present before the procedure.   You develop difficulty urinating or breathing.    This information is not intended to replace advice given to you by your health care provider. Make sure you discuss any questions you have with your health care provider.   Document Released: 11/17/2012 Document Revised: 12/22/2014 Document Reviewed: 11/17/2012 Elsevier Interactive Patient Education 2016 [employer] Facet Joint Block The facet joints connect the bones of the spine (vertebrae). They make it possible for you to bend, twist, and make other movements with your spine. They also prevent you from overbending, overtwisting, and making other excessive movements.  A facet joint block is a procedure where a numbing medicine (anesthetic) is injected into a facet joint. Often, a type of anti-inflammatory medicine called a steroid is also injected. A facet joint block may be done for two reasons:  12. Diagnosis. A facet joint block may be done as a test to see whether neck or back pain is caused by a worn-down or infected facet joint. If the pain gets better after a facet joint block, it means the pain is probably coming from the facet joint. If the pain does not get better, it means the pain is probably not coming from the facet joint.  13. Therapy.  A facet joint block may be done to relieve neck or back pain caused by a facet joint. A facet joint block is only done as a therapy if the pain does not improve with medicine, exercise programs, physical therapy, and other forms of pain management. LET [facility] CARE PROVIDER KNOW ABOUT:   Any allergies you have.   All medicines you are taking, including vitamins, herbs, eyedrops, and over-the-counter medicines and creams.   Previous problems you or members of your family  have had with the use of anesthetics.   Any blood disorders you have had.   Other health problems you have. RISKS AND COMPLICATIONS Generally, having a facet joint block is safe. However, as with any procedure, complications can occur. Possible complications associated with having a facet joint block include:   Bleeding.   Injury to a nerve near the injection site.   Pain at the injection site.   Weakness or numbness in areas controlled by nerves near the injection site.   Infection.   Temporary fluid retention.   Allergic reaction to anesthetics or medicines used during the procedure. BEFORE THE PROCEDURE   Follow your health care provider's instructions if you are taking dietary supplements or medicines. You may need to stop taking them or reduce your dosage.   Do not take any new dietary supplements or medicines without asking your health care provider first.   Follow your health care provider's instructions about eating and drinking before the procedure. You may need to stop eating and drinking several hours before the procedure.   Arrange to have an adult drive you home after the procedure. PROCEDURE 1. You may need to remove your clothing and dress in an open-back gown so that your health care provider can access your spine.  2. The procedure will be done while you are lying on an X-ray table. Most of the time you will be asked to lie on your stomach, but you may be asked to lie in a different position if an injection will be made in your neck.  3. Special machines will be used to monitor your oxygen levels, heart rate, and blood pressure.  4. If an injection will be made in your neck, an intravenous (IV) tube will be inserted into one of your veins. Fluids and medicine will flow directly into your body through the IV tube.  5. The area over the facet joint where the injection will be made will be cleaned with an antiseptic soap. The surrounding skin will be  covered with sterile drapes.  6. An anesthetic will be applied to your skin to make the injection area numb. You may feel a temporary stinging or burning sensation.  7. A video X-ray machine will be used to locate the joint. A contrast dye may be injected into the facet joint area to help with locating the joint.  8. When the joint is located, an anesthetic medicine will be injected into the joint through the needle.  9. Your health care provider will ask you whether you feel pain relief. If you do feel relief, a steroid may be injected to provide pain relief for a longer period of time. If you do not feel relief or feel only partial relief, additional injections of an anesthetic may be made in other facet joints.  10. The needle will be removed, the skin will be cleansed, and bandages will be applied.  AFTER THE PROCEDURE   You will be observed for 15-30 minutes before being  allowed to go home. Do not drive. Have an adult drive you or take a taxi or public transportation instead.   If you feel pain relief, the pain will return in several hours or days when the anesthetic wears off.   You may feel pain relief 2-14 days after the procedure. The amount of time this relief lasts varies from person to person.   It is normal to feel some tenderness over the injected area(s) for 2 days following the procedure.   If you have diabetes, you may have a temporary increase in blood sugar.   This information is not intended to replace advice given to you by your health care provider. Make sure you discuss any questions you have with your health care provider.   Document Released: 04/22/2007 Document Revised: 12/22/2014 Document Reviewed: 09/20/2012 Elsevier Interactive Patient Education 2016 [employer] Pain Management Discharge Instructions  General Discharge Instructions :  If you need to reach your doctor call: Monday-Friday 8:00 am - 4:00 pm at [phone] or toll free [phone].   After clinic hours [phone] to have operator reach doctor.  Bring all of your medication bottles to all your appointments in the pain clinic.  To cancel or reschedule your appointment with Pain Management please remember to call 24 hours in advance to avoid a fee.  Refer to the educational materials which you have been given on: General Risks, I had my Procedure. Discharge Instructions, Post Sedation.  Post Procedure Instructions:  The drugs you were given will stay in your system until tomorrow, so for the next 24 hours you should not drive, make any legal decisions or drink any alcoholic beverages.  You may eat anything you prefer, but it is better to start with liquids then soups and crackers, and gradually work up to solid foods.  Please notify your doctor immediately if you have any unusual bleeding, trouble breathing or pain that is not related to your normal pain.  Depending on the type of procedure that was done, some parts of your body may feel week and/or numb.  This usually clears up by tonight or the next day.  Walk with the use of an assistive device or accompanied by an adult for the 24 hours.  You may use ice on the affected area for the first 24 hours.  Put ice in a Ziploc bag and cover with a towel and place against area 15 minutes on 15 minutes off.  You may switch to heat after 24 hours.GENERAL RISKS AND COMPLICATIONS  What are the risk, side effects and possible complications? Generally speaking, most procedures are safe.  However, with any procedure there are risks, side effects, and the possibility of complications.  The risks and complications are dependent upon the sites that are lesioned, or the type of nerve block to be performed.  The closer the procedure is to the spine, the more serious the risks are.  Great care is taken when placing the radio frequency needles, block needles or lesioning probes, but sometimes complications can occur. 1. Infection: Any time  there is an injection through the skin, there is a risk of infection.  This is why sterile conditions are used for these blocks.  There are four possible types of infection. 1. Localized skin infection. 2. Central Nervous System Infection-This can be in the form of Meningitis, which can be deadly. 3. Epidural Infections-This can be in the form of an epidural abscess, which can cause pressure inside of the spine, causing compression of  the spinal cord with subsequent paralysis. This would require an emergency surgery to decompress, and there are no guarantees that the patient would recover from the paralysis. 4. Discitis-This is an infection of the intervertebral discs.  It occurs in about 1% of discography procedures.  It is difficult to treat and it may lead to surgery.        2. Pain: the needles have to go through skin and soft tissues, will cause soreness.       3. Damage to internal structures:  The nerves to be lesioned may be near blood vessels or    other nerves which can be potentially damaged.       4. Bleeding: Bleeding is more common if the patient is taking blood thinners such as  aspirin, Coumadin, Ticiid, Plavix, etc., or if he/she have some genetic predisposition  such as hemophilia. Bleeding into the spinal canal can cause compression of the spinal  cord with subsequent paralysis.  This would require an emergency surgery to  decompress and there are no guarantees that the patient would recover from the  paralysis.       5. Pneumothorax:  Puncturing of a lung is a possibility, every time a needle is introduced in  the area of the chest or upper back.  Pneumothorax refers to free air around the  collapsed lung(s), inside of the thoracic cavity (chest cavity).  Another two possible  complications related to a similar event would include: Hemothorax and Chylothorax.   These are variations of the Pneumothorax, where instead of air around the collapsed  lung(s), you may have blood or chyle,  respectively.       6. Spinal headaches: They may occur with any procedures in the area of the spine.       7. Persistent CSF (Cerebro-Spinal Fluid) leakage: This is a rare problem, but may occur  with prolonged intrathecal or epidural catheters either due to the formation of a fistulous  track or a dural tear.       8. Nerve damage: By working so close to the spinal cord, there is always a possibility of  nerve damage, which could be as serious as a permanent spinal cord injury with  paralysis.       9. Death:  Although rare, severe deadly allergic reactions known as "Anaphylactic  reaction" can occur to any of the medications used.      10. Worsening of the symptoms:  We can always make thing worse.  What are the chances of something like this happening? Chances of any of this occuring are extremely low.  By statistics, you have more of a chance of getting killed in a motor vehicle accident: while driving to the hospital than any of the above occurring .  Nevertheless, you should be aware that they are possibilities.  In general, it is similar to taking a shower.  Everybody knows that you can slip, hit your head and get killed.  Does that mean that you should not shower again?  Nevertheless always keep in mind that statistics do not mean anything if you happen to be on the wrong side of them.  Even if a procedure has a 1 (one) in a 1,000,000 (million) chance of going wrong, it you happen to be that one..Also, keep in mind that by statistics, you have more of a chance of having something go wrong when taking medications.  Who should not have this procedure? If you are on a blood thinning  medication (e.g. Coumadin, Plavix, see list of "Blood Thinners"), or if you have an active infection going on, you should not have the procedure.  If you are taking any blood thinners, please inform your physician.  How should I prepare for this procedure?  Do not eat or drink anything at least six hours prior to the  procedure.  Bring a driver with you .  It cannot be a taxi.  Come accompanied by an adult that can drive you back, and that is strong enough to help you if your legs get weak or numb from the local anesthetic.  Take all of your medicines the morning of the procedure with just enough water to swallow them.  If you have diabetes, make sure that you are scheduled to have your procedure done first thing in the morning, whenever possible.  If you have diabetes, take only half of your insulin dose and notify our nurse that you have done so as soon as you arrive at the clinic.  If you are diabetic, but only take blood sugar pills (oral hypoglycemic), then do not take them on the morning of your procedure.  You may take them after you have had the procedure.  Do not take aspirin or any aspirin-containing medications, at least eleven (11) days prior to the procedure.  They may prolong bleeding.  Wear loose fitting clothing that may be easy to take off and that you would not mind if it got stained with Betadine or blood.  Do not wear any jewelry or perfume  Remove any nail coloring.  It will interfere with some of our monitoring equipment.  NOTE: Remember that this is not meant to be interpreted as a complete list of all possible complications.  Unforeseen problems may occur.  BLOOD THINNERS The following drugs contain aspirin or other products, which can cause increased bleeding during surgery and should not be taken for 2 weeks prior to and 1 week after surgery.  If you should need take something for relief of minor pain, you may take acetaminophen which is found in Tylenol,m Datril, Anacin-3 and Panadol. It is not blood thinner. The products listed below are.  Do not take any of the products listed below in addition to any listed on your instruction sheet.  A.P.C or A.P.C with Codeine Codeine Phosphate Capsules #3 Ibuprofen Ridaura  ABC compound Congesprin Imuran rimadil  Advil Cope Indocin  Robaxisal  Alka-Seltzer Effervescent Pain Reliever and Antacid Coricidin or Coricidin-D  Indomethacin Rufen  Alka-Seltzer plus Cold Medicine Cosprin Ketoprofen S-A-C Tablets  Anacin Analgesic Tablets or Capsules Coumadin Korlgesic Salflex  Anacin Extra Strength Analgesic tablets or capsules CP-2 Tablets Lanoril Salicylate  Anaprox Cuprimine Capsules Levenox Salocol  Anexsia-D Dalteparin Magan Salsalate  Anodynos Darvon compound Magnesium Salicylate Sine-off  Ansaid Dasin Capsules Magsal Sodium Salicylate  Anturane Depen Capsules Marnal Soma  APF Arthritis pain formula Dewitt's Pills Measurin Stanback  Argesic Dia-Gesic Meclofenamic Sulfinpyrazone  Arthritis Bayer Timed Release Aspirin Diclofenac Meclomen Sulindac  Arthritis pain formula Anacin Dicumarol Medipren Supac  Analgesic (Safety coated) Arthralgen Diffunasal Mefanamic Suprofen  Arthritis Strength Bufferin Dihydrocodeine Mepro Compound Suprol  Arthropan liquid Dopirydamole Methcarbomol with Aspirin Synalgos  ASA tablets/Enseals Disalcid Micrainin Tagament  Ascriptin Doan's Midol Talwin  Ascriptin A/D Dolene Mobidin Tanderil  Ascriptin Extra Strength Dolobid Moblgesic Ticlid  Ascriptin with Codeine Doloprin or Doloprin with Codeine Momentum Tolectin  Asperbuf Duoprin Mono-gesic Trendar  Aspergum Duradyne Motrin or Motrin IB Triminicin  Aspirin plain, buffered or enteric coated Durasal   Myochrisine Trigesic  Aspirin Suppositories Easprin Nalfon Trillsate  Aspirin with Codeine Ecotrin Regular or Extra Strength Naprosyn Uracel  Atromid-S Efficin Naproxen Ursinus  Auranofin Capsules Elmiron Neocylate Vanquish  Axotal Emagrin Norgesic Verin  Azathioprine Empirin or Empirin with Codeine Normiflo Vitamin E  Azolid Emprazil Nuprin Voltaren  Bayer Aspirin plain, buffered or children's or timed BC Tablets or powders Encaprin Orgaran Warfarin Sodium  Buff-a-Comp Enoxaparin Orudis Zorpin  Buff-a-Comp with Codeine Equegesic Os-Cal-Gesic    Buffaprin Excedrin plain, buffered or Extra Strength Oxalid   Bufferin Arthritis Strength Feldene Oxphenbutazone   Bufferin plain or Extra Strength Feldene Capsules Oxycodone with Aspirin   Bufferin with Codeine Fenoprofen Fenoprofen Pabalate or Pabalate-SF   Buffets II Flogesic Panagesic   Buffinol plain or Extra Strength Florinal or Florinal with Codeine Panwarfarin   Buf-Tabs Flurbiprofen Penicillamine   Butalbital Compound Four-way cold tablets Penicillin   Butazolidin Fragmin Pepto-Bismol   Carbenicillin Geminisyn Percodan   Carna Arthritis Reliever Geopen Persantine   Carprofen Gold's salt Persistin   Chloramphenicol Goody's Phenylbutazone   Chloromycetin Haltrain Piroxlcam   Clmetidine heparin Plaquenil   Cllnoril Hyco-pap Ponstel   Clofibrate Hydroxy chloroquine Propoxyphen         Before stopping any of these medications, be sure to consult the physician who ordered them.  Some, such as Coumadin (Warfarin) are ordered to prevent or treat serious conditions such as "deep thrombosis", "pumonary embolisms", and other heart problems.  The amount of time that you may need off of the medication may also vary with the medication and the reason for which you were taking it.  If you are taking any of these medications, please make sure you notify your pain physician before you undergo any procedures.          

## 2016-04-01 NOTE — Progress Notes (Signed)
Patient's Name: Alice Donaldson  Patient type: Established  MRN: [id number]  Service setting: Ambulatory outpatient  DOB: 03-27-1946  Location: [facility]  DOS: 04/01/2016  Primary Care Physician: Viviana Simpler, MD  Note by: Kathlen Brunswick. Dossie Arbour, M.D, DABA, DABAPM, DABPM, DABIPP, FIPP  Referring Physician: Milinda Pointer, MD  Specialty: Board-Certified Interventional Pain Management     Primary Reason(s) for Visit: Interventional Pain Management Treatment. CC: Back Pain and Pain  Procedure:  Anesthesia, Analgesia, Anxiolysis:  Type: Diagnostic Medial Branch Facet Block Region: Lumbar Level: L2, L3, L4, L5, & S1 Medial Branch Level(s) Laterality: Bilateral  Indications: 1. Lumbar facet syndrome   2. Chronic low back pain   3. Lumbar spondylosis, unspecified spinal osteoarthritis     Pre-procedure Pain Score: 5/10 Reported level of pain is compatible with clinical observations Post-procedure Pain Score: 0-No pain  Type: Moderate (Conscious) Sedation & Local Anesthesia Local Anesthetic: Lidocaine 1% Route: Intravenous (IV) IV Access: Secured Sedation: Meaningful verbal contact was maintained at all times during the procedure  Indication(s): Analgesia & Anxiolysis   Pre-Procedure Assessment:  Alice Donaldson is a 70 y.o. year old, female patient, seen today for interventional treatment. She has GERD (gastroesophageal reflux disease); Allergic rhinitis, cause unspecified; Chronic back pain; Hyperlipidemia; Chronic venous insufficiency; BPPV (benign paroxysmal positional vertigo); Routine general medical examination at a health care facility; Chest pressure; Episodic mood disorder ([place]); Impaired fasting glucose; Sebaceous cyst; Cough; Vascular disorder of lower extremity; Chronic low back pain; Lumbar spondylosis; Lumbar facet syndrome; Chronic sacroiliac joint pain; Chronic pain; Long term current use of opiate analgesic; Long term prescription opiate use;  Opiate use; Encounter for therapeutic drug level monitoring; Encounter for chronic pain management; and Chronic myofascial pain on her problem list.. Her primarily concern today is the Back Pain and Pain   Pain Type: Chronic pain Pain Location: Buttocks Pain Orientation: Left Pain Descriptors / Indicators: Sharp, Stabbing Pain Frequency: Intermittent  Date of Last Visit: 03/03/16 Service Provided on Last Visit: Med Refill  Verification of the correct person, correct site (including marking of site), and correct procedure were performed and confirmed by the patient.  Today's Vitals   04/01/16 1009 04/01/16 1019 04/01/16 1024 04/01/16 1029  BP: 151/66 135/66 139/71 137/76  Pulse:      Temp: 96 F (35.6 C)  96.5 F (35.8 C) 96.5 F (35.8 C)  TempSrc:      Resp: _0 Height:      Weight:      SpO2: 94% 98% 93% 99%  PainSc: 1    0-No pain  Calculated BMI: Body mass index is 37.41 kg/(m^2). Allergies: She is allergic to prevpac and latex.. Primary Diagnosis: Facet syndrome, lumbar [M54.5]  Consent: Secured. Under the influence of no sedatives a written informed consent was obtained, after having provided information on the risks and possible complications. To fulfill our ethical and legal obligations, as recommended by the American Medical Association's Code of Ethics, we have provided information to the patient about our clinical impression; the nature and purpose of the treatment or procedure; the risks, benefits, and possible complications of the intervention; alternatives; the risk(s) and benefit(s) of the alternative treatment(s) or procedure(s); and the risk(s) and benefit(s) of doing nothing. The patient was provided information about the risks and possible complications associated with the procedure. In the case of spinal procedures these may include, but are not limited to, failure to achieve desired goals, infection, bleeding, organ or nerve damage, allergic reactions,  paralysis, and death. In addition, the patient was informed that Medicine is not an exact science; therefore, there is also the possibility of unforeseen risks and possible complications that may result in a catastrophic outcome. The patient indicated having understood very clearly. We have given the patient no guarantees and we have made no promises. Enough time was given to the patient to ask questions, all of which were answered to the patient's satisfaction.  Pre-Procedure Preparation: Safety Precautions: Allergies reviewed. Appropriate site, procedure, and patient were confirmed by following the Joint Commission's Universal Protocol (UP.01.01.01), in the form of a "Time Out". The patient was asked to confirm marked site and procedure, before commencing. The patient was asked about blood thinners, or active infections, both of which were denied. Patient was assessed for positional comfort and all pressure points were checked before starting procedure. Monitoring:  As per clinic protocol. Infection Control Precautions: Sterile technique used. Standard Universal Precautions were taken as recommended by the Department of [facility] for Disease Control and Prevention (CDC). Standard pre-surgical skin prep was conducted. Respiratory hygiene and cough etiquette was practiced. Hand hygiene observed. Safe injection practices and needle disposal techniques followed. SDV (single dose vial) medications used. Medications properly checked for expiration dates and contaminants. Personal protective equipment (PPE) used: Sterile double glove technique. Radiation resistant gloves. Sterile surgical gloves.  Description of Procedure Process:   Time-out: "Time-out" completed before starting procedure, as per protocol. Position: Prone Target Area: For Lumbar Facet blocks, the target is the groove formed by the junction of the transverse process and superior articular process. For the L5 dorsal ramus, the target  is the notch between superior articular process and sacral ala. For the S1 dorsal ramus, the target is the superior and lateral edge of the posterior S1 Sacral foramen. Approach: Paramedial approach. Area Prepped: Entire Posterior Lumbosacral Region Prepping solution: ChloraPrep (2% chlorhexidine gluconate and 70% isopropyl alcohol) Safety Precautions: Aspiration looking for blood return was conducted prior to all injections. At no point did we inject any substances, as a needle was being advanced. No attempts were made at seeking any paresthesias. Safe injection practices and needle disposal techniques used. Medications properly checked for expiration dates. SDV (single dose vial) medications used.   Description of the Procedure: Protocol guidelines were followed. The patient was placed in position over the fluoroscopy table. The target area was identified and the area prepped in the usual manner. Skin desensitized using vapocoolant spray. Skin & deeper tissues infiltrated with local anesthetic. Appropriate amount of time allowed to pass for local anesthetics to take effect. The procedure needle was introduced through the skin, ipsilateral to the reported pain, and advanced to the target area. Employing the "Medial Branch Technique", the needles were advanced to the angle made by the superior and medial portion of the transverse process, and the lateral and inferior portion of the superior articulating process of the targeted vertebral bodies. This area is known as "Burton's Eye" or the "Eye of the [place] Dog". A procedure needle was introduced through the skin, and this time advanced to the angle made by the superior and medial border of the sacral ala, and the lateral border of the S1 vertebral body. This last needle was later repositioned at the superior and lateral border of the posterior S1 foramen. Negative aspiration confirmed. Solution injected in intermittent fashion, asking for systemic symptoms  every 0.5cc of injectate. The needles were then removed and the area cleansed, making sure to leave some of the prepping solution  back to take advantage of its long term bactericidal properties. EBL: None Materials & Medications Used:  Needle(s) Used: 22g - 3.5" Spinal Needle(s) Medications Administered today: We administered ropivacaine (PF) 2 mg/ml (0.2%), fentaNYL, triamcinolone acetonide, and midazolam.Please see chart orders for dosing details.  Imaging Guidance:   Type of Imaging Technique: Fluoroscopy Guidance (Spinal) Indication(s): Assistance in needle guidance and placement for procedures requiring needle placement in or near specific anatomical locations not easily accessible without such assistance. Exposure Time: Please see nurses notes. Contrast: None required. Fluoroscopic Guidance: I was personally present in the fluoroscopy suite, where the patient was placed in position for the procedure, over the fluoroscopy-compatible table. Fluoroscopy was manipulated, using "Tunnel Vision Technique", to obtain the best possible view of the target area, on the affected side. Parallax error was corrected before commencing the procedure. A "direction-depth-direction" technique was used to introduce the needle under continuous pulsed fluoroscopic guidance. Once the target was reached, antero-posterior, oblique, and lateral fluoroscopic projection views were taken to confirm needle placement in all planes. Permanently recorded images stored by scanning into EMR. Interpretation: Intraoperative imaging interpretation by performing Physician. Adequate needle placement confirmed. Adequate needle placement confirmed in AP, lateral, & Oblique Views. No contrast injected.  Antibiotic Prophylaxis:  Indication(s): No indications identified. Type:  Antibiotics Given (last 72 hours)    None       Post-operative Assessment:   Complications: No immediate post-treatment complications were  observed. Disposition: Return to clinic for follow-up evaluation. The patient tolerated the entire procedure well. A repeat set of vitals were taken after the procedure and the patient was kept under observation following institutional policy, for this procedure. Post-procedural neurological assessment was performed, showing return to baseline, prior to discharge. The patient was discharged home, once institutional criteria were met. The patient was provided with post-procedure discharge instructions, including a section on how to identify potential problems. Should any problems arise concerning this procedure, the patient was given instructions to immediately contact us, at any time, without hesitation. In any case, we plan to contact the patient by telephone for a follow-up status report regarding this interventional procedure. Comments:  No additional relevant information.  Medications administered during this visit: We administered ropivacaine (PF) 2 mg/ml (0.2%), fentaNYL, triamcinolone acetonide, and midazolam.  Prescriptions ordered during this visit: New Prescriptions   No medications on file    Future Appointments Date Time Provider Filer  04/23/2016 8:20 AM Milinda Pointer, MD [facility] None  05/06/2016 10:30 AM [name], MD [facility]    Primary Care Physician: Viviana Simpler, MD Location: [facility] Note by: Kathlen Brunswick. Dossie Arbour, M.D, DABA, DABAPM, DABPM, DABIPP, FIPP   Illustration of the posterior view of the lumbar spine and the posterior neural structures. Laminae of L2 through S1 are labeled. DPRL5, dorsal primary ramus of L5; DPRS1, dorsal primary ramus of S1; DPR3, dorsal primary ramus of L3; FJ, facet (zygapophyseal) joint L3-L4; I, inferior articular process of L4; LB1, lateral branch of dorsal primary ramus of L1; IAB, inferior articular branches from L3 medial branch (supplies L4-L5 facet joint); IBP,  intermediate branch plexus; MB3, medial branch of dorsal primary ramus of L3; NR3, third lumbar nerve root; S, superior articular process of L5; SAB, superior articular branches from L4 (supplies L4-5 facet joint also); TP3, transverse process of L3.  Disclaimer:  Medicine is not an Chief Strategy Officer. The only guarantee in medicine is that nothing is guaranteed. It is important to note that the decision to proceed  with this intervention was based on the information collected from the patient. The Data and conclusions were drawn from the patient's questionnaire, the interview, and the physical examination. Because the information was provided in large part by the patient, it cannot be guaranteed that it has not been purposely or unconsciously manipulated. Every effort has been made to obtain as much relevant data as possible for this evaluation. It is important to note that the conclusions that lead to this procedure are derived in large part from the available data. Always take into account that the treatment will also be dependent on availability of resources and existing treatment guidelines, considered by other Pain Management Practitioners as being common knowledge and practice, at the time of the intervention. For Medico-Legal purposes, it is also important to point out that variation in procedural techniques and pharmacological choices are the acceptable norm. The indications, contraindications, technique, and results of the above procedure should only be interpreted and judged by a Board-Certified Interventional Pain Specialist with extensive familiarity and expertise in the same exact procedure and technique. Attempts at providing opinions without similar or greater experience and expertise than that of the treating physician will be considered as inappropriate and unethical, and shall result in a formal complaint to the state medical board and applicable specialty societies.

## 2016-04-02 ENCOUNTER — Telehealth: Payer: Self-pay | Admitting: *Deleted

## 2016-04-02 NOTE — Telephone Encounter (Signed)
Spoke with patient re; procedure on yesterday.  Verbalizes no complications or concerns.

## 2016-04-23 ENCOUNTER — Ambulatory Visit: Payer: PPO | Admitting: Pain Medicine

## 2016-04-30 ENCOUNTER — Ambulatory Visit: Payer: PPO | Attending: Pain Medicine | Admitting: Pain Medicine

## 2016-04-30 ENCOUNTER — Encounter: Payer: Self-pay | Admitting: Pain Medicine

## 2016-04-30 VITALS — BP 151/86 | HR 76 | Temp 98.1°F | Resp 18 | Ht 68.0 in | Wt 246.0 lb

## 2016-04-30 DIAGNOSIS — J309 Allergic rhinitis, unspecified: Secondary | ICD-10-CM | POA: Insufficient documentation

## 2016-04-30 DIAGNOSIS — M533 Sacrococcygeal disorders, not elsewhere classified: Secondary | ICD-10-CM

## 2016-04-30 DIAGNOSIS — M549 Dorsalgia, unspecified: Secondary | ICD-10-CM | POA: Diagnosis not present

## 2016-04-30 DIAGNOSIS — K219 Gastro-esophageal reflux disease without esophagitis: Secondary | ICD-10-CM | POA: Insufficient documentation

## 2016-04-30 DIAGNOSIS — L723 Sebaceous cyst: Secondary | ICD-10-CM | POA: Insufficient documentation

## 2016-04-30 DIAGNOSIS — K449 Diaphragmatic hernia without obstruction or gangrene: Secondary | ICD-10-CM | POA: Insufficient documentation

## 2016-04-30 DIAGNOSIS — M545 Low back pain: Secondary | ICD-10-CM | POA: Diagnosis not present

## 2016-04-30 DIAGNOSIS — E785 Hyperlipidemia, unspecified: Secondary | ICD-10-CM | POA: Insufficient documentation

## 2016-04-30 DIAGNOSIS — I998 Other disorder of circulatory system: Secondary | ICD-10-CM | POA: Diagnosis not present

## 2016-04-30 DIAGNOSIS — F39 Unspecified mood [affective] disorder: Secondary | ICD-10-CM | POA: Diagnosis not present

## 2016-04-30 DIAGNOSIS — Z87891 Personal history of nicotine dependence: Secondary | ICD-10-CM | POA: Diagnosis not present

## 2016-04-30 DIAGNOSIS — M47816 Spondylosis without myelopathy or radiculopathy, lumbar region: Secondary | ICD-10-CM | POA: Diagnosis not present

## 2016-04-30 DIAGNOSIS — R05 Cough: Secondary | ICD-10-CM | POA: Insufficient documentation

## 2016-04-30 DIAGNOSIS — Z79891 Long term (current) use of opiate analgesic: Secondary | ICD-10-CM | POA: Insufficient documentation

## 2016-04-30 DIAGNOSIS — M791 Myalgia: Secondary | ICD-10-CM | POA: Diagnosis not present

## 2016-04-30 DIAGNOSIS — G8929 Other chronic pain: Secondary | ICD-10-CM

## 2016-04-30 DIAGNOSIS — M25559 Pain in unspecified hip: Secondary | ICD-10-CM | POA: Diagnosis not present

## 2016-04-30 NOTE — Patient Instructions (Signed)
Radiofrequency Lesioning Radiofrequency lesioning is a procedure that is performed to relieve pain. The procedure is often used for back, neck, or arm pain. Radiofrequency lesioning involves the use of a machine that creates radio waves to make heat. During the procedure, the heat is applied to the nerve that carries the pain signal. The heat damages the nerve and interferes with the pain signal. Pain relief usually lasts for 6 months to 1 year. LET [facility] CARE PROVIDER KNOW ABOUT:  Any allergies you have.  All medicines you are taking, including vitamins, herbs, eye drops, creams, and over-the-counter medicines.  Previous problems you or members of your family have had with the use of anesthetics.  Any blood disorders you have.  Previous surgeries you have had.  Any medical conditions you have.  Whether you are pregnant or may be pregnant. RISKS AND COMPLICATIONS Generally, this is a safe procedure. However, problems may occur, including:  Pain or soreness at the injection site.  Infection at the injection site.  Damage to nerves or blood vessels. BEFORE THE PROCEDURE  Ask your health care provider about:  Changing or stopping your regular medicines. This is especially important if you are taking diabetes medicines or blood thinners.  Taking medicines such as aspirin and ibuprofen. These medicines can thin your blood. Do not take these medicines before your procedure if your health care provider instructs you not to.  Follow instructions from your health care provider about eating or drinking restrictions.  Plan to have someone take you home after the procedure.  If you go home right after the procedure, plan to have someone with you for 24 hours. PROCEDURE  You will be given one or more of the following:  A medicine to help you relax (sedative).  A medicine to numb the area (local anesthetic).  You will be awake during the procedure. You will need to be able to  talk with the health care provider during the procedure.  With the help of a type of X-ray (fluoroscopy), the health care provider will insert a radiofrequency needle into the area to be treated.  Next, a wire that carries the radio waves (electrode) will be put through the radiofrequency needle. An electrical pulse will be sent through the electrode to verify the correct nerve. You will feel a tingling sensation, and you may have muscle twitching.  Then, the tissue that is around the needle tip will be heated by an electric current that is passed using the radiofrequency machine. This will numb the nerves.  A bandage (dressing) will be put on the insertion area after the procedure is done. The procedure may vary among health care providers and hospitals. AFTER THE PROCEDURE  Your blood pressure, heart rate, breathing rate, and blood oxygen level will be monitored often until the medicines you were given have worn off.  Return to your normal activities as directed by your health care provider.   This information is not intended to replace advice given to you by your health care provider. Make sure you discuss any questions you have with your health care provider.   Document Released: 07/30/2011 Document Revised: 08/22/2015 Document Reviewed: 01/08/2015 Elsevier Interactive Patient Education 2016 [place] Facet Blocks Patient Information  Description: The facets are joints in the spine between the vertebrae.  Like any joints in the body, facets can become irritated and painful.  Arthritis can also effect the facets.  By injecting steroids and local anesthetic in and around these joints, we can  temporarily block the nerve supply to them.  Steroids act directly on irritated nerves and tissues to reduce selling and inflammation which often leads to decreased pain.  Facet blocks may be done anywhere along the spine from the neck to the low back depending upon the location of your  pain.   After numbing the skin with local anesthetic (like Novocaine), a small needle is passed onto the facet joints under x-ray guidance.  You may experience a sensation of pressure while this is being done.  The entire block usually lasts about 15-25 minutes.   Conditions which may be treated by facet blocks:   Low back/buttock pain  Neck/shoulder pain  Certain types of headaches  Preparation for the injection:   Do not eat any solid food or dairy products within 8 hours of your appointment.  You may drink clear liquid up to 3 hours before appointment.  Clear liquids include water, black coffee, juice or soda.  No milk or cream please.  You may take your regular medication, including pain medications, with a sip of water before your appointment.  Diabetics should hold regular insulin (if taken separately) and take 1/2 normal NPH dose the morning of the procedure.  Carry some sugar containing items with you to your appointment.  A driver must accompany you and be prepared to drive you home after your procedure.  Bring all your current medications with you.  An IV may be inserted and sedation may be given at the discretion of the physician.  A blood pressure cuff, EKG and other monitors will often be applied during the procedure.  Some patients may need to have extra oxygen administered for a short period.  You will be asked to provide medical information, including your allergies and medications, prior to the procedure.  We must know immediately if you are taking blood thinners (like Coumadin/Warfarin) or if you are allergic to IV iodine contrast (dye).  We must know if you could possible be pregnant.  Possible side-effects:   Bleeding from needle site  Infection (rare, may require surgery)  Nerve injury (rare)  Numbness & tingling (temporary)  Difficulty urinating (rare, temporary)  Spinal headache (a headache worse with upright posture)  Light-headedness  (temporary)  Pain at injection site (serveral days)  Decreased blood pressure (rare, temporary)  Weakness in arm/leg (temporary)  Pressure sensation in back/neck (temporary)   Call if you experience:   Fever/chills associated with headache or increased back/neck pain  Headache worsened by an upright position  New onset, weakness or numbness of an extremity below the injection site  Hives or difficulty breathing (go to the emergency room)  Inflammation or drainage at the injection site(s)  Severe back/neck pain greater than usual  New symptoms which are concerning to you  Please note:  Although the local anesthetic injected can often make your back or neck feel good for several hours after the injection, the pain will likely return. It takes 3-7 days for steroids to work.  You may not notice any pain relief for at least one week.  If effective, we will often do a series of 2-3 injections spaced 3-6 weeks apart to maximally decrease your pain.  After the initial series, you may be a candidate for a more permanent nerve block of the facets.  If you have any questions, please call #[phone]) [facility]

## 2016-04-30 NOTE — Progress Notes (Signed)
Safety precautions to be maintained throughout the outpatient stay will include: orient to surroundings, keep bed in low position, maintain call bell within reach at all times, provide assistance with transfer out of bed and ambulation.  

## 2016-04-30 NOTE — Progress Notes (Signed)
Patient's Name: Alice Donaldson  Patient type: Established  MRN: [id number]  Service setting: Ambulatory outpatient  DOB: [birth year]  Location: [facility]  DOS: 04/30/2016  Primary Care Physician: Viviana Simpler, MD  Note by: Kathlen Brunswick. Dossie Arbour, M.D, DABA, DABAPM, DABPM, DABIPP, FIPP  Referring Physician: [name], MD  Specialty: Board-Certified Interventional Pain Management  Last Visit to Pain Management: 04/02/2016   Primary Reason(s) for Visit: Encounter for post-procedure evaluation of chronic illness with mild to moderate exacerbation CC: Back Pain and Hip Pain   HPI  [name] is a 70 y.o. year old, female patient, who returns today as an established patient. She has GERD (gastroesophageal reflux disease); Allergic rhinitis, cause unspecified; Chronic back pain; Hyperlipidemia; Chronic venous insufficiency; BPPV (benign paroxysmal positional vertigo); Routine general medical examination at a health care facility; Chest pressure; Episodic mood disorder ([place]); Impaired fasting glucose; Sebaceous cyst; Cough; Vascular disorder of lower extremity; Chronic low back pain; Lumbar spondylosis; Lumbar facet syndrome; Chronic sacroiliac joint pain; Chronic pain; Long term current use of opiate analgesic; Long term prescription opiate use; Opiate use; Encounter for therapeutic drug level monitoring; Encounter for chronic pain management; and Chronic myofascial pain on her problem list.. Her primarily concern today is the Back Pain and Hip Pain   Pain Assessment: Self-Reported Pain Score: 4  Reported level is compatible with observation Pain Type: Chronic pain Pain Location: Hip Pain Orientation: Lower, Left Pain Descriptors / Indicators: Burning Pain Frequency: Constant  The patient comes into the clinics today for post-procedure evaluation on the interventional treatment done on 04/01/2016.  Date of Last Visit: 04/01/16 Service Provided on Last Visit:  Procedure  Post-Procedure Assessment  Procedure done on last visit: Diagnostic bilateral lumbar facet block under fluoroscopic guidance and IV sedation #1. Side-effects or Adverse reactions: None reported Sedation: Sedation given  Results: The patient admits that she might have ended up with more relief than described because she did feel good for several days but for the first 2 days, she says that she was having some difficulty with memory which is likely to come from the benzodiazepines that we use for this sedation. This is why we recommend to the patient has not 2 make any important decisions for at least 48 hours after receiving sedation. Ultra-Short Term Relief (First 1 hour after procedure): 90 %  Analgesia during this period is likely to be Local Anesthetic and/or IV Sedative (Analgesic/Anxiolitic) related Short Term Relief (Initial 4-6 hrs after procedure): 90 % Complete relief confirms area to be the source of pain Long Term Relief : 0 % No benefit could suggest etiology to be non-inflammatory, possibly compressive   Current Relief (Now): 20%  Recurrance of pain could suggest persistent aggravating factors Interpretation of Results: Based on these results, it is evident that the patient has anywhere from 90-100% of her low back pain coming from the facet joints. Because of this, today I talked to talk to her about how to avoid stressing the facet joints including bringing down her BMI to less than 30. I have recommended that she contact her primary care physician to get assistance with this.  Laboratory Chemistry  Inflammation Markers Lab Results  Component Value Date   ESRSEDRATE 9 05/12/2014    Renal Function Lab Results  Component Value Date   BUN 15 04/18/2015   CREATININE 0.93 04/18/2015   GFRAA >60 05/12/2014   GFRNONAA 55* 05/12/2014    Hepatic Function Lab Results  Component Value Date  AST 19 04/18/2015   ALT 16 04/18/2015   ALBUMIN 4.0 04/18/2015     Electrolytes Lab Results  Component Value Date   NA 138 04/18/2015   K 4.5 04/18/2015   CL 107 04/18/2015   CALCIUM 9.6 04/18/2015   MG 1.9 05/12/2014    Pain Modulating Vitamins No results found for: [place], [id number], [id number], [id number], VITAMINB12  Coagulation Parameters Lab Results  Component Value Date   INR 2.4 10/20/2011    Note: I personally reviewed the above data. Results made available to patient.  Recent Diagnostic Imaging  Dg Chest 2 View  02/08/2016  CLINICAL DATA:  Cough and shortness of Breath EXAM: CHEST  2 VIEW COMPARISON:  12/21/12 FINDINGS: Cardiac shadow is stable. A hiatal hernia is again seen. The lungs are clear bilaterally. No acute bony abnormality is noted. IMPRESSION: No active cardiopulmonary disease. Electronically Signed   By: Inez Catalina M.D.   On: 02/08/2016 14:11    Meds  The patient has a current medication list which includes the following prescription(s): aspirin, cetirizine, chlorzoxazone, cholecalciferol, docusate sodium, etodolac, glucosamine chondroitin joint, ibuprofen, lorazepam, pantoprazole, polyethylene glycol powder, and proair hfa.  Current Outpatient Prescriptions on File Prior to Visit  Medication Sig  . aspirin 81 MG tablet Take 81 mg by mouth daily. Reported on 03/03/2016  . cetirizine (ZYRTEC) 10 MG tablet Take 10 mg by mouth daily.  . chlorzoxazone (PARAFON) 500 MG tablet Take 1 tablet (500 mg total) by mouth 3 (three) times daily as needed. (Patient taking differently: Take 500 mg by mouth as needed. )  . Cholecalciferol (VITAMIN D-1000 MAX ST) 1000 units tablet Take 1,000 Units by mouth daily.   [name] docusate sodium (STOOL SOFTENER) 100 MG capsule Take 100 mg by mouth as needed.   . etodolac (LODINE) 500 MG tablet TAKE 1 TABLET (500 MG TOTAL) BY MOUTH 2 (TWO) TIMES DAILY.  [name] Glucos-Chondroit-Hyaluron-MSM (GLUCOSAMINE CHONDROITIN JOINT) TABS Take 2 tablets by mouth daily.  . Ibuprofen (ADVIL PO) Take 500 mg by mouth as  needed.  [name] LORazepam (ATIVAN) 0.5 MG tablet TAKE 1/2 TABLET AT BEDTIME AS NEEDED  . pantoprazole (PROTONIX) 40 MG tablet TAKE 1 TABLET BY MOUTH EVERY DAY  . polyethylene glycol powder (GLYCOLAX/MIRALAX) powder as needed.   [name] PROAIR HFA 108 (90 BASE) MCG/ACT inhaler INHALE 2 PUFFS INTO THE LUNGS EVERY 6 (SIX) HOURS AS NEEDED FOR WHEEZING.   No current facility-administered medications on file prior to visit.    ROS  Constitutional: Denies any fever or chills Gastrointestinal: No reported hemesis, hematochezia, vomiting, or acute GI distress Musculoskeletal: Denies any acute onset joint swelling, redness, loss of ROM, or weakness Neurological: No reported episodes of acute onset apraxia, aphasia, dysarthria, agnosia, amnesia, paralysis, loss of coordination, or loss of consciousness  Allergies  Ms. Mongar is allergic to prevpac and latex.  [place]  Medical:  Ms. Hachey  has a past medical history of GERD (gastroesophageal reflux disease); Allergic rhinitis, cause unspecified; Osteoarthrosis involving, or with mention of more than one site, but not specified as generalized, multiple sites; Hyperlipidemia; DVT (deep venous thrombosis) ([place]); Unspecified venous (peripheral) insufficiency; Gastric ulcer; Complication of anesthesia; PONV (postoperative nausea and vomiting); and Allergy. Family: family history includes Diabetes (age of onset: 2) in her other; Diabetes (age of onset: 15) in her son; Heart disease in her father and mother; Hypertension in her son; Stroke in her brother. There is no history of Hyperlipidemia or Depression. Surgical:  has past surgical history that includes Cholecystectomy (2000);  Breast biopsy (2003); Tonsillectomy and adenoidectomy (childhood); Abdominal hysterectomy (1996); Knee arthroscopy; DEXA (5/14); and Colonoscopy with propofol (N/A, 10/02/2015). Tobacco:  reports that she has quit smoking. Her smoking use included Cigarettes. She has never used smokeless  tobacco. Alcohol:  reports that she does not drink alcohol. Drug:  reports that she does not use illicit drugs.  Constitutional Exam  Vitals: Blood pressure 151/86, pulse 76, temperature 98.1 F (36.7 C), temperature source Oral, resp. rate 18, height 5\' 8"  (1.727 m), weight 246 lb (111.585 kg), SpO2 100 %. General appearance: Well nourished, well developed, and well hydrated. In no acute distress Calculated BMI/Body habitus: Body mass index is 37.41 kg/(m^2). (35-39.9 kg/m2) Severe obesity (Class II) - 136% higher incidence of chronic pain Psych/Mental status: Alert and oriented x 3 (person, place, & time) Eyes: PERLA Respiratory: No evidence of acute respiratory distress  Cervical Spine Exam  Inspection: No masses, redness, or swelling Alignment: Symmetrical ROM: Functional: ROM is within functional limits [facility]) Stability: No instability detected Muscle strength & Tone: Functionally intact Sensory: Unimpaired Palpation: No complaints of tenderness  Upper Extremity (UE) Exam    Side: Right upper extremity  Side: Left upper extremity  Inspection: No masses, redness, swelling, or asymmetry  Inspection: No masses, redness, swelling, or asymmetry  ROM:  ROM:  Functional: ROM is within functional limits [facility])  Functional: ROM is within functional limits [facility])  Muscle strength & Tone: Functionally intact  Muscle strength & Tone: Functionally intact  Sensory: Unimpaired  Sensory: Unimpaired  Palpation: Non-contributory  Palpation: Non-contributory   Thoracic Spine Exam  Inspection: No masses, redness, or swelling Alignment: Symmetrical ROM: Functional: ROM is within functional limits [facility]) Stability: No instability detected Sensory: Unimpaired Muscle strength & Tone: Functionally intact Palpation: No complaints of tenderness  Lumbar Spine Exam  Inspection: No masses, redness, or swelling Alignment: Symmetrical ROM: Functional: Decreased ROM Stability: No instability  detected Muscle strength & Tone: Functionally intact Sensory: Unimpaired Palpation: Tender Provocative Tests: Lumbar Hyperextension and rotation test: Positive for bilateral lumbar facet pain Patrick's Maneuver: deferred  Gait & Posture Assessment  Ambulation: Unassisted Gait: Modified gait pattern (slower gait speed, wider stride width, and longer stance duration) associated with morbid obesity Posture: WNL  Lower Extremity Exam    Side: Right lower extremity  Side: Left lower extremity  Inspection: No masses, redness, swelling, or asymmetry ROM:  Inspection: No masses, redness, swelling, or asymmetry ROM:  Functional: ROM is within functional limits [facility])  Functional: ROM is within functional limits [facility])  Muscle strength & Tone: Functionally intact  Muscle strength & Tone: Functionally intact  Sensory: Unimpaired  Sensory: Unimpaired  Palpation: Non-contributory  Palpation: Non-contributory   Assessment & Plan  Primary Diagnosis & Pertinent Problem List: The primary encounter diagnosis was Chronic low back pain. Diagnoses of Chronic sacroiliac joint pain, Lumbar facet syndrome, and Chronic pain were also pertinent to this visit.  Visit Diagnosis: 1. Chronic low back pain   2. Chronic sacroiliac joint pain   3. Lumbar facet syndrome   4. Chronic pain     Problem-specific Plan(s): No problem-specific assessment & plan notes found for this encounter.   Plan of Care   Problem List Items Addressed This Visit      High   Chronic low back pain - Primary (Chronic)   Chronic pain (Chronic)   Chronic sacroiliac joint pain (Chronic)   Lumbar facet syndrome (Chronic)   Relevant Orders   LUMBAR FACET(MEDIAL BRANCH NERVE BLOCK) MBNB  Pharmacotherapy (Medications Ordered): No orders of the defined types were placed in this encounter.    Lab-work & Procedure Ordered: Orders Placed This Encounter  Procedures  . LUMBAR FACET(MEDIAL BRANCH NERVE BLOCK) MBNB     Standing Status: Future     Number of Occurrences:      Standing Expiration Date: 04/30/2017    Scheduling Instructions:     Side: Bilateral     Level: L2, L3, L4, L5, & S1 Medial Branch Nerve     Sedation: With Sedation.     Timeframe: ASAA    Order Specific Question:  Where will this procedure be performed?    Answer:  [facility]    Imaging Ordered: None  Interventional Therapies: Scheduled:  Diagnostic bilateral lumbar facet block under fluoroscopic guidance and IV sedation #2.    Considering:  Possible bilateral lumbar facet radiofrequency ablation.    PRN Procedures:  None at this time.    Referral(s) or Consult(s): None at this time.  Medications administered during this visit: Ms. Falconer had no medications administered during this visit.  Requested PM Follow-up: Return for Procedure (Scheduled).  Future Appointments Date Time Provider [place]  05/06/2016 10:30 AM [name], MD [facility]    Primary Care Physician: Viviana Simpler, MD Location: [facility] Note by: Kathlen Brunswick. Dossie Arbour, M.D, DABA, DABAPM, DABPM, DABIPP, FIPP  Pain Score Disclaimer: We use the NRS-11 scale. This is a self-reported, subjective measurement of pain severity with only modest accuracy. It is used primarily to identify changes within a particular patient. It must be understood that outpatient pain scales are significantly less accurate that those used for research, where they can be applied under ideal controlled circumstances with minimal exposure to variables. In reality, the score is likely to be a combination of pain intensity and pain affect, where pain affect describes the degree of emotional arousal or changes in action readiness caused by the sensory experience of pain. Factors such as social and work situation, setting, emotional state, anxiety levels, expectation, and prior pain experience may influence pain perception  and show large inter-individual differences that may also be affected by time variables.  Patient instructions provided during this appointment: Patient Instructions  Radiofrequency Lesioning Radiofrequency lesioning is a procedure that is performed to relieve pain. The procedure is often used for back, neck, or arm pain. Radiofrequency lesioning involves the use of a machine that creates radio waves to make heat. During the procedure, the heat is applied to the nerve that carries the pain signal. The heat damages the nerve and interferes with the pain signal. Pain relief usually lasts for 6 months to 1 year. LET [facility] CARE PROVIDER KNOW ABOUT:  Any allergies you have.  All medicines you are taking, including vitamins, herbs, eye drops, creams, and over-the-counter medicines.  Previous problems you or members of your family have had with the use of anesthetics.  Any blood disorders you have.  Previous surgeries you have had.  Any medical conditions you have.  Whether you are pregnant or may be pregnant. RISKS AND COMPLICATIONS Generally, this is a safe procedure. However, problems may occur, including:  Pain or soreness at the injection site.  Infection at the injection site.  Damage to nerves or blood vessels. BEFORE THE PROCEDURE  Ask your health care provider about:  Changing or stopping your regular medicines. This is especially important if you are taking diabetes medicines or blood thinners.  Taking medicines such as aspirin  and ibuprofen. These medicines can thin your blood. Do not take these medicines before your procedure if your health care provider instructs you not to.  Follow instructions from your health care provider about eating or drinking restrictions.  Plan to have someone take you home after the procedure.  If you go home right after the procedure, plan to have someone with you for 24 hours. PROCEDURE  You will be given one or more of the  following:  A medicine to help you relax (sedative).  A medicine to numb the area (local anesthetic).  You will be awake during the procedure. You will need to be able to talk with the health care provider during the procedure.  With the help of a type of X-ray (fluoroscopy), the health care provider will insert a radiofrequency needle into the area to be treated.  Next, a wire that carries the radio waves (electrode) will be put through the radiofrequency needle. An electrical pulse will be sent through the electrode to verify the correct nerve. You will feel a tingling sensation, and you may have muscle twitching.  Then, the tissue that is around the needle tip will be heated by an electric current that is passed using the radiofrequency machine. This will numb the nerves.  A bandage (dressing) will be put on the insertion area after the procedure is done. The procedure may vary among health care providers and hospitals. AFTER THE PROCEDURE  Your blood pressure, heart rate, breathing rate, and blood oxygen level will be monitored often until the medicines you were given have worn off.  Return to your normal activities as directed by your health care provider.   This information is not intended to replace advice given to you by your health care provider. Make sure you discuss any questions you have with your health care provider.   Document Released: 07/30/2011 Document Revised: 08/22/2015 Document Reviewed: 01/08/2015 Elsevier Interactive Patient Education 2016 [place] Facet Blocks Patient Information  Description: The facets are joints in the spine between the vertebrae.  Like any joints in the body, facets can become irritated and painful.  Arthritis can also effect the facets.  By injecting steroids and local anesthetic in and around these joints, we can temporarily block the nerve supply to them.  Steroids act directly on irritated nerves and tissues to reduce selling and  inflammation which often leads to decreased pain.  Facet blocks may be done anywhere along the spine from the neck to the low back depending upon the location of your pain.   After numbing the skin with local anesthetic (like Novocaine), a small needle is passed onto the facet joints under x-ray guidance.  You may experience a sensation of pressure while this is being done.  The entire block usually lasts about 15-25 minutes.   Conditions which may be treated by facet blocks:   Low back/buttock pain  Neck/shoulder pain  Certain types of headaches  Preparation for the injection:   Do not eat any solid food or dairy products within 8 hours of your appointment.  You may drink clear liquid up to 3 hours before appointment.  Clear liquids include water, black coffee, juice or soda.  No milk or cream please.  You may take your regular medication, including pain medications, with a sip of water before your appointment.  Diabetics should hold regular insulin (if taken separately) and take 1/2 normal NPH dose the morning of the procedure.  Carry some sugar containing items with you  to your appointment.  A driver must accompany you and be prepared to drive you home after your procedure.  Bring all your current medications with you.  An IV may be inserted and sedation may be given at the discretion of the physician.  A blood pressure cuff, EKG and other monitors will often be applied during the procedure.  Some patients may need to have extra oxygen administered for a short period.  You will be asked to provide medical information, including your allergies and medications, prior to the procedure.  We must know immediately if you are taking blood thinners (like Coumadin/Warfarin) or if you are allergic to IV iodine contrast (dye).  We must know if you could possible be pregnant.  Possible side-effects:   Bleeding from needle site  Infection (rare, may require surgery)  Nerve injury  (rare)  Numbness & tingling (temporary)  Difficulty urinating (rare, temporary)  Spinal headache (a headache worse with upright posture)  Light-headedness (temporary)  Pain at injection site (serveral days)  Decreased blood pressure (rare, temporary)  Weakness in arm/leg (temporary)  Pressure sensation in back/neck (temporary)   Call if you experience:   Fever/chills associated with headache or increased back/neck pain  Headache worsened by an upright position  New onset, weakness or numbness of an extremity below the injection site  Hives or difficulty breathing (go to the emergency room)  Inflammation or drainage at the injection site(s)  Severe back/neck pain greater than usual  New symptoms which are concerning to you  Please note:  Although the local anesthetic injected can often make your back or neck feel good for several hours after the injection, the pain will likely return. It takes 3-7 days for steroids to work.  You may not notice any pain relief for at least one week.  If effective, we will often do a series of 2-3 injections spaced 3-6 weeks apart to maximally decrease your pain.  After the initial series, you may be a candidate for a more permanent nerve block of the facets.  If you have any questions, please call #[phone]) [facility]

## 2016-05-01 ENCOUNTER — Telehealth: Payer: Self-pay | Admitting: *Deleted

## 2016-05-01 NOTE — Telephone Encounter (Signed)
Pt called requesting information about a procedure. i printed her AVS from 04/30/16 and the information is on there, I made the pt aware that I will mail it to her.Alice KitchenMarland KitchenTD

## 2016-05-06 ENCOUNTER — Ambulatory Visit (INDEPENDENT_AMBULATORY_CARE_PROVIDER_SITE_OTHER): Payer: PPO | Admitting: Internal Medicine

## 2016-05-06 ENCOUNTER — Encounter: Payer: Self-pay | Admitting: Internal Medicine

## 2016-05-06 VITALS — BP 128/72 | HR 78 | Temp 97.4°F | Ht 66.0 in | Wt 244.0 lb

## 2016-05-06 DIAGNOSIS — M545 Low back pain, unspecified: Secondary | ICD-10-CM

## 2016-05-06 DIAGNOSIS — F39 Unspecified mood [affective] disorder: Secondary | ICD-10-CM | POA: Diagnosis not present

## 2016-05-06 DIAGNOSIS — I872 Venous insufficiency (chronic) (peripheral): Secondary | ICD-10-CM

## 2016-05-06 DIAGNOSIS — G8929 Other chronic pain: Secondary | ICD-10-CM

## 2016-05-06 DIAGNOSIS — E785 Hyperlipidemia, unspecified: Secondary | ICD-10-CM | POA: Diagnosis not present

## 2016-05-06 DIAGNOSIS — Z0000 Encounter for general adult medical examination without abnormal findings: Secondary | ICD-10-CM

## 2016-05-06 DIAGNOSIS — Z7189 Other specified counseling: Secondary | ICD-10-CM

## 2016-05-06 LAB — CBC WITH DIFFERENTIAL/PLATELET
Basophils Absolute: 0 10*3/uL (ref 0.0–0.1)
Basophils Relative: 0.4 % (ref 0.0–3.0)
Eosinophils Absolute: 0.2 10*3/uL (ref 0.0–0.7)
Eosinophils Relative: 2.3 % (ref 0.0–5.0)
HEMATOCRIT: 32.8 % — AB (ref 36.0–46.0)
HEMOGLOBIN: 10.1 g/dL — AB (ref 12.0–15.0)
LYMPHS ABS: 1.8 10*3/uL (ref 0.7–4.0)
Lymphocytes Relative: 24.7 % (ref 12.0–46.0)
MCHC: 31 g/dL (ref 30.0–36.0)
MCV: 72.5 fl — ABNORMAL LOW (ref 78.0–100.0)
Monocytes Absolute: 0.7 10*3/uL (ref 0.1–1.0)
Monocytes Relative: 10 % (ref 3.0–12.0)
NEUTROS ABS: 4.6 10*3/uL (ref 1.4–7.7)
Neutrophils Relative %: 62.6 % (ref 43.0–77.0)
Platelets: 339 10*3/uL (ref 150.0–400.0)
RBC: 4.52 Mil/uL (ref 3.87–5.11)
RDW: 17.2 % — AB (ref 11.5–15.5)
WBC: 7.3 10*3/uL (ref 4.0–10.5)

## 2016-05-06 LAB — COMPREHENSIVE METABOLIC PANEL
ALK PHOS: 99 U/L (ref 39–117)
ALT: 15 U/L (ref 0–35)
AST: 17 U/L (ref 0–37)
Albumin: 4.2 g/dL (ref 3.5–5.2)
BUN: 23 mg/dL (ref 6–23)
CHLORIDE: 107 meq/L (ref 96–112)
CO2: 26 mEq/L (ref 19–32)
Calcium: 10.2 mg/dL (ref 8.4–10.5)
Creatinine, Ser: 1.02 mg/dL (ref 0.40–1.20)
GFR: 56.69 mL/min — AB (ref >60.00)
GLUCOSE: 90 mg/dL (ref 70–99)
POTASSIUM: 4.6 meq/L (ref 3.5–5.1)
Sodium: 139 mEq/L (ref 135–145)
TOTAL PROTEIN: 7.1 g/dL (ref 6.0–8.3)
Total Bilirubin: 0.7 mg/dL (ref 0.2–1.2)

## 2016-05-06 LAB — LIPID PANEL
CHOL/HDL RATIO: 4
CHOLESTEROL: 196 mg/dL (ref 0–200)
HDL: 46.2 mg/dL (ref >39.00)
LDL Cholesterol: 132 mg/dL — ABNORMAL HIGH (ref 0–99)
NonHDL: 149.88
TRIGLYCERIDES: 87 mg/dL (ref 0.0–149.0)
VLDL: 17.4 mg/dL (ref 0.0–40.0)

## 2016-05-06 LAB — T4, FREE: Free T4: 1.05 ng/dL (ref 0.60–1.60)

## 2016-05-06 MED ORDER — ALBUTEROL SULFATE HFA 108 (90 BASE) MCG/ACT IN AERS: INHALATION_SPRAY | RESPIRATORY_TRACT | Status: DC

## 2016-05-06 MED ORDER — LORAZEPAM 0.5 MG PO TABS: ORAL_TABLET | ORAL | Status: DC

## 2016-05-06 NOTE — Assessment & Plan Note (Signed)
Working on healthy eating Will recheck Didn't tolerate statin

## 2016-05-06 NOTE — Patient Instructions (Signed)
DASH Eating Plan  DASH stands for "Dietary Approaches to Stop Hypertension." The DASH eating plan is a healthy eating plan that has been shown to reduce high blood pressure (hypertension). Additional health benefits may include reducing the risk of type 2 diabetes mellitus, heart disease, and stroke. The DASH eating plan may also help with weight loss.  WHAT DO I NEED TO KNOW ABOUT THE DASH EATING PLAN?  For the DASH eating plan, you will follow these general guidelines:  · Choose foods with a percent daily value for sodium of less than 5% (as listed on the food label).  · Use salt-free seasonings or herbs instead of table salt or sea salt.  · Check with your health care provider or pharmacist before using salt substitutes.  · Eat lower-sodium products, often labeled as "lower sodium" or "no salt added."  · Eat fresh foods.  · Eat more vegetables, fruits, and low-fat dairy products.  · Choose whole grains. Look for the word "whole" as the first word in the ingredient list.  · Choose fish and skinless chicken or turkey more often than red meat. Limit fish, poultry, and meat to 6 oz (170 g) each day.  · Limit sweets, desserts, sugars, and sugary drinks.  · Choose heart-healthy fats.  · Limit cheese to 1 oz (28 g) per day.  · Eat more home-cooked food and less restaurant, buffet, and fast food.  · Limit fried foods.  · Cook foods using methods other than frying.  · Limit canned vegetables. If you do use them, rinse them well to decrease the sodium.  · When eating at a restaurant, ask that your food be prepared with less salt, or no salt if possible.  WHAT FOODS CAN I EAT?  Seek help from a dietitian for individual calorie needs.  Grains  Whole grain or whole wheat bread. Brown rice. Whole grain or whole wheat pasta. Quinoa, bulgur, and whole grain cereals. Low-sodium cereals. Corn or whole wheat flour tortillas. Whole grain cornbread. Whole grain crackers. Low-sodium crackers.  Vegetables  Fresh or frozen vegetables  (raw, steamed, roasted, or grilled). Low-sodium or reduced-sodium tomato and vegetable juices. Low-sodium or reduced-sodium tomato sauce and paste. Low-sodium or reduced-sodium canned vegetables.   Fruits  All fresh, canned (in natural juice), or frozen fruits.  Meat and Other Protein Products  Ground beef (85% or leaner), grass-fed beef, or beef trimmed of fat. Skinless chicken or turkey. Ground chicken or turkey. Pork trimmed of fat. All fish and seafood. Eggs. Dried beans, peas, or lentils. Unsalted nuts and seeds. Unsalted canned beans.  Dairy  Low-fat dairy products, such as skim or 1% milk, 2% or reduced-fat cheeses, low-fat ricotta or cottage cheese, or plain low-fat yogurt. Low-sodium or reduced-sodium cheeses.  Fats and Oils  Tub margarines without trans fats. Light or reduced-fat mayonnaise and salad dressings (reduced sodium). Avocado. Safflower, olive, or canola oils. Natural peanut or almond butter.  Other  Unsalted popcorn and pretzels.  The items listed above may not be a complete list of recommended foods or beverages. Contact your dietitian for more options.  WHAT FOODS ARE NOT RECOMMENDED?  Grains  White bread. White pasta. White rice. Refined cornbread. Bagels and croissants. Crackers that contain trans fat.  Vegetables  Creamed or fried vegetables. Vegetables in a cheese sauce. Regular canned vegetables. Regular canned tomato sauce and paste. Regular tomato and vegetable juices.  Fruits  Dried fruits. Canned fruit in light or heavy syrup. Fruit juice.  Meat and Other Protein   Products  Fatty cuts of meat. Ribs, chicken wings, bacon, sausage, bologna, salami, chitterlings, fatback, hot dogs, bratwurst, and packaged luncheon meats. Salted nuts and seeds. Canned beans with salt.  Dairy  Whole or 2% milk, cream, half-and-half, and cream cheese. Whole-fat or sweetened yogurt. Full-fat cheeses or blue cheese. Nondairy creamers and whipped toppings. Processed cheese, cheese spreads, or cheese  curds.  Condiments  Onion and garlic salt, seasoned salt, table salt, and sea salt. Canned and packaged gravies. Worcestershire sauce. Tartar sauce. Barbecue sauce. Teriyaki sauce. Soy sauce, including reduced sodium. Steak sauce. Fish sauce. Oyster sauce. Cocktail sauce. Horseradish. Ketchup and mustard. Meat flavorings and tenderizers. Bouillon cubes. Hot sauce. Tabasco sauce. Marinades. Taco seasonings. Relishes.  Fats and Oils  Butter, stick margarine, lard, shortening, ghee, and bacon fat. Coconut, palm kernel, or palm oils. Regular salad dressings.  Other  Pickles and olives. Salted popcorn and pretzels.  The items listed above may not be a complete list of foods and beverages to avoid. Contact your dietitian for more information.  WHERE CAN I FIND MORE INFORMATION?  National Heart, Lung, and [employer] Institute: www.nhlbi.nih.gov/health/health-topics/topics/dash/     This information is not intended to replace advice given to you by your health care provider. Make sure you discuss any questions you have with your health care provider.     Document Released: 11/20/2011 Document Revised: 12/22/2014 Document Reviewed: 10/05/2013  Elsevier Interactive Patient Education ©2016 [employer] Inc.

## 2016-05-06 NOTE — Assessment & Plan Note (Signed)
Chronic dysthymia--not enough for meds Lorazepam for sleep

## 2016-05-06 NOTE — Assessment & Plan Note (Signed)
I have personally reviewed the Medicare Annual Wellness questionnaire and have noted 1. The patient's medical and social history 2. Their use of alcohol, tobacco or illicit drugs 3. Their current medications and supplements 4. The patient's functional ability including ADL's, fall risks, home safety risks and hearing or visual             impairment. 5. Diet and physical activities 6. Evidence for depression or mood disorders  The patients weight, height, BMI and visual acuity have been recorded in the chart I have made referrals, counseling and provided education to the patient based review of the above and I have provided the pt with a written personalized care plan for preventive services.  I have provided you with a copy of your personalized plan for preventive services. Please take the time to review along with your updated medication list.  UTD on imms--yearly flu vaccine Scheduled mammo next month Colon due 11/2020 Info on DASH diet

## 2016-05-06 NOTE — Assessment & Plan Note (Signed)
Swelling is controlled better Hasn't used support hose lately

## 2016-05-06 NOTE — Progress Notes (Signed)
Pre visit review using our clinic review tool, if applicable. No additional management support is needed unless otherwise documented below in the visit note. 

## 2016-05-06 NOTE — Progress Notes (Signed)
Subjective:    Patient ID: Alice Donaldson, female    DOB: Aug 21, 1946, 70 y.o.   MRN: [id number]  HPI Here for Medicare wellness and follow up of chronic health conditions Reviewed form and advanced directives Reviewed other doctors No tobacco or alcohol Not able to exercise--hopes to start swimming program Vision and hearing are fine Fell off shoe once--no major injury (just slight knee bruise) Chronic mood issues Independent with instrumental ADLs No apparent cognitive problems  Ongoing chronic back pain Continues with Dr Dossie Arbour  Has spot on left eyelid Slightly itchy Noticed it 2 weeks ago  Still has ongoing mood issues Limited in activity due to back pain Still stress with husband since his stroke No daily sadness, etc Only uses the lorazepam at night--and occasionally  Takes protonix at bedtime Controls heartburn and reflux No swallowing problems  Hopes to get cholesterol down with low carb diet Didn't tolerate statins  Current Outpatient Prescriptions on File Prior to Visit  Medication Sig Dispense Refill  . aspirin 81 MG tablet Take 81 mg by mouth daily. Reported on 03/03/2016    . cetirizine (ZYRTEC) 10 MG tablet Take 10 mg by mouth daily.    . chlorzoxazone (PARAFON) 500 MG tablet Take 1 tablet (500 mg total) by mouth 3 (three) times daily as needed. (Patient taking differently: Take 500 mg by mouth as needed. ) 30 tablet 0  . Cholecalciferol (VITAMIN D-1000 MAX ST) 1000 units tablet Take 1,000 Units by mouth daily.     [name] docusate sodium (STOOL SOFTENER) 100 MG capsule Take 100 mg by mouth as needed.     . etodolac (LODINE) 500 MG tablet TAKE 1 TABLET (500 MG TOTAL) BY MOUTH 2 (TWO) TIMES DAILY. 180 tablet 2  . Glucos-Chondroit-Hyaluron-MSM (GLUCOSAMINE CHONDROITIN JOINT) TABS Take 2 tablets by mouth daily.    . Ibuprofen (ADVIL PO) Take 500 mg by mouth as needed.    [name] LORazepam (ATIVAN) 0.5 MG tablet TAKE 1/2 TABLET AT BEDTIME AS NEEDED 45 tablet 0  .  pantoprazole (PROTONIX) 40 MG tablet TAKE 1 TABLET BY MOUTH EVERY DAY 90 tablet 3  . polyethylene glycol powder (GLYCOLAX/MIRALAX) powder as needed.     [name] PROAIR HFA 108 (90 BASE) MCG/ACT inhaler INHALE 2 PUFFS INTO THE LUNGS EVERY 6 (SIX) HOURS AS NEEDED FOR WHEEZING. 8.5 each 1   No current facility-administered medications on file prior to visit.    Allergies  Allergen Reactions  . Prevpac [Amoxicill-Clarithro-Lansopraz] Other (See Comments)    Throat closed up  . Latex Rash    Past Medical History  Diagnosis Date  . GERD (gastroesophageal reflux disease)   . Allergic rhinitis, cause unspecified   . Osteoarthrosis involving, or with mention of more than one site, but not specified as generalized, multiple sites   . Hyperlipidemia   . DVT (deep venous thrombosis) ([place])   . Unspecified venous (peripheral) insufficiency   . Gastric ulcer     in past  . Complication of anesthesia   . PONV (postoperative nausea and vomiting)   . Allergy     Past Surgical History  Procedure Laterality Date  . Cholecystectomy  2000  . Breast biopsy  2003  . Tonsillectomy and adenoidectomy  childhood  . Abdominal hysterectomy  1996  . Knee arthroscopy      right in 2007, left in 2011  . Dexa  5/14    Normal  . Colonoscopy with propofol N/A 10/02/2015    Procedure: COLONOSCOPY WITH PROPOFOL;  Surgeon: Lollie Sails, MD;  Location: [facility] ENDOSCOPY;  Service: Endoscopy;  Laterality: N/A;    Family History  Problem Relation Age of Onset  . Heart disease Mother   . Heart disease Father   . Diabetes Son 5    Type 1  . Hypertension Son   . Hyperlipidemia Neg Hx   . Depression Neg Hx   . Stroke Brother   . Diabetes Other 2    type 1    Social History   Social History  . Marital Status: Married    Spouse Name: N/A  . Number of Children: 1  . Years of Education: N/A   Occupational History  . Dental hygienist     retired   Social History Main Topics  . Smoking status: Former  Smoker    Types: Cigarettes  . Smokeless tobacco: Never Used  . Alcohol Use: No     Comment: rare glass of wine  . Drug Use: No  . Sexual Activity: Not on file   Other Topics Concern  . Not on file   Social History Narrative   No living will   Asks for husband to make decisions-- but not sure he can after stroke. Son Joneen Caraway should now do this   Would accept resuscitation   Doesn't want tube feeds if cognitively unaware   Review of Systems Wears seat belt Appetite is okay--trying to be careful. Watching carbs Weight is down 5# since last year Teeth okay--- regular with dentist Kenton Kingfisher) Bowels are still off some--will get hard when limiting fat. Uses stool softener prn Voids well--but may have lost some control with the back injections (urgency more pressing) No other skin problems No chest pain No SOB No dizziness or syncope---but has some mild vertigo when she lies down. Has drainage and post nasal drip--uses zyrtec nightly    Objective:   Physical Exam  Constitutional: She is oriented to person, place, and time. She appears well-developed and well-nourished. No distress.  HENT:  Mouth/Throat: Oropharynx is clear and moist. No oropharyngeal exudate.  Neck: Normal range of motion. Neck supple. No thyromegaly present.  Cardiovascular: Normal rate, regular rhythm, normal heart sounds and intact distal pulses.  Exam reveals no gallop.   No murmur heard. Pulmonary/Chest: Effort normal and breath sounds normal. No respiratory distress. She has no wheezes. She has no rales.  Abdominal: Soft. There is no tenderness.  Musculoskeletal: She exhibits no edema or tenderness.  Lymphadenopathy:    She has no cervical adenopathy.  Neurological: She is alert and oriented to person, place, and time.  President -- "Trump, Obama, Clinton---(then) Bush" [id number] Recall 3/3  Skin: No rash noted. No erythema.  59mm scaly spot on left upper lid--she will see eye doctor if  persists  Psychiatric: She has a normal mood and affect. Her behavior is normal.          Assessment & Plan:

## 2016-05-06 NOTE — Assessment & Plan Note (Signed)
See social history Has blank forms---urged her to do them

## 2016-05-06 NOTE — Assessment & Plan Note (Signed)
Continues to work with Dr Dossie Arbour Same meds

## 2016-05-07 ENCOUNTER — Other Ambulatory Visit: Payer: Self-pay | Admitting: Internal Medicine

## 2016-05-07 DIAGNOSIS — D6489 Other specified anemias: Secondary | ICD-10-CM

## 2016-05-22 ENCOUNTER — Other Ambulatory Visit (INDEPENDENT_AMBULATORY_CARE_PROVIDER_SITE_OTHER): Payer: PPO

## 2016-05-22 DIAGNOSIS — D6489 Other specified anemias: Secondary | ICD-10-CM | POA: Diagnosis not present

## 2016-05-22 LAB — CBC
HCT: 29.2 % — ABNORMAL LOW (ref 36.0–46.0)
HEMOGLOBIN: 9 g/dL — AB (ref 12.0–15.0)
MCHC: 30.8 g/dL (ref 30.0–36.0)
MCV: 71.7 fl — ABNORMAL LOW (ref 78.0–100.0)
Platelets: 315 10*3/uL (ref 150.0–400.0)
RBC: 4.07 Mil/uL (ref 3.87–5.11)
RDW: 17.1 % — AB (ref 11.5–15.5)
WBC: 5.3 10*3/uL (ref 4.0–10.5)

## 2016-05-22 LAB — IBC PANEL
Iron: 6 ug/dL — ABNORMAL LOW (ref 42–145)
Saturation Ratios: 1.2 % — ABNORMAL LOW (ref 20.0–50.0)
Transferrin: 355 mg/dL (ref 212.0–360.0)

## 2016-05-22 LAB — FOLATE: FOLATE: 14.9 ng/mL (ref >5.9)

## 2016-05-22 LAB — FERRITIN: FERRITIN: 6.9 ng/mL — AB (ref 10.0–291.0)

## 2016-05-22 LAB — VITAMIN B12: Vitamin B-12: 835 pg/mL (ref 211–911)

## 2016-05-23 ENCOUNTER — Telehealth: Payer: Self-pay

## 2016-05-23 ENCOUNTER — Encounter: Payer: Self-pay | Admitting: Internal Medicine

## 2016-05-23 NOTE — Telephone Encounter (Signed)
Pt left v/m requesting cb about 05/22/16 lab results before pt goes forward with any treatment or testing. Pt did get mychart message.

## 2016-05-23 NOTE — Telephone Encounter (Signed)
Message sent to patient on MyChart

## 2016-05-23 NOTE — Telephone Encounter (Signed)
Pt returned call. Has multiple injections scheduled in back on Tuesday 6/13 under anaesthesia . Pt wants to know if it is ok to proceed with this procedure at [facility]? Pt thinks she's not bleeding from colon b/c she has no blood in stool. Could it be possible she is bleeding from the injections? Please advise asap

## 2016-05-23 NOTE — Telephone Encounter (Signed)
It would be okay to go ahead with the injections. It is possible that she is having some bleeding from elsewhere in her GI tract--but I don't think it could be related to the injections

## 2016-05-23 NOTE — Telephone Encounter (Signed)
Left message to call the office.

## 2016-05-23 NOTE — Telephone Encounter (Signed)
Please call her No specific brand-- and can get over the counter. There are prescriptions that have both in them--but they tend to cost a lot more (without clearly being any better)

## 2016-05-23 NOTE — Telephone Encounter (Signed)
Spoke to the patient. 1. She said how quickly should she tell the hematologist she needs to get in? Is it ok if it is 4 to 6 weeks out? 2. With her stomach issues, I suggested she try a Flintstone Vitamin with Iron. How many should she take daily? She is aware it will be Monday before I get back with her.

## 2016-05-24 NOTE — Telephone Encounter (Signed)
I asked her to set up with the GI doctor--[place]. That should be in the next couple of weeks----I can set her up at [facility] in [place] if she would prefer She should try at least 2 Flintstones with iron--- and 2 twice a day if she can handle it

## 2016-05-26 NOTE — Telephone Encounter (Signed)
Spoke to the pt. She has an appointment with Dr Gustavo Lah on the 26th. She has already started 2 Flintstones daily and is not having an issue. She will add another Flintstone tonight and see how she does. Then she will add another one at night later on in the week if tolerated.

## 2016-05-27 ENCOUNTER — Ambulatory Visit: Payer: PPO | Attending: Pain Medicine | Admitting: Pain Medicine

## 2016-05-27 ENCOUNTER — Encounter: Payer: Self-pay | Admitting: Pain Medicine

## 2016-05-27 VITALS — BP 130/73 | HR 64 | Temp 97.5°F | Resp 12 | Ht 68.0 in | Wt 244.0 lb

## 2016-05-27 DIAGNOSIS — M549 Dorsalgia, unspecified: Secondary | ICD-10-CM | POA: Diagnosis not present

## 2016-05-27 DIAGNOSIS — E785 Hyperlipidemia, unspecified: Secondary | ICD-10-CM | POA: Diagnosis not present

## 2016-05-27 DIAGNOSIS — G8929 Other chronic pain: Secondary | ICD-10-CM | POA: Insufficient documentation

## 2016-05-27 DIAGNOSIS — J309 Allergic rhinitis, unspecified: Secondary | ICD-10-CM | POA: Insufficient documentation

## 2016-05-27 DIAGNOSIS — H8110 Benign paroxysmal vertigo, unspecified ear: Secondary | ICD-10-CM | POA: Diagnosis not present

## 2016-05-27 DIAGNOSIS — M47816 Spondylosis without myelopathy or radiculopathy, lumbar region: Secondary | ICD-10-CM | POA: Diagnosis not present

## 2016-05-27 DIAGNOSIS — K219 Gastro-esophageal reflux disease without esophagitis: Secondary | ICD-10-CM | POA: Insufficient documentation

## 2016-05-27 DIAGNOSIS — L723 Sebaceous cyst: Secondary | ICD-10-CM | POA: Diagnosis not present

## 2016-05-27 DIAGNOSIS — Z79891 Long term (current) use of opiate analgesic: Secondary | ICD-10-CM | POA: Diagnosis not present

## 2016-05-27 DIAGNOSIS — M545 Low back pain: Secondary | ICD-10-CM | POA: Insufficient documentation

## 2016-05-27 DIAGNOSIS — M533 Sacrococcygeal disorders, not elsewhere classified: Secondary | ICD-10-CM | POA: Diagnosis not present

## 2016-05-27 MED ORDER — FENTANYL CITRATE (PF) 100 MCG/2ML IJ SOLN
25.0000 ug | INTRAMUSCULAR | Status: DC | PRN
  Filled 2016-05-27: qty 2

## 2016-05-27 MED ORDER — TRIAMCINOLONE ACETONIDE 40 MG/ML IJ SUSP
40.0000 mg | Freq: Once | INTRAMUSCULAR | Status: AC
  Administered 2016-05-27: 40 mg
  Filled 2016-05-27: qty 1

## 2016-05-27 MED ORDER — LACTATED RINGERS IV SOLN
1000.0000 mL | Freq: Once | INTRAVENOUS | Status: AC
  Administered 2016-05-27: 1000 mL via INTRAVENOUS

## 2016-05-27 MED ORDER — ROPIVACAINE HCL 2 MG/ML IJ SOLN
9.0000 mL | Freq: Once | INTRAMUSCULAR | Status: AC
  Administered 2016-05-27: 9 mL
  Filled 2016-05-27: qty 10

## 2016-05-27 MED ORDER — LIDOCAINE HCL (PF) 1 % IJ SOLN
10.0000 mL | Freq: Once | INTRAMUSCULAR | Status: AC
  Administered 2016-05-27: 10 mL

## 2016-05-27 MED ORDER — MIDAZOLAM HCL 5 MG/5ML IJ SOLN
1.0000 mg | INTRAMUSCULAR | Status: DC | PRN
  Filled 2016-05-27: qty 5

## 2016-05-27 NOTE — Patient Instructions (Addendum)
Facet Blocks Patient Information  Description: The facets are joints in the spine between the vertebrae.  Like any joints in the body, facets can become irritated and painful.  Arthritis can also effect the facets.  By injecting steroids and local anesthetic in and around these joints, we can temporarily block the nerve supply to them.  Steroids act directly on irritated nerves and tissues to reduce selling and inflammation which often leads to decreased pain.  Facet blocks may be done anywhere along the spine from the neck to the low back depending upon the location of your pain.   After numbing the skin with local anesthetic (like Novocaine), a small needle is passed onto the facet joints under x-ray guidance.  You may experience a sensation of pressure while this is being done.  The entire block usually lasts about 15-25 minutes.   Conditions which may be treated by facet blocks:   Low back/buttock pain  Neck/shoulder pain  Certain types of headaches  Preparation for the injection:  1. Do not eat any solid food or dairy products within 8 hours of your appointment. 2. You may drink clear liquid up to 3 hours before appointment.  Clear liquids include water, black coffee, juice or soda.  No milk or cream please. 3. You may take your regular medication, including pain medications, with a sip of water before your appointment.  Diabetics should hold regular insulin (if taken separately) and take 1/2 normal NPH dose the morning of the procedure.  Carry some sugar containing items with you to your appointment. 4. A driver must accompany you and be prepared to drive you home after your procedure. 5. Bring all your current medications with you. 6. An IV may be inserted and sedation may be given at the discretion of the physician. 7. A blood pressure cuff, EKG and other monitors will often be applied during the procedure.  Some patients may need to have extra oxygen administered for a short  period. 8. You will be asked to provide medical information, including your allergies and medications, prior to the procedure.  We must know immediately if you are taking blood thinners (like Coumadin/Warfarin) or if you are allergic to IV iodine contrast (dye).  We must know if you could possible be pregnant.  Possible side-effects:   Bleeding from needle site  Infection (rare, may require surgery)  Nerve injury (rare)  Numbness & tingling (temporary)  Difficulty urinating (rare, temporary)  Spinal headache (a headache worse with upright posture)  Light-headedness (temporary)  Pain at injection site (serveral days)  Decreased blood pressure (rare, temporary)  Weakness in arm/leg (temporary)  Pressure sensation in back/neck (temporary)   Call if you experience:   Fever/chills associated with headache or increased back/neck pain  Headache worsened by an upright position  New onset, weakness or numbness of an extremity below the injection site  Hives or difficulty breathing (go to the emergency room)  Inflammation or drainage at the injection site(s)  Severe back/neck pain greater than usual  New symptoms which are concerning to you  Please note:  Although the local anesthetic injected can often make your back or neck feel good for several hours after the injection, the pain will likely return. It takes 3-7 days for steroids to work.  You may not notice any pain relief for at least one week.  If effective, we will often do a series of 2-3 injections spaced 3-6 weeks apart to maximally decrease your pain.  After the initial   series, you may be a candidate for a more permanent nerve block of the facets.  If you have any questions, please call #[phone]) [place]: Please fill out the post procedure pain diary and bring it with you at your next appointment. Pain Management Discharge Instructions  General Discharge  Instructions :  If you need to reach your doctor call: Monday-Friday 8:00 am - 4:00 pm at [phone] or toll free [phone].  After clinic hours [phone] to have operator reach doctor.  Bring all of your medication bottles to all your appointments in the pain clinic.  To cancel or reschedule your appointment with Pain Management please remember to call 24 hours in advance to avoid a fee.  Refer to the educational materials which you have been given on: General Risks, I had my Procedure. Discharge Instructions, Post Sedation.  Post Procedure Instructions:  The drugs you were given will stay in your system until tomorrow, so for the next 24 hours you should not drive, make any legal decisions or drink any alcoholic beverages.  You may eat anything you prefer, but it is better to start with liquids then soups and crackers, and gradually work up to solid foods.  Please notify your doctor immediately if you have any unusual bleeding, trouble breathing or pain that is not related to your normal pain.  Depending on the type of procedure that was done, some parts of your body may feel week and/or numb.  This usually clears up by tonight or the next day.  Walk with the use of an assistive device or accompanied by an adult for the 24 hours.  You may use ice on the affected area for the first 24 hours.  Put ice in a Ziploc bag and cover with a towel and place against area 15 minutes on 15 minutes off.  You may switch to heat after 24 hours.  Be careful moving about. Muscle spasms in the area of the injection may occur.  Use ice for the next 24 hours (15 minutes on, 15 minutes off).  After 24 hours, you may use heat for comfort if you wish.  Post procedure numbness or redness is expected, average 4-6 hours.  If numbness develops after 4-6 hours and is felt to be progressing and worsening, immediately contact your physician.

## 2016-05-27 NOTE — Progress Notes (Signed)
Patient states her iron is real low and the doctors suspect she is bleeding internally.  Complains of weakness.  States she is having an endoscopy and colonoscopy in the future.  States she discussed with her PCP as to whether or not to have this procedure and he did not think it would affect anything.

## 2016-05-27 NOTE — Progress Notes (Signed)
Safety precautions to be maintained throughout the outpatient stay will include: orient to surroundings, keep bed in low position, maintain call bell within reach at all times, provide assistance with transfer out of bed and ambulation.  

## 2016-05-27 NOTE — Progress Notes (Signed)
Patient's Name: Alice Donaldson  Patient type: Established  MRN: [id number]  Service setting: Ambulatory outpatient  DOB: 26-Sep-1946  Location: [facility]  DOS: 05/27/2016  Primary Care Physician: Alice Simpler, MD  Note by: Kathlen Brunswick. Dossie Arbour, M.D, DABA, DABAPM, DABPM, Alice Donaldson, FIPP  Referring Physician: Milinda Pointer, MD  Specialty: Board-Certified Interventional Pain Management  Last Visit to Pain Management: 05/01/2016   Primary Reason(s) for Visit: Interventional Pain Management Treatment. CC: Back Pain  Primary Diagnosis: Facet syndrome, lumbar [M54.5]   Procedure:  Anesthesia, Analgesia, Anxiolysis:  Type: Diagnostic Medial Branch Facet Block Region: Lumbar Level: L2, L3, L4, L5, & S1 Medial Branch Level(s) Laterality: Bilateral  Indications: 1. Lumbar facet syndrome   2. Chronic low back pain   3. Lumbar spondylosis, unspecified spinal osteoarthritis     Pre-procedure Pain Score: 2/10 Reported level of pain is compatible with clinical observations Post-procedure Pain Score: 0-No pain  Type: Moderate (Conscious) Sedation & Local Anesthesia Local Anesthetic: Lidocaine 1% Route: Intravenous (IV) IV Access: Secured Sedation: Meaningful verbal contact was maintained at all times during the procedure  Indication(s): Analgesia & Anxiolysis   Pre-Procedure Assessment:  [name] is a 70 y.o. year old, female patient, seen today for interventional treatment. She has GERD (gastroesophageal reflux disease); Allergic rhinitis, cause unspecified; Chronic back pain; Hyperlipidemia; Chronic venous insufficiency; BPPV (benign paroxysmal positional vertigo); Routine general medical examination at a health care facility; Episodic mood disorder ([place]); Sebaceous cyst; Chronic low back pain; Lumbar spondylosis; Lumbar facet syndrome; Chronic sacroiliac joint pain; Chronic pain; Long term current use of opiate analgesic; Long term prescription opiate use; Opiate  use; Encounter for therapeutic drug level monitoring; Encounter for chronic pain management; Chronic myofascial pain; and Advance directive discussed with patient on her problem list.. Her primarily concern today is the Back Pain   Pain Type: Chronic pain Pain Location: Back Pain Orientation: Lower Pain Descriptors / Indicators: Burning Pain Frequency: Constant  Date of Last Visit: 04/30/16 Service Provided on Last Visit: Evaluation  Coagulation Parameters Lab Results  Component Value Date   INR 2.4 10/20/2011   PLT 315.0 05/22/2016    Verification of the correct person, correct site (including marking of site), and correct procedure were performed and confirmed by the patient.  Consent: Secured. Under the influence of no sedatives a written informed consent was obtained, after having provided information on the risks and possible complications. To fulfill our ethical and legal obligations, as recommended by the American Medical Association's Code of Ethics, we have provided information to the patient about our clinical impression; the nature and purpose of the treatment or procedure; the risks, benefits, and possible complications of the intervention; alternatives; the risk(s) and benefit(s) of the alternative treatment(s) or procedure(s); and the risk(s) and benefit(s) of doing nothing. The patient was provided information about the risks and possible complications associated with the procedure. These include, but are not limited to, failure to achieve desired goals, infection, bleeding, organ or nerve damage, allergic reactions, paralysis, and death. In the case of spinal procedures these may include, but are not limited to, failure to achieve desired goals, infection, bleeding, organ or nerve damage, allergic reactions, paralysis, and death. In addition, the patient was informed that Medicine is not an exact science; therefore, there is also the possibility of unforeseen risks and possible  complications that may result in a catastrophic outcome. The patient indicated having understood very clearly. We have given the patient no guarantees and we have made no promises. Enough  time was given to the patient to ask questions, all of which were answered to the patient's satisfaction.  Consent Attestation: I, the ordering provider, attest that I have discussed with the patient the benefits, risks, side-effects, alternatives, likelihood of achieving goals, and potential problems during recovery for the procedure that I have provided informed consent.  Pre-Procedure Preparation: Safety Precautions: Allergies reviewed. Appropriate site, procedure, and patient were confirmed by following the Joint Commission's Universal Protocol (UP.01.01.01), in the form of a "Time Out". The patient was asked to confirm marked site and procedure, before commencing. The patient was asked about blood thinners, or active infections, both of which were denied. Patient was assessed for positional comfort and all pressure points were checked before starting procedure. Allergies: She is allergic to prevpac and latex.. Infection Control Precautions: Sterile technique used. Standard Universal Precautions were taken as recommended by the Department of [facility] for Disease Control and Prevention (CDC). Standard pre-surgical skin prep was conducted. Respiratory hygiene and cough etiquette was practiced. Hand hygiene observed. Safe injection practices and needle disposal techniques followed. SDV (single dose vial) medications used. Medications properly checked for expiration dates and contaminants. Personal protective equipment (PPE) used: Sterile Radiation-resistant gloves. Monitoring:  As per clinic protocol. Filed Vitals:   05/27/16 1337 05/27/16 1347 05/27/16 1357 05/27/16 1407  BP:  129/71 124/72 130/73  Pulse:  69 65 64  Temp:  97.7 F (36.5 C)  97.5 F (36.4 C)  TempSrc:  Temporal  Temporal  Resp: _0 Height:      Weight:      SpO2: 97% 90% 92% 97%  Calculated BMI: Body mass index is 37.11 kg/(m^2).  Description of Procedure Process:   Time-out: "Time-out" completed before starting procedure, as per protocol. Position: Prone Target Area: For Lumbar Facet blocks, the target is the groove formed by the junction of the transverse process and superior articular process. For the L5 dorsal ramus, the target is the notch between superior articular process and sacral ala. For the S1 dorsal ramus, the target is the superior and lateral edge of the posterior S1 Sacral foramen. Approach: Paramedial approach. Area Prepped: Entire Posterior Lumbosacral Region Prepping solution: ChloraPrep (2% chlorhexidine gluconate and 70% isopropyl alcohol) Safety Precautions: Aspiration looking for blood return was conducted prior to all injections. At no point did we inject any substances, as a needle was being advanced. No attempts were made at seeking any paresthesias. Safe injection practices and needle disposal techniques used. Medications properly checked for expiration dates. SDV (single dose vial) medications used. Latex Allergy precautions taken.   Description of the Procedure: Protocol guidelines were followed. The patient was placed in position over the fluoroscopy table. The target area was identified and the area prepped in the usual manner. Skin desensitized using vapocoolant spray. Skin & deeper tissues infiltrated with local anesthetic. Appropriate amount of time allowed to pass for local anesthetics to take effect. The procedure needle was introduced through the skin, ipsilateral to the reported pain, and advanced to the target area. Employing the "Medial Branch Technique", the needles were advanced to the angle made by the superior and medial portion of the transverse process, and the lateral and inferior portion of the superior articulating process of the targeted vertebral bodies. This area is known  as "Burton's Eye" or the "Eye of the [place] Dog". A procedure needle was introduced through the skin, and this time advanced to the angle made by the superior and medial border of the  sacral ala, and the lateral border of the S1 vertebral body. This last needle was later repositioned at the superior and lateral border of the posterior S1 foramen. Negative aspiration confirmed. Solution injected in intermittent fashion, asking for systemic symptoms every 0.5cc of injectate. The needles were then removed and the area cleansed, making sure to leave some of the prepping solution back to take advantage of its long term bactericidal properties. EBL: None Materials & Medications Used:  Needle(s) Used: 22g - 3.5" Spinal Needle(s)  Imaging Guidance:   Type of Imaging Technique: Fluoroscopy Guidance (Spinal) Indication(s): Assistance in needle guidance and placement for procedures requiring needle placement in or near specific anatomical locations not easily accessible without such assistance. Exposure Time: Please see nurses notes. Contrast: None required. Fluoroscopic Guidance: I was personally present in the fluoroscopy suite, where the patient was placed in position for the procedure, over the fluoroscopy-compatible table. Fluoroscopy was manipulated, using "Tunnel Vision Technique", to obtain the best possible view of the target area, on the affected side. Parallax error was corrected before commencing the procedure. A "direction-depth-direction" technique was used to introduce the needle under continuous pulsed fluoroscopic guidance. Once the target was reached, antero-posterior, oblique, and lateral fluoroscopic projection views were taken to confirm needle placement in all planes. Permanently recorded images stored by scanning into EMR. Interpretation: Intraoperative imaging interpretation by performing Physician. Adequate needle placement confirmed. Adequate needle placement confirmed in AP, lateral, &  Oblique Views. No contrast injected.  Antibiotic Prophylaxis:  Indication(s): No indications identified. Type:  Antibiotics Given (last 72 hours)    None       Post-operative Assessment:   Complications: No immediate post-treatment complications were observed. Disposition: Return to clinic for follow-up evaluation. The patient tolerated the entire procedure well. A repeat set of vitals were taken after the procedure and the patient was kept under observation following institutional policy, for this procedure. Post-procedural neurological assessment was performed, showing return to baseline, prior to discharge. The patient was discharged home, once institutional criteria were met. The patient was provided with post-procedure discharge instructions, including a section on how to identify potential problems. Should any problems arise concerning this procedure, the patient was given instructions to immediately contact us, at any time, without hesitation. In any case, we plan to contact the patient by telephone for a follow-up status report regarding this interventional procedure. Comments:  No additional relevant information.  Plan of Care   Problem List Items Addressed This Visit      High   Chronic low back pain (Chronic)   Relevant Medications   fentaNYL (SUBLIMAZE) injection 25-50 mcg   triamcinolone acetonide (KENALOG-40) injection 40 mg (Completed)   Lumbar facet syndrome - Primary (Chronic)   Relevant Medications   fentaNYL (SUBLIMAZE) injection 25-50 mcg   lactated ringers infusion 1,000 mL (Completed)   midazolam (VERSED) 5 MG/5ML injection 1-2 mg   triamcinolone acetonide (KENALOG-40) injection 40 mg (Completed)   lidocaine (PF) (XYLOCAINE) 1 % injection 10 mL (Completed)   ropivacaine (PF) 2 mg/ml (0.2%) (NAROPIN) epidural 9 mL (Completed)   Other Relevant Orders   LUMBAR FACET(MEDIAL BRANCH NERVE BLOCK) MBNB   Lumbar spondylosis (Chronic)   Relevant Medications   fentaNYL  (SUBLIMAZE) injection 25-50 mcg   triamcinolone acetonide (KENALOG-40) injection 40 mg (Completed)       Pharmacotherapy (Medications Ordered): Meds ordered this encounter  Medications  . fentaNYL (SUBLIMAZE) injection 25-50 mcg    Sig:     Make sure Narcan is available in the  pyxis when using this medication. In the event of respiratory depression (RR< 8/min): Titrate NARCAN (naloxone) in increments of 0.1 to 0.2 mg IV at 2-3 minute intervals, until desired degree of reversal.  . lactated ringers infusion 1,000 mL    Sig:   . midazolam (VERSED) 5 MG/5ML injection 1-2 mg    Sig:     Make sure Flumazenil is available in the pyxis when using this medication. If oversedation occurs, administer 0.2 mg IV over 15 sec. If after 45 sec no response, administer 0.2 mg again over 1 min; may repeat at 1 min intervals; not to exceed 4 doses (1 mg)  . triamcinolone acetonide (KENALOG-40) injection 40 mg    Sig:   . lidocaine (PF) (XYLOCAINE) 1 % injection 10 mL    Sig:   . ropivacaine (PF) 2 mg/ml (0.2%) (NAROPIN) epidural 9 mL    Sig:     Lab-work & Procedure Ordered: Orders Placed This Encounter  Procedures  . LUMBAR FACET(MEDIAL BRANCH NERVE BLOCK) MBNB    New medications started at this visit: New Prescriptions   No medications on file    Medications administered during this visit: We administered lactated ringers, triamcinolone acetonide, lidocaine (PF), and ropivacaine (PF) 2 mg/ml (0.2%).  Requested PM Follow-up: Return for Post-Procedure Eval (2 weeks).  Future Appointments Date Time Provider [place]  07/03/2016 9:40 AM Alice Pointer, MD [facility] None  05/07/2017 3:00 PM [name], MD [id number]    Primary Care Physician: Alice Simpler, MD Location: [facility] Note by: Kathlen Brunswick. Dossie Arbour, M.D, DABA, DABAPM, DABPM, DABIPP, FIPP   Illustration of the posterior view of the lumbar spine and the posterior neural  structures. Laminae of L2 through S1 are labeled. DPRL5, dorsal primary ramus of L5; DPRS1, dorsal primary ramus of S1; DPR3, dorsal primary ramus of L3; FJ, facet (zygapophyseal) joint L3-L4; I, inferior articular process of L4; LB1, lateral branch of dorsal primary ramus of L1; IAB, inferior articular branches from L3 medial branch (supplies L4-L5 facet joint); IBP, intermediate branch plexus; MB3, medial branch of dorsal primary ramus of L3; NR3, third lumbar nerve root; S, superior articular process of L5; SAB, superior articular branches from L4 (supplies L4-5 facet joint also); TP3, transverse process of L3.  Disclaimer:  Medicine is not an Chief Strategy Officer. The only guarantee in medicine is that nothing is guaranteed. It is important to note that the decision to proceed with this intervention was based on the information collected from the patient. The Data and conclusions were drawn from the patient's questionnaire, the interview, and the physical examination. Because the information was provided in large part by the patient, it cannot be guaranteed that it has not been purposely or unconsciously manipulated. Every effort has been made to obtain as much relevant data as possible for this evaluation. It is important to note that the conclusions that lead to this procedure are derived in large part from the available data. Always take into account that the treatment will also be dependent on availability of resources and existing treatment guidelines, considered by other Pain Management Practitioners as being common knowledge and practice, at the time of the intervention. For Medico-Legal purposes, it is also important to point out that variation in procedural techniques and pharmacological choices are the acceptable norm. The indications, contraindications, technique, and results of the above procedure should only be interpreted and judged by a Board-Certified Interventional Pain Specialist with extensive  familiarity and expertise in the same  exact procedure and technique. Attempts at providing opinions without similar or greater experience and expertise than that of the treating physician will be considered as inappropriate and unethical, and shall result in a formal complaint to the state medical board and applicable specialty societies.

## 2016-05-28 ENCOUNTER — Telehealth: Payer: Self-pay | Admitting: *Deleted

## 2016-05-28 NOTE — Telephone Encounter (Signed)
Left voicemail for post procedure follow up.

## 2016-06-05 DIAGNOSIS — D509 Iron deficiency anemia, unspecified: Secondary | ICD-10-CM | POA: Diagnosis not present

## 2016-06-09 ENCOUNTER — Other Ambulatory Visit: Payer: Self-pay | Admitting: Internal Medicine

## 2016-06-09 DIAGNOSIS — D509 Iron deficiency anemia, unspecified: Secondary | ICD-10-CM | POA: Diagnosis not present

## 2016-06-09 NOTE — Telephone Encounter (Signed)
Spoke to pt. She will leave it as is for now. Will call if she changes her mind. She is doing well on 4 [employer] vitamins.

## 2016-06-09 NOTE — Telephone Encounter (Signed)
Let her know that it would be okay to have a glass or 2 of wine--she should avoid the muscle relaxer at the same time. I would prefer that the endoscopy be sooner but early September is probably okay. If that is with Corwin Levins can see if we can get earlier in [place] (but the September is probably okay)

## 2016-06-09 NOTE — Telephone Encounter (Signed)
Pt left v/m; pt saw GI doctor and does not have appt for endoscopy until 08/26/16;pt is not happy to wait that long to determine if having internal bleeding and wants to know Dr Alla German opinion of waiting that long for testing. Pt wants to know if she is taking any medications that would prevent pt from drinking couple of glasses of wine for 07/04 party. Pt request cb.

## 2016-06-17 ENCOUNTER — Other Ambulatory Visit: Payer: Self-pay | Admitting: Internal Medicine

## 2016-06-19 DIAGNOSIS — Z1231 Encounter for screening mammogram for malignant neoplasm of breast: Secondary | ICD-10-CM | POA: Diagnosis not present

## 2016-06-20 ENCOUNTER — Encounter: Payer: Self-pay | Admitting: Internal Medicine

## 2016-06-24 DIAGNOSIS — D131 Benign neoplasm of stomach: Secondary | ICD-10-CM | POA: Diagnosis not present

## 2016-06-24 DIAGNOSIS — K259 Gastric ulcer, unspecified as acute or chronic, without hemorrhage or perforation: Secondary | ICD-10-CM | POA: Diagnosis not present

## 2016-06-24 DIAGNOSIS — K3189 Other diseases of stomach and duodenum: Secondary | ICD-10-CM | POA: Diagnosis not present

## 2016-06-24 DIAGNOSIS — K317 Polyp of stomach and duodenum: Secondary | ICD-10-CM | POA: Diagnosis not present

## 2016-06-24 DIAGNOSIS — K449 Diaphragmatic hernia without obstruction or gangrene: Secondary | ICD-10-CM | POA: Diagnosis not present

## 2016-06-24 DIAGNOSIS — K319 Disease of stomach and duodenum, unspecified: Secondary | ICD-10-CM | POA: Diagnosis not present

## 2016-06-24 DIAGNOSIS — K2960 Other gastritis without bleeding: Secondary | ICD-10-CM | POA: Diagnosis not present

## 2016-06-24 DIAGNOSIS — D509 Iron deficiency anemia, unspecified: Secondary | ICD-10-CM | POA: Diagnosis not present

## 2016-06-24 DIAGNOSIS — K2950 Unspecified chronic gastritis without bleeding: Secondary | ICD-10-CM | POA: Diagnosis not present

## 2016-06-24 DIAGNOSIS — K2980 Duodenitis without bleeding: Secondary | ICD-10-CM | POA: Diagnosis not present

## 2016-07-03 ENCOUNTER — Ambulatory Visit: Payer: PPO | Attending: Pain Medicine | Admitting: Pain Medicine

## 2016-07-03 ENCOUNTER — Encounter: Payer: Self-pay | Admitting: Pain Medicine

## 2016-07-03 VITALS — BP 155/86 | HR 82 | Temp 97.8°F | Resp 16 | Ht 68.0 in | Wt 240.0 lb

## 2016-07-03 DIAGNOSIS — M546 Pain in thoracic spine: Secondary | ICD-10-CM | POA: Insufficient documentation

## 2016-07-03 DIAGNOSIS — Z5181 Encounter for therapeutic drug level monitoring: Secondary | ICD-10-CM | POA: Diagnosis not present

## 2016-07-03 DIAGNOSIS — D649 Anemia, unspecified: Secondary | ICD-10-CM | POA: Diagnosis not present

## 2016-07-03 DIAGNOSIS — I739 Peripheral vascular disease, unspecified: Secondary | ICD-10-CM | POA: Diagnosis not present

## 2016-07-03 DIAGNOSIS — M2392 Unspecified internal derangement of left knee: Secondary | ICD-10-CM | POA: Diagnosis not present

## 2016-07-03 DIAGNOSIS — K449 Diaphragmatic hernia without obstruction or gangrene: Secondary | ICD-10-CM | POA: Insufficient documentation

## 2016-07-03 DIAGNOSIS — K219 Gastro-esophageal reflux disease without esophagitis: Secondary | ICD-10-CM | POA: Insufficient documentation

## 2016-07-03 DIAGNOSIS — F39 Unspecified mood [affective] disorder: Secondary | ICD-10-CM | POA: Diagnosis not present

## 2016-07-03 DIAGNOSIS — Z7982 Long term (current) use of aspirin: Secondary | ICD-10-CM | POA: Insufficient documentation

## 2016-07-03 DIAGNOSIS — M533 Sacrococcygeal disorders, not elsewhere classified: Secondary | ICD-10-CM | POA: Diagnosis not present

## 2016-07-03 DIAGNOSIS — M545 Low back pain, unspecified: Secondary | ICD-10-CM

## 2016-07-03 DIAGNOSIS — H8110 Benign paroxysmal vertigo, unspecified ear: Secondary | ICD-10-CM | POA: Diagnosis not present

## 2016-07-03 DIAGNOSIS — G8929 Other chronic pain: Secondary | ICD-10-CM | POA: Insufficient documentation

## 2016-07-03 DIAGNOSIS — M47816 Spondylosis without myelopathy or radiculopathy, lumbar region: Secondary | ICD-10-CM

## 2016-07-03 DIAGNOSIS — E785 Hyperlipidemia, unspecified: Secondary | ICD-10-CM | POA: Diagnosis not present

## 2016-07-03 DIAGNOSIS — Z79891 Long term (current) use of opiate analgesic: Secondary | ICD-10-CM | POA: Insufficient documentation

## 2016-07-03 DIAGNOSIS — J309 Allergic rhinitis, unspecified: Secondary | ICD-10-CM | POA: Diagnosis not present

## 2016-07-03 DIAGNOSIS — M549 Dorsalgia, unspecified: Secondary | ICD-10-CM | POA: Diagnosis not present

## 2016-07-03 DIAGNOSIS — Z87891 Personal history of nicotine dependence: Secondary | ICD-10-CM | POA: Diagnosis not present

## 2016-07-03 DIAGNOSIS — M4186 Other forms of scoliosis, lumbar region: Secondary | ICD-10-CM | POA: Insufficient documentation

## 2016-07-03 NOTE — Progress Notes (Signed)
Safety precautions to be maintained throughout the outpatient stay will include: orient to surroundings, keep bed in low position, maintain call bell within reach at all times, provide assistance with transfer out of bed and ambulation.  Here today for reeval post procedure Pt recently has developed stomach ulcers Protonix 40mg  BID, Anemia and getting iron transfusion starting 07/30/16

## 2016-07-03 NOTE — Progress Notes (Signed)
Patient's Name: Alice Donaldson  Patient type: Established  MRN: [id number]  Service setting: Ambulatory outpatient  DOB: 1946-01-02  Location: [facility]  DOS: 07/03/2016  Primary Care Physician: Viviana Simpler, MD  Note by: Kathlen Brunswick. Dossie Arbour, M.D, DABA, DABAPM, DABPM, DABIPP, FIPP  Referring Physician: [name], MD  Specialty: Board-Certified Interventional Pain Management  Last Visit to Pain Management: 05/28/2016   Primary Reason(s) for Visit: Encounter for post-procedure evaluation of chronic illness with mild to moderate exacerbation CC: Back Pain   HPI  Alice Donaldson is a 70 y.o. year old, female patient, who returns today as an established patient. She has GERD (gastroesophageal reflux disease); Allergic rhinitis, cause unspecified; Chronic back pain; Hyperlipidemia; Chronic venous insufficiency; BPPV (benign paroxysmal positional vertigo); Routine general medical examination at a health care facility; Episodic mood disorder ([place]); Sebaceous cyst; Chronic low back pain; Lumbar spondylosis; Lumbar facet syndrome; Chronic sacroiliac joint pain; Chronic pain; Long term current use of opiate analgesic; Long term prescription opiate use; Opiate use; Encounter for therapeutic drug level monitoring; Encounter for chronic pain management; Chronic myofascial pain; and Advance directive discussed with patient on her problem list.. Her primarily concern today is the Back Pain   Pain Assessment: Self-Reported Pain Score: 3              Reported level is compatible with observation       Pain Type: Chronic pain Pain Location: Back Pain Orientation: Mid Pain Descriptors / Indicators: Aching, Constant, Nagging Pain Frequency: Constant  The patient comes into the clinics today for post-procedure evaluation on the interventional treatment done on 05/27/2016.Based on the results of her second diagnostic injection, the patient would be an excellent candidate for  radiofrequency ablation. Today we went over what type of medication weekly used to help with her pain until she comes back, but she cannot tolerate any NSAIDs due to ulcers, and she cannot tolerate any opioids due to nausea and vomiting. We talked about the possibility of tramadol but she has had tachycardia and chest pains with it, apparently secondary to then norepinephrine stimulation. At this point, the best thing to do is to proceed with the radiofrequency ablation. However, she has been feeling weak and soft due to the bleeding from the ulcers, apparently turns out that she is anemic. She has been getting transfusions and she will like to have this completed before she moves on to the radiofrequency ablation. Because of this, I will be scheduling this as a PRN procedure. She will call when she is ready to move on.  Date of Last Visit: 05/27/16    Post-Procedure Assessment  Procedure done on last visit: Diagnostic bilateral lumbar facet block under fluoroscopic guidance and IV sedation #2. Side-effects or Adverse reactions: None reported Sedation: Sedation given  Results: Ultra-Short Term Relief (First 1 hour after procedure): 100 %  Analgesia during this period is likely to be Local Anesthetic and/or IV Sedative (Analgesic/Anxiolitic) related Short Term Relief (Initial 4-6 hrs after procedure): 100 % Complete relief would confirms area to be the source of pain Long Term Relief : 95 % (left hip 95% better, still has pain in mid back and not gone away) Long-term benefit would suggest an inflammatory etiology to the pain   Current Relief (Now): 90%   Persistent relief would suggest effective anti-inflammatory effects from steroids Interpretation of Results: In view of the results of the diagnostic injections, the patient would be an excellent candidate for lumbar facet RFA.  Laboratory  Chemistry  Inflammation Markers Lab Results  Component Value Date   ESRSEDRATE 9 05/12/2014    Renal  Function Lab Results  Component Value Date   BUN 23 05/06/2016   CREATININE 1.02 05/06/2016   GFRAA >60 05/12/2014   GFRNONAA 55* 05/12/2014    Hepatic Function Lab Results  Component Value Date   AST 17 05/06/2016   ALT 15 05/06/2016   ALBUMIN 4.2 05/06/2016    Electrolytes Lab Results  Component Value Date   NA 139 05/06/2016   K 4.6 05/06/2016   CL 107 05/06/2016   CALCIUM 10.2 05/06/2016   MG 1.9 05/12/2014    Pain Modulating Vitamins Lab Results  Component Value Date   WOEHOZYY48 250 05/22/2016    Coagulation Parameters Lab Results  Component Value Date   INR 2.4 10/20/2011   PLT 315.0 05/22/2016    Cardiovascular Lab Results  Component Value Date   HGB 9.0* 05/22/2016   HCT 29.2* 05/22/2016    Note: Lab results reviewed.  Recent Diagnostic Imaging  Dg Chest 2 View  02/08/2016  CLINICAL DATA:  Cough and shortness of Breath EXAM: CHEST  2 VIEW COMPARISON:  12/21/12 FINDINGS: Cardiac shadow is stable. A hiatal hernia is again seen. The lungs are clear bilaterally. No acute bony abnormality is noted. IMPRESSION: No active cardiopulmonary disease. Electronically Signed   By: Inez Catalina M.D.   On: 02/08/2016 14:11   Lumbosacral Imaging: Lumbar MR wo contrast:  Results for orders placed in visit on 09/25/11  MR Lumbar Spine Wo Contrast   Lumbar DG Bending views:  Results for orders placed in visit on 05/12/14  DG Lumbar Spine Complete W/Bend   Narrative * PRIOR REPORT IMPORTED FROM AN EXTERNAL SYSTEM *   CLINICAL DATA:  Lumbar spine pain. Degenerative disc disease.  Scoliosis.   EXAM:  LUMBAR SPINE - COMPLETE WITH BENDING VIEWS   COMPARISON:  None.   FINDINGS:  There is a moderate levoconvex lumbar curve with the apex at L1-L2.  Severe L2-L3 degenerative disk disease and severe L4-L5 degenerative  disc disease is present. Vertebral body height appears preserved  allowing for the scoliosis.   On the lateral view, anterior facet spurring is  present at L4-L5,  intruding on the posterior aspect of the neural foramina.   Cholecystectomy clips are present in the right upper quadrant. Lower  lumbar facet arthrosis is present. Vacuum disc at L2-L3 and L4-L5.  No gross pars defects. Severe L1-L2 degenerative disease is present  on the lateral view, with vacuum disc and marginal osteophytes.  Standing flexion and extension views show poor range of motion but  no gross pathologic instability.   IMPRESSION:  1. Moderate levoconvex lumbar scoliosis with the apex at L1-L2.  2. Severe multilevel lumbar spondylosis, worst at L1-L2, L2-L3 and  L4-L5.    Electronically Signed    By: Dereck Ligas M.D.    On: 05/12/2014 11:50       Knee Imaging: Knee-L MR w contrast:  Results for orders placed in visit on 11/01/10  MR Knee Left  Wo Contrast   Narrative * PRIOR REPORT IMPORTED FROM AN EXTERNAL SYSTEM *   PRIOR REPORT IMPORTED FROM THE SYNGO WORKFLOW SYSTEM   REASON FOR EXAM:    left knee internal derangement  COMMENTS:   PROCEDURE:     MMR - MMR KNEE LT WO CONTRAST  - Nov 01 2010 11:18AM   RESULT:     Comparison: None.   Technique: Standard knee protocol,  without intravenous contrast.   Findings:  There is a high T2 signal throughout the ACL. However, the ACL fibers  appear  intact. This is nonspecific, but may represent a partial-thickness tear.  The  PCL is intact. There are subcortical cystic changes in the tibial plateau  near the insertion of the ACL.   The MCL and lateral collateral complex are intact.   There is relative deficiency of the posterior horn of the medial meniscus.  There is truncation of the body of the medial meniscus. These may be  postsurgical changes from prior partial meniscectomy. The lateral meniscus  is intact.   The patellofemoral mechanism is intact. Mild edema is seen within Hoffa's  fat pad. Foci of low signal in Hoffa's fat likely represent postsurgical  change. There is a  moderate to large joint effusion. There are areas of  intermediate to high T1 signal which are most consistent with blood  products. There is a small to moderate size Baker's cyst, with findings  that  may represent leakage inferiorly.   There is near full-thickness cartilage loss and subchondral reactive  change  along the medial patellar facet, lateral patellar facet, and apex. Near  full-thickness cartilage loss seen along the trochlea. Partial thickness  cartilage loss seen along the medial femoral condyle and medial tibia  plateau. Partial-thickness cartilage loss seen along the lateral femoral  condyle and lateral tibia plateau.   There is nonspecific edema within the subcutaneous fat.   IMPRESSION:  1. Changes of prior partial meniscectomy versus radial tears of the body  and  posterior horn medial meniscus.  2. Extensive cartilage loss, greatest in the patellofemoral compartment.  3. Moderate to large joint effusion with findings most consistent with a  hemarthrosis. This is nonspecific, but if there is clinical concern for  infection, arthrocentesis is recommended.  4. Findings which may represent partial thickness tear of the ACL.       Note: Imaging results reviewed.  Meds  The patient has a current medication list which includes the following prescription(s): acetaminophen, albuterol, aspirin, cetirizine, chlorzoxazone, cholecalciferol, docusate sodium, etodolac, glucosamine chondroitin joint, lorazepam, and pantoprazole.  Current Outpatient Prescriptions on File Prior to Visit  Medication Sig  . albuterol (PROAIR HFA) 108 (90 Base) MCG/ACT inhaler INHALE 2 PUFFS INTO THE LUNGS EVERY 6 (SIX) HOURS AS NEEDED FOR WHEEZING.  [name] aspirin 81 MG tablet Take 81 mg by mouth daily. Reported on 03/03/2016  . cetirizine (ZYRTEC) 10 MG tablet Take 10 mg by mouth daily.  . chlorzoxazone (PARAFON) 500 MG tablet Take 1 tablet (500 mg total) by mouth 3 (three) times daily as needed.  (Patient taking differently: Take 500 mg by mouth as needed. )  . Cholecalciferol (VITAMIN D-1000 MAX ST) 1000 units tablet Take 1,000 Units by mouth daily.   [name] docusate sodium (STOOL SOFTENER) 100 MG capsule Take 100 mg by mouth as needed.   . etodolac (LODINE) 500 MG tablet TAKE 1 TABLET (500 MG TOTAL) BY MOUTH 2 (TWO) TIMES DAILY.  [name] Glucos-Chondroit-Hyaluron-MSM (GLUCOSAMINE CHONDROITIN JOINT) TABS Take 2 tablets by mouth daily.  [name] LORazepam (ATIVAN) 0.5 MG tablet TAKE 1/2 TABLET AT BEDTIME AS NEEDED  . pantoprazole (PROTONIX) 40 MG tablet TAKE 1 TABLET BY MOUTH EVERY DAY (Patient taking differently: BID)   No current facility-administered medications on file prior to visit.    ROS  Constitutional: Denies any fever or chills Gastrointestinal: No reported hemesis, hematochezia, vomiting, or acute GI distress Musculoskeletal: Denies any acute onset joint  swelling, redness, loss of ROM, or weakness Neurological: No reported episodes of acute onset apraxia, aphasia, dysarthria, agnosia, amnesia, paralysis, loss of coordination, or loss of consciousness  Allergies  Ms. Alkins is allergic to prevpac and latex.  [facility] [place]  Medical:  Ms. Nieblas  has a past medical history of GERD (gastroesophageal reflux disease); Allergic rhinitis, cause unspecified; Osteoarthrosis involving, or with mention of more than one site, but not specified as generalized, multiple sites; Hyperlipidemia; DVT (deep venous thrombosis) ([place]); Unspecified venous (peripheral) insufficiency; Gastric ulcer; Complication of anesthesia; PONV (postoperative nausea and vomiting); Allergy; Anemia; and Ulcer (traumatic) of oral mucosa. Family: family history includes Diabetes (age of onset: 2) in her other; Diabetes (age of onset: 83) in her son; Heart disease in her father and mother; Hypertension in her son; Stroke in her brother. There is no history of Hyperlipidemia or Depression. Surgical:  has past surgical history that includes  Cholecystectomy (2000); Breast biopsy (2003); Tonsillectomy and adenoidectomy (childhood); Abdominal hysterectomy (1996); Knee arthroscopy; DEXA (5/14); and Colonoscopy with propofol (N/A, 10/02/2015). Tobacco:  reports that she has quit smoking. Her smoking use included Cigarettes. She has never used smokeless tobacco. Alcohol:  reports that she does not drink alcohol. Drug:  reports that she does not use illicit drugs.  Constitutional Exam  Vitals: Blood pressure 155/86, pulse 82, temperature 97.8 F (36.6 C), temperature source Oral, resp. rate 16, height 5' 8" (1.727 m), weight 240 lb (108.863 kg), SpO2 100 %. General appearance: Well nourished, well developed, and well hydrated. In no acute distress Calculated BMI/Body habitus: Body mass index is 36.5 kg/(m^2). (35-39.9 kg/m2) Severe obesity (Class II) - 136% higher incidence of chronic pain Psych/Mental status: Alert and oriented x 3 (person, place, & time) Eyes: PERLA Respiratory: No evidence of acute respiratory distress  Cervical Spine Exam  Inspection: No masses, redness, or swelling Alignment: Symmetrical ROM: Functional: ROM is within functional limits [facility]) Stability: No instability detected Muscle strength & Tone: Functionally intact Sensory: Unimpaired Palpation: No complaints of tenderness  Upper Extremity (UE) Exam    Side: Right upper extremity  Side: Left upper extremity  Inspection: No masses, redness, swelling, or asymmetry  Inspection: No masses, redness, swelling, or asymmetry  ROM:  ROM:  Functional: ROM is within functional limits [facility])        Functional: ROM is within functional limits [facility])        Muscle strength & Tone: Functionally intact  Muscle strength & Tone: Functionally intact  Sensory: Unimpaired  Sensory: Unimpaired  Palpation: No complaints of tenderness  Palpation: No complaints of tenderness   Thoracic Spine Exam  Inspection: No masses, redness, or swelling Alignment:  Symmetrical ROM: Functional: ROM is within functional limits [facility]) Stability: No instability detected Sensory: Unimpaired Muscle strength & Tone: Functionally intact Palpation: No complaints of tenderness  Lumbar Spine Exam  Inspection: No masses, redness, or swelling Alignment: Symmetrical ROM: Functional: ROM is within functional limits [facility]) Stability: No instability detected Muscle strength & Tone: Functionally intact Sensory: Unimpaired Palpation: No complaints of tenderness Provocative Tests: Lumbar Hyperextension and rotation test: evaluation deferred today       Patrick's Maneuver: evaluation deferred today              Gait & Posture Assessment  Ambulation: Unassisted Gait: Unaffected Posture: WNL   Lower Extremity Exam    Side: Right lower extremity  Side: Left lower extremity  Inspection: No masses, redness, swelling, or asymmetry ROM:  Inspection: No masses, redness, swelling, or asymmetry ROM:  Functional: ROM is within functional limits [facility])        Functional: ROM is within functional limits [facility])        Muscle strength & Tone: Functionally intact  Muscle strength & Tone: Functionally intact  Sensory: Unimpaired  Sensory: Unimpaired  Palpation: No complaints of tenderness  Palpation: No complaints of tenderness}   Assessment & Plan  Primary Diagnosis & Pertinent Problem List: The primary encounter diagnosis was Chronic pain. Diagnoses of Long term current use of opiate analgesic, Encounter for therapeutic drug level monitoring, Lumbar facet syndrome, and Chronic low back pain were also pertinent to this visit.  Visit Diagnosis: 1. Chronic pain   2. Long term current use of opiate analgesic   3. Encounter for therapeutic drug level monitoring   4. Lumbar facet syndrome   5. Chronic low back pain     Problem-specific Plan(s): No problem-specific assessment & plan notes found for this encounter.   Plan of Care   Problem List Items Addressed This Visit       High   Chronic low back pain (Chronic)   Relevant Medications   acetaminophen (TYLENOL) 500 MG tablet   Other Relevant Orders   Radiofrequency,Lumbar   Chronic pain - Primary (Chronic)   Relevant Medications   acetaminophen (TYLENOL) 500 MG tablet   Lumbar facet syndrome (Chronic)   Relevant Medications   acetaminophen (TYLENOL) 500 MG tablet   Other Relevant Orders   Radiofrequency,Lumbar     Medium   Encounter for therapeutic drug level monitoring   Long term current use of opiate analgesic (Chronic)       Pharmacotherapy (Medications Ordered): No orders of the defined types were placed in this encounter.    Lab-work & Procedure Ordered: Orders Placed This Encounter  Procedures  . Radiofrequency,Lumbar    For low back pain.    Standing Status: Standing     Number of Occurrences: 1     Standing Expiration Date: 07/03/2017    Scheduling Instructions:     Side(s): Bilateral     Level(s): L2, L3, L4, L5, & S1 Medial Branch Nerves     Sedation: With Sedation.     Timeframe: PRN. The patient will call when ready to proceed.    Order Specific Question:  Where will this procedure be performed?    Answer:  [facility]    Imaging Ordered: None  Interventional Therapies: Scheduled:  N/A   Considering:  Bilateral lumbar facet radiofrequency ablation under fluoroscopic guidance and IV sedation.    PRN Procedures:  Bilateral lumbar facet radiofrequency ablation under fluoroscopic guidance and IV sedation. We will do one side first and follow-ups 4- 6 weeks later with the opposite side.   Referral(s) or Consult(s): None at this time.  Medications administered during this visit: Ms. Sitzer had no medications administered during this visit.  Requested PM Follow-up: Return for (PRN) Procedure.  Future Appointments Date Time Provider [place]  07/30/2016 2:00 PM Cammie Sickle, MD [facility] None  05/21/2017 3:00 PM [name], MD  [id number]    Primary Care Physician: Viviana Simpler, MD Location: [facility] Note by: Kathlen Brunswick. Dossie Arbour, M.D, DABA, DABAPM, DABPM, DABIPP, FIPP  Pain Score Disclaimer: We use the NRS-11 scale. This is a self-reported, subjective measurement of pain severity with only modest accuracy. It is used primarily to identify changes within a particular patient. It must be understood that outpatient pain scales are significantly less accurate that those used  for research, where they can be applied under ideal controlled circumstances with minimal exposure to variables. In reality, the score is likely to be a combination of pain intensity and pain affect, where pain affect describes the degree of emotional arousal or changes in action readiness caused by the sensory experience of pain. Factors such as social and work situation, setting, emotional state, anxiety levels, expectation, and prior pain experience may influence pain perception and show large inter-individual differences that may also be affected by time variables.  Patient instructions provided during this appointment: There are no Patient Instructions on file for this visit.

## 2016-07-28 NOTE — Telephone Encounter (Signed)
I think September is okay for the hematologist. Getting the GI studies is the first thing anyway.

## 2016-07-28 NOTE — Telephone Encounter (Signed)
Pt left message on triage VM stating that Hematologist had called and canceled the 07/30/16 appointment and has moved the appointment to September.  Is is OK to wait this long or could she be referred to [facility].  Pt requests call to [phone]

## 2016-07-29 ENCOUNTER — Telehealth: Payer: Self-pay

## 2016-07-29 DIAGNOSIS — D509 Iron deficiency anemia, unspecified: Secondary | ICD-10-CM

## 2016-07-29 NOTE — Telephone Encounter (Signed)
Pt was supposed to start iron infusion at [facility] on 07/30/16; pt was called and 07/30/16 appt was cancelled and rescheduled for Sept. Pt is concerned that her energy level is getting lower and lower and request Dr Silvio Pate to send pt to different office for iron infusions. Pt request cb. Do not send mychart message; pt having computer problems.

## 2016-07-30 ENCOUNTER — Inpatient Hospital Stay: Payer: PPO | Admitting: Internal Medicine

## 2016-07-30 NOTE — Telephone Encounter (Signed)
Can you check to see if she can get in any quicker in [place]?

## 2016-07-30 NOTE — Telephone Encounter (Signed)
Dr. Silvio Pate, Since you did not originally refer to hematology, can you please put in new URGENT referral? I have spoken to Seth Bake at [facility] and she needs new referral placed to get her worked in next week.  [place], Can you please pull up in Care Everywhere and get me the pt's latest labs from Dr. Vira Agar at [facility]?  Thanks

## 2016-07-30 NOTE — Telephone Encounter (Signed)
See referral note

## 2016-07-30 NOTE — Telephone Encounter (Signed)
Referral placed.

## 2016-07-30 NOTE — Telephone Encounter (Signed)
Spoke with pt, Dr. Silvio Pate does want to try to get her scheduled sooner in [place] if possible.  Pt will not be home today, please have [facility] call when scheduled after today.

## 2016-08-04 ENCOUNTER — Encounter: Payer: Self-pay | Admitting: Hematology

## 2016-08-13 ENCOUNTER — Ambulatory Visit: Payer: PPO | Admitting: Hematology

## 2016-08-13 ENCOUNTER — Encounter: Payer: Self-pay | Admitting: Internal Medicine

## 2016-08-14 DIAGNOSIS — D509 Iron deficiency anemia, unspecified: Secondary | ICD-10-CM | POA: Insufficient documentation

## 2016-08-14 NOTE — Progress Notes (Signed)
[place]  [phone] [phone]) [phone]  ID: Alice Donaldson OB: October 30, 1946  MR#: [id number]  [id number]  Patient Care Team: [name], MD as PCP - General (Pediatrics)  CHIEF COMPLAINT: Iron deficiency anemia.  INTERVAL HISTORY: Patient is a 70 year old female as noted have heme positive stools and iron deficiency anemia. She reports her recent EGD that had some nonbleeding ulcers and gastritis, but otherwise no stigmata of bleeding. She currently feels weak and fatigued but otherwise feels well. She has no neurologic complaints. She denies any recent fevers or illnesses. She has a good appetite and denies weight loss. She has no chest pain or shortness of breath. She denies any nausea, vomiting, constipation, or diarrhea. She denies any melena or hematochezia. She has no urinary complaints. Patient otherwise feels well and offers no further specific complaints.  REVIEW OF SYSTEMS:   Review of Systems  Constitutional: Positive for malaise/fatigue. Negative for fever and weight loss.  Respiratory: Negative.  Negative for cough, hemoptysis and shortness of breath.   Cardiovascular: Negative.  Negative for chest pain.  Gastrointestinal: Negative.  Negative for abdominal pain, blood in stool and melena.  Genitourinary: Negative.   Musculoskeletal: Negative.   Neurological: Positive for weakness.  Psychiatric/Behavioral: Negative.  The patient is not nervous/anxious.     As per HPI. Otherwise, a complete review of systems is negatve.  PAST MEDICAL HISTORY: Past Medical History:  Diagnosis Date  . Allergic rhinitis, cause unspecified   . Allergy   . Anemia   . Complication of anesthesia   . DVT (deep venous thrombosis) ([place])   . Gastric ulcer    in past  . GERD (gastroesophageal reflux disease)   . Hyperlipidemia   . Osteoarthrosis involving, or with mention of more than one site, but not specified as generalized, multiple sites   .  PONV (postoperative nausea and vomiting)   . Ulcer (traumatic) of oral mucosa   . Unspecified venous (peripheral) insufficiency     PAST SURGICAL HISTORY: Past Surgical History:  Procedure Laterality Date  . ABDOMINAL HYSTERECTOMY  1996  . BREAST BIOPSY  2003  . CHOLECYSTECTOMY  2000  . COLONOSCOPY WITH PROPOFOL N/A 10/02/2015   Procedure: COLONOSCOPY WITH PROPOFOL;  Surgeon: Lollie Sails, MD;  Location: [facility] ENDOSCOPY;  Service: Endoscopy;  Laterality: N/A;  . DEXA  5/14   Normal  . KNEE ARTHROSCOPY     right in 2007, left in 2011  . TONSILLECTOMY AND ADENOIDECTOMY  childhood    FAMILY HISTORY: Family History  Problem Relation Age of Onset  . Heart disease Mother   . Heart disease Father   . Diabetes Son 5    Type 1  . Hypertension Son   . Hyperlipidemia Neg Hx   . Depression Neg Hx   . Stroke Brother   . Diabetes Other 2    type 1    ADVANCED DIRECTIVES (Y/N):  N  HEALTH MAINTENANCE: Social History  Substance Use Topics  . Smoking status: Former Smoker    Types: Cigarettes  . Smokeless tobacco: Never Used  . Alcohol use No     Comment: rare glass of wine     Colonoscopy:  PAP:  Bone density:  Lipid panel:  Allergies  Allergen Reactions  . Prevpac [Amoxicill-Clarithro-Lansopraz] Other (See Comments)    Throat closed up  . Latex Rash    Current Outpatient Prescriptions  Medication Sig Dispense Refill  . albuterol (PROAIR HFA) 108 (90 Base) MCG/ACT inhaler INHALE 2  PUFFS INTO THE LUNGS EVERY 6 (SIX) HOURS AS NEEDED FOR WHEEZING. 8.5 each 1  . aspirin 81 MG tablet Take 81 mg by mouth daily. Reported on 03/03/2016    . cetirizine (ZYRTEC) 10 MG tablet Take 10 mg by mouth daily.    . chlorzoxazone (PARAFON) 500 MG tablet Take 1 tablet (500 mg total) by mouth 3 (three) times daily as needed. (Patient taking differently: Take 500 mg by mouth as needed. ) 30 tablet 0  . Cholecalciferol (VITAMIN D-1000 MAX ST) 1000 units tablet Take 1,000 Units by mouth  daily.     [name] docusate sodium (STOOL SOFTENER) 100 MG capsule Take 100 mg by mouth 2 (two) times daily.     [name] etodolac (LODINE) 500 MG tablet TAKE 1 TABLET (500 MG TOTAL) BY MOUTH 2 (TWO) TIMES DAILY. 180 tablet 3  . Glucos-Chondroit-Hyaluron-MSM (GLUCOSAMINE CHONDROITIN JOINT) TABS Take 2 tablets by mouth daily.    [name] LORazepam (ATIVAN) 0.5 MG tablet TAKE 1/2 TABLET AT BEDTIME AS NEEDED 45 tablet 0  . pantoprazole (PROTONIX) 40 MG tablet TAKE 1 TABLET BY MOUTH EVERY DAY (Patient taking differently: take 1 tablet by mouth twice a day) 90 tablet 3  . polyethylene glycol (MIRALAX / GLYCOLAX) packet Take 17 g by mouth daily as needed.     No current facility-administered medications for this visit.     OBJECTIVE: Vitals:   08/15/16 1036  BP: (!) 160/88  Pulse: 70  Resp: 18  Temp: 97.7 F (36.5 C)     Body mass index is 37.04 kg/m.    ECOG FS:0 - Asymptomatic  General: Well-developed, well-nourished, no acute distress. Eyes: Pink conjunctiva, anicteric sclera. HEENT: Normocephalic, moist mucous membranes, clear oropharnyx. Lungs: Clear to auscultation bilaterally. Heart: Regular rate and rhythm. No rubs, murmurs, or gallops. Abdomen: Soft, nontender, nondistended. No organomegaly noted, normoactive bowel sounds. Musculoskeletal: No edema, cyanosis, or clubbing. Neuro: Alert, answering all questions appropriately. Cranial nerves grossly intact. Skin: No rashes or petechiae noted. Psych: Normal affect. Lymphatics: No cervical, calvicular, axillary or inguinal LAD.   LAB RESULTS:  Lab Results  Component Value Date   NA 139 05/06/2016   K 4.6 05/06/2016   CL 107 05/06/2016   CO2 26 05/06/2016   GLUCOSE 90 05/06/2016   BUN 23 05/06/2016   CREATININE 1.02 05/06/2016   CALCIUM 10.2 05/06/2016   PROT 7.1 05/06/2016   ALBUMIN 4.2 05/06/2016   AST 17 05/06/2016   ALT 15 05/06/2016   ALKPHOS 99 05/06/2016   BILITOT 0.7 05/06/2016   GFRNONAA 55 (L) 05/12/2014   GFRAA >60  05/12/2014    Lab Results  Component Value Date   WBC 7.6 08/15/2016   NEUTROABS 4.0 08/15/2016   HGB 11.4 (L) 08/15/2016   HCT 36.1 08/15/2016   MCV 77.0 (L) 08/15/2016   PLT 250 08/15/2016   Lab Results  Component Value Date   IRON 6 (L) 05/22/2016   IRONPCTSAT 1.2 (L) 05/22/2016   Lab Results  Component Value Date   FERRITIN 6.9 (L) 05/22/2016     STUDIES: No results found.  ASSESSMENT: Iron deficiency anemia.  PLAN:    1.  Iron deficiency anemia:Although patient's hemoglobin is nearly within normal limits, she continues to have significantly decreased iron stores. She will benefit from IV iron. Return to clinic in 1 and 2 weeks to receive 510 mg IV Feraheme. Patient will then return to clinic in 2 months for repeat laboratory work and further evaluation. If her hemoglobin and iron stores  do not normalize with treatment, will consider a full anemia workup at that time.  2. Hypertension: Continue current medications as prescribed.  Patient expressed understanding and was in agreement with this plan. She also understands that She can call clinic at any time with any questions, concerns, or complaints.    Lloyd Huger, MD   08/15/2016 3:44 PM

## 2016-08-15 ENCOUNTER — Inpatient Hospital Stay: Payer: PPO

## 2016-08-15 ENCOUNTER — Encounter: Payer: Self-pay | Admitting: Oncology

## 2016-08-15 ENCOUNTER — Inpatient Hospital Stay: Payer: PPO | Attending: Oncology | Admitting: Oncology

## 2016-08-15 DIAGNOSIS — I872 Venous insufficiency (chronic) (peripheral): Secondary | ICD-10-CM

## 2016-08-15 DIAGNOSIS — Z79899 Other long term (current) drug therapy: Secondary | ICD-10-CM

## 2016-08-15 DIAGNOSIS — Z87891 Personal history of nicotine dependence: Secondary | ICD-10-CM | POA: Diagnosis not present

## 2016-08-15 DIAGNOSIS — M1990 Unspecified osteoarthritis, unspecified site: Secondary | ICD-10-CM

## 2016-08-15 DIAGNOSIS — K921 Melena: Secondary | ICD-10-CM | POA: Diagnosis not present

## 2016-08-15 DIAGNOSIS — K259 Gastric ulcer, unspecified as acute or chronic, without hemorrhage or perforation: Secondary | ICD-10-CM

## 2016-08-15 DIAGNOSIS — K219 Gastro-esophageal reflux disease without esophagitis: Secondary | ICD-10-CM

## 2016-08-15 DIAGNOSIS — I10 Essential (primary) hypertension: Secondary | ICD-10-CM | POA: Diagnosis not present

## 2016-08-15 DIAGNOSIS — D509 Iron deficiency anemia, unspecified: Secondary | ICD-10-CM

## 2016-08-15 DIAGNOSIS — Z86718 Personal history of other venous thrombosis and embolism: Secondary | ICD-10-CM | POA: Diagnosis not present

## 2016-08-15 DIAGNOSIS — R5383 Other fatigue: Secondary | ICD-10-CM | POA: Diagnosis not present

## 2016-08-15 DIAGNOSIS — K2970 Gastritis, unspecified, without bleeding: Secondary | ICD-10-CM

## 2016-08-15 DIAGNOSIS — E785 Hyperlipidemia, unspecified: Secondary | ICD-10-CM

## 2016-08-15 DIAGNOSIS — R531 Weakness: Secondary | ICD-10-CM | POA: Diagnosis not present

## 2016-08-15 DIAGNOSIS — Z7982 Long term (current) use of aspirin: Secondary | ICD-10-CM

## 2016-08-15 LAB — CBC WITH DIFFERENTIAL/PLATELET
BASOS ABS: 0.1 10*3/uL (ref 0–0.1)
Basophils Relative: 1 %
EOS PCT: 4 %
Eosinophils Absolute: 0.3 10*3/uL (ref 0–0.7)
HCT: 36.1 % (ref 35.0–47.0)
HEMOGLOBIN: 11.4 g/dL — AB (ref 12.0–16.0)
LYMPHS ABS: 2.4 10*3/uL (ref 1.0–3.6)
LYMPHS PCT: 31 %
MCH: 24.3 pg — ABNORMAL LOW (ref 26.0–34.0)
MCHC: 31.6 g/dL — ABNORMAL LOW (ref 32.0–36.0)
MCV: 77 fL — AB (ref 80.0–100.0)
Monocytes Absolute: 0.8 10*3/uL (ref 0.2–0.9)
Monocytes Relative: 11 %
NEUTROS PCT: 53 %
Neutro Abs: 4 10*3/uL (ref 1.4–6.5)
PLATELETS: 250 10*3/uL (ref 150–440)
RBC: 4.69 MIL/uL (ref 3.80–5.20)
RDW: 21.1 % — ABNORMAL HIGH (ref 11.5–14.5)
WBC: 7.6 10*3/uL (ref 3.6–11.0)

## 2016-08-15 LAB — IRON AND TIBC
IRON: 19 ug/dL — AB (ref 28–170)
Saturation Ratios: 4 % — ABNORMAL LOW (ref 10.4–31.8)
TIBC: 439 ug/dL (ref 250–450)
UIBC: 420 ug/dL

## 2016-08-15 LAB — FERRITIN: Ferritin: 9 ng/mL — ABNORMAL LOW (ref 11–307)

## 2016-08-15 NOTE — Progress Notes (Signed)
New evaluation from Dr. Vira Agar for [place]. Complains of dizziness when changing positions.

## 2016-08-20 ENCOUNTER — Ambulatory Visit: Payer: PPO | Admitting: Internal Medicine

## 2016-08-20 ENCOUNTER — Inpatient Hospital Stay: Payer: PPO

## 2016-08-20 VITALS — BP 143/85 | HR 68 | Temp 97.8°F | Resp 18

## 2016-08-20 DIAGNOSIS — D509 Iron deficiency anemia, unspecified: Secondary | ICD-10-CM | POA: Diagnosis not present

## 2016-08-20 MED ORDER — SODIUM CHLORIDE 0.9 % IV SOLN
Freq: Once | INTRAVENOUS | Status: AC
  Administered 2016-08-20: 14:00:00 via INTRAVENOUS
  Filled 2016-08-20: qty 1000

## 2016-08-20 MED ORDER — FERUMOXYTOL INJECTION 510 MG/17 ML
510.0000 mg | Freq: Once | INTRAVENOUS | Status: AC
  Administered 2016-08-20: 510 mg via INTRAVENOUS
  Filled 2016-08-20: qty 17

## 2016-08-25 ENCOUNTER — Encounter: Payer: Self-pay | Admitting: *Deleted

## 2016-08-26 ENCOUNTER — Ambulatory Visit: Admission: RE | Admit: 2016-08-26 | Payer: PPO | Source: Ambulatory Visit | Admitting: Gastroenterology

## 2016-08-26 ENCOUNTER — Encounter: Admission: RE | Payer: Self-pay | Source: Ambulatory Visit

## 2016-08-26 HISTORY — DX: Diverticulosis of intestine, part unspecified, without perforation or abscess without bleeding: K57.90

## 2016-08-26 HISTORY — DX: Other hemorrhoids: K64.8

## 2016-08-26 SURGERY — ESOPHAGOGASTRODUODENOSCOPY (EGD) WITH PROPOFOL
Anesthesia: General

## 2016-08-27 ENCOUNTER — Inpatient Hospital Stay: Payer: PPO

## 2016-08-27 VITALS — BP 140/89 | HR 68 | Temp 97.6°F | Resp 18

## 2016-08-27 DIAGNOSIS — D509 Iron deficiency anemia, unspecified: Secondary | ICD-10-CM

## 2016-08-27 MED ORDER — SODIUM CHLORIDE 0.9 % IV SOLN
Freq: Once | INTRAVENOUS | Status: AC
  Administered 2016-08-27: 14:00:00 via INTRAVENOUS
  Filled 2016-08-27: qty 1000

## 2016-08-27 MED ORDER — SODIUM CHLORIDE 0.9 % IV SOLN
510.0000 mg | Freq: Once | INTRAVENOUS | Status: AC
  Administered 2016-08-27: 510 mg via INTRAVENOUS
  Filled 2016-08-27: qty 17

## 2016-09-10 ENCOUNTER — Other Ambulatory Visit: Payer: Self-pay

## 2016-09-10 MED ORDER — LORAZEPAM 0.5 MG PO TABS: ORAL_TABLET | ORAL | 0 refills | Status: DC

## 2016-09-10 NOTE — Telephone Encounter (Signed)
Pt is in with her husband for an ov today. She is asking for a refill of Lorazepam. She has had to take at night to sleep. Last filled 05-06-16.

## 2016-09-10 NOTE — Telephone Encounter (Signed)
Left refill on voice mail at pharmacy  

## 2016-09-10 NOTE — Telephone Encounter (Signed)
Approved: #30 x 0 1/2-1 at bedtime prn

## 2016-10-13 ENCOUNTER — Telehealth: Payer: Self-pay

## 2016-10-13 DIAGNOSIS — M47816 Spondylosis without myelopathy or radiculopathy, lumbar region: Secondary | ICD-10-CM

## 2016-10-13 DIAGNOSIS — M7918 Myalgia, other site: Secondary | ICD-10-CM

## 2016-10-13 DIAGNOSIS — G8929 Other chronic pain: Secondary | ICD-10-CM

## 2016-10-13 NOTE — Telephone Encounter (Signed)
Pt left v/m; presently pt is getting injections in back for back pain thru pain mgt; there is an area in pts back that the injections do not touch the pain. Pt wants to see rheumatologist; pt has already spoken with office and pt wants to go to [facility] at [facility]. Pt request referral ASAP. Last annual 05/06/16. Dr Silvio Pate is not in office.

## 2016-10-14 NOTE — Telephone Encounter (Signed)
Will await PCP input

## 2016-10-15 NOTE — Progress Notes (Signed)
[place]  [phone]) [phone] [phone]) [phone]  ID: Alice Donaldson OB: 01-02-1946  MR#: [id number]  [id number]  Patient Care Team: [name], MD as PCP - General (Pediatrics)  CHIEF COMPLAINT: Iron deficiency anemia.  INTERVAL HISTORY: Patient returns to clinic today for further evaluation and discussion of her laboratory work. She continues to feel chronically weak and fatigued, but admits this has improved since receiving IV iron 2 months ago. She continues to complain of hair thinning and loss. She otherwise feels well and is asymptomatic. She has no neurologic complaints. She denies any recent fevers or illnesses. She has a good appetite and denies weight loss. She has no chest pain or shortness of breath. She denies any nausea, vomiting, constipation, or diarrhea. She denies any melena or hematochezia. She has no urinary complaints. Patient offers no further specific complaints.  REVIEW OF SYSTEMS:   Review of Systems  Constitutional: Positive for malaise/fatigue. Negative for fever and weight loss.  Respiratory: Negative.  Negative for cough, hemoptysis and shortness of breath.   Cardiovascular: Negative.  Negative for chest pain.  Gastrointestinal: Negative.  Negative for abdominal pain, blood in stool and melena.  Genitourinary: Negative.   Musculoskeletal: Negative.   Neurological: Positive for weakness.  Psychiatric/Behavioral: Negative.  The patient is not nervous/anxious.     As per HPI. Otherwise, a complete review of systems is negative.  PAST MEDICAL HISTORY: Past Medical History:  Diagnosis Date  . Allergic rhinitis, cause unspecified   . Allergy   . Anemia   . Complication of anesthesia   . Diverticulosis   . DVT (deep venous thrombosis) ([place])   . Gastric ulcer    in past  . GERD (gastroesophageal reflux disease)   . Hemorrhoids, internal   . Hyperlipidemia   . Osteoarthrosis involving, or with mention of more than one  site, but not specified as generalized, multiple sites   . PONV (postoperative nausea and vomiting)   . Ulcer (traumatic) of oral mucosa   . Unspecified venous (peripheral) insufficiency     PAST SURGICAL HISTORY: Past Surgical History:  Procedure Laterality Date  . ABDOMINAL HYSTERECTOMY  1996  . BREAST BIOPSY  2003  . CHOLECYSTECTOMY  2000  . COLONOSCOPY WITH PROPOFOL N/A 10/02/2015   Procedure: COLONOSCOPY WITH PROPOFOL;  Surgeon: Lollie Sails, MD;  Location: [facility] ENDOSCOPY;  Service: Endoscopy;  Laterality: N/A;  . DEXA  5/14   Normal  . KNEE ARTHROSCOPY     right in 2007, left in 2011  . TONSILLECTOMY AND ADENOIDECTOMY  childhood    FAMILY HISTORY: Family History  Problem Relation Age of Onset  . Heart disease Mother   . Heart disease Father   . Diabetes Son 5    Type 1  . Hypertension Son   . Stroke Brother   . Diabetes Other 2    type 1  . Hyperlipidemia Neg Hx   . Depression Neg Hx     ADVANCED DIRECTIVES (Y/N):  N  HEALTH MAINTENANCE: Social History  Substance Use Topics  . Smoking status: Former Smoker    Types: Cigarettes  . Smokeless tobacco: Never Used  . Alcohol use No     Comment: rare glass of wine     Colonoscopy:  PAP:  Bone density:  Lipid panel:  Allergies  Allergen Reactions  . Prevpac [Amoxicill-Clarithro-Lansopraz] Other (See Comments)    Throat closed up  . Latex Rash    Current Outpatient Prescriptions  Medication Sig Dispense Refill  .  albuterol (PROAIR HFA) 108 (90 Base) MCG/ACT inhaler INHALE 2 PUFFS INTO THE LUNGS EVERY 6 (SIX) HOURS AS NEEDED FOR WHEEZING. 8.5 each 1  . aspirin 81 MG tablet Take 81 mg by mouth daily. Reported on 03/03/2016    . cetirizine (ZYRTEC) 10 MG tablet Take 10 mg by mouth daily.    . chlorzoxazone (PARAFON) 500 MG tablet Take 1 tablet (500 mg total) by mouth 3 (three) times daily as needed. (Patient taking differently: Take 500 mg by mouth as needed. ) 30 tablet 0  . Cholecalciferol (VITAMIN  D-1000 MAX ST) 1000 units tablet Take 1,000 Units by mouth daily.     [name] docusate sodium (STOOL SOFTENER) 100 MG capsule Take 100 mg by mouth 2 (two) times daily.     [name] etodolac (LODINE) 500 MG tablet TAKE 1 TABLET (500 MG TOTAL) BY MOUTH 2 (TWO) TIMES DAILY. 180 tablet 3  . Glucos-Chondroit-Hyaluron-MSM (GLUCOSAMINE CHONDROITIN JOINT) TABS Take 2 tablets by mouth daily.    [name] LORazepam (ATIVAN) 0.5 MG tablet TAKE 1/2 TO 1 TABLET AT BEDTIME 30 tablet 0  . pantoprazole (PROTONIX) 40 MG tablet TAKE 1 TABLET BY MOUTH EVERY DAY (Patient taking differently: take 1 tablet by mouth twice a day) 90 tablet 3  . polyethylene glycol (MIRALAX / GLYCOLAX) packet Take 17 g by mouth daily as needed.     No current facility-administered medications for this visit.     OBJECTIVE: Vitals:   10/16/16 1350  BP: (!) 156/88  Pulse: 72  Resp: 18  Temp: 97.1 F (36.2 C)     Body mass index is 37.9 kg/m.    ECOG FS:0 - Asymptomatic  General: Well-developed, well-nourished, no acute distress. Eyes: Pink conjunctiva, anicteric sclera. Lungs: Clear to auscultation bilaterally. Heart: Regular rate and rhythm. No rubs, murmurs, or gallops. Abdomen: Soft, nontender, nondistended. No organomegaly noted, normoactive bowel sounds. Musculoskeletal: No edema, cyanosis, or clubbing. Neuro: Alert, answering all questions appropriately. Cranial nerves grossly intact. Skin: No rashes or petechiae noted. Psych: Normal affect.   LAB RESULTS:  Lab Results  Component Value Date   NA 139 05/06/2016   K 4.6 05/06/2016   CL 107 05/06/2016   CO2 26 05/06/2016   GLUCOSE 90 05/06/2016   BUN 23 05/06/2016   CREATININE 1.02 05/06/2016   CALCIUM 10.2 05/06/2016   PROT 7.1 05/06/2016   ALBUMIN 4.2 05/06/2016   AST 17 05/06/2016   ALT 15 05/06/2016   ALKPHOS 99 05/06/2016   BILITOT 0.7 05/06/2016   GFRNONAA 55 (L) 05/12/2014   GFRAA >60 05/12/2014    Lab Results  Component Value Date   WBC 5.3 10/16/2016    NEUTROABS 2.7 10/16/2016   HGB 14.2 10/16/2016   HCT 42.4 10/16/2016   MCV 86.4 10/16/2016   PLT 206 10/16/2016   Lab Results  Component Value Date   IRON 93 10/16/2016   TIBC 325 10/16/2016   IRONPCTSAT 29 10/16/2016   Lab Results  Component Value Date   FERRITIN 47 10/16/2016     STUDIES: No results found.  ASSESSMENT: Iron deficiency anemia.  PLAN:    1.  Iron deficiency anemia:  Patient's hemoglobin and iron stores are now within normal limits. She is also symptomatically improved. Patient does not require additional IV iron today. She last received treatment on August 27, 2016. Return to clinic in 3 months with repeat laboratory work and further evaluation 2. Hair loss: Unclear etiology. Unrelated to iron deficiency.  3. Hypertension: Continue current medications as prescribed.  Patient expressed understanding and was in agreement with this plan. She also understands that She can call clinic at any time with any questions, concerns, or complaints.    Lloyd Huger, MD   10/16/2016 10:42 PM

## 2016-10-16 ENCOUNTER — Inpatient Hospital Stay (HOSPITAL_BASED_OUTPATIENT_CLINIC_OR_DEPARTMENT_OTHER): Payer: PPO | Admitting: Oncology

## 2016-10-16 ENCOUNTER — Inpatient Hospital Stay: Payer: PPO | Attending: Oncology

## 2016-10-16 ENCOUNTER — Inpatient Hospital Stay: Payer: PPO

## 2016-10-16 VITALS — BP 156/88 | HR 72 | Temp 97.1°F | Resp 18 | Wt 249.2 lb

## 2016-10-16 DIAGNOSIS — R5383 Other fatigue: Secondary | ICD-10-CM | POA: Diagnosis not present

## 2016-10-16 DIAGNOSIS — K219 Gastro-esophageal reflux disease without esophagitis: Secondary | ICD-10-CM

## 2016-10-16 DIAGNOSIS — Z87891 Personal history of nicotine dependence: Secondary | ICD-10-CM | POA: Diagnosis not present

## 2016-10-16 DIAGNOSIS — I10 Essential (primary) hypertension: Secondary | ICD-10-CM

## 2016-10-16 DIAGNOSIS — M1990 Unspecified osteoarthritis, unspecified site: Secondary | ICD-10-CM | POA: Insufficient documentation

## 2016-10-16 DIAGNOSIS — E785 Hyperlipidemia, unspecified: Secondary | ICD-10-CM | POA: Diagnosis not present

## 2016-10-16 DIAGNOSIS — Z79899 Other long term (current) drug therapy: Secondary | ICD-10-CM | POA: Diagnosis not present

## 2016-10-16 DIAGNOSIS — Z8711 Personal history of peptic ulcer disease: Secondary | ICD-10-CM | POA: Diagnosis not present

## 2016-10-16 DIAGNOSIS — K5790 Diverticulosis of intestine, part unspecified, without perforation or abscess without bleeding: Secondary | ICD-10-CM

## 2016-10-16 DIAGNOSIS — D509 Iron deficiency anemia, unspecified: Secondary | ICD-10-CM | POA: Insufficient documentation

## 2016-10-16 DIAGNOSIS — Z9049 Acquired absence of other specified parts of digestive tract: Secondary | ICD-10-CM

## 2016-10-16 DIAGNOSIS — L659 Nonscarring hair loss, unspecified: Secondary | ICD-10-CM | POA: Insufficient documentation

## 2016-10-16 DIAGNOSIS — I872 Venous insufficiency (chronic) (peripheral): Secondary | ICD-10-CM | POA: Insufficient documentation

## 2016-10-16 DIAGNOSIS — Z7982 Long term (current) use of aspirin: Secondary | ICD-10-CM | POA: Insufficient documentation

## 2016-10-16 DIAGNOSIS — R531 Weakness: Secondary | ICD-10-CM | POA: Diagnosis not present

## 2016-10-16 DIAGNOSIS — D500 Iron deficiency anemia secondary to blood loss (chronic): Secondary | ICD-10-CM

## 2016-10-16 LAB — CBC WITH DIFFERENTIAL/PLATELET
BASOS ABS: 0.1 10*3/uL (ref 0–0.1)
Basophils Relative: 1 %
EOS PCT: 5 %
Eosinophils Absolute: 0.3 10*3/uL (ref 0–0.7)
HCT: 42.4 % (ref 35.0–47.0)
HEMOGLOBIN: 14.2 g/dL (ref 12.0–16.0)
LYMPHS PCT: 33 %
Lymphs Abs: 1.8 10*3/uL (ref 1.0–3.6)
MCH: 28.8 pg (ref 26.0–34.0)
MCHC: 33.3 g/dL (ref 32.0–36.0)
MCV: 86.4 fL (ref 80.0–100.0)
Monocytes Absolute: 0.5 10*3/uL (ref 0.2–0.9)
Monocytes Relative: 10 %
NEUTROS ABS: 2.7 10*3/uL (ref 1.4–6.5)
NEUTROS PCT: 51 %
PLATELETS: 206 10*3/uL (ref 150–440)
RBC: 4.92 MIL/uL (ref 3.80–5.20)
RDW: 21.8 % — ABNORMAL HIGH (ref 11.5–14.5)
WBC: 5.3 10*3/uL (ref 3.6–11.0)

## 2016-10-16 LAB — IRON AND TIBC
IRON: 93 ug/dL (ref 28–170)
Saturation Ratios: 29 % (ref 10.4–31.8)
TIBC: 325 ug/dL (ref 250–450)
UIBC: 232 ug/dL

## 2016-10-16 LAB — FERRITIN: Ferritin: 47 ng/mL (ref 11–307)

## 2016-10-16 NOTE — Progress Notes (Signed)
States is feeling better today after IV iron. Continues to feel mild fatigue in the afternoon. After last IV iron treatment, pt had side effects of dizziness, lightheadedness, mild nausea. Pt has been having hair loss and was questioning if her hair loss is related to low iron levels.

## 2016-10-16 NOTE — Telephone Encounter (Signed)
Message left It doesn't seem that a rheumatologist is the right referral---it may be better to have her see Dr Sharlet Salina (physiatrist) Will wait to hear back from her but hold off on the referral till we speak.

## 2016-10-17 ENCOUNTER — Telehealth: Payer: Self-pay | Admitting: *Deleted

## 2016-10-17 NOTE — Telephone Encounter (Signed)
She said with first onset of the issue, she saw a chiropractor and it seemed to make it worse. She also did acupuncture and [facility]enzie Therapy for 2 years. She said PT makes it worse. She would be willing to see Dr Sharlet Salina if he may be able to pinpoint a physical issue for her pain.

## 2016-10-17 NOTE — Telephone Encounter (Signed)
Please try to reach her to see what she thinks. I think she would be better off seeing the physiatrist or even a chiropractor (if she hasn't seen one in past)

## 2016-10-17 NOTE — Telephone Encounter (Signed)
Husband will have her call me back

## 2016-10-17 NOTE — Telephone Encounter (Signed)
Patient called to request results from labs drawn yesterday, patient informed that iron stores and ferritin are within normal limits at this time. Patient to keep follow up scheduled for 3 months, will call sooner if needed.

## 2016-10-21 ENCOUNTER — Telehealth: Payer: Self-pay | Admitting: Internal Medicine

## 2016-10-21 NOTE — Telephone Encounter (Signed)
Faxed Referral to Dr Sharlet Salina. He declined to see the patient. He said she should stay with the Pain Clinic. I called the patient and she asked me to ask you if you have any other suggestions.

## 2016-10-22 NOTE — Telephone Encounter (Signed)
I am not really sure. She may want to change focus--- there are some non medical routines, like McKenzie and using inversion table--that she should consider if she hasn't tried. ?acupuncture?

## 2016-10-22 NOTE — Telephone Encounter (Signed)
Spoke to pt. Made appt 10-31-16

## 2016-10-22 NOTE — Telephone Encounter (Signed)
In a previous conversation with her last week, she has tried acupuncture and Stage manager. I am not sure about an Inversion Table.

## 2016-10-22 NOTE — Telephone Encounter (Signed)
Just let her know that I am not sure what else to try. She may want to come in to discuss if there are any other medication options

## 2016-10-31 ENCOUNTER — Encounter: Payer: Self-pay | Admitting: Internal Medicine

## 2016-10-31 ENCOUNTER — Ambulatory Visit (INDEPENDENT_AMBULATORY_CARE_PROVIDER_SITE_OTHER): Payer: PPO | Admitting: Internal Medicine

## 2016-10-31 VITALS — BP 128/80 | HR 74 | Temp 98.0°F | Wt 249.0 lb

## 2016-10-31 DIAGNOSIS — M48062 Spinal stenosis, lumbar region with neurogenic claudication: Secondary | ICD-10-CM

## 2016-10-31 MED ORDER — DULOXETINE HCL 30 MG PO CPEP: 30.0000 mg | ORAL_CAPSULE | Freq: Every day | ORAL | 3 refills | Status: DC

## 2016-10-31 NOTE — Assessment & Plan Note (Signed)
Discussed options--- nortriptyline, other seizure meds (topiramate) and duloxetine/lyrica Reviewed pros and cons of all 15 minute visit Will try duloxetine--since she also has pain related mood issues

## 2016-10-31 NOTE — Progress Notes (Signed)
Subjective:    Patient ID: Alice Donaldson, female    DOB: Dec 03, 1946, 70 y.o.   MRN: [id number]  HPI Here for follow up of chronic pain issues  Reviewed Dr Adalberto Cole noted Did have some shots--helped the hip ---but not the base of her spine Very frustrated by this pain--bad with any activity  Will be "on fire" after being up for just 10 minutes No radiation of pain Concerns due to need for care of husband  Tried gabapentin and tramadol from [place] She had bad reactions to these  Etodolac not really helping much  Current Outpatient Prescriptions on File Prior to Visit  Medication Sig Dispense Refill  . albuterol (PROAIR HFA) 108 (90 Base) MCG/ACT inhaler INHALE 2 PUFFS INTO THE LUNGS EVERY 6 (SIX) HOURS AS NEEDED FOR WHEEZING. 8.5 each 1  . aspirin 81 MG tablet Take 81 mg by mouth daily. Reported on 03/03/2016    . cetirizine (ZYRTEC) 10 MG tablet Take 10 mg by mouth daily.    . chlorzoxazone (PARAFON) 500 MG tablet Take 1 tablet (500 mg total) by mouth 3 (three) times daily as needed. (Patient taking differently: Take 500 mg by mouth as needed. ) 30 tablet 0  . Cholecalciferol (VITAMIN D-1000 MAX ST) 1000 units tablet Take 1,000 Units by mouth daily.     [name] docusate sodium (STOOL SOFTENER) 100 MG capsule Take 100 mg by mouth 2 (two) times daily.     [name] etodolac (LODINE) 500 MG tablet TAKE 1 TABLET (500 MG TOTAL) BY MOUTH 2 (TWO) TIMES DAILY. 180 tablet 3  . Glucos-Chondroit-Hyaluron-MSM (GLUCOSAMINE CHONDROITIN JOINT) TABS Take 2 tablets by mouth daily.    [name] LORazepam (ATIVAN) 0.5 MG tablet TAKE 1/2 TO 1 TABLET AT BEDTIME 30 tablet 0  . pantoprazole (PROTONIX) 40 MG tablet TAKE 1 TABLET BY MOUTH EVERY DAY (Patient taking differently: take 1 tablet by mouth twice a day) 90 tablet 3  . polyethylene glycol (MIRALAX / GLYCOLAX) packet Take 17 g by mouth daily as needed.     No current facility-administered medications on file prior to visit.     Allergies  Allergen Reactions  .  Prevpac [Amoxicill-Clarithro-Lansopraz] Other (See Comments)    Throat closed up  . Latex Rash    Past Medical History:  Diagnosis Date  . Allergic rhinitis, cause unspecified   . Allergy   . Anemia   . Complication of anesthesia   . Diverticulosis   . DVT (deep venous thrombosis) ([place])   . Gastric ulcer    in past  . GERD (gastroesophageal reflux disease)   . Hemorrhoids, internal   . Hyperlipidemia   . Osteoarthrosis involving, or with mention of more than one site, but not specified as generalized, multiple sites   . PONV (postoperative nausea and vomiting)   . Ulcer (traumatic) of oral mucosa   . Unspecified venous (peripheral) insufficiency     Past Surgical History:  Procedure Laterality Date  . ABDOMINAL HYSTERECTOMY  1996  . BREAST BIOPSY  2003  . CHOLECYSTECTOMY  2000  . COLONOSCOPY WITH PROPOFOL N/A 10/02/2015   Procedure: COLONOSCOPY WITH PROPOFOL;  Surgeon: Lollie Sails, MD;  Location: [facility] ENDOSCOPY;  Service: Endoscopy;  Laterality: N/A;  . DEXA  5/14   Normal  . KNEE ARTHROSCOPY     right in 2007, left in 2011  . TONSILLECTOMY AND ADENOIDECTOMY  childhood    Family History  Problem Relation Age of Onset  . Heart disease Mother   .  Heart disease Father   . Diabetes Son 5    Type 1  . Hypertension Son   . Stroke Brother   . Diabetes Other 2    type 1  . Hyperlipidemia Neg Hx   . Depression Neg Hx     Social History   Social History  . Marital status: Married    Spouse name: N/A  . Number of children: 1  . Years of education: N/A   Occupational History  . Dental hygienist     retired   Social History Main Topics  . Smoking status: Former Smoker    Types: Cigarettes  . Smokeless tobacco: Never Used  . Alcohol use No     Comment: rare glass of wine  . Drug use: No  . Sexual activity: Not on file   Other Topics Concern  . Not on file   Social History Narrative   No living will   Asks for husband to make decisions-- but not  sure he can after stroke. Son Joneen Caraway should now do this   Would accept resuscitation   Doesn't want tube feeds if cognitively unaware   Review of Systems Gastric ulcer on EGD-- protonix bid for now Sleeps well since iron stores repleted Had hair loss with the low iron--- now this is better    Objective:   Physical Exam  Psychiatric: She has a normal mood and affect. Her behavior is normal.          Assessment & Plan:

## 2016-10-31 NOTE — Progress Notes (Signed)
Pre visit review using our clinic review tool, if applicable. No additional management support is needed unless otherwise documented below in the visit note. 

## 2016-11-03 ENCOUNTER — Telehealth: Payer: Self-pay

## 2016-11-03 MED ORDER — NORTRIPTYLINE HCL 10 MG PO CAPS: ORAL_CAPSULE | ORAL | 1 refills | Status: DC

## 2016-11-03 NOTE — Telephone Encounter (Signed)
Spoke to pt. She will start taking it next Monday after Thanksgiving and will keep [place] updated.

## 2016-11-03 NOTE — Telephone Encounter (Signed)
Put the duloxetine on intolerance list (not allergy)  Next try is nortriptyline. Send Rx for 10mg  #60 x 1 Have her take 1 tab at bedtime--if no problems after a week, increase to 2 at bedtime Have her keep the follow up and let me know if any problems with this one

## 2016-11-03 NOTE — Telephone Encounter (Signed)
Pt left v/m; pt was seen 10/31/16 and pt was given new med for pain; pt does not know name of med; ? Duloxetine. Pt took med at 11:00 pm and pt woke up at 5 AM with severe nausea, diarrhea and jittery; pt felt bad all day long on Sat. Pt has not taken any more of the med and pt wants to know what other med could be tried. Pt request cb. [facility].

## 2016-11-13 ENCOUNTER — Encounter: Payer: Self-pay | Admitting: Internal Medicine

## 2016-11-29 ENCOUNTER — Other Ambulatory Visit: Payer: Self-pay | Admitting: Internal Medicine

## 2016-12-16 ENCOUNTER — Ambulatory Visit (INDEPENDENT_AMBULATORY_CARE_PROVIDER_SITE_OTHER): Payer: PPO | Admitting: Internal Medicine

## 2016-12-16 ENCOUNTER — Encounter: Payer: Self-pay | Admitting: Internal Medicine

## 2016-12-16 VITALS — BP 150/96 | HR 85 | Temp 97.9°F | Wt 248.0 lb

## 2016-12-16 DIAGNOSIS — H0015 Chalazion left lower eyelid: Secondary | ICD-10-CM | POA: Diagnosis not present

## 2016-12-16 DIAGNOSIS — M48062 Spinal stenosis, lumbar region with neurogenic claudication: Secondary | ICD-10-CM | POA: Diagnosis not present

## 2016-12-16 NOTE — Assessment & Plan Note (Signed)
Discussed Not really bothering her now Will see ophtho if becomes symptomatic

## 2016-12-16 NOTE — Progress Notes (Signed)
Pre visit review using our clinic review tool, if applicable. No additional management support is needed unless otherwise documented below in the visit note. 

## 2016-12-16 NOTE — Progress Notes (Signed)
Subjective:    Patient ID: Alice Donaldson, female    DOB: 07/19/1946, 71 y.o.   MRN: [id number]  HPI Here for follow up of her neurogenic claudication Didn't tolerate the duloxetine at all---dizzy, nausea, upset stomach  Has been on the nortriptyline Tolerating the 10mg  with slight help 20mg  did better--but very dry mouth  Got bad "stye" on left eye lower lid some time ago Didn't see eye doctor--just got cream from pharmacist Still there-- and itchy Not painful or now draining  Current Outpatient Prescriptions on File Prior to Visit  Medication Sig Dispense Refill  . albuterol (PROAIR HFA) 108 (90 Base) MCG/ACT inhaler INHALE 2 PUFFS INTO THE LUNGS EVERY 6 (SIX) HOURS AS NEEDED FOR WHEEZING. 8.5 each 1  . aspirin 81 MG tablet Take 81 mg by mouth daily. Reported on 03/03/2016    . cetirizine (ZYRTEC) 10 MG tablet Take 10 mg by mouth daily.    . chlorzoxazone (PARAFON) 500 MG tablet Take 1 tablet (500 mg total) by mouth 3 (three) times daily as needed. (Patient taking differently: Take 500 mg by mouth as needed. ) 30 tablet 0  . Cholecalciferol (VITAMIN D-1000 MAX ST) 1000 units tablet Take 1,000 Units by mouth daily.     [name] docusate sodium (STOOL SOFTENER) 100 MG capsule Take 100 mg by mouth 2 (two) times daily.     [name] etodolac (LODINE) 500 MG tablet TAKE 1 TABLET (500 MG TOTAL) BY MOUTH 2 (TWO) TIMES DAILY. 180 tablet 3  . Glucos-Chondroit-Hyaluron-MSM (GLUCOSAMINE CHONDROITIN JOINT) TABS Take 2 tablets by mouth daily.    [name] LORazepam (ATIVAN) 0.5 MG tablet TAKE 1/2 TO 1 TABLET AT BEDTIME 30 tablet 0  . nortriptyline (PAMELOR) 10 MG capsule START TAKING 1 CAPSULE AT BEDTIME. IF NO PROBLEMS AFTER 1 WEEK INCREASE TO 2 AT BETIME 60 capsule 5  . pantoprazole (PROTONIX) 40 MG tablet TAKE 1 TABLET BY MOUTH EVERY DAY (Patient taking differently: take 1 tablet by mouth twice a day) 90 tablet 3  . polyethylene glycol (MIRALAX / GLYCOLAX) packet Take 17 g by mouth daily as needed.     No current  facility-administered medications on file prior to visit.     Allergies  Allergen Reactions  . Prevpac [Amoxicill-Clarithro-Lansopraz] Other (See Comments)    Throat closed up  . Latex Rash  . Duloxetine Other (See Comments)    Nausea, diarrhea and jittery    Past Medical History:  Diagnosis Date  . Allergic rhinitis, cause unspecified   . Allergy   . Anemia   . Complication of anesthesia   . Diverticulosis   . DVT (deep venous thrombosis) ([place])   . Gastric ulcer    in past  . GERD (gastroesophageal reflux disease)   . Hemorrhoids, internal   . Hyperlipidemia   . Osteoarthrosis involving, or with mention of more than one site, but not specified as generalized, multiple sites   . PONV (postoperative nausea and vomiting)   . Ulcer (traumatic) of oral mucosa   . Unspecified venous (peripheral) insufficiency     Past Surgical History:  Procedure Laterality Date  . ABDOMINAL HYSTERECTOMY  1996  . BREAST BIOPSY  2003  . CHOLECYSTECTOMY  2000  . COLONOSCOPY WITH PROPOFOL N/A 10/02/2015   Procedure: COLONOSCOPY WITH PROPOFOL;  Surgeon: Lollie Sails, MD;  Location: [facility] ENDOSCOPY;  Service: Endoscopy;  Laterality: N/A;  . DEXA  5/14   Normal  . KNEE ARTHROSCOPY     right in 2007, left in  2011  . TONSILLECTOMY AND ADENOIDECTOMY  childhood    Family History  Problem Relation Age of Onset  . Heart disease Mother   . Heart disease Father   . Diabetes Son 5    Type 1  . Hypertension Son   . Stroke Brother   . Diabetes Other 2    type 1  . Hyperlipidemia Neg Hx   . Depression Neg Hx     Social History   Social History  . Marital status: Married    Spouse name: N/A  . Number of children: 1  . Years of education: N/A   Occupational History  . Dental hygienist     retired   Social History Main Topics  . Smoking status: Former Smoker    Types: Cigarettes  . Smokeless tobacco: Never Used  . Alcohol use No     Comment: rare glass of wine  . Drug use: No    . Sexual activity: Not on file   Other Topics Concern  . Not on file   Social History Narrative   No living will   Asks for husband to make decisions-- but not sure he can after stroke. Son Joneen Caraway should now do this   Would accept resuscitation   Doesn't want tube feeds if cognitively unaware   Review of Systems  Vision is okay Sleeps much better on nortriptyline     Objective:   Physical Exam  Eyes:  Small non inflamed mass in medial left lower lid  Psychiatric: She has a normal mood and affect. Her behavior is normal.          Assessment & Plan:

## 2016-12-16 NOTE — Assessment & Plan Note (Signed)
Better with the nortriptyline She will try various things to tolerate the dry mouth with the higher dose

## 2017-01-19 ENCOUNTER — Inpatient Hospital Stay: Payer: PPO | Attending: Oncology

## 2017-01-19 DIAGNOSIS — Z7982 Long term (current) use of aspirin: Secondary | ICD-10-CM | POA: Insufficient documentation

## 2017-01-19 DIAGNOSIS — E785 Hyperlipidemia, unspecified: Secondary | ICD-10-CM | POA: Insufficient documentation

## 2017-01-19 DIAGNOSIS — K649 Unspecified hemorrhoids: Secondary | ICD-10-CM | POA: Diagnosis not present

## 2017-01-19 DIAGNOSIS — I739 Peripheral vascular disease, unspecified: Secondary | ICD-10-CM | POA: Insufficient documentation

## 2017-01-19 DIAGNOSIS — Z87891 Personal history of nicotine dependence: Secondary | ICD-10-CM | POA: Diagnosis not present

## 2017-01-19 DIAGNOSIS — Z79899 Other long term (current) drug therapy: Secondary | ICD-10-CM | POA: Insufficient documentation

## 2017-01-19 DIAGNOSIS — K219 Gastro-esophageal reflux disease without esophagitis: Secondary | ICD-10-CM | POA: Insufficient documentation

## 2017-01-19 DIAGNOSIS — M1990 Unspecified osteoarthritis, unspecified site: Secondary | ICD-10-CM | POA: Insufficient documentation

## 2017-01-19 DIAGNOSIS — D509 Iron deficiency anemia, unspecified: Secondary | ICD-10-CM | POA: Insufficient documentation

## 2017-01-19 DIAGNOSIS — I10 Essential (primary) hypertension: Secondary | ICD-10-CM | POA: Diagnosis not present

## 2017-01-19 DIAGNOSIS — Z8711 Personal history of peptic ulcer disease: Secondary | ICD-10-CM | POA: Insufficient documentation

## 2017-01-19 DIAGNOSIS — Z8719 Personal history of other diseases of the digestive system: Secondary | ICD-10-CM | POA: Diagnosis not present

## 2017-01-19 DIAGNOSIS — Z86718 Personal history of other venous thrombosis and embolism: Secondary | ICD-10-CM | POA: Insufficient documentation

## 2017-01-19 DIAGNOSIS — L659 Nonscarring hair loss, unspecified: Secondary | ICD-10-CM | POA: Diagnosis not present

## 2017-01-19 DIAGNOSIS — D500 Iron deficiency anemia secondary to blood loss (chronic): Secondary | ICD-10-CM

## 2017-01-19 LAB — CBC WITH DIFFERENTIAL/PLATELET
BASOS PCT: 1 %
Basophils Absolute: 0.1 10*3/uL (ref 0–0.1)
Eosinophils Absolute: 0.3 10*3/uL (ref 0–0.7)
Eosinophils Relative: 5 %
HCT: 44.7 % (ref 35.0–47.0)
HEMOGLOBIN: 15.1 g/dL (ref 12.0–16.0)
Lymphocytes Relative: 31 %
Lymphs Abs: 2 10*3/uL (ref 1.0–3.6)
MCH: 30.8 pg (ref 26.0–34.0)
MCHC: 33.8 g/dL (ref 32.0–36.0)
MCV: 91 fL (ref 80.0–100.0)
Monocytes Absolute: 0.7 10*3/uL (ref 0.2–0.9)
Monocytes Relative: 11 %
Neutro Abs: 3.3 10*3/uL (ref 1.4–6.5)
Neutrophils Relative %: 52 %
Platelets: 249 10*3/uL (ref 150–440)
RBC: 4.91 MIL/uL (ref 3.80–5.20)
RDW: 12.9 % (ref 11.5–14.5)
WBC: 6.3 10*3/uL (ref 3.6–11.0)

## 2017-01-19 LAB — IRON AND TIBC
IRON: 64 ug/dL (ref 28–170)
SATURATION RATIOS: 19 % (ref 10.4–31.8)
TIBC: 341 ug/dL (ref 250–450)
UIBC: 277 ug/dL

## 2017-01-19 LAB — FERRITIN: Ferritin: 34 ng/mL (ref 11–307)

## 2017-01-19 NOTE — Progress Notes (Signed)
[place]  [phone] [phone]  ID: Alice Donaldson OB: Nov 23, 1946  MR#: [id number]  [id number]  Patient Care Team: [name], MD as PCP - General (Pediatrics)  CHIEF COMPLAINT: Iron deficiency anemia.  INTERVAL HISTORY: Patient returns to clinic today for further evaluation and discussion of her laboratory work. She currently feels well and is asymptomatic. She continues to complain of hair thinning and loss. She has no neurologic complaints. She denies any recent fevers or illnesses. She has a good appetite and denies weight loss. She has no chest pain or shortness of breath. She denies any nausea, vomiting, constipation, or diarrhea. She denies any melena or hematochezia. She has no urinary complaints. Patient offers no further specific complaints.  REVIEW OF SYSTEMS:   Review of Systems  Constitutional: Negative for fever, malaise/fatigue and weight loss.  Respiratory: Negative.  Negative for cough, hemoptysis and shortness of breath.   Cardiovascular: Negative.  Negative for chest pain and leg swelling.  Gastrointestinal: Negative.  Negative for abdominal pain, blood in stool and melena.  Genitourinary: Negative.   Musculoskeletal: Negative.   Neurological: Negative.  Negative for weakness.  Psychiatric/Behavioral: Negative.  The patient is not nervous/anxious.     As per HPI. Otherwise, a complete review of systems is negative.  PAST MEDICAL HISTORY: Past Medical History:  Diagnosis Date  . Allergic rhinitis, cause unspecified   . Allergy   . Anemia   . Complication of anesthesia   . Diverticulosis   . DVT (deep venous thrombosis) ([place])   . Gastric ulcer    in past  . GERD (gastroesophageal reflux disease)   . Hemorrhoids, internal   . Hyperlipidemia   . Osteoarthrosis involving, or with mention of more than one site, but not specified as generalized, multiple sites   . PONV (postoperative nausea and vomiting)   .  Ulcer (traumatic) of oral mucosa   . Unspecified venous (peripheral) insufficiency     PAST SURGICAL HISTORY: Past Surgical History:  Procedure Laterality Date  . ABDOMINAL HYSTERECTOMY  1996  . BREAST BIOPSY  2003  . CHOLECYSTECTOMY  2000  . COLONOSCOPY WITH PROPOFOL N/A 10/02/2015   Procedure: COLONOSCOPY WITH PROPOFOL;  Surgeon: Lollie Sails, MD;  Location: [facility] ENDOSCOPY;  Service: Endoscopy;  Laterality: N/A;  . DEXA  5/14   Normal  . KNEE ARTHROSCOPY     right in 2007, left in 2011  . TONSILLECTOMY AND ADENOIDECTOMY  childhood    FAMILY HISTORY: Family History  Problem Relation Age of Onset  . Heart disease Mother   . Heart disease Father   . Diabetes Son 5    Type 1  . Hypertension Son   . Stroke Brother   . Diabetes Other 2    type 1  . Hyperlipidemia Neg Hx   . Depression Neg Hx     ADVANCED DIRECTIVES (Y/N):  N  HEALTH MAINTENANCE: Social History  Substance Use Topics  . Smoking status: Former Smoker    Types: Cigarettes  . Smokeless tobacco: Never Used  . Alcohol use No     Comment: rare glass of wine     Colonoscopy:  PAP:  Bone density:  Lipid panel:  Allergies  Allergen Reactions  . Prevpac [Amoxicill-Clarithro-Lansopraz] Other (See Comments)    Throat closed up  . Latex Rash  . Duloxetine Other (See Comments)    Nausea, diarrhea and jittery    Current Outpatient Prescriptions  Medication Sig Dispense Refill  . albuterol (PROAIR  HFA) 108 (90 Base) MCG/ACT inhaler INHALE 2 PUFFS INTO THE LUNGS EVERY 6 (SIX) HOURS AS NEEDED FOR WHEEZING. 8.5 each 1  . aspirin 81 MG tablet Take 81 mg by mouth daily. Reported on 03/03/2016    . cetirizine (ZYRTEC) 10 MG tablet Take 10 mg by mouth daily.    . chlorzoxazone (PARAFON) 500 MG tablet Take 1 tablet (500 mg total) by mouth 3 (three) times daily as needed. (Patient taking differently: Take 500 mg by mouth as needed. ) 30 tablet 0  . Cholecalciferol (VITAMIN D-1000 MAX ST) 1000 units tablet Take  1,000 Units by mouth daily.     [name] docusate sodium (STOOL SOFTENER) 100 MG capsule Take 100 mg by mouth 2 (two) times daily.     [name] etodolac (LODINE) 500 MG tablet TAKE 1 TABLET (500 MG TOTAL) BY MOUTH 2 (TWO) TIMES DAILY. 180 tablet 3  . Glucos-Chondroit-Hyaluron-MSM (GLUCOSAMINE CHONDROITIN JOINT) TABS Take 2 tablets by mouth daily.    [name] LORazepam (ATIVAN) 0.5 MG tablet TAKE 1/2 TO 1 TABLET AT BEDTIME 30 tablet 0  . nortriptyline (PAMELOR) 10 MG capsule START TAKING 1 CAPSULE AT BEDTIME. IF NO PROBLEMS AFTER 1 WEEK INCREASE TO 2 AT BETIME 60 capsule 5  . pantoprazole (PROTONIX) 40 MG tablet TAKE 1 TABLET BY MOUTH EVERY DAY (Patient taking differently: take 1 tablet by mouth twice a day) 90 tablet 3  . polyethylene glycol (MIRALAX / GLYCOLAX) packet Take 17 g by mouth daily as needed.     No current facility-administered medications for this visit.     OBJECTIVE: Vitals:   01/20/17 1434 01/20/17 1520  BP: (!) 160/113 (!) 144/98  Pulse: 86   Resp: 18   Temp: 97.3 F (36.3 C)      Body mass index is 37.9 kg/m.    ECOG FS:0 - Asymptomatic  General: Well-developed, well-nourished, no acute distress. Eyes: Pink conjunctiva, anicteric sclera. Lungs: Clear to auscultation bilaterally. Heart: Regular rate and rhythm. No rubs, murmurs, or gallops. Abdomen: Soft, nontender, nondistended. No organomegaly noted, normoactive bowel sounds. Musculoskeletal: No edema, cyanosis, or clubbing. Neuro: Alert, answering all questions appropriately. Cranial nerves grossly intact. Skin: No rashes or petechiae noted. Psych: Normal affect.   LAB RESULTS:  Lab Results  Component Value Date   NA 139 05/06/2016   K 4.6 05/06/2016   CL 107 05/06/2016   CO2 26 05/06/2016   GLUCOSE 90 05/06/2016   BUN 23 05/06/2016   CREATININE 1.02 05/06/2016   CALCIUM 10.2 05/06/2016   PROT 7.1 05/06/2016   ALBUMIN 4.2 05/06/2016   AST 17 05/06/2016   ALT 15 05/06/2016   ALKPHOS 99 05/06/2016   BILITOT 0.7  05/06/2016   GFRNONAA 55 (L) 05/12/2014   GFRAA >60 05/12/2014    Lab Results  Component Value Date   WBC 6.3 01/19/2017   NEUTROABS 3.3 01/19/2017   HGB 15.1 01/19/2017   HCT 44.7 01/19/2017   MCV 91.0 01/19/2017   PLT 249 01/19/2017   Lab Results  Component Value Date   IRON 64 01/19/2017   TIBC 341 01/19/2017   IRONPCTSAT 19 01/19/2017   Lab Results  Component Value Date   FERRITIN 34 01/19/2017     STUDIES: No results found.  ASSESSMENT: Iron deficiency anemia.  PLAN:    1.  Iron deficiency anemia:  Patient's hemoglobin and iron stores are now within normal limits. She is also symptomatically improved. Patient does not require additional IV iron today. She last received treatment on August 27, 2016. Patient has requested no further follow-up. Please refer patient back if there are any questions or concerns. 2. Hair loss: Unclear etiology. Unrelated to iron deficiency.  3. Hypertension: Continue current medications as prescribed.  Patient expressed understanding and was in agreement with this plan. She also understands that She can call clinic at any time with any questions, concerns, or complaints.    Lloyd Huger, MD   01/25/2017 7:46 AM

## 2017-01-20 ENCOUNTER — Inpatient Hospital Stay: Payer: PPO

## 2017-01-20 ENCOUNTER — Inpatient Hospital Stay (HOSPITAL_BASED_OUTPATIENT_CLINIC_OR_DEPARTMENT_OTHER): Payer: PPO | Admitting: Oncology

## 2017-01-20 VITALS — BP 144/98 | HR 86 | Temp 97.3°F | Resp 18 | Wt 249.2 lb

## 2017-01-20 DIAGNOSIS — M1990 Unspecified osteoarthritis, unspecified site: Secondary | ICD-10-CM

## 2017-01-20 DIAGNOSIS — K219 Gastro-esophageal reflux disease without esophagitis: Secondary | ICD-10-CM | POA: Diagnosis not present

## 2017-01-20 DIAGNOSIS — I739 Peripheral vascular disease, unspecified: Secondary | ICD-10-CM

## 2017-01-20 DIAGNOSIS — Z7982 Long term (current) use of aspirin: Secondary | ICD-10-CM

## 2017-01-20 DIAGNOSIS — Z8719 Personal history of other diseases of the digestive system: Secondary | ICD-10-CM

## 2017-01-20 DIAGNOSIS — Z86718 Personal history of other venous thrombosis and embolism: Secondary | ICD-10-CM

## 2017-01-20 DIAGNOSIS — Z8711 Personal history of peptic ulcer disease: Secondary | ICD-10-CM

## 2017-01-20 DIAGNOSIS — I10 Essential (primary) hypertension: Secondary | ICD-10-CM

## 2017-01-20 DIAGNOSIS — K649 Unspecified hemorrhoids: Secondary | ICD-10-CM

## 2017-01-20 DIAGNOSIS — L659 Nonscarring hair loss, unspecified: Secondary | ICD-10-CM | POA: Diagnosis not present

## 2017-01-20 DIAGNOSIS — D509 Iron deficiency anemia, unspecified: Secondary | ICD-10-CM

## 2017-01-20 DIAGNOSIS — Z87891 Personal history of nicotine dependence: Secondary | ICD-10-CM

## 2017-01-20 DIAGNOSIS — Z79899 Other long term (current) drug therapy: Secondary | ICD-10-CM

## 2017-01-20 DIAGNOSIS — D500 Iron deficiency anemia secondary to blood loss (chronic): Secondary | ICD-10-CM

## 2017-01-20 DIAGNOSIS — E785 Hyperlipidemia, unspecified: Secondary | ICD-10-CM

## 2017-01-20 NOTE — Progress Notes (Signed)
Patient is here for follow up, she is doing ok, she has been having headaches when waking up. Her bp today was high and will recheck.

## 2017-03-20 ENCOUNTER — Other Ambulatory Visit: Payer: Self-pay | Admitting: Internal Medicine

## 2017-03-20 NOTE — Telephone Encounter (Signed)
Approved: okay to refill at bid x 1 year Let her know that if she hasn't tried to reduce the amount she takes lately, she should probably try again (like starting with just 1 pill for 2 days a week)

## 2017-03-20 NOTE — Telephone Encounter (Signed)
Rx sent electronically. Spoke to pt. She will try once a day to see how she does.

## 2017-03-20 NOTE — Telephone Encounter (Signed)
Pt left v/m; pt previously got pantoprazole from Dr Vira Agar and pt request Dr Silvio Pate to refill. On med list pantoprazole 40 mg is take one daily but pt has been taking med bid.pt has 2 pills left.Last annual 05/06/16.Please advise. [facility].

## 2017-05-07 ENCOUNTER — Encounter: Payer: PPO | Admitting: Internal Medicine

## 2017-05-21 ENCOUNTER — Encounter: Payer: PPO | Admitting: Internal Medicine

## 2017-05-31 ENCOUNTER — Other Ambulatory Visit: Payer: Self-pay | Admitting: Internal Medicine

## 2017-06-03 ENCOUNTER — Ambulatory Visit (INDEPENDENT_AMBULATORY_CARE_PROVIDER_SITE_OTHER): Payer: PPO | Admitting: Internal Medicine

## 2017-06-03 ENCOUNTER — Encounter: Payer: Self-pay | Admitting: Internal Medicine

## 2017-06-03 VITALS — BP 128/86 | HR 83 | Temp 97.6°F | Ht 66.0 in | Wt 249.0 lb

## 2017-06-03 DIAGNOSIS — F39 Unspecified mood [affective] disorder: Secondary | ICD-10-CM | POA: Diagnosis not present

## 2017-06-03 DIAGNOSIS — Z0000 Encounter for general adult medical examination without abnormal findings: Secondary | ICD-10-CM

## 2017-06-03 DIAGNOSIS — Z7189 Other specified counseling: Secondary | ICD-10-CM

## 2017-06-03 DIAGNOSIS — M48062 Spinal stenosis, lumbar region with neurogenic claudication: Secondary | ICD-10-CM

## 2017-06-03 DIAGNOSIS — I872 Venous insufficiency (chronic) (peripheral): Secondary | ICD-10-CM

## 2017-06-03 DIAGNOSIS — D500 Iron deficiency anemia secondary to blood loss (chronic): Secondary | ICD-10-CM

## 2017-06-03 DIAGNOSIS — Z6841 Body Mass Index (BMI) 40.0 and over, adult: Secondary | ICD-10-CM | POA: Diagnosis not present

## 2017-06-03 DIAGNOSIS — Z23 Encounter for immunization: Secondary | ICD-10-CM | POA: Diagnosis not present

## 2017-06-03 LAB — COMPREHENSIVE METABOLIC PANEL
ALBUMIN: 4.2 g/dL (ref 3.5–5.2)
ALK PHOS: 138 U/L — AB (ref 39–117)
ALT: 18 U/L (ref 0–35)
AST: 19 U/L (ref 0–37)
BUN: 14 mg/dL (ref 6–23)
CALCIUM: 9.8 mg/dL (ref 8.4–10.5)
CHLORIDE: 104 meq/L (ref 96–112)
CO2: 29 mEq/L (ref 19–32)
CREATININE: 0.84 mg/dL (ref 0.40–1.20)
GFR: 70.71 mL/min (ref >60.00)
Glucose, Bld: 95 mg/dL (ref 70–99)
Potassium: 4.2 mEq/L (ref 3.5–5.1)
Sodium: 140 mEq/L (ref 135–145)
TOTAL PROTEIN: 7.1 g/dL (ref 6.0–8.3)
Total Bilirubin: 0.7 mg/dL (ref 0.2–1.2)

## 2017-06-03 LAB — CBC WITH DIFFERENTIAL/PLATELET
BASOS PCT: 0.9 % (ref 0.0–3.0)
Basophils Absolute: 0.1 10*3/uL (ref 0.0–0.1)
EOS ABS: 0.3 10*3/uL (ref 0.0–0.7)
Eosinophils Relative: 5.8 % — ABNORMAL HIGH (ref 0.0–5.0)
HEMATOCRIT: 44 % (ref 36.0–46.0)
HEMOGLOBIN: 14.6 g/dL (ref 12.0–15.0)
LYMPHS PCT: 33 % (ref 12.0–46.0)
Lymphs Abs: 1.9 10*3/uL (ref 0.7–4.0)
MCHC: 33.3 g/dL (ref 30.0–36.0)
MCV: 92 fl (ref 78.0–100.0)
MONO ABS: 0.6 10*3/uL (ref 0.1–1.0)
Monocytes Relative: 10.5 % (ref 3.0–12.0)
Neutro Abs: 2.9 10*3/uL (ref 1.4–7.7)
Neutrophils Relative %: 49.8 % (ref 43.0–77.0)
Platelets: 264 10*3/uL (ref 150.0–400.0)
RBC: 4.78 Mil/uL (ref 3.87–5.11)
RDW: 13.5 % (ref 11.5–15.5)
WBC: 5.8 10*3/uL (ref 4.0–10.5)

## 2017-06-03 LAB — FERRITIN: Ferritin: 16.7 ng/mL (ref 10.0–291.0)

## 2017-06-03 MED ORDER — CHLORZOXAZONE 500 MG PO TABS: 500.0000 mg | ORAL_TABLET | Freq: Three times a day (TID) | ORAL | 1 refills | Status: DC | PRN

## 2017-06-03 NOTE — Assessment & Plan Note (Signed)
Better now Will recheck labs

## 2017-06-03 NOTE — Assessment & Plan Note (Signed)
Wears hose prn

## 2017-06-03 NOTE — Assessment & Plan Note (Signed)
Pain is better with nortriptyline Hasn't needed pain visits lately

## 2017-06-03 NOTE — Assessment & Plan Note (Signed)
See social history Blank forms given 

## 2017-06-03 NOTE — Assessment & Plan Note (Addendum)
Mild mood issues related to chronic pain and husband's care No new meds needed--lorazepam for prn

## 2017-06-03 NOTE — Assessment & Plan Note (Signed)
Weight stable but height down Discussed healthy eating and at least some chair exercises Goal to lose 10# per year

## 2017-06-03 NOTE — Assessment & Plan Note (Signed)
I have personally reviewed the Medicare Annual Wellness questionnaire and have noted 1. The patient's medical and social history 2. Their use of alcohol, tobacco or illicit drugs 3. Their current medications and supplements 4. The patient's functional ability including ADL's, fall risks, home safety risks and hearing or visual             impairment. 5. Diet and physical activities 6. Evidence for depression or mood disorders  The patients weight, height, BMI and visual acuity have been recorded in the chart I have made referrals, counseling and provided education to the patient based review of the above and I have provided the pt with a written personalized care plan for preventive services.  I have provided you with a copy of your personalized plan for preventive services. Please take the time to review along with your updated medication list.  Colon due 2021 Mammogram due next year Discussed some exercise Will update pneumovax and yearly flu vaccine in fall

## 2017-06-03 NOTE — Progress Notes (Signed)
Subjective:    Patient ID: Alice Donaldson, female    DOB: November 05, 1946, 71 y.o.   MRN: [id number]  HPI Here for Medicare wellness visit and follow up of chronic health conditions Reviewed form and advanced directives Reviewed other doctors No alcohol or tobacco Not really doing any exercise Vision is okay Hearing is okay--but has chronic tinnitus No falls in past year Chronic mood issues Limits instrumental ADLs---like doesn't scrub tub, etc. housecleaner No apparent memory issues--just stress related  Ongoing mood issues related to dealing with her husband Still has had problems since his stroke Ongoing cognitive issues and dependent on her  Tolerating the single nortriptyline Couldn't tolerate 20mg  due dry mouth It "knocks the edge off" Etodolac and glucosamine. chlorzoxazone also--but fairly rarely Hasn't seen Dr Dossie Arbour lately  Takes protonix only daily now Will get some stomach cramps with certain food No regular heartburn or dysphagia  Still sees Dr Grayland Ormond No longer anemic Will recheck today  Chronic edema Fairly stable Inconsistent with compression hose---now due to heat  Current Outpatient Prescriptions on File Prior to Visit  Medication Sig Dispense Refill  . albuterol (PROAIR HFA) 108 (90 Base) MCG/ACT inhaler INHALE 2 PUFFS INTO THE LUNGS EVERY 6 (SIX) HOURS AS NEEDED FOR WHEEZING. 8.5 each 1  . aspirin 81 MG tablet Take 81 mg by mouth daily. Reported on 03/03/2016    . cetirizine (ZYRTEC) 10 MG tablet Take 10 mg by mouth daily.    . chlorzoxazone (PARAFON) 500 MG tablet Take 1 tablet (500 mg total) by mouth 3 (three) times daily as needed. (Patient taking differently: Take 500 mg by mouth as needed. ) 30 tablet 0  . Cholecalciferol (VITAMIN D-1000 MAX ST) 1000 units tablet Take 1,000 Units by mouth daily.     [name] docusate sodium (STOOL SOFTENER) 100 MG capsule Take 100 mg by mouth 2 (two) times daily.     [name] etodolac (LODINE) 500 MG tablet TAKE 1 TABLET (500  MG TOTAL) BY MOUTH 2 (TWO) TIMES DAILY. 180 tablet 1  . Glucos-Chondroit-Hyaluron-MSM (GLUCOSAMINE CHONDROITIN JOINT) TABS Take 2 tablets by mouth daily.    [name] LORazepam (ATIVAN) 0.5 MG tablet TAKE 1/2 TO 1 TABLET AT BEDTIME 30 tablet 0  . nortriptyline (PAMELOR) 10 MG capsule START TAKING 1 CAPSULE AT BEDTIME. IF NO PROBLEMS AFTER 1 WEEK INCREASE TO 2 AT BETIME 60 capsule 5  . pantoprazole (PROTONIX) 40 MG tablet TAKE 1 TABLET (40 MG TOTAL) BY MOUTH 2 (TWO) TIMES DAILY. (Patient taking differently: 1 Tablet daily) 60 tablet 11  . polyethylene glycol (MIRALAX / GLYCOLAX) packet Take 17 g by mouth daily as needed.     No current facility-administered medications on file prior to visit.     Allergies  Allergen Reactions  . Prevpac [Amoxicill-Clarithro-Lansopraz] Other (See Comments)    Throat closed up  . Latex Rash  . Duloxetine Other (See Comments)    Nausea, diarrhea and jittery    Past Medical History:  Diagnosis Date  . Allergic rhinitis, cause unspecified   . Allergy   . Anemia   . Complication of anesthesia   . Diverticulosis   . DVT (deep venous thrombosis) ([place])   . Gastric ulcer    in past  . GERD (gastroesophageal reflux disease)   . Hemorrhoids, internal   . Hyperlipidemia   . Osteoarthrosis involving, or with mention of more than one site, but not specified as generalized, multiple sites   . PONV (postoperative nausea and vomiting)   .  Ulcer (traumatic) of oral mucosa   . Unspecified venous (peripheral) insufficiency     Past Surgical History:  Procedure Laterality Date  . ABDOMINAL HYSTERECTOMY  1996  . BREAST BIOPSY  2003  . CHOLECYSTECTOMY  2000  . COLONOSCOPY WITH PROPOFOL N/A 10/02/2015   Procedure: COLONOSCOPY WITH PROPOFOL;  Surgeon: Lollie Sails, MD;  Location: [facility] ENDOSCOPY;  Service: Endoscopy;  Laterality: N/A;  . DEXA  5/14   Normal  . KNEE ARTHROSCOPY     right in 2007, left in 2011  . TONSILLECTOMY AND ADENOIDECTOMY  childhood     Family History  Problem Relation Age of Onset  . Heart disease Mother   . Heart disease Father   . Diabetes Son 5       Type 1  . Hypertension Son   . Stroke Brother   . Diabetes Other 2       type 1  . Hyperlipidemia Neg Hx   . Depression Neg Hx     Social History   Social History  . Marital status: Married    Spouse name: N/A  . Number of children: 1  . Years of education: N/A   Occupational History  . Dental hygienist     retired   Social History Main Topics  . Smoking status: Former Smoker    Types: Cigarettes  . Smokeless tobacco: Never Used  . Alcohol use No     Comment: rare glass of wine  . Drug use: No  . Sexual activity: Not on file   Other Topics Concern  . Not on file   Social History Narrative   No living will   Asks for husband to make decisions-- but not sure he can after stroke. Son Joneen Caraway should now do this   Would accept resuscitation   Doesn't want tube feeds if cognitively unaware   Review of Systems Appetite is fine Weight is stable Sleeps well with the nortriptyline Wears seat belt in car Teeth are okay--- sees dentist (Dr Kenton Kingfisher) Bowels are slow--chronic. Uses meds prn. No blood No skin rash or suspicious lesions Mild knee pain since fall about a year ago    Objective:   Physical Exam  Constitutional: She is oriented to person, place, and time. No distress.  HENT:  Mouth/Throat: Oropharynx is clear and moist. No oropharyngeal exudate.  Neck: No thyromegaly present.  Cardiovascular: Normal rate, regular rhythm, normal heart sounds and intact distal pulses.  Exam reveals no gallop.   No murmur heard. Pulmonary/Chest: Effort normal and breath sounds normal. No respiratory distress. She has no wheezes. She has no rales.  Abdominal: Soft. There is no tenderness.  Musculoskeletal: She exhibits no tenderness.  Calves thick without pitting  Neurological: She is alert and oriented to person, place, and time.  President-- "Trump,  Obama, CLlinton---Bush" [id number] Recall 3/3  Skin: No rash noted. No erythema.  Psychiatric: She has a normal mood and affect. Her behavior is normal.          Assessment & Plan:

## 2017-06-03 NOTE — Addendum Note (Signed)
Addended by: Pilar Grammes on: 06/03/2017 11:34 AM   Modules accepted: Orders

## 2017-06-04 ENCOUNTER — Encounter: Payer: Self-pay | Admitting: Internal Medicine

## 2017-07-05 ENCOUNTER — Other Ambulatory Visit: Payer: Self-pay | Admitting: Internal Medicine

## 2017-08-03 ENCOUNTER — Encounter: Payer: Self-pay | Admitting: Internal Medicine

## 2017-08-20 ENCOUNTER — Encounter: Payer: Self-pay | Admitting: Internal Medicine

## 2017-09-03 ENCOUNTER — Ambulatory Visit (INDEPENDENT_AMBULATORY_CARE_PROVIDER_SITE_OTHER): Payer: PPO

## 2017-09-03 DIAGNOSIS — Z23 Encounter for immunization: Secondary | ICD-10-CM | POA: Diagnosis not present

## 2017-10-20 ENCOUNTER — Encounter: Payer: Self-pay | Admitting: Internal Medicine

## 2017-12-25 DIAGNOSIS — H52222 Regular astigmatism, left eye: Secondary | ICD-10-CM | POA: Diagnosis not present

## 2018-02-01 ENCOUNTER — Encounter: Payer: Self-pay | Admitting: Internal Medicine

## 2018-03-01 ENCOUNTER — Other Ambulatory Visit: Payer: Self-pay | Admitting: Internal Medicine

## 2018-03-01 NOTE — Telephone Encounter (Signed)
Last filled 09-10-16 #30 Last OV 06-03-17 Next OV 06-16-18

## 2018-03-17 ENCOUNTER — Other Ambulatory Visit: Payer: Self-pay | Admitting: Internal Medicine

## 2018-03-22 ENCOUNTER — Other Ambulatory Visit: Payer: Self-pay | Admitting: Nephrology

## 2018-04-10 ENCOUNTER — Other Ambulatory Visit: Payer: Self-pay | Admitting: Internal Medicine

## 2018-04-19 ENCOUNTER — Encounter: Payer: Self-pay | Admitting: Internal Medicine

## 2018-06-09 ENCOUNTER — Encounter: Payer: PPO | Admitting: Internal Medicine

## 2018-06-15 ENCOUNTER — Encounter: Payer: Self-pay | Admitting: Emergency Medicine

## 2018-06-15 ENCOUNTER — Emergency Department: Payer: PPO

## 2018-06-15 ENCOUNTER — Other Ambulatory Visit: Payer: Self-pay

## 2018-06-15 ENCOUNTER — Observation Stay
Admission: EM | Admit: 2018-06-15 | Discharge: 2018-06-17 | Disposition: A | Discharge status: Home / Self Care | Payer: PPO | Attending: Family Medicine | Admitting: Family Medicine

## 2018-06-15 ENCOUNTER — Encounter: Payer: Self-pay | Admitting: Internal Medicine

## 2018-06-15 DIAGNOSIS — K640 First degree hemorrhoids: Secondary | ICD-10-CM | POA: Diagnosis not present

## 2018-06-15 DIAGNOSIS — I739 Peripheral vascular disease, unspecified: Secondary | ICD-10-CM | POA: Insufficient documentation

## 2018-06-15 DIAGNOSIS — K3189 Other diseases of stomach and duodenum: Secondary | ICD-10-CM | POA: Diagnosis not present

## 2018-06-15 DIAGNOSIS — K219 Gastro-esophageal reflux disease without esophagitis: Secondary | ICD-10-CM | POA: Insufficient documentation

## 2018-06-15 DIAGNOSIS — K449 Diaphragmatic hernia without obstruction or gangrene: Secondary | ICD-10-CM | POA: Insufficient documentation

## 2018-06-15 DIAGNOSIS — Z79899 Other long term (current) drug therapy: Secondary | ICD-10-CM | POA: Diagnosis not present

## 2018-06-15 DIAGNOSIS — E785 Hyperlipidemia, unspecified: Secondary | ICD-10-CM | POA: Insufficient documentation

## 2018-06-15 DIAGNOSIS — K2950 Unspecified chronic gastritis without bleeding: Secondary | ICD-10-CM | POA: Insufficient documentation

## 2018-06-15 DIAGNOSIS — I872 Venous insufficiency (chronic) (peripheral): Secondary | ICD-10-CM | POA: Insufficient documentation

## 2018-06-15 DIAGNOSIS — Z87891 Personal history of nicotine dependence: Secondary | ICD-10-CM | POA: Insufficient documentation

## 2018-06-15 DIAGNOSIS — K5730 Diverticulosis of large intestine without perforation or abscess without bleeding: Secondary | ICD-10-CM | POA: Diagnosis not present

## 2018-06-15 DIAGNOSIS — D649 Anemia, unspecified: Secondary | ICD-10-CM | POA: Diagnosis not present

## 2018-06-15 DIAGNOSIS — K922 Gastrointestinal hemorrhage, unspecified: Secondary | ICD-10-CM | POA: Diagnosis not present

## 2018-06-15 DIAGNOSIS — R000 Tachycardia, unspecified: Secondary | ICD-10-CM | POA: Diagnosis not present

## 2018-06-15 DIAGNOSIS — K317 Polyp of stomach and duodenum: Secondary | ICD-10-CM | POA: Insufficient documentation

## 2018-06-15 DIAGNOSIS — D500 Iron deficiency anemia secondary to blood loss (chronic): Principal | ICD-10-CM | POA: Insufficient documentation

## 2018-06-15 DIAGNOSIS — Z86718 Personal history of other venous thrombosis and embolism: Secondary | ICD-10-CM | POA: Diagnosis not present

## 2018-06-15 DIAGNOSIS — F419 Anxiety disorder, unspecified: Secondary | ICD-10-CM | POA: Diagnosis present

## 2018-06-15 DIAGNOSIS — Z7982 Long term (current) use of aspirin: Secondary | ICD-10-CM | POA: Insufficient documentation

## 2018-06-15 DIAGNOSIS — R0602 Shortness of breath: Secondary | ICD-10-CM | POA: Diagnosis not present

## 2018-06-15 LAB — COMPREHENSIVE METABOLIC PANEL
ALK PHOS: 108 U/L (ref 38–126)
ALT: 15 U/L (ref 0–44)
AST: 18 U/L (ref 15–41)
Albumin: 3.6 g/dL (ref 3.5–5.0)
Anion gap: 8 (ref 5–15)
BILIRUBIN TOTAL: 0.5 mg/dL (ref 0.3–1.2)
BUN: 27 mg/dL — AB (ref 8–23)
CALCIUM: 9.1 mg/dL (ref 8.9–10.3)
CO2: 22 mmol/L (ref 22–32)
Chloride: 109 mmol/L (ref 98–111)
Creatinine, Ser: 0.89 mg/dL (ref 0.44–1.00)
GFR calc Af Amer: >60 mL/min (ref >60)
GFR calc non Af Amer: >60 mL/min (ref >60)
GLUCOSE: 108 mg/dL — AB (ref 70–99)
Potassium: 4.6 mmol/L (ref 3.5–5.1)
SODIUM: 139 mmol/L (ref 135–145)
TOTAL PROTEIN: 7.1 g/dL (ref 6.5–8.1)

## 2018-06-15 LAB — CBC
HEMATOCRIT: 27.6 % — AB (ref 35.0–47.0)
HEMOGLOBIN: 9 g/dL — AB (ref 12.0–16.0)
MCH: 25.5 pg — ABNORMAL LOW (ref 26.0–34.0)
MCHC: 32.4 g/dL (ref 32.0–36.0)
MCV: 78.5 fL — ABNORMAL LOW (ref 80.0–100.0)
Platelets: 401 10*3/uL (ref 150–440)
RBC: 3.52 MIL/uL — ABNORMAL LOW (ref 3.80–5.20)
RDW: 14.9 % — ABNORMAL HIGH (ref 11.5–14.5)
WBC: 10.3 10*3/uL (ref 3.6–11.0)

## 2018-06-15 LAB — ABO/RH: ABO/RH(D): A POS

## 2018-06-15 LAB — TROPONIN I: Troponin I: <0.03 ng/mL (ref <0.03)

## 2018-06-15 LAB — PREPARE RBC (CROSSMATCH)

## 2018-06-15 MED ORDER — SODIUM CHLORIDE 0.9% IV SOLUTION
Freq: Once | INTRAVENOUS | Status: AC
  Administered 2018-06-15: 22:00:00 via INTRAVENOUS

## 2018-06-15 MED ORDER — DIPHENHYDRAMINE HCL 25 MG PO CAPS
25.0000 mg | ORAL_CAPSULE | Freq: Once | ORAL | Status: AC
  Administered 2018-06-15: 25 mg via ORAL
  Filled 2018-06-15: qty 1

## 2018-06-15 MED ORDER — POLYETHYLENE GLYCOL 3350 17 G PO PACK: 17.0000 g | PACK | Freq: Every day | ORAL | Status: DC | PRN

## 2018-06-15 MED ORDER — ALBUTEROL SULFATE (2.5 MG/3ML) 0.083% IN NEBU: 3.0000 mL | INHALATION_SOLUTION | Freq: Four times a day (QID) | RESPIRATORY_TRACT | Status: DC | PRN

## 2018-06-15 MED ORDER — ASPIRIN EC 81 MG PO TBEC
81.0000 mg | DELAYED_RELEASE_TABLET | Freq: Every day | ORAL | Status: DC
  Administered 2018-06-15: 81 mg via ORAL
  Filled 2018-06-15 (×2): qty 1

## 2018-06-15 MED ORDER — PANTOPRAZOLE SODIUM 40 MG PO TBEC
40.0000 mg | DELAYED_RELEASE_TABLET | Freq: Two times a day (BID) | ORAL | Status: DC
  Administered 2018-06-15 – 2018-06-17 (×4): 40 mg via ORAL
  Filled 2018-06-15 (×4): qty 1

## 2018-06-15 MED ORDER — ONDANSETRON HCL 4 MG PO TABS: 4.0000 mg | ORAL_TABLET | Freq: Four times a day (QID) | ORAL | Status: DC | PRN

## 2018-06-15 MED ORDER — LORAZEPAM 0.5 MG PO TABS: 0.5000 mg | ORAL_TABLET | Freq: Every evening | ORAL | Status: DC | PRN

## 2018-06-15 MED ORDER — ACETAMINOPHEN 325 MG PO TABS
650.0000 mg | ORAL_TABLET | Freq: Once | ORAL | Status: AC
  Administered 2018-06-15: 650 mg via ORAL
  Filled 2018-06-15: qty 2

## 2018-06-15 MED ORDER — ACETAMINOPHEN 325 MG PO TABS: 650.0000 mg | ORAL_TABLET | Freq: Four times a day (QID) | ORAL | Status: DC | PRN

## 2018-06-15 MED ORDER — NORTRIPTYLINE HCL 10 MG PO CAPS
10.0000 mg | ORAL_CAPSULE | Freq: Every day | ORAL | Status: DC
  Administered 2018-06-15: 10 mg via ORAL
  Filled 2018-06-15 (×3): qty 1

## 2018-06-15 MED ORDER — ACETAMINOPHEN 650 MG RE SUPP: 650.0000 mg | Freq: Four times a day (QID) | RECTAL | Status: DC | PRN

## 2018-06-15 MED ORDER — ONDANSETRON HCL 4 MG/2ML IJ SOLN
4.0000 mg | Freq: Four times a day (QID) | INTRAMUSCULAR | Status: DC | PRN
  Administered 2018-06-16: 4 mg via INTRAVENOUS
  Filled 2018-06-15: qty 2

## 2018-06-15 NOTE — ED Provider Notes (Addendum)
[facility] Emergency Department Provider Note ____________________________________________   First MD Initiated Contact with Patient 06/15/18 1916     (approximate)  I have reviewed the triage vital signs and the nursing notes.   HISTORY  Chief Complaint Shortness of Breath   HPI Alice Donaldson is a 72 y.o. female with a history of iron deficiency anemia requiring infusions as well as DVT on baby aspirin now off warfarin who was presented to the emergency department with several months of worsening shortness of breath with exertion.  However, over the past week she has had a dramatic increase in her symptoms and can only take a few steps without feeling her heart pounding, short of breath as well as diaphoresis.  She denies any chest pain.  Denies any blood in her stool.  Says that she has had a previous work-up with colonoscopy for possible GI bleeding which was negative.  Past Medical History:  Diagnosis Date  . Allergic rhinitis, cause unspecified   . Allergy   . Anemia   . Complication of anesthesia   . Diverticulosis   . DVT (deep venous thrombosis) ([place])   . Gastric ulcer    in past  . GERD (gastroesophageal reflux disease)   . Hemorrhoids, internal   . Hyperlipidemia   . Osteoarthrosis involving, or with mention of more than one site, but not specified as generalized, multiple sites   . PONV (postoperative nausea and vomiting)   . Ulcer (traumatic) of oral mucosa   . Unspecified venous (peripheral) insufficiency     Patient Active Problem List   Diagnosis Date Noted  . BMI 40.0-44.9, adult ([place]) 06/03/2017  . Iron deficiency anemia 08/14/2016  . Advance directive discussed with patient 05/06/2016  . Episodic mood disorder ([place]) 04/18/2015  . Routine general medical examination at a health care facility 04/12/2013  . GERD (gastroesophageal reflux disease)   . Allergic rhinitis, cause unspecified   . Spinal stenosis, lumbar region, with  neurogenic claudication   . Hyperlipidemia   . Chronic venous insufficiency     Past Surgical History:  Procedure Laterality Date  . ABDOMINAL HYSTERECTOMY  1996  . BREAST BIOPSY  2003  . CHOLECYSTECTOMY  2000  . COLONOSCOPY WITH PROPOFOL N/A 10/02/2015   Procedure: COLONOSCOPY WITH PROPOFOL;  Surgeon: Lollie Sails, MD;  Location: [facility] ENDOSCOPY;  Service: Endoscopy;  Laterality: N/A;  . DEXA  5/14   Normal  . KNEE ARTHROSCOPY     right in 2007, left in 2011  . TONSILLECTOMY AND ADENOIDECTOMY  childhood    Prior to Admission medications   Medication Sig Start Date End Date Taking? Authorizing Provider  albuterol (PROAIR HFA) 108 (90 Base) MCG/ACT inhaler INHALE 2 PUFFS INTO THE LUNGS EVERY 6 (SIX) HOURS AS NEEDED FOR WHEEZING. 05/06/16   [name], MD  aspirin 81 MG tablet Take 81 mg by mouth daily. Reported on 03/03/2016    [provider]  cetirizine (ZYRTEC) 10 MG tablet Take 10 mg by mouth daily.    [provider]  chlorzoxazone (PARAFON) 500 MG tablet Take 1 tablet (500 mg total) by mouth 3 (three) times daily as needed. 06/03/17   [name], MD  Cholecalciferol (VITAMIN D-1000 MAX ST) 1000 units tablet Take 1,000 Units by mouth daily.  09/04/10   [provider]  docusate sodium (STOOL SOFTENER) 100 MG capsule Take 100 mg by mouth 2 (two) times daily.  09/04/10   [provider]  etodolac (LODINE)  500 MG tablet TAKE 1 TABLET (500 MG TOTAL) BY MOUTH 2 (TWO) TIMES DAILY. 03/01/18   [name], MD  Glucos-Chondroit-Hyaluron-MSM (GLUCOSAMINE CHONDROITIN JOINT) TABS Take 2 tablets by mouth daily.    [provider]  LORazepam (ATIVAN) 0.5 MG tablet TAKE 1/2 TO 1 TABLET BY MOUTH AT BEDTIME 03/01/18   Viviana Simpler I, MD  nortriptyline (PAMELOR) 10 MG capsule TAKE 2 CAPSULES BY MOUTH AT BEDTIME 04/12/18   Viviana Simpler I, MD  pantoprazole (PROTONIX) 40 MG tablet TAKE 1 TABLET (40 MG TOTAL) BY MOUTH 2 (TWO) TIMES  DAILY. 03/17/18   [name], MD  polyethylene glycol (MIRALAX / Floria Raveling) packet Take 17 g by mouth daily as needed.    [provider]    Allergies Prevpac [amoxicill-clarithro-lansopraz]; Latex; and Duloxetine  Family History  Problem Relation Age of Onset  . Heart disease Mother   . Heart disease Father   . Diabetes Son 5       Type 1  . Hypertension Son   . Stroke Brother   . Diabetes Other 2       type 1  . Hyperlipidemia Neg Hx   . Depression Neg Hx     Social History Social History   Tobacco Use  . Smoking status: Former Smoker    Types: Cigarettes  . Smokeless tobacco: Never Used  Substance Use Topics  . Alcohol use: No    Comment: rare glass of wine  . Drug use: No    Review of Systems  Constitutional: No fever/chills Eyes: No visual changes. ENT: No sore throat. Cardiovascular: Denies chest pain. Respiratory: As above Gastrointestinal: No abdominal pain.  No nausea, no vomiting.  No diarrhea.  No constipation. Genitourinary: Negative for dysuria. Musculoskeletal: Negative for back pain. Skin: Negative for rash. Neurological: Negative for headaches, focal weakness or numbness.   ____________________________________________   PHYSICAL EXAM:  VITAL SIGNS: ED Triage Vitals  Enc Vitals Group     BP 06/15/18 1835 (!) 161/107     Pulse Rate 06/15/18 1835 (!) 116     Resp 06/15/18 1835 (!) 24     Temp 06/15/18 1835 98 F (36.7 C)     Temp Source 06/15/18 1835 Oral     SpO2 06/15/18 1835 100 %     Weight 06/15/18 1836 250 lb (113.4 kg)     Height 06/15/18 1836 5\' 8"  (1.727 m)     Head Circumference --      Peak Flow --      Pain Score 06/15/18 1841 0     Pain Loc --      Pain Edu? --      Excl. in [place]? --     Constitutional: Alert and oriented. Well appearing and in no acute distress. Eyes: Conjunctivae are normal.  Head: Atraumatic. Nose: No congestion/rhinnorhea. Mouth/Throat: Mucous membranes are moist.  Neck: No stridor.    Cardiovascular: Tachycardic, regular rhythm. Grossly normal heart sounds.  Respiratory: Normal respiratory effort.  No retractions. Lungs CTAB. Gastrointestinal: Soft and nontender. No distention. No CVA tenderness. Grossly brown stool but heme positive on Hemoccult card. Musculoskeletal: No lower extremity tenderness nor edema.  No joint effusions. Neurologic:  Normal speech and language. No gross focal neurologic deficits are appreciated. Skin:  Skin is warm, dry and intact. No rash noted. Psychiatric: Mood and affect are normal. Speech and behavior are normal.  ____________________________________________   LABS (all labs ordered are listed, but only abnormal results are displayed)  Labs  Reviewed  CBC - Abnormal; Notable for the following components:      Result Value   RBC 3.52 (*)    Hemoglobin 9.0 (*)    HCT 27.6 (*)    MCV 78.5 (*)    MCH 25.5 (*)    RDW 14.9 (*)    All other components within normal limits  COMPREHENSIVE METABOLIC PANEL - Abnormal; Notable for the following components:   Glucose, Bld 108 (*)    BUN 27 (*)    All other components within normal limits  TROPONIN I  TYPE AND SCREEN   ____________________________________________  EKG  ED ECG REPORT I, Doran Stabler, the attending physician, personally viewed and interpreted this ECG.   Date: 06/15/2018  EKG Time: 1835  Rate: 112  Rhythm: sinus tachycardia  Axis: Normal  Intervals:none  ST&T Change: No ST segment elevation or depression.  No abnormal T wave inversion.  ____________________________________________  RADIOLOGY  Chest x-ray without acute process. ____________________________________________   PROCEDURES  Procedure(s) performed:   .Critical Care Performed by: Orbie Pyo, MD Authorized by: Orbie Pyo, MD   Critical care provider statement:    Critical care time (minutes):  35   Critical care time was exclusive of:  Separately billable  procedures and treating other patients   Critical care was necessary to treat or prevent imminent or life-threatening deterioration of the following conditions:  Circulatory failure   Critical care was time spent personally by me on the following activities:  Development of treatment plan with patient or surrogate, discussions with consultants, evaluation of patient's response to treatment, examination of patient, obtaining history from patient or surrogate, ordering and performing treatments and interventions, ordering and review of laboratory studies, ordering and review of radiographic studies, pulse oximetry, re-evaluation of patient's condition and review of old charts    Critical Care performed:    ____________________________________________   INITIAL IMPRESSION / [place] / ED COURSE  Pertinent labs & imaging results that were available during my care of the patient were reviewed by me and considered in my medical decision making (see chart for details).  DDX: Symptomatic anemia, CHF, PE, pneumonia, atelectasis, MI, GI bleed As part of my medical decision making, I reviewed the following data within the [place] Notes from prior outpatient visits ----------------------------------------- 7:49 PM on 06/15/2018 -----------------------------------------  Patient with labs consistent with microcytic anemia.  However is also heme positive.  Patient will be admitted to the hospital.  She is aware of the diagnosis as well as the treatment plan.  Signed out to Dr. Tressia Miners. ____________________________________________   FINAL CLINICAL IMPRESSION(S) / ED DIAGNOSES  Symptomatic anemia.  GI bleeding.    NEW MEDICATIONS STARTED DURING THIS VISIT:  New Prescriptions   No medications on file     Note:  This document was prepared using Dragon voice recognition software and may include unintentional dictation errors.     Orbie Pyo,  MD 06/15/18 1949    Orbie Pyo, MD 06/24/18 2116

## 2018-06-15 NOTE — ED Notes (Signed)
FIRST NURSE NOTE: Pt to lobby with reports of shortness of breath and unable to catch breath.  Pt states once she is sitting down her shortness of breath gets better.  Pt hyperventilating as well.  Pt states she took an Ativan prior to arrival.

## 2018-06-15 NOTE — ED Notes (Signed)
Cole RN, aware of bed assigned  

## 2018-06-15 NOTE — ED Triage Notes (Signed)
Pt to ED from home c/o SOB x1.5 weeks, much worse with exertion walking from kitchen table to kitchen sink.  Denies pain, denies cough, denies hx of CHF.

## 2018-06-15 NOTE — H&P (Signed)
[facility] NAME: Alice Donaldson    MR#:  [id number]  DATE OF BIRTH:  12-Apr-1946  DATE OF ADMISSION:  06/15/2018  PRIMARY CARE PHYSICIAN: [name], MD   REQUESTING/REFERRING PHYSICIAN: Clearnce Hasten, MD  CHIEF COMPLAINT:   Chief Complaint  Patient presents with  . Shortness of Breath    HISTORY OF PRESENT ILLNESS:  Alice Donaldson  is a 72 y.o. female who presents with shortness of breath, fatigue.  Patient states that this is been going on since April, but got acutely worse over the last 3 to 7 days.  She is gotten significantly dyspneic on exertion over that period of time, even just with normal daily activities.  This is happened to her in the past about 2 years ago, at that time she was found to have become profoundly anemic.  She had EGD work-up which did not elucidate cause for her hemoglobin drop, though it was performed sometime after her initial symptoms started at that time.  She has been receiving IV infusions since then to maintain her hemoglobin.  Tonight her hemoglobin is back down to 9.  Hospitalist were called for admission for symptomatic anemia  PAST MEDICAL HISTORY:   Past Medical History:  Diagnosis Date  . Allergic rhinitis, cause unspecified   . Allergy   . Anemia   . Complication of anesthesia   . Diverticulosis   . DVT (deep venous thrombosis) ([place])   . Gastric ulcer    in past  . GERD (gastroesophageal reflux disease)   . Hemorrhoids, internal   . Hyperlipidemia   . Osteoarthrosis involving, or with mention of more than one site, but not specified as generalized, multiple sites   . PONV (postoperative nausea and vomiting)   . Ulcer (traumatic) of oral mucosa   . Unspecified venous (peripheral) insufficiency      PAST SURGICAL HISTORY:   Past Surgical History:  Procedure Laterality Date  . ABDOMINAL HYSTERECTOMY  1996  . BREAST BIOPSY  2003  . CHOLECYSTECTOMY  2000  . COLONOSCOPY WITH  PROPOFOL N/A 10/02/2015   Procedure: COLONOSCOPY WITH PROPOFOL;  Surgeon: Lollie Sails, MD;  Location[facility] ENDOSCOPY;  Service: Endoscopy;  Laterality: N/A;  . DEXA  5/14   Normal  . KNEE ARTHROSCOPY     right in 2007, left in 2011  . TONSILLECTOMY AND ADENOIDECTOMY  childhood     SOCIAL HISTORY:   Social History   Tobacco Use  . Smoking status: Former Smoker    Types: Cigarettes  . Smokeless tobacco: Never Used  Substance Use Topics  . Alcohol use: No    Comment: rare glass of wine     FAMILY HISTORY:   Family History  Problem Relation Age of Onset  . Heart disease Mother   . Heart disease Father   . Diabetes Son 5       Type 1  . Hypertension Son   . Stroke Brother   . Diabetes Other 2       type 1  . Hyperlipidemia Neg Hx   . Depression Neg Hx      DRUG ALLERGIES:   Allergies  Allergen Reactions  . Prevpac [Amoxicill-Clarithro-Lansopraz] Other (See Comments)    Throat closed up  . Latex Rash  . Duloxetine Other (See Comments)    Nausea, diarrhea and jittery    MEDICATIONS AT HOME:   Prior to Admission medications   Medication Sig Start Date End Date  Taking? Authorizing Provider  albuterol (PROAIR HFA) 108 (90 Base) MCG/ACT inhaler INHALE 2 PUFFS INTO THE LUNGS EVERY 6 (SIX) HOURS AS NEEDED FOR WHEEZING. Patient taking differently: Inhale 2 puffs into the lungs every 6 (six) hours as needed for wheezing.  05/06/16  Yes [name], MD  aspirin EC 81 MG tablet Take 81 mg by mouth daily.   Yes [provider]  cetirizine (ZYRTEC) 10 MG tablet Take 10 mg by mouth at bedtime.    Yes [provider]  chlorzoxazone (PARAFON) 500 MG tablet Take 1 tablet (500 mg total) by mouth 3 (three) times daily as needed. Patient taking differently: Take 500 mg by mouth 3 (three) times daily as needed for muscle spasms.  06/03/17  Yes [name], MD  cholecalciferol (VITAMIN D) 1000 units tablet Take 1,000 Units by mouth daily.   Yes  [provider]  docusate sodium (STOOL SOFTENER) 100 MG capsule Take 100 mg by mouth at bedtime.    Yes [provider]  etodolac (LODINE) 500 MG tablet TAKE 1 TABLET (500 MG TOTAL) BY MOUTH 2 (TWO) TIMES DAILY. 03/01/18  Yes [name], MD  Glucos-Chondroit-Hyaluron-MSM (GLUCOSAMINE CHONDROITIN JOINT) TABS Take 2 tablets by mouth daily.   Yes [provider]  LORazepam (ATIVAN) 0.5 MG tablet TAKE 1/2 TO 1 TABLET BY MOUTH AT BEDTIME Patient taking differently: Take 0.5-1 mg by mouth at bedtime as needed for anxiety. TAKE 1/2 TO 1 TABLET BY MOUTH AT BEDTIME 03/01/18  Yes [name], MD  nortriptyline (PAMELOR) 10 MG capsule TAKE 2 CAPSULES BY MOUTH AT BEDTIME Patient taking differently: TAKE 1 CAPSULE BY MOUTH AT BEDTIME 04/12/18  Yes Viviana Simpler I, MD  pantoprazole (PROTONIX) 40 MG tablet TAKE 1 TABLET (40 MG TOTAL) BY MOUTH 2 (TWO) TIMES DAILY. 03/17/18  Yes [name], MD  polyethylene glycol (MIRALAX / GLYCOLAX) packet Take 17 g by mouth daily as needed for moderate constipation.    Yes [provider]    REVIEW OF SYSTEMS:  Review of Systems  Constitutional: Positive for malaise/fatigue. Negative for chills, fever and weight loss.  HENT: Negative for ear pain, hearing loss and tinnitus.   Eyes: Negative for blurred vision, double vision, pain and redness.  Respiratory: Positive for shortness of breath. Negative for cough and hemoptysis.   Cardiovascular: Negative for chest pain, palpitations, orthopnea and leg swelling.  Gastrointestinal: Negative for abdominal pain, constipation, diarrhea, nausea and vomiting.  Genitourinary: Negative for dysuria, frequency and hematuria.  Musculoskeletal: Negative for back pain, joint pain and neck pain.  Skin:       No acne, rash, or lesions  Neurological: Negative for dizziness, tremors, focal weakness and weakness.  Endo/Heme/Allergies: Negative for polydipsia. Does not bruise/bleed easily.   Psychiatric/Behavioral: Negative for depression. The patient is not nervous/anxious and does not have insomnia.      VITAL SIGNS:   Vitals:   06/15/18 1835 06/15/18 1836  BP: (!) 161/107   Pulse: (!) 116   Resp: (!) 24   Temp: 98 F (36.7 C)   TempSrc: Oral   SpO2: 100%   Weight:  113.4 kg (250 lb)  Height:  5\' 8"  (1.727 m)   Wt Readings from Last 3 Encounters:  06/15/18 113.4 kg (250 lb)  06/03/17 112.9 kg (249 lb)  01/20/17 113 kg (249 lb 3.7 oz)    PHYSICAL EXAMINATION:  Physical Exam  Vitals reviewed. Constitutional: She is oriented to person, place, and time. She appears  well-developed and well-nourished. No distress.  HENT:  Head: Normocephalic and atraumatic.  Mouth/Throat: Oropharynx is clear and moist.  Eyes: Pupils are equal, round, and reactive to light. EOM are normal. No scleral icterus.  Conjunctival pallor  Neck: Normal range of motion. Neck supple. No JVD present. No thyromegaly present.  Cardiovascular: Normal rate, regular rhythm and intact distal pulses. Exam reveals no gallop and no friction rub.  No murmur heard. Respiratory: Effort normal and breath sounds normal. No respiratory distress. She has no wheezes. She has no rales.  GI: Soft. Bowel sounds are normal. She exhibits no distension. There is no tenderness.  Musculoskeletal: Normal range of motion. She exhibits no edema.  No arthritis, no gout  Lymphadenopathy:    She has no cervical adenopathy.  Neurological: She is alert and oriented to person, place, and time. No cranial nerve deficit.  No dysarthria, no aphasia  Skin: Skin is warm and dry. No rash noted. No erythema.  Psychiatric: She has a normal mood and affect. Her behavior is normal. Judgment and thought content normal.    LABORATORY PANEL:   CBC Recent Labs  Lab 06/15/18 1844  WBC 10.3  HGB 9.0*  HCT 27.6*  PLT 401    ------------------------------------------------------------------------------------------------------------------  Chemistries  Recent Labs  Lab 06/15/18 1844  NA 139  K 4.6  CL 109  CO2 22  GLUCOSE 108*  BUN 27*  CREATININE 0.89  CALCIUM 9.1  AST 18  ALT 15  ALKPHOS 108  BILITOT 0.5   ------------------------------------------------------------------------------------------------------------------  Cardiac Enzymes Recent Labs  Lab 06/15/18 1844  TROPONINI <0.03   ------------------------------------------------------------------------------------------------------------------  RADIOLOGY:  Dg Chest 2 View  Result Date: 06/15/2018 CLINICAL DATA:  Shortness of breath for 10 days EXAM: CHEST - 2 VIEW COMPARISON:  February 08, 2016 FINDINGS: There is no edema or consolidation. The heart size and pulmonary vascularity are normal. No adenopathy. There is a moderate hiatal hernia. There is upper lumbar levoscoliosis. IMPRESSION: No edema or consolidation.  Moderate hiatal hernia present. Electronically Signed   By: Lowella Grip III M.D.   On: 06/15/2018 19:05    EKG:   Orders placed or performed during the hospital encounter of 06/15/18  . EKG 12-Lead  . EKG 12-Lead  . ED EKG  . ED EKG    IMPRESSION AND PLAN:  Principal Problem:   Symptomatic anemia -we will transfuse 1 unit PRBC for symptom relief, will get a GI consult Active Problems:   Chronic venous insufficiency -continue home meds   Anxiety -home dose anxiolytic   GERD (gastroesophageal reflux disease) -home dose PPI   Hyperlipidemia -Home dose antilipid  Chart review performed and case discussed with ED provider. Labs, imaging and/or ECG reviewed by provider and discussed with patient/family. Management plans discussed with the patient and/or family.  DVT PROPHYLAXIS: Mechanical only  GI PROPHYLAXIS: PPI  ADMISSION STATUS: Observation  CODE STATUS: Full  TOTAL TIME TAKING CARE OF THIS  PATIENT: 40 minutes.   Dhairya Corales [place] 06/15/2018, 8:41 PM  [facility]  Office  [phone]  CC: Primary care physician; [name], MD  Note:  This document was prepared using Dragon voice recognition software and may include unintentional dictation errors.

## 2018-06-16 ENCOUNTER — Encounter: Payer: PPO | Admitting: Internal Medicine

## 2018-06-16 LAB — TYPE AND SCREEN
ABO/RH(D): A POS
Antibody Screen: NEGATIVE
UNIT DIVISION: 0

## 2018-06-16 LAB — CBC
HEMATOCRIT: 28.2 % — AB (ref 35.0–47.0)
HEMOGLOBIN: 9.3 g/dL — AB (ref 12.0–16.0)
MCH: 26.8 pg (ref 26.0–34.0)
MCHC: 33.1 g/dL (ref 32.0–36.0)
MCV: 81 fL (ref 80.0–100.0)
Platelets: 324 10*3/uL (ref 150–440)
RBC: 3.47 MIL/uL — AB (ref 3.80–5.20)
RDW: 15.5 % — ABNORMAL HIGH (ref 11.5–14.5)
WBC: 7.5 10*3/uL (ref 3.6–11.0)

## 2018-06-16 LAB — BASIC METABOLIC PANEL
ANION GAP: 5 (ref 5–15)
BUN: 22 mg/dL (ref 8–23)
CO2: 22 mmol/L (ref 22–32)
Calcium: 8.8 mg/dL — ABNORMAL LOW (ref 8.9–10.3)
Chloride: 113 mmol/L — ABNORMAL HIGH (ref 98–111)
Creatinine, Ser: 0.78 mg/dL (ref 0.44–1.00)
Glucose, Bld: 103 mg/dL — ABNORMAL HIGH (ref 70–99)
POTASSIUM: 4.2 mmol/L (ref 3.5–5.1)
Sodium: 140 mmol/L (ref 135–145)

## 2018-06-16 LAB — BPAM RBC
Blood Product Expiration Date: 201907222359
ISSUE DATE / TIME: 201907022210
Unit Type and Rh: 6200

## 2018-06-16 LAB — URINALYSIS, ROUTINE W REFLEX MICROSCOPIC
BILIRUBIN URINE: NEGATIVE
GLUCOSE, UA: NEGATIVE mg/dL
HGB URINE DIPSTICK: NEGATIVE
KETONES UR: NEGATIVE mg/dL
Leukocytes, UA: NEGATIVE
Nitrite: NEGATIVE
PH: 7 (ref 5.0–8.0)
PROTEIN: NEGATIVE mg/dL
Specific Gravity, Urine: 1.014 (ref 1.005–1.030)

## 2018-06-16 LAB — IRON AND TIBC
Iron: 29 ug/dL (ref 28–170)
SATURATION RATIOS: 7 % — AB (ref 10.4–31.8)
TIBC: 401 ug/dL (ref 250–450)
UIBC: 372 ug/dL

## 2018-06-16 LAB — FOLATE: FOLATE: 18 ng/mL (ref >5.9)

## 2018-06-16 LAB — VITAMIN B12: VITAMIN B 12: 271 pg/mL (ref 180–914)

## 2018-06-16 MED ORDER — SODIUM CHLORIDE 0.9 % IV SOLN
INTRAVENOUS | Status: DC
  Administered 2018-06-16 – 2018-06-17 (×3): via INTRAVENOUS

## 2018-06-16 MED ORDER — PEG 3350-KCL-NA BICARB-NACL 420 G PO SOLR
4000.0000 mL | Freq: Once | ORAL | Status: AC
  Administered 2018-06-16: 4000 mL via ORAL
  Filled 2018-06-16: qty 4000

## 2018-06-16 NOTE — Care Management Obs Status (Signed)
[place] NOTIFICATION   Patient Details  Name: Alice Donaldson MRN: [id number] Date of Birth: 11-11-1946   Medicare Observation Status Notification Given:  Yes    Beverly Sessions, RN 06/16/2018, 3:56 PM

## 2018-06-16 NOTE — Consult Note (Signed)
Jonathon Bellows , MD [address] [place], [place], [place], [address] [address], [place], [place], [address] Phone: [phone]  Fax: [phone]  Consultation  Referring Provider:  Dr Darvin Neighbours  Primary Care Physician:  [name], MD Primary Gastroenterologist:  [facility]          Reason for Consultation:     Anemia   Date of Admission:  06/15/2018 Date of Consultation:  06/16/2018         HPI:   Alice Donaldson is a 72 y.o. female presented to the ER on 06/15/18 with shortness of breath on exertion .   On checking her labs noted to have microcytic anemia and admitted.  She was seen by [facility] back in 05/2016 for iron deficiency anemia. Unclear why she didn't have a full work up including a capsule study of the small bowel .   Last colonoscopy was in 2016 and some polyps were excised per last GI note. EGD was in 2017 .1 year back had normal Hb at 14.6 grams and on admission was 9 grams with MCV 78 . 1 year back ferritin was 9 . She has received IV iron in the past.   She denies any overt blood loss in urine, per vagina, rectum , nose. Feels well,no other symptoms    Past Medical History:  Diagnosis Date  . Allergic rhinitis, cause unspecified   . Allergy   . Anemia   . Complication of anesthesia   . Diverticulosis   . DVT (deep venous thrombosis) ([place])   . Gastric ulcer    in past  . GERD (gastroesophageal reflux disease)   . Hemorrhoids, internal   . Hyperlipidemia   . Osteoarthrosis involving, or with mention of more than one site, but not specified as generalized, multiple sites   . PONV (postoperative nausea and vomiting)   . Ulcer (traumatic) of oral mucosa   . Unspecified venous (peripheral) insufficiency     Past Surgical History:  Procedure Laterality Date  . ABDOMINAL HYSTERECTOMY  1996  . BREAST BIOPSY  2003  . CHOLECYSTECTOMY  2000  . COLONOSCOPY WITH PROPOFOL N/A 10/02/2015   Procedure: COLONOSCOPY WITH PROPOFOL;  Surgeon: Lollie Sails, MD;  Location: [facility] ENDOSCOPY;  Service: Endoscopy;  Laterality: N/A;  . DEXA  5/14   Normal  . KNEE ARTHROSCOPY     right in 2007, left in 2011  . TONSILLECTOMY AND ADENOIDECTOMY  childhood    Prior to Admission medications   Medication Sig Start Date End Date Taking? Authorizing Provider  albuterol (PROAIR HFA) 108 (90 Base) MCG/ACT inhaler INHALE 2 PUFFS INTO THE LUNGS EVERY 6 (SIX) HOURS AS NEEDED FOR WHEEZING. Patient taking differently: Inhale 2 puffs into the lungs every 6 (six) hours as needed for wheezing.  05/06/16  Yes [name], MD  aspirin EC 81 MG tablet Take 81 mg by mouth daily.   Yes [provider]  cetirizine (ZYRTEC) 10 MG tablet Take 10 mg by mouth at bedtime.    Yes [provider]  chlorzoxazone (PARAFON) 500 MG tablet Take 1 tablet (500 mg total) by mouth 3 (three) times daily as needed. Patient taking differently: Take 500 mg by mouth 3 (three) times daily as needed for muscle spasms.  06/03/17  Yes [name], MD  cholecalciferol (VITAMIN D) 1000 units tablet Take 1,000 Units by mouth daily.   Yes [provider]  docusate sodium (STOOL SOFTENER) 100 MG capsule  Take 100 mg by mouth at bedtime.    Yes [provider]  etodolac (LODINE) 500 MG tablet TAKE 1 TABLET (500 MG TOTAL) BY MOUTH 2 (TWO) TIMES DAILY. 03/01/18  Yes [name], MD  Glucos-Chondroit-Hyaluron-MSM (GLUCOSAMINE CHONDROITIN JOINT) TABS Take 2 tablets by mouth daily.   Yes [provider]  LORazepam (ATIVAN) 0.5 MG tablet TAKE 1/2 TO 1 TABLET BY MOUTH AT BEDTIME Patient taking differently: Take 0.5-1 mg by mouth at bedtime as needed for anxiety. TAKE 1/2 TO 1 TABLET BY MOUTH AT BEDTIME 03/01/18  Yes [name], MD  nortriptyline (PAMELOR) 10 MG capsule TAKE 2 CAPSULES BY MOUTH AT BEDTIME Patient taking differently: TAKE 1 CAPSULE BY MOUTH AT BEDTIME 04/12/18  Yes Viviana Simpler I, MD  pantoprazole (PROTONIX) 40 MG tablet  TAKE 1 TABLET (40 MG TOTAL) BY MOUTH 2 (TWO) TIMES DAILY. 03/17/18  Yes [name], MD  polyethylene glycol (MIRALAX / GLYCOLAX) packet Take 17 g by mouth daily as needed for moderate constipation.    Yes [provider]    Family History  Problem Relation Age of Onset  . Heart disease Mother   . Heart disease Father   . Diabetes Son 5       Type 1  . Hypertension Son   . Stroke Brother   . Diabetes Other 2       type 1  . Hyperlipidemia Neg Hx   . Depression Neg Hx      Social History   Tobacco Use  . Smoking status: Former Smoker    Types: Cigarettes  . Smokeless tobacco: Never Used  Substance Use Topics  . Alcohol use: No    Comment: rare glass of wine  . Drug use: No    Allergies as of 06/15/2018 - Review Complete 06/15/2018  Allergen Reaction Noted  . Prevpac [amoxicill-clarithro-lansopraz] Other (See Comments) 03/03/2016  . Latex Rash 03/03/2016  . Duloxetine Other (See Comments) 11/03/2016    Review of Systems:    All systems reviewed and negative except where noted in HPI.   Physical Exam:  Vital signs in last 24 hours: Temp:  [97.4 F (36.3 C)-98.3 F (36.8 C)] 97.7 F (36.5 C) (07/03 0455) Pulse Rate:  [82-116] 111 (07/03 0501) Resp:  [16-24] 20 (07/03 0455) BP: (112-161)/(52-119) 133/68 (07/03 0455) SpO2:  [97 %-100 %] 99 % (07/03 0501) Weight:  [233 lb 7.5 oz (105.9 kg)-250 lb (113.4 kg)] 233 lb 7.5 oz (105.9 kg) (07/02 2200) Last BM Date: 06/15/18 General:   Pleasant, cooperative in NAD Head:  Normocephalic and atraumatic. Eyes:   No icterus.   Conjunctiva pink. PERRLA. Ears:  Normal auditory acuity. Neck:  Supple; no masses or thyroidomegaly Lungs: Respirations even and unlabored. Lungs clear to auscultation bilaterally.   No wheezes, crackles, or rhonchi.  Heart:  Regular rate and rhythm;  Without murmur, clicks, rubs or gallops Abdomen:  Soft, nondistended, nontender. Normal bowel sounds. No appreciable masses or hepatomegaly.   No rebound or guarding.  Neurologic:  Alert and oriented x3;  grossly normal neurologically. Skin:  Intact without significant lesions or rashes. Cervical Nodes:  No significant cervical adenopathy. Psych:  Alert and cooperative. Normal affect.  LAB RESULTS: Recent Labs    06/15/18 1844 06/16/18 0406  WBC 10.3 7.5  HGB 9.0* 9.3*  HCT 27.6* 28.2*  PLT 401 324   BMET Recent Labs    06/15/18 1844 06/16/18 0406  NA 139 140  K 4.6 4.2  CL  109 113*  CO2 22 22  GLUCOSE 108* 103*  BUN 27* 22  CREATININE 0.89 0.78  CALCIUM 9.1 8.8*   LFT Recent Labs    06/15/18 1844  PROT 7.1  ALBUMIN 3.6  AST 18  ALT 15  ALKPHOS 108  BILITOT 0.5   PT/INR No results for input(s): LABPROT, INR in the last 72 hours.  STUDIES: Dg Chest 2 View  Result Date: 06/15/2018 CLINICAL DATA:  Shortness of breath for 10 days EXAM: CHEST - 2 VIEW COMPARISON:  February 08, 2016 FINDINGS: There is no edema or consolidation. The heart size and pulmonary vascularity are normal. No adenopathy. There is a moderate hiatal hernia. There is upper lumbar levoscoliosis. IMPRESSION: No edema or consolidation.  Moderate hiatal hernia present. Electronically Signed   By: Lowella Grip III M.D.   On: 06/15/2018 19:05      Impression / Plan:   Yuvia C Kidd is a 72 y.o. y/o female with long standing iron deficiency anemia. Only partly evaluated by GI with a colonoscopy in 2016 and EGD in 2017 . Capsule study was never done for unclear reasons. , Stools tests done for unclear reasons( stool tests are only meant for colon cancer screening and have a poor positive predictive value for chronic GI bleeds particularly upper GI). She is here today admitted with symptomatic iron deficiency anemia. No overt bloos loss   Plan  1. Check iron studies,b12,folate- If low needs IV iron  2. Repeat EGD+ colonoscopy and if negative will need capsule study irrespective of any stool tests. Insurance will not authorize a capsule  study unless EGD and colonoscopy done within he last 1 year.   I have discussed alternative options, risks & benefits,  which include, but are not limited to, bleeding, infection, perforation,respiratory complication & drug reaction.  The patient agrees with this plan & written consent will be obtained.      Thank you for involving me in the care of this patient.      LOS: 0 days   Jonathon Bellows, MD  06/16/2018, 11:27 AM

## 2018-06-16 NOTE — H&P (View-Only) (Signed)
Jonathon Bellows , MD [address], [place], [place], [place], [address] [address] [place], [place], [place], [address] Phone: [phone]  Fax: [phone]  Consultation  Referring Provider:  Dr Darvin Neighbours  Primary Care Physician:  [name], MD Primary Gastroenterologist:  [facility]          Reason for Consultation:     Anemia   Date of Admission:  06/15/2018 Date of Consultation:  06/16/2018         HPI:   Alice Donaldson is a 72 y.o. female presented to the ER on 06/15/18 with shortness of breath on exertion .   On checking her labs noted to have microcytic anemia and admitted.  She was seen by [facility] back in 05/2016 for iron deficiency anemia. Unclear why she didn't have a full work up including a capsule study of the small bowel .   Last colonoscopy was in 2016 and some polyps were excised per last GI note. EGD was in 2017 .1 year back had normal Hb at 14.6 grams and on admission was 9 grams with MCV 78 . 1 year back ferritin was 9 . She has received IV iron in the past.   She denies any overt blood loss in urine, per vagina, rectum , nose. Feels well,no other symptoms    Past Medical History:  Diagnosis Date  . Allergic rhinitis, cause unspecified   . Allergy   . Anemia   . Complication of anesthesia   . Diverticulosis   . DVT (deep venous thrombosis) ([place])   . Gastric ulcer    in past  . GERD (gastroesophageal reflux disease)   . Hemorrhoids, internal   . Hyperlipidemia   . Osteoarthrosis involving, or with mention of more than one site, but not specified as generalized, multiple sites   . PONV (postoperative nausea and vomiting)   . Ulcer (traumatic) of oral mucosa   . Unspecified venous (peripheral) insufficiency     Past Surgical History:  Procedure Laterality Date  . ABDOMINAL HYSTERECTOMY  1996  . BREAST BIOPSY  2003  . CHOLECYSTECTOMY  2000  . COLONOSCOPY WITH PROPOFOL N/A 10/02/2015   Procedure: COLONOSCOPY WITH PROPOFOL;  Surgeon: Lollie Sails, MD;  Location: [facility] ENDOSCOPY;  Service: Endoscopy;  Laterality: N/A;  . DEXA  5/14   Normal  . KNEE ARTHROSCOPY     right in 2007, left in 2011  . TONSILLECTOMY AND ADENOIDECTOMY  childhood    Prior to Admission medications   Medication Sig Start Date End Date Taking? Authorizing Provider  albuterol (PROAIR HFA) 108 (90 Base) MCG/ACT inhaler INHALE 2 PUFFS INTO THE LUNGS EVERY 6 (SIX) HOURS AS NEEDED FOR WHEEZING. Patient taking differently: Inhale 2 puffs into the lungs every 6 (six) hours as needed for wheezing.  05/06/16  Yes [name], MD  aspirin EC 81 MG tablet Take 81 mg by mouth daily.   Yes [provider]  cetirizine (ZYRTEC) 10 MG tablet Take 10 mg by mouth at bedtime.    Yes [provider]  chlorzoxazone (PARAFON) 500 MG tablet Take 1 tablet (500 mg total) by mouth 3 (three) times daily as needed. Patient taking differently: Take 500 mg by mouth 3 (three) times daily as needed for muscle spasms.  06/03/17  Yes [name], MD  cholecalciferol (VITAMIN D) 1000 units tablet Take 1,000 Units by mouth daily.   Yes [provider]  docusate sodium (STOOL SOFTENER) 100 MG capsule  Take 100 mg by mouth at bedtime.    Yes [provider]  etodolac (LODINE) 500 MG tablet TAKE 1 TABLET (500 MG TOTAL) BY MOUTH 2 (TWO) TIMES DAILY. 03/01/18  Yes [name], MD  Glucos-Chondroit-Hyaluron-MSM (GLUCOSAMINE CHONDROITIN JOINT) TABS Take 2 tablets by mouth daily.   Yes [provider]  LORazepam (ATIVAN) 0.5 MG tablet TAKE 1/2 TO 1 TABLET BY MOUTH AT BEDTIME Patient taking differently: Take 0.5-1 mg by mouth at bedtime as needed for anxiety. TAKE 1/2 TO 1 TABLET BY MOUTH AT BEDTIME 03/01/18  Yes [name], MD  nortriptyline (PAMELOR) 10 MG capsule TAKE 2 CAPSULES BY MOUTH AT BEDTIME Patient taking differently: TAKE 1 CAPSULE BY MOUTH AT BEDTIME 04/12/18  Yes Viviana Simpler I, MD  pantoprazole (PROTONIX) 40 MG tablet  TAKE 1 TABLET (40 MG TOTAL) BY MOUTH 2 (TWO) TIMES DAILY. 03/17/18  Yes [name], MD  polyethylene glycol (MIRALAX / GLYCOLAX) packet Take 17 g by mouth daily as needed for moderate constipation.    Yes [provider]    Family History  Problem Relation Age of Onset  . Heart disease Mother   . Heart disease Father   . Diabetes Son 5       Type 1  . Hypertension Son   . Stroke Brother   . Diabetes Other 2       type 1  . Hyperlipidemia Neg Hx   . Depression Neg Hx      Social History   Tobacco Use  . Smoking status: Former Smoker    Types: Cigarettes  . Smokeless tobacco: Never Used  Substance Use Topics  . Alcohol use: No    Comment: rare glass of wine  . Drug use: No    Allergies as of 06/15/2018 - Review Complete 06/15/2018  Allergen Reaction Noted  . Prevpac [amoxicill-clarithro-lansopraz] Other (See Comments) 03/03/2016  . Latex Rash 03/03/2016  . Duloxetine Other (See Comments) 11/03/2016    Review of Systems:    All systems reviewed and negative except where noted in HPI.   Physical Exam:  Vital signs in last 24 hours: Temp:  [97.4 F (36.3 C)-98.3 F (36.8 C)] 97.7 F (36.5 C) (07/03 0455) Pulse Rate:  [82-116] 111 (07/03 0501) Resp:  [16-24] 20 (07/03 0455) BP: (112-161)/(52-119) 133/68 (07/03 0455) SpO2:  [97 %-100 %] 99 % (07/03 0501) Weight:  [233 lb 7.5 oz (105.9 kg)-250 lb (113.4 kg)] 233 lb 7.5 oz (105.9 kg) (07/02 2200) Last BM Date: 06/15/18 General:   Pleasant, cooperative in NAD Head:  Normocephalic and atraumatic. Eyes:   No icterus.   Conjunctiva pink. PERRLA. Ears:  Normal auditory acuity. Neck:  Supple; no masses or thyroidomegaly Lungs: Respirations even and unlabored. Lungs clear to auscultation bilaterally.   No wheezes, crackles, or rhonchi.  Heart:  Regular rate and rhythm;  Without murmur, clicks, rubs or gallops Abdomen:  Soft, nondistended, nontender. Normal bowel sounds. No appreciable masses or hepatomegaly.   No rebound or guarding.  Neurologic:  Alert and oriented x3;  grossly normal neurologically. Skin:  Intact without significant lesions or rashes. Cervical Nodes:  No significant cervical adenopathy. Psych:  Alert and cooperative. Normal affect.  LAB RESULTS: Recent Labs    06/15/18 1844 06/16/18 0406  WBC 10.3 7.5  HGB 9.0* 9.3*  HCT 27.6* 28.2*  PLT 401 324   BMET Recent Labs    06/15/18 1844 06/16/18 0406  NA 139 140  K 4.6 4.2  CL  109 113*  CO2 22 22  GLUCOSE 108* 103*  BUN 27* 22  CREATININE 0.89 0.78  CALCIUM 9.1 8.8*   LFT Recent Labs    06/15/18 1844  PROT 7.1  ALBUMIN 3.6  AST 18  ALT 15  ALKPHOS 108  BILITOT 0.5   PT/INR No results for input(s): LABPROT, INR in the last 72 hours.  STUDIES: Dg Chest 2 View  Result Date: 06/15/2018 CLINICAL DATA:  Shortness of breath for 10 days EXAM: CHEST - 2 VIEW COMPARISON:  February 08, 2016 FINDINGS: There is no edema or consolidation. The heart size and pulmonary vascularity are normal. No adenopathy. There is a moderate hiatal hernia. There is upper lumbar levoscoliosis. IMPRESSION: No edema or consolidation.  Moderate hiatal hernia present. Electronically Signed   By: Lowella Grip III M.D.   On: 06/15/2018 19:05      Impression / Plan:   Alice Donaldson is a 72 y.o. y/o female with long standing iron deficiency anemia. Only partly evaluated by GI with a colonoscopy in 2016 and EGD in 2017 . Capsule study was never done for unclear reasons. , Stools tests done for unclear reasons( stool tests are only meant for colon cancer screening and have a poor positive predictive value for chronic GI bleeds particularly upper GI). She is here today admitted with symptomatic iron deficiency anemia. No overt bloos loss   Plan  1. Check iron studies,b12,folate- If low needs IV iron  2. Repeat EGD+ colonoscopy and if negative will need capsule study irrespective of any stool tests. Insurance will not authorize a capsule  study unless EGD and colonoscopy done within he last 1 year.   I have discussed alternative options, risks & benefits,  which include, but are not limited to, bleeding, infection, perforation,respiratory complication & drug reaction.  The patient agrees with this plan & written consent will be obtained.      Thank you for involving me in the care of this patient.      LOS: 0 days   Jonathon Bellows, MD  06/16/2018, 11:27 AM

## 2018-06-16 NOTE — Telephone Encounter (Signed)
Pt wanted to let Dr. Silvio Pate know the hospital did a transfusion last night and may do another one today. possibly doing colonoscopy and Endoscopy

## 2018-06-16 NOTE — Progress Notes (Signed)
[place] [facility] [place] NAME: Anakaren Ranker    MR#:  [id number]  DATE OF BIRTH:  04/14/1946  SUBJECTIVE:  CHIEF COMPLAINT:   Chief Complaint  Patient presents with  . Shortness of Breath   No abd pain Feels better after transfusion  REVIEW OF SYSTEMS:    Review of Systems  Constitutional: Positive for malaise/fatigue. Negative for chills and fever.  HENT: Negative for sore throat.   Eyes: Negative for blurred vision, double vision and pain.  Respiratory: Negative for cough, hemoptysis, shortness of breath and wheezing.   Cardiovascular: Negative for chest pain, palpitations, orthopnea and leg swelling.  Gastrointestinal: Negative for abdominal pain, constipation, diarrhea, heartburn, nausea and vomiting.  Genitourinary: Negative for dysuria and hematuria.  Musculoskeletal: Negative for back pain and joint pain.  Skin: Negative for rash.  Neurological: Negative for sensory change, speech change, focal weakness and headaches.  Endo/Heme/Allergies: Does not bruise/bleed easily.  Psychiatric/Behavioral: Negative for depression. The patient is not nervous/anxious.     DRUG ALLERGIES:   Allergies  Allergen Reactions  . Prevpac [Amoxicill-Clarithro-Lansopraz] Other (See Comments)    Throat closed up  . Latex Rash  . Duloxetine Other (See Comments)    Nausea, diarrhea and jittery    VITALS:  Blood pressure 130/69, pulse 85, temperature 97.9 F (36.6 C), temperature source Oral, resp. rate 19, height 5\' 8"  (1.727 m), weight 105.9 kg (233 lb 7.5 oz), SpO2 100 %.  PHYSICAL EXAMINATION:   Physical Exam  GENERAL:  72 y.o.-year-old patient lying in the bed with no acute distress.  EYES: Pupils equal, round, reactive to light and accommodation. No scleral icterus. Extraocular muscles intact.  HEENT: Head atraumatic, normocephalic. Oropharynx and nasopharynx clear.  NECK:  Supple, no jugular venous distention. No thyroid enlargement, no  tenderness.  LUNGS: Normal breath sounds bilaterally, no wheezing, rales, rhonchi. No use of accessory muscles of respiration.  CARDIOVASCULAR: S1, S2 normal. No murmurs, rubs, or gallops.  ABDOMEN: Soft, nontender, nondistended. Bowel sounds present. No organomegaly or mass.  EXTREMITIES: No cyanosis, clubbing or edema b/l.    NEUROLOGIC: Cranial nerves II through XII are intact. No focal Motor or sensory deficits b/l.   PSYCHIATRIC: The patient is alert and oriented x 3.  SKIN: No obvious rash, lesion, or ulcer.   LABORATORY PANEL:   CBC Recent Labs  Lab 06/16/18 0406  WBC 7.5  HGB 9.3*  HCT 28.2*  PLT 324   ------------------------------------------------------------------------------------------------------------------ Chemistries  Recent Labs  Lab 06/15/18 1844 06/16/18 0406  NA 139 140  K 4.6 4.2  CL 109 113*  CO2 22 22  GLUCOSE 108* 103*  BUN 27* 22  CREATININE 0.89 0.78  CALCIUM 9.1 8.8*  AST 18  --   ALT 15  --   ALKPHOS 108  --   BILITOT 0.5  --    ------------------------------------------------------------------------------------------------------------------  Cardiac Enzymes Recent Labs  Lab 06/15/18 1844  TROPONINI <0.03   ------------------------------------------------------------------------------------------------------------------  RADIOLOGY:  Dg Chest 2 View  Result Date: 06/15/2018 CLINICAL DATA:  Shortness of breath for 10 days EXAM: CHEST - 2 VIEW COMPARISON:  February 08, 2016 FINDINGS: There is no edema or consolidation. The heart size and pulmonary vascularity are normal. No adenopathy. There is a moderate hiatal hernia. There is upper lumbar levoscoliosis. IMPRESSION: No edema or consolidation.  Moderate hiatal hernia present. Electronically Signed   By: Lowella Grip III M.D.   On: 06/15/2018 19:05     ASSESSMENT AND PLAN:   *  Symptomatic anemia due to chronic GI blood loss S/p 1 unit PRBC. Hb at 9.3 EGD/Colonoscopy to be  scheduled . Discussed with Dr. Vicente Males D/C ASA Repeat Hb in AM   * Chronic venous insufficiency -continue home meds    Anxiety -home dose anxiolytic    GERD (gastroesophageal reflux disease) -home dose PPI    Hyperlipidemia -Home dose antilipid  All the records are reviewed and case discussed with Care Management/Social Worker Management plans discussed with the patient, family and they are in agreement.  CODE STATUS: FULL CODE  DVT Prophylaxis: SCDs  TOTAL TIME TAKING CARE OF THIS PATIENT: 35 minutes.   POSSIBLE D/C IN 1-2 DAYS, DEPENDING ON CLINICAL CONDITION.  Leia Alf Kelsee Preslar M.D on 06/16/2018 at 1:36 PM  Between 7am to 6pm - Pager - [phone]  After 6pm go to [email or web address] - password [email or web address] [facility]  Office  [phone]  CC: Primary care physician; [name], MD  Note: This dictation was prepared with Dragon dictation along with smaller phrase technology. Any transcriptional errors that result from this process are unintentional.

## 2018-06-16 NOTE — Discharge Instructions (Signed)
Resume diet and activity as before ° ° °

## 2018-06-17 ENCOUNTER — Encounter: Payer: Self-pay | Admitting: Anesthesiology

## 2018-06-17 ENCOUNTER — Observation Stay: Payer: PPO | Admitting: Anesthesiology

## 2018-06-17 ENCOUNTER — Encounter
Admission: EM | Disposition: A | Discharge status: Home / Self Care | Payer: Self-pay | Source: Home / Self Care | Attending: Emergency Medicine

## 2018-06-17 DIAGNOSIS — K3189 Other diseases of stomach and duodenum: Secondary | ICD-10-CM | POA: Diagnosis not present

## 2018-06-17 DIAGNOSIS — K5790 Diverticulosis of intestine, part unspecified, without perforation or abscess without bleeding: Secondary | ICD-10-CM | POA: Diagnosis not present

## 2018-06-17 DIAGNOSIS — E785 Hyperlipidemia, unspecified: Secondary | ICD-10-CM | POA: Diagnosis not present

## 2018-06-17 DIAGNOSIS — F419 Anxiety disorder, unspecified: Secondary | ICD-10-CM | POA: Diagnosis not present

## 2018-06-17 DIAGNOSIS — J309 Allergic rhinitis, unspecified: Secondary | ICD-10-CM | POA: Diagnosis not present

## 2018-06-17 DIAGNOSIS — I872 Venous insufficiency (chronic) (peripheral): Secondary | ICD-10-CM | POA: Diagnosis not present

## 2018-06-17 DIAGNOSIS — K2970 Gastritis, unspecified, without bleeding: Secondary | ICD-10-CM | POA: Diagnosis not present

## 2018-06-17 DIAGNOSIS — D509 Iron deficiency anemia, unspecified: Secondary | ICD-10-CM | POA: Diagnosis not present

## 2018-06-17 DIAGNOSIS — K317 Polyp of stomach and duodenum: Secondary | ICD-10-CM | POA: Diagnosis not present

## 2018-06-17 DIAGNOSIS — K5730 Diverticulosis of large intestine without perforation or abscess without bleeding: Secondary | ICD-10-CM | POA: Diagnosis not present

## 2018-06-17 DIAGNOSIS — K2950 Unspecified chronic gastritis without bleeding: Secondary | ICD-10-CM | POA: Diagnosis not present

## 2018-06-17 DIAGNOSIS — D649 Anemia, unspecified: Secondary | ICD-10-CM | POA: Diagnosis not present

## 2018-06-17 DIAGNOSIS — K640 First degree hemorrhoids: Secondary | ICD-10-CM | POA: Diagnosis not present

## 2018-06-17 DIAGNOSIS — K219 Gastro-esophageal reflux disease without esophagitis: Secondary | ICD-10-CM | POA: Diagnosis not present

## 2018-06-17 DIAGNOSIS — K449 Diaphragmatic hernia without obstruction or gangrene: Secondary | ICD-10-CM | POA: Diagnosis not present

## 2018-06-17 DIAGNOSIS — K648 Other hemorrhoids: Secondary | ICD-10-CM | POA: Diagnosis not present

## 2018-06-17 HISTORY — PX: COLONOSCOPY WITH PROPOFOL: SHX5780

## 2018-06-17 HISTORY — PX: ESOPHAGOGASTRODUODENOSCOPY (EGD) WITH PROPOFOL: SHX5813

## 2018-06-17 LAB — HEMOGLOBIN: Hemoglobin: 10 g/dL — ABNORMAL LOW (ref 12.0–16.0)

## 2018-06-17 SURGERY — ESOPHAGOGASTRODUODENOSCOPY (EGD) WITH PROPOFOL
Anesthesia: General

## 2018-06-17 MED ORDER — PROPOFOL 500 MG/50ML IV EMUL
INTRAVENOUS | Status: DC | PRN
  Administered 2018-06-17: 200 ug/kg/min via INTRAVENOUS

## 2018-06-17 MED ORDER — PROPOFOL 500 MG/50ML IV EMUL
INTRAVENOUS | Status: AC
  Filled 2018-06-17: qty 50

## 2018-06-17 MED ORDER — GLYCOPYRROLATE 0.2 MG/ML IJ SOLN
INTRAMUSCULAR | Status: DC | PRN
  Administered 2018-06-17: 0.2 mg via INTRAVENOUS

## 2018-06-17 MED ORDER — LIDOCAINE 2% (20 MG/ML) 5 ML SYRINGE
INTRAMUSCULAR | Status: DC | PRN
  Administered 2018-06-17: 100 mg via INTRAVENOUS

## 2018-06-17 MED ORDER — PROPOFOL 10 MG/ML IV BOLUS
INTRAVENOUS | Status: DC | PRN
  Administered 2018-06-17: 30 mg via INTRAVENOUS
  Administered 2018-06-17: 70 mg via INTRAVENOUS

## 2018-06-17 MED ORDER — SODIUM CHLORIDE 0.9 % IV SOLN: INTRAVENOUS | Status: DC

## 2018-06-17 NOTE — Progress Notes (Signed)
Patient transported to endoscopy for procedure

## 2018-06-17 NOTE — Anesthesia Postprocedure Evaluation (Signed)
Anesthesia Post Note  Patient: Endora C Sliva  Procedure(s) Performed: ESOPHAGOGASTRODUODENOSCOPY (EGD) WITH PROPOFOL (N/A ) COLONOSCOPY WITH PROPOFOL (N/A )  Patient location during evaluation: Endoscopy Anesthesia Type: General Level of consciousness: awake and alert Pain management: pain level controlled Vital Signs Assessment: post-procedure vital signs reviewed and stable Respiratory status: spontaneous breathing, nonlabored ventilation, respiratory function stable and patient connected to nasal cannula oxygen Cardiovascular status: blood pressure returned to baseline and stable Postop Assessment: no apparent nausea or vomiting Anesthetic complications: no     Last Vitals:  Vitals:   06/17/18 1059 06/17/18 1149  BP: (!) 118/59 (!) 126/46  Pulse: 76 85  Resp: 18 17  Temp: 36.4 C 36.7 C  SpO2: 100% 97%    Last Pain:  Vitals:   06/17/18 1149  TempSrc: Oral  PainSc:                  Martha Clan

## 2018-06-17 NOTE — Op Note (Signed)
[facility] Gastroenterology Patient Name: Alice Donaldson Procedure Date: 06/17/2018 9:06 AM MRN: [id number] Account #: [id number] Date of Birth: 06-Jul-1946 Admit Type: Inpatient Age: 72 Room: [facility] ENDO ROOM 4 Gender: Female Note Status: Finalized Procedure:            Upper GI endoscopy Indications:          Suspected upper gastrointestinal bleeding in patient                        with unexplained iron deficiency anemia Providers:            Benay Pike. Cassidie Veiga MD, MD Medicines:            Propofol per Anesthesia Complications:        No immediate complications. Procedure:            Pre-Anesthesia Assessment:                       - The risks and benefits of the procedure and the                        sedation options and risks were discussed with the                        patient. All questions were answered and informed                        consent was obtained.                       - Patient identification and proposed procedure were                        verified prior to the procedure by the nurse. The                        procedure was verified in the procedure room.                       - ASA Grade Assessment: III - A patient with severe                        systemic disease.                       - After reviewing the risks and benefits, the patient                        was deemed in satisfactory condition to undergo the                        procedure.                       After obtaining informed consent, the endoscope was                        passed under direct vision. Throughout the procedure,                        the patient's blood pressure, pulse, and oxygen  saturations were monitored continuously. The Endoscope                        was introduced through the mouth, and advanced to the                        third part of duodenum. The upper GI endoscopy was                        accomplished without  difficulty. The patient tolerated                        the procedure well. Findings:      The examined esophagus was normal.      Localized moderate inflammation characterized by congestion (edema),       erosions and erythema was found in the gastric body. Biopsies were taken       with a cold forceps for Helicobacter pylori testing.      A large hiatal hernia was present.      Multiple 3 to 7 mm pedunculated and sessile polyps with no bleeding and       no stigmata of recent bleeding were found in the gastric body. Biopsies       were taken with a cold forceps for histology.      The examined duodenum was normal. Biopsies for histology were taken with       a cold forceps for evaluation of celiac disease.      The exam was otherwise without abnormality. Impression:           - Normal esophagus.                       - Chronic gastritis. Biopsied.                       - Large hiatal hernia.                       - Multiple gastric polyps. Biopsied.                       - Normal examined duodenum. Biopsied.                       - The examination was otherwise normal. Recommendation:       - Await pathology results.                       - Proceed with colonoscopy Procedure Code(s):    --- Professional ---                       [phone], Esophagogastroduodenoscopy, flexible, transoral;                        with biopsy, single or multiple Diagnosis Code(s):    --- Professional ---                       D50.9, Iron deficiency anemia, unspecified                       K31.7, Polyp of stomach and duodenum  K44.9, Diaphragmatic hernia without obstruction or                        gangrene                       K29.50, Unspecified chronic gastritis without bleeding CPT copyright 2017 American Medical Association. All rights reserved. The codes documented in this report are preliminary and upon coder review may  be revised to meet current compliance  requirements. Efrain Sella MD, MD 06/17/2018 9:42:58 AM This report has been signed electronically. Number of Addenda: 0 Note Initiated On: 06/17/2018 9:06 AM      [facility]

## 2018-06-17 NOTE — Op Note (Addendum)
[facility] Gastroenterology Patient Name: Alice Donaldson Procedure Date: 06/17/2018 9:06 AM MRN: [id number] Account #: [id number] Date of Birth: [birth year] Admit Type: Inpatient Age: 72 Room: [facility] ENDO ROOM 4 Gender: Female Note Status: Finalized Procedure:            Colonoscopy Indications:          Unexplained iron deficiency anemia Providers:            Benay Pike. Jomar Denz MD, MD Medicines:            Propofol per Anesthesia Complications:        No immediate complications. Procedure:            Pre-Anesthesia Assessment:                       - The risks and benefits of the procedure and the                        sedation options and risks were discussed with the                        patient. All questions were answered and informed                        consent was obtained.                       - Patient identification and proposed procedure were                        verified prior to the procedure by the nurse. The                        procedure was verified in the procedure room.                       - ASA Grade Assessment: III - A patient with severe                        systemic disease.                       - After reviewing the risks and benefits, the patient                        was deemed in satisfactory condition to undergo the                        procedure.                       After obtaining informed consent, the colonoscope was                        passed under direct vision. Throughout the procedure,                        the patient's blood pressure, pulse, and oxygen                        saturations were monitored continuously. The  Colonoscope was introduced through the anus and                        advanced to the the cecum, identified by appendiceal                        orifice and ileocecal valve. The colonoscopy was                        performed without difficulty. The patient tolerated  the                        procedure well. The quality of the bowel preparation                        was adequate. The ileocecal valve, appendiceal orifice,                        and rectum were photographed. Findings:      The perianal and digital rectal examinations were normal. Pertinent       negatives include normal sphincter tone and no palpable rectal lesions.      Multiple small and large-mouthed diverticula were found in the sigmoid       colon.      Non-bleeding internal hemorrhoids were found during retroflexion. The       hemorrhoids were Grade I (internal hemorrhoids that do not prolapse).      The exam was otherwise without abnormality. Impression:           - Diverticulosis in the sigmoid colon.                       - Non-bleeding internal hemorrhoids.                       - The examination was otherwise normal.                       - No specimens collected. Recommendation:       - Patient has a contact number available for                        emergencies. The signs and symptoms of potential                        delayed complications were discussed with the patient.                        Return to normal activities tomorrow. Written discharge                        instructions were provided to the patient.                       - Cardiac diet.                       - Continue present medications.                       - Return patient to hospital ward for possible  discharge same day.                       - To visualize the small bowel, perform video capsule                        endoscopy Schedule video capsule endoscopy as                        outpatient at [facility].                       - Return to my office in 1 month. Procedure Code(s):    --- Professional ---                       [phone], Colonoscopy, flexible; diagnostic, including                        collection of specimen(s) by brushing or washing, when                         performed (separate procedure) Diagnosis Code(s):    --- Professional ---                       K64.0, First degree hemorrhoids                       D50.9, Iron deficiency anemia, unspecified                       K57.30, Diverticulosis of large intestine without                        perforation or abscess without bleeding CPT copyright 2017 American Medical Association. All rights reserved. The codes documented in this report are preliminary and upon coder review may  be revised to meet current compliance requirements. Efrain Sella MD, MD 06/17/2018 10:03:02 AM This report has been signed electronically. Number of Addenda: 0 Note Initiated On: 06/17/2018 9:06 AM Scope Withdrawal Time: 0 hours 9 minutes 31 seconds  Total Procedure Duration: 0 hours 13 minutes 45 seconds       [facility], [place]

## 2018-06-17 NOTE — Anesthesia Post-op Follow-up Note (Signed)
Anesthesia QCDR form completed.        

## 2018-06-17 NOTE — Anesthesia Preprocedure Evaluation (Addendum)
Anesthesia Evaluation  Patient identified by MRN, date of birth, ID band Patient awake    Reviewed: Allergy & Precautions, H&P , NPO status , Patient's Chart, lab work & pertinent test results  History of Anesthesia Complications (+) PONV and history of anesthetic complications  Airway Mallampati: III  TM Distance: >3 FB Neck ROM: limited    Dental  (+) Poor Dentition, Caps   Pulmonary neg pulmonary ROS, neg shortness of breath, former smoker,           Cardiovascular Exercise Tolerance: Good (-) hypertension(-) angina+ Peripheral Vascular Disease  (-) Past MI and (-) DOE (-) dysrhythmias (-) Valvular Problems/Murmurs     Neuro/Psych PSYCHIATRIC DISORDERS Anxiety negative neurological ROS     GI/Hepatic Neg liver ROS, PUD, GERD  Controlled and Medicated,  Endo/Other  negative endocrine ROS  Renal/GU negative Renal ROS  negative genitourinary   Musculoskeletal  (+) Arthritis ,   Abdominal   Peds  Hematology  (+) Blood dyscrasia, anemia ,   Anesthesia Other Findings Past Medical History:   GERD (gastroesophageal reflux disease)                       Allergic rhinitis, cause unspecified                         Osteoarthrosis involving, or with mention of m*              Hyperlipidemia                                               DVT (deep venous thrombosis) (HCC)                           Unspecified venous (peripheral) insufficiency                Gastric ulcer                                                  Comment:in past   Complication of anesthesia                                   PONV (postoperative nausea and vomiting)                    Past Surgical History:   CHOLECYSTECTOMY                                  2000         BREAST BIOPSY                                    2003         TONSILLECTOMY AND ADENOIDECTOMY                  childhood    ABDOMINAL HYSTERECTOMY  1996          KNEE ARTHROSCOPY                                                Comment:right in 2007, left in 2011   DEXA                                             5/14           Comment:Normal  BMI    Body Mass Index   37.86 kg/m 2      Reproductive/Obstetrics negative OB ROS                            Anesthesia Physical  Anesthesia Plan  ASA: III  Anesthesia Plan: General   Post-op Pain Management:    Induction: Intravenous  PONV Risk Score and Plan: 4 or greater and Propofol infusion  Airway Management Planned: Nasal Cannula  Additional Equipment:   Intra-op Plan:   Post-operative Plan:   Informed Consent: I have reviewed the patients History and Physical, chart, labs and discussed the procedure including the risks, benefits and alternatives for the proposed anesthesia with the patient or authorized representative who has indicated his/her understanding and acceptance.   Dental Advisory Given  Plan Discussed with: Anesthesiologist, CRNA and Surgeon  Anesthesia Plan Comments:        Anesthesia Quick Evaluation

## 2018-06-17 NOTE — Interval H&P Note (Signed)
History and Physical Interval Note:  06/17/2018 8:48 AM  Alice Donaldson  has presented today for surgery, with the diagnosis of iron def anemia  The various methods of treatment have been discussed with the patient and family. After consideration of risks, benefits and other options for treatment, the patient has consented to  Procedure(s): ESOPHAGOGASTRODUODENOSCOPY (EGD) WITH PROPOFOL (N/A) COLONOSCOPY WITH PROPOFOL (N/A) as a surgical intervention .  The patient's history has been reviewed, patient examined, no change in status, stable for surgery.  I have reviewed the patient's chart and labs.  Questions were answered to the patient's satisfaction.     [place], [place]

## 2018-06-17 NOTE — Transfer of Care (Signed)
Immediate Anesthesia Transfer of Care Note  Patient: Alice Donaldson  Procedure(s) Performed: ESOPHAGOGASTRODUODENOSCOPY (EGD) WITH PROPOFOL (N/A ) COLONOSCOPY WITH PROPOFOL (N/A )  Patient Location: Endoscopy Unit  Anesthesia Type:General  Level of Consciousness: sedated  Airway & Oxygen Therapy: Patient connected to nasal cannula oxygen  Post-op Assessment: Post -op Vital signs reviewed and stable  Post vital signs: stable  Last Vitals:  Vitals Value Taken Time  BP 108/63 06/17/2018 10:05 AM  Temp 37.4 C 06/17/2018 10:05 AM  Pulse 94 06/17/2018 10:07 AM  Resp 19 06/17/2018 10:07 AM  SpO2 98 % 06/17/2018 10:07 AM  Vitals shown include unvalidated device data.  Last Pain:  Vitals:   06/17/18 1005  TempSrc: Tympanic  PainSc: Asleep         Complications: No apparent anesthesia complications

## 2018-06-17 NOTE — Discharge Summary (Signed)
[place] [facility] [place] NAME: Alice Donaldson    MR#:  [id number]  DATE OF BIRTH:  26-May-1946  DATE OF ADMISSION:  06/15/2018 ADMITTING PHYSICIAN: Lance Coon, MD  DATE OF DISCHARGE: No discharge date for patient encounter.  PRIMARY CARE PHYSICIAN: [name], MD    ADMISSION DIAGNOSIS:  Symptomatic anemia [D64.9] Gastrointestinal hemorrhage, unspecified gastrointestinal hemorrhage type [K92.2]  DISCHARGE DIAGNOSIS:  Principal Problem:   Symptomatic anemia Active Problems:   GERD (gastroesophageal reflux disease)   Hyperlipidemia   Chronic venous insufficiency   Anxiety   SECONDARY DIAGNOSIS:   Past Medical History:  Diagnosis Date  . Allergic rhinitis, cause unspecified   . Allergy   . Anemia   . Complication of anesthesia   . Diverticulosis   . DVT (deep venous thrombosis) ([place])   . Gastric ulcer    in past  . GERD (gastroesophageal reflux disease)   . Hemorrhoids, internal   . Hyperlipidemia   . Osteoarthrosis involving, or with mention of more than one site, but not specified as generalized, multiple sites   . PONV (postoperative nausea and vomiting)   . Ulcer (traumatic) of oral mucosa   . Unspecified venous (peripheral) insufficiency     HOSPITAL COURSE:   * Acute Symptomatic anemia due to chronic GI blood loss Resolved S/p 1 unit PRBC. Hb 10 at discharge  S/P EGD/Colonoscopy June 17, 2018 by gastroenterology noted for gastritis and gastric polyps status post biopsy/sigmoid diverticulosis/grade 1 hemorrhoids-deemed okay to be discharged to home per Dr. Alice Reichert with follow-up in 1 month status post discharge for continued care, aspirin restarted at discharge, all other NSAIDs were discontinued, treated with PPI twice daily   *Chronic venous insufficiency  Stable on current regiment  *Anxiety, chronic Stable on current regiment  *History of GERD Stable on PPI   DISCHARGE CONDITIONS:    stable  CONSULTS OBTAINED:  Treatment Team:  Jonathon Bellows, MD Efrain Sella, MD  DRUG ALLERGIES:   Allergies  Allergen Reactions  . Prevpac [Amoxicill-Clarithro-Lansopraz] Other (See Comments)    Throat closed up  . Latex Rash  . Duloxetine Other (See Comments)    Nausea, diarrhea and jittery    DISCHARGE MEDICATIONS:   Allergies as of 06/17/2018      Reactions   Prevpac [amoxicill-clarithro-lansopraz] Other (See Comments)   Throat closed up   Latex Rash   Duloxetine Other (See Comments)   Nausea, diarrhea and jittery      Medication List    STOP taking these medications   etodolac 500 MG tablet Commonly known as:  LODINE     TAKE these medications   albuterol 108 (90 Base) MCG/ACT inhaler Commonly known as:  PROAIR HFA INHALE 2 PUFFS INTO THE LUNGS EVERY 6 (SIX) HOURS AS NEEDED FOR WHEEZING. What changed:    how much to take  how to take this  when to take this  reasons to take this  additional instructions   aspirin EC 81 MG tablet Take 81 mg by mouth daily.   cetirizine 10 MG tablet Commonly known as:  ZYRTEC Take 10 mg by mouth at bedtime.   chlorzoxazone 500 MG tablet Commonly known as:  PARAFON Take 1 tablet (500 mg total) by mouth 3 (three) times daily as needed. What changed:  reasons to take this   cholecalciferol 1000 units tablet Commonly known as:  VITAMIN D Take 1,000 Units by mouth daily.   GLUCOSAMINE CHONDROITIN JOINT Tabs Take 2  tablets by mouth daily.   LORazepam 0.5 MG tablet Commonly known as:  ATIVAN TAKE 1/2 TO 1 TABLET BY MOUTH AT BEDTIME What changed:    how much to take  how to take this  when to take this  reasons to take this  additional instructions   nortriptyline 10 MG capsule Commonly known as:  PAMELOR TAKE 2 CAPSULES BY MOUTH AT BEDTIME What changed:    how much to take  how to take this  when to take this   pantoprazole 40 MG tablet Commonly known as:  PROTONIX TAKE 1 TABLET (40  MG TOTAL) BY MOUTH 2 (TWO) TIMES DAILY.   polyethylene glycol packet Commonly known as:  MIRALAX / GLYCOLAX Take 17 g by mouth daily as needed for moderate constipation.   STOOL SOFTENER 100 MG capsule Generic drug:  docusate sodium Take 100 mg by mouth at bedtime.        DISCHARGE INSTRUCTIONS:   If you experience worsening of your admission symptoms, develop shortness of breath, life threatening emergency, suicidal or homicidal thoughts you must seek medical attention immediately by calling 911 or calling your MD immediately  if symptoms less severe.  You Must read complete instructions/literature along with all the possible adverse reactions/side effects for all the Medicines you take and that have been prescribed to you. Take any new Medicines after you have completely understood and accept all the possible adverse reactions/side effects.   Please note  You were cared for by a hospitalist during your hospital stay. If you have any questions about your discharge medications or the care you received while you were in the hospital after you are discharged, you can call the unit and asked to speak with the hospitalist on call if the hospitalist that took care of you is not available. Once you are discharged, your primary care physician will handle any further medical issues. Please note that NO REFILLS for any discharge medications will be authorized once you are discharged, as it is imperative that you return to your primary care physician (or establish a relationship with a primary care physician if you do not have one) for your aftercare needs so that they can reassess your need for medications and monitor your lab values.    Today   CHIEF COMPLAINT:   Chief Complaint  Patient presents with  . Shortness of Breath    HISTORY OF PRESENT ILLNESS:  72 y.o. female who presents with shortness of breath, fatigue.  Patient states that this is been going on since April, but got acutely  worse over the last 3 to 7 days.  She is gotten significantly dyspneic on exertion over that period of time, even just with normal daily activities.  This is happened to her in the past about 2 years ago, at that time she was found to have become profoundly anemic.  She had EGD work-up which did not elucidate cause for her hemoglobin drop, though it was performed sometime after her initial symptoms started at that time.  She has been receiving IV infusions since then to maintain her hemoglobin.  Tonight her hemoglobin is back down to 9.  Hospitalist were called for admission for symptomatic anemia  VITAL SIGNS:  Blood pressure (!) 118/59, pulse 76, temperature 97.6 F (36.4 C), temperature source Oral, resp. rate 18, height 5\' 8"  (1.727 m), weight 105.9 kg (233 lb 7.5 oz), SpO2 100 %.  I/O:    Intake/Output Summary (Last 24 hours) at 06/17/2018 1124 Last  data filed at 06/17/2018 0740 Gross per 24 hour  Intake 1017 ml  Output 175 ml  Net 842 ml    PHYSICAL EXAMINATION:  GENERAL:  72 y.o.-year-old patient lying in the bed with no acute distress.  EYES: Pupils equal, round, reactive to light and accommodation. No scleral icterus. Extraocular muscles intact.  HEENT: Head atraumatic, normocephalic. Oropharynx and nasopharynx clear.  NECK:  Supple, no jugular venous distention. No thyroid enlargement, no tenderness.  LUNGS: Normal breath sounds bilaterally, no wheezing, rales,rhonchi or crepitation. No use of accessory muscles of respiration.  CARDIOVASCULAR: S1, S2 normal. No murmurs, rubs, or gallops.  ABDOMEN: Soft, non-tender, non-distended. Bowel sounds present. No organomegaly or mass.  EXTREMITIES: No pedal edema, cyanosis, or clubbing.  NEUROLOGIC: Cranial nerves II through XII are intact. Muscle strength 5/5 in all extremities. Sensation intact. Gait not checked.  PSYCHIATRIC: The patient is alert and oriented x 3.  SKIN: No obvious rash, lesion, or ulcer.   DATA REVIEW:   CBC Recent  Labs  Lab 06/16/18 0406 06/17/18 0548  WBC 7.5  --   HGB 9.3* 10.0*  HCT 28.2*  --   PLT 324  --     Chemistries  Recent Labs  Lab 06/15/18 1844 06/16/18 0406  NA 139 140  K 4.6 4.2  CL 109 113*  CO2 22 22  GLUCOSE 108* 103*  BUN 27* 22  CREATININE 0.89 0.78  CALCIUM 9.1 8.8*  AST 18  --   ALT 15  --   ALKPHOS 108  --   BILITOT 0.5  --     Cardiac Enzymes Recent Labs  Lab 06/15/18 1844  TROPONINI <0.03    Microbiology Results  No results found for this or any previous visit.  RADIOLOGY:  Dg Chest 2 View  Result Date: 06/15/2018 CLINICAL DATA:  Shortness of breath for 10 days EXAM: CHEST - 2 VIEW COMPARISON:  February 08, 2016 FINDINGS: There is no edema or consolidation. The heart size and pulmonary vascularity are normal. No adenopathy. There is a moderate hiatal hernia. There is upper lumbar levoscoliosis. IMPRESSION: No edema or consolidation.  Moderate hiatal hernia present. Electronically Signed   By: Lowella Grip III M.D.   On: 06/15/2018 19:05    EKG:   Orders placed or performed during the hospital encounter of 06/15/18  . EKG 12-Lead  . EKG 12-Lead  . ED EKG  . ED EKG  . EKG      Management plans discussed with the patient, family and they are in agreement.  CODE STATUS:     Code Status Orders  (From admission, onward)        Start     Ordered   06/15/18 2155  Full code  Continuous     06/15/18 2154    Code Status History    This patient has a current code status but no historical code status.      TOTAL TIME TAKING CARE OF THIS PATIENT: 45 minutes.    Avel Peace Salary M.D on 06/17/2018 at 11:24 AM  Between 7am to 6pm - Pager - [phone]  After 6pm go to [email or web address] - password [email or web address] [place] [facility]  Office  [phone]  CC: Primary care physician; [name], MD   Note: This dictation was prepared with Dragon dictation along with smaller phrase technology. Any transcriptional errors  that result from this process are unintentional.

## 2018-06-17 NOTE — Progress Notes (Addendum)
Patient returned from endoscopy.  Order received for heart healthy diet and discontinue IVF.

## 2018-06-18 ENCOUNTER — Telehealth: Payer: Self-pay | Admitting: *Deleted

## 2018-06-18 NOTE — Telephone Encounter (Signed)
Spoke to pts husband who states pt is currently asleep and he prefers she answer TCM questions. States he will have her contact office when awake

## 2018-06-21 ENCOUNTER — Encounter: Payer: Self-pay | Admitting: Internal Medicine

## 2018-06-21 ENCOUNTER — Other Ambulatory Visit: Payer: Self-pay

## 2018-06-21 ENCOUNTER — Telehealth: Payer: Self-pay | Admitting: Gastroenterology

## 2018-06-21 ENCOUNTER — Ambulatory Visit (INDEPENDENT_AMBULATORY_CARE_PROVIDER_SITE_OTHER): Payer: PPO | Admitting: Internal Medicine

## 2018-06-21 VITALS — BP 124/70 | HR 89 | Temp 98.5°F | Ht 66.0 in | Wt 235.0 lb

## 2018-06-21 DIAGNOSIS — M48062 Spinal stenosis, lumbar region with neurogenic claudication: Secondary | ICD-10-CM | POA: Diagnosis not present

## 2018-06-21 DIAGNOSIS — D500 Iron deficiency anemia secondary to blood loss (chronic): Secondary | ICD-10-CM

## 2018-06-21 DIAGNOSIS — K2950 Unspecified chronic gastritis without bleeding: Secondary | ICD-10-CM | POA: Diagnosis not present

## 2018-06-21 DIAGNOSIS — D649 Anemia, unspecified: Secondary | ICD-10-CM

## 2018-06-21 DIAGNOSIS — K2970 Gastritis, unspecified, without bleeding: Secondary | ICD-10-CM | POA: Insufficient documentation

## 2018-06-21 LAB — CBC
HEMATOCRIT: 28.6 % — AB (ref 36.0–46.0)
Hemoglobin: 9.3 g/dL — ABNORMAL LOW (ref 12.0–15.0)
MCHC: 32.5 g/dL (ref 30.0–36.0)
MCV: 80.4 fl (ref 78.0–100.0)
Platelets: 314 10*3/uL (ref 150.0–400.0)
RBC: 3.56 Mil/uL — ABNORMAL LOW (ref 3.87–5.11)
RDW: 15.8 % — AB (ref 11.5–15.5)
WBC: 6.6 10*3/uL (ref 4.0–10.5)

## 2018-06-21 LAB — SURGICAL PATHOLOGY

## 2018-06-21 NOTE — Progress Notes (Signed)
Subjective:    Patient ID: Alice Donaldson, female    DOB: 1946-03-21, 72 y.o.   MRN: [id number]  HPI Here for hospital follow up  Had gone to ER for SOB and palpitation in chest and head Worried about MI or the anemia Did have some improvement with rest--then recurred with any exertion  Was admitted with anemia again Also had some sweats and other milder symptoms---before the severe DOE recurred Last visit with Dr Grayland Ormond over a year ago  Did get transfused one unit EGD showed some gastritis---colonoscopy negative No clear indication of where the bleeding came from Dr Vicente Males had discussed doing a capsule endoscopy if EGD/colon not diagnostic  Etodolac stopped---but she continued for her pain Was told to go back on the ASA though  Current Outpatient Medications on File Prior to Visit  Medication Sig Dispense Refill  . albuterol (PROAIR HFA) 108 (90 Base) MCG/ACT inhaler INHALE 2 PUFFS INTO THE LUNGS EVERY 6 (SIX) HOURS AS NEEDED FOR WHEEZING. (Patient taking differently: Inhale 2 puffs into the lungs every 6 (six) hours as needed for wheezing. ) 8.5 each 1  . aspirin EC 81 MG tablet Take 81 mg by mouth daily.    . cetirizine (ZYRTEC) 10 MG tablet Take 10 mg by mouth at bedtime.     . chlorzoxazone (PARAFON) 500 MG tablet Take 1 tablet (500 mg total) by mouth 3 (three) times daily as needed. (Patient taking differently: Take 500 mg by mouth 3 (three) times daily as needed for muscle spasms. ) 30 tablet 1  . cholecalciferol (VITAMIN D) 1000 units tablet Take 1,000 Units by mouth daily.    [name] docusate sodium (STOOL SOFTENER) 100 MG capsule Take 100 mg by mouth at bedtime.     [name] etodolac (LODINE) 500 MG tablet Take 1 tablet by mouth 2 (two) times daily.    . Glucos-Chondroit-Hyaluron-MSM (GLUCOSAMINE CHONDROITIN JOINT) TABS Take 2 tablets by mouth daily.    [name] LORazepam (ATIVAN) 0.5 MG tablet TAKE 1/2 TO 1 TABLET BY MOUTH AT BEDTIME (Patient taking differently: Take 0.5-1 mg by mouth at  bedtime as needed for anxiety. TAKE 1/2 TO 1 TABLET BY MOUTH AT BEDTIME) 30 tablet 0  . nortriptyline (PAMELOR) 10 MG capsule TAKE 2 CAPSULES BY MOUTH AT BEDTIME (Patient taking differently: TAKE 1 CAPSULE BY MOUTH AT BEDTIME) 60 capsule 2  . pantoprazole (PROTONIX) 40 MG tablet TAKE 1 TABLET (40 MG TOTAL) BY MOUTH 2 (TWO) TIMES DAILY. 60 tablet 11  . polyethylene glycol (MIRALAX / GLYCOLAX) packet Take 17 g by mouth daily as needed for moderate constipation.      No current facility-administered medications on file prior to visit.     Allergies  Allergen Reactions  . Prevpac [Amoxicill-Clarithro-Lansopraz] Other (See Comments)    Throat closed up  . Latex Rash  . Duloxetine Other (See Comments)    Nausea, diarrhea and jittery    Past Medical History:  Diagnosis Date  . Allergic rhinitis, cause unspecified   . Allergy   . Anemia   . Complication of anesthesia   . Diverticulosis   . DVT (deep venous thrombosis) ([place])   . Gastric ulcer    in past  . GERD (gastroesophageal reflux disease)   . Hemorrhoids, internal   . Hyperlipidemia   . Osteoarthrosis involving, or with mention of more than one site, but not specified as generalized, multiple sites   . PONV (postoperative nausea and vomiting)   . Ulcer (traumatic) of oral mucosa   .  Unspecified venous (peripheral) insufficiency     Past Surgical History:  Procedure Laterality Date  . ABDOMINAL HYSTERECTOMY  1996  . BREAST BIOPSY  2003  . CHOLECYSTECTOMY  2000  . COLONOSCOPY WITH PROPOFOL N/A 10/02/2015   Procedure: COLONOSCOPY WITH PROPOFOL;  Surgeon: Lollie Sails, MD;  Location: [facility] ENDOSCOPY;  Service: Endoscopy;  Laterality: N/A;  . COLONOSCOPY WITH PROPOFOL N/A 06/17/2018   Procedure: COLONOSCOPY WITH PROPOFOL;  Surgeon: Toledo, Benay Pike, MD;  Location: [facility] ENDOSCOPY;  Service: Gastroenterology;  Laterality: N/A;  . DEXA  5/14   Normal  . ESOPHAGOGASTRODUODENOSCOPY (EGD) WITH PROPOFOL N/A 06/17/2018   Procedure:  ESOPHAGOGASTRODUODENOSCOPY (EGD) WITH PROPOFOL;  Surgeon: Toledo, Benay Pike, MD;  Location: [facility] ENDOSCOPY;  Service: Gastroenterology;  Laterality: N/A;  . KNEE ARTHROSCOPY     right in 2007, left in 2011  . TONSILLECTOMY AND ADENOIDECTOMY  childhood    Family History  Problem Relation Age of Onset  . Heart disease Mother   . Heart disease Father   . Diabetes Son 5       Type 1  . Hypertension Son   . Stroke Brother   . Diabetes Other 2       type 1  . Hyperlipidemia Neg Hx   . Depression Neg Hx     Social History   Socioeconomic History  . Marital status: Married    Spouse name: Not on file  . Number of children: 1  . Years of education: Not on file  . Highest education level: Not on file  Occupational History  . 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 Financial resource strain: Not on file  . Food insecurity:    Worry: Not on file    Inability: Not on file  . Transportation needs:    Medical: Not on file    Non-medical: Not on file  Tobacco Use  . Smoking status: Former Smoker    Types: Cigarettes  . Smokeless tobacco: Never Used  Substance and Sexual Activity  . Alcohol use: No    Comment: rare glass of wine  . Drug use: No  . Sexual activity: Not on file  Lifestyle  . Physical activity:    Days per week: Not on file    Minutes per session: Not on file  . Stress: Not on file  Relationships  . Social connections:    Talks on phone: Not on file    Gets together: Not on file    Attends religious service: Not on file    Active member of club or organization: Not on file    Attends meetings of clubs or organizations: Not on file    Relationship status: Not on file  . Intimate partner violence:    Fear of current or ex partner: Not on file    Emotionally abused: Not on file    Physically abused: Not on file    Forced sexual activity: Not on file  Other Topics Concern  . Not on file  Social History Narrative   No living will     Asks for husband to make decisions-- but not sure he can after stroke. Son Joneen Caraway should now do this   Would accept resuscitation   Doesn't want tube feeds if cognitively unaware   Review of Systems  Appetite is not great --but is eating Sleeping okay only with the lorazepam    Objective:   Physical Exam  Constitutional: She appears well-developed. No  distress.  Neck: No thyromegaly present.  Cardiovascular: Normal rate, regular rhythm and normal heart sounds. Exam reveals no gallop.  No murmur heard. Respiratory: Effort normal and breath sounds normal. No respiratory distress. She has no wheezes. She has no rales.  GI: Soft. She exhibits no distension. There is no tenderness. There is no rebound.  Musculoskeletal:  Thick calves--especially on right. No pitting  Lymphadenopathy:    She has no cervical adenopathy.  Psychiatric:  Anxious about status           Assessment & Plan:

## 2018-06-21 NOTE — Telephone Encounter (Signed)
Pt scheduled for a Givens capsule study at [facility] on 07/12/18. Discussed instructions for capsule study and mailed a copy.

## 2018-06-21 NOTE — Assessment & Plan Note (Signed)
Chronic pain Discussed switching to tylenol for pain

## 2018-06-21 NOTE — Telephone Encounter (Signed)
Pt is calling again to set up apt with Dr. Vicente Males

## 2018-06-21 NOTE — Assessment & Plan Note (Signed)
Has preceding deconditioning--then severe DOE, etc with Hgb down to 9 Only slightly better No clear etiology by EGD. Colon normal Probably will need iron infusions again

## 2018-06-21 NOTE — Assessment & Plan Note (Signed)
Not clearly enough to cause the anemia Needs to be considered for capsule study

## 2018-06-21 NOTE — Telephone Encounter (Signed)
Pt left vm she states Dr. Vicente Males told pt to contact office and have nurse schedule a camera study for her please call pt after 2 she has PT before then

## 2018-06-24 ENCOUNTER — Telehealth: Payer: Self-pay | Admitting: Gastroenterology

## 2018-06-24 NOTE — Telephone Encounter (Signed)
Pt would like to cancel procedure 7/29 she does not wish to r/s

## 2018-06-25 ENCOUNTER — Telehealth: Payer: Self-pay

## 2018-06-25 NOTE — Telephone Encounter (Signed)
Contacted patient regarding the cancellation of her capsule study.  She stated that she decided to go with Dr. Alice Reichert because that was the group that performed her upper endoscopy and that they can do her capsule study sooner than we can.  In addition to this she said that they are in network on her insurance.    Thanks [employer]

## 2018-06-25 NOTE — Telephone Encounter (Signed)
LVM for pt to call office regarding referral request.  Thanks Sharyn Lull

## 2018-06-27 NOTE — Progress Notes (Signed)
[place]  [phone] [phone]  ID: Alice Donaldson OB: [birth year]  MR#: [id number]  TIR#:[id number]  Patient Care Team: [name], MD as PCP - General (Pediatrics)  CHIEF COMPLAINT: Iron deficiency anemia.  INTERVAL HISTORY: Patient returns to clinic today for repeat laboratory for further evaluation.  She was recently discharged from the hospital after being admitted for severe anemia requiring 1 unit of packed red blood cells.  She had colonoscopy and EGD that revealed gastritis, but no definitive source.  She continues to feel weak and fatigue, but this is improved.  She has no neurologic complaints. She denies any recent fevers or illnesses. She has a good appetite and denies weight loss. She has no chest pain or shortness of breath. She denies any nausea, vomiting, constipation, or diarrhea. She denies any melena or hematochezia. She has no urinary complaints.  Patient offers no further specific complaints today.  REVIEW OF SYSTEMS:   Review of Systems  Constitutional: Positive for malaise/fatigue. Negative for fever and weight loss.  Respiratory: Negative.  Negative for cough, hemoptysis and shortness of breath.   Cardiovascular: Negative.  Negative for chest pain and leg swelling.  Gastrointestinal: Negative.  Negative for abdominal pain, blood in stool and melena.  Genitourinary: Negative.  Negative for hematuria.  Musculoskeletal: Negative.  Negative for back pain.  Skin: Negative.  Negative for rash.  Neurological: Positive for weakness. Negative for sensory change, focal weakness and headaches.  Psychiatric/Behavioral: Negative.  The patient is not nervous/anxious.     As per HPI. Otherwise, a complete review of systems is negative.  PAST MEDICAL HISTORY: Past Medical History:  Diagnosis Date  . Allergic rhinitis, cause unspecified   . Allergy   . Anemia   . Complication of anesthesia   . Diverticulosis   . DVT (deep  venous thrombosis) ([place])   . Gastric ulcer    in past  . GERD (gastroesophageal reflux disease)   . Hemorrhoids, internal   . Hyperlipidemia   . Osteoarthrosis involving, or with mention of more than one site, but not specified as generalized, multiple sites   . PONV (postoperative nausea and vomiting)   . Ulcer (traumatic) of oral mucosa   . Unspecified venous (peripheral) insufficiency     PAST SURGICAL HISTORY: Past Surgical History:  Procedure Laterality Date  . ABDOMINAL HYSTERECTOMY  1996  . BREAST BIOPSY  2003  . CHOLECYSTECTOMY  2000  . COLONOSCOPY WITH PROPOFOL N/A 10/02/2015   Procedure: COLONOSCOPY WITH PROPOFOL;  Surgeon: Lollie Sails, MD;  Location[facility] ENDOSCOPY;  Service: Endoscopy;  Laterality: N/A;  . COLONOSCOPY WITH PROPOFOL N/A 06/17/2018   Procedure: COLONOSCOPY WITH PROPOFOL;  Surgeon: Toledo, Benay Pike, MD;  Location: [facility] ENDOSCOPY;  Service: Gastroenterology;  Laterality: N/A;  . DEXA  5/14   Normal  . ESOPHAGOGASTRODUODENOSCOPY (EGD) WITH PROPOFOL N/A 06/17/2018   Procedure: ESOPHAGOGASTRODUODENOSCOPY (EGD) WITH PROPOFOL;  Surgeon: Toledo, Benay Pike, MD;  Location: [facility] ENDOSCOPY;  Service: Gastroenterology;  Laterality: N/A;  . KNEE ARTHROSCOPY     right in 2007, left in 2011  . TONSILLECTOMY AND ADENOIDECTOMY  childhood    FAMILY HISTORY: Family History  Problem Relation Age of Onset  . Heart disease Mother   . Heart disease Father   . Diabetes Son 5       Type 1  . Hypertension Son   . Stroke Brother   . Diabetes Other 2       type 1  . Hyperlipidemia Neg  Hx   . Depression Neg Hx     ADVANCED DIRECTIVES (Y/N):  N  HEALTH MAINTENANCE: Social History   Tobacco Use  . Smoking status: Former Smoker    Types: Cigarettes  . Smokeless tobacco: Never Used  Substance Use Topics  . Alcohol use: No    Comment: rare glass of wine  . Drug use: No     Colonoscopy:  PAP:  Bone density:  Lipid panel:  Allergies  Allergen Reactions  .  Prevpac [Amoxicill-Clarithro-Lansopraz] Other (See Comments)    Throat closed up  . Latex Rash  . Duloxetine Other (See Comments)    Nausea, diarrhea and jittery  . Tramadol     GI symptoms    Current Outpatient Medications  Medication Sig Dispense Refill  . acetaminophen (TYLENOL) 500 MG tablet Take 1,000 mg by mouth 3 (three) times daily.    [name] albuterol (PROAIR HFA) 108 (90 Base) MCG/ACT inhaler INHALE 2 PUFFS INTO THE LUNGS EVERY 6 (SIX) HOURS AS NEEDED FOR WHEEZING. (Patient taking differently: Inhale 2 puffs into the lungs every 6 (six) hours as needed for wheezing. ) 8.5 each 1  . cetirizine (ZYRTEC) 10 MG tablet Take 10 mg by mouth at bedtime.     . chlorzoxazone (PARAFON) 500 MG tablet Take 1 tablet (500 mg total) by mouth 3 (three) times daily as needed. (Patient taking differently: Take 500 mg by mouth 3 (three) times daily as needed for muscle spasms. ) 30 tablet 1  . cholecalciferol (VITAMIN D) 1000 units tablet Take 1,000 Units by mouth daily.    [name] docusate sodium (STOOL SOFTENER) 100 MG capsule Take 100 mg by mouth at bedtime.     . Glucos-Chondroit-Hyaluron-MSM (GLUCOSAMINE CHONDROITIN JOINT) TABS Take 2 tablets by mouth daily.    [name] LORazepam (ATIVAN) 0.5 MG tablet TAKE 1/2 TO 1 TABLET BY MOUTH AT BEDTIME (Patient taking differently: Take 0.5-1 mg by mouth at bedtime as needed for anxiety. TAKE 1/2 TO 1 TABLET BY MOUTH AT BEDTIME) 30 tablet 0  . nortriptyline (PAMELOR) 10 MG capsule TAKE 2 CAPSULES BY MOUTH AT BEDTIME (Patient taking differently: TAKE 1 CAPSULE BY MOUTH AT BEDTIME) 60 capsule 2  . pantoprazole (PROTONIX) 40 MG tablet TAKE 1 TABLET (40 MG TOTAL) BY MOUTH 2 (TWO) TIMES DAILY. 60 tablet 11  . polyethylene glycol (MIRALAX / GLYCOLAX) packet Take 17 g by mouth daily as needed for moderate constipation.      No current facility-administered medications for this visit.     OBJECTIVE: Vitals:   06/29/18 1351  BP: 124/78  Pulse: 82  Resp: 18  Temp: 97.8 F (36.6  C)  SpO2: 98%     Body mass index is 37.45 kg/m.    ECOG FS:0 - Asymptomatic  General: Well-developed, well-nourished, no acute distress. Eyes: Pink conjunctiva, anicteric sclera. HEENT: Normocephalic, moist mucous membranes. Lungs: Clear to auscultation bilaterally. Heart: Regular rate and rhythm. No rubs, murmurs, or gallops. Abdomen: Soft, nontender, nondistended. No organomegaly noted, normoactive bowel sounds. Musculoskeletal: No edema, cyanosis, or clubbing. Neuro: Alert, answering all questions appropriately. Cranial nerves grossly intact. Skin: No rashes or petechiae noted. Psych: Normal affect.   LAB RESULTS:  Lab Results  Component Value Date   NA 140 06/16/2018   K 4.2 06/16/2018   CL 113 (H) 06/16/2018   CO2 22 06/16/2018   GLUCOSE 103 (H) 06/16/2018   BUN 22 06/16/2018   CREATININE 0.78 06/16/2018   CALCIUM 8.8 (L) 06/16/2018   PROT 7.1 06/15/2018  ALBUMIN 3.6 06/15/2018   AST 18 06/15/2018   ALT 15 06/15/2018   ALKPHOS 108 06/15/2018   BILITOT 0.5 06/15/2018   GFRNONAA >60 06/16/2018   GFRAA >60 06/16/2018    Lab Results  Component Value Date   WBC 7.9 06/29/2018   NEUTROABS 5.4 06/29/2018   HGB 9.8 (L) 06/29/2018   HCT 30.8 (L) 06/29/2018   MCV 79.0 (L) 06/29/2018   PLT 413 06/29/2018   Lab Results  Component Value Date   IRON 13 (L) 06/29/2018   TIBC 485 (H) 06/29/2018   IRONPCTSAT 3 (L) 06/29/2018   Lab Results  Component Value Date   FERRITIN 5 (L) 06/29/2018     STUDIES: Dg Chest 2 View  Result Date: 06/15/2018 CLINICAL DATA:  Shortness of breath for 10 days EXAM: CHEST - 2 VIEW COMPARISON:  February 08, 2016 FINDINGS: There is no edema or consolidation. The heart size and pulmonary vascularity are normal. No adenopathy. There is a moderate hiatal hernia. There is upper lumbar levoscoliosis. IMPRESSION: No edema or consolidation.  Moderate hiatal hernia present. Electronically Signed   By: Lowella Grip III M.D.   On: 06/15/2018  19:05    ASSESSMENT: Iron deficiency anemia.  PLAN:    1.  Iron deficiency anemia: Patient's hemoglobin and iron stores are significantly reduced despite receiving packed red blood cells in the hospital several weeks ago.  EGD and colonoscopy on June 17, 2018 did not reveal specific source of GI bleed, but did note chronic gastritis.  Patient continues to be symptomatic, therefore will receive an additional 510 mg IV Feraheme today.  Return to clinic in 3 months with repeat laboratory work, further evaluation, and consider patient of additional IV iron.  .  I spent a total of 30 minutes face-to-face with the patient of which greater than 50% of the visit was spent in counseling and coordination of care as detailed above.   Patient expressed understanding and was in agreement with this plan. She also understands that She can call clinic at any time with any questions, concerns, or complaints.    Lloyd Huger, MD   07/04/2018 8:31 AM

## 2018-06-29 ENCOUNTER — Encounter: Payer: Self-pay | Admitting: Oncology

## 2018-06-29 ENCOUNTER — Inpatient Hospital Stay (HOSPITAL_BASED_OUTPATIENT_CLINIC_OR_DEPARTMENT_OTHER): Payer: PPO | Admitting: Oncology

## 2018-06-29 ENCOUNTER — Inpatient Hospital Stay: Payer: PPO

## 2018-06-29 ENCOUNTER — Other Ambulatory Visit: Payer: Self-pay

## 2018-06-29 ENCOUNTER — Inpatient Hospital Stay: Payer: PPO | Attending: Oncology

## 2018-06-29 VITALS — BP 124/78 | HR 82 | Temp 97.8°F | Resp 18 | Wt 232.0 lb

## 2018-06-29 VITALS — BP 111/56 | HR 80 | Resp 20

## 2018-06-29 DIAGNOSIS — R5383 Other fatigue: Secondary | ICD-10-CM | POA: Insufficient documentation

## 2018-06-29 DIAGNOSIS — D509 Iron deficiency anemia, unspecified: Secondary | ICD-10-CM | POA: Insufficient documentation

## 2018-06-29 DIAGNOSIS — D500 Iron deficiency anemia secondary to blood loss (chronic): Secondary | ICD-10-CM

## 2018-06-29 DIAGNOSIS — R531 Weakness: Secondary | ICD-10-CM | POA: Insufficient documentation

## 2018-06-29 DIAGNOSIS — Z87891 Personal history of nicotine dependence: Secondary | ICD-10-CM | POA: Insufficient documentation

## 2018-06-29 LAB — CBC WITH DIFFERENTIAL/PLATELET
Basophils Absolute: 0.1 10*3/uL (ref 0–0.1)
Basophils Relative: 1 %
EOS ABS: 0.2 10*3/uL (ref 0–0.7)
EOS PCT: 2 %
HCT: 30.8 % — ABNORMAL LOW (ref 35.0–47.0)
Hemoglobin: 9.8 g/dL — ABNORMAL LOW (ref 12.0–16.0)
LYMPHS PCT: 19 %
Lymphs Abs: 1.5 10*3/uL (ref 1.0–3.6)
MCH: 25.3 pg — AB (ref 26.0–34.0)
MCHC: 32 g/dL (ref 32.0–36.0)
MCV: 79 fL — AB (ref 80.0–100.0)
MONO ABS: 0.7 10*3/uL (ref 0.2–0.9)
Monocytes Relative: 9 %
Neutro Abs: 5.4 10*3/uL (ref 1.4–6.5)
Neutrophils Relative %: 69 %
PLATELETS: 413 10*3/uL (ref 150–440)
RBC: 3.89 MIL/uL (ref 3.80–5.20)
RDW: 16.1 % — AB (ref 11.5–14.5)
WBC: 7.9 10*3/uL (ref 3.6–11.0)

## 2018-06-29 LAB — IRON AND TIBC
IRON: 13 ug/dL — AB (ref 28–170)
Saturation Ratios: 3 % — ABNORMAL LOW (ref 10.4–31.8)
TIBC: 485 ug/dL — AB (ref 250–450)
UIBC: 472 ug/dL

## 2018-06-29 LAB — FERRITIN: FERRITIN: 5 ng/mL — AB (ref 11–307)

## 2018-06-29 MED ORDER — SODIUM CHLORIDE 0.9 % IV SOLN
Freq: Once | INTRAVENOUS | Status: AC
  Administered 2018-06-29: 15:00:00 via INTRAVENOUS
  Filled 2018-06-29: qty 1000

## 2018-06-29 MED ORDER — SODIUM CHLORIDE 0.9 % IV SOLN
510.0000 mg | Freq: Once | INTRAVENOUS | Status: AC
  Administered 2018-06-29: 510 mg via INTRAVENOUS
  Filled 2018-06-29: qty 17

## 2018-06-29 NOTE — Progress Notes (Signed)
Pt discharged from hospital on 06/17/18, reports still "very tired and short of breath".

## 2018-07-05 DIAGNOSIS — D509 Iron deficiency anemia, unspecified: Secondary | ICD-10-CM | POA: Diagnosis not present

## 2018-07-12 ENCOUNTER — Ambulatory Visit: Admission: RE | Admit: 2018-07-12 | Payer: PPO | Source: Ambulatory Visit | Admitting: Gastroenterology

## 2018-07-12 ENCOUNTER — Encounter: Admission: RE | Payer: Self-pay | Source: Ambulatory Visit

## 2018-07-12 SURGERY — IMAGING PROCEDURE, GI TRACT, INTRALUMINAL, VIA CAPSULE

## 2018-07-13 DIAGNOSIS — T184XXA Foreign body in colon, initial encounter: Secondary | ICD-10-CM | POA: Diagnosis not present

## 2018-07-14 DIAGNOSIS — T184XXA Foreign body in colon, initial encounter: Secondary | ICD-10-CM | POA: Diagnosis not present

## 2018-07-19 ENCOUNTER — Other Ambulatory Visit: Payer: Self-pay | Admitting: Internal Medicine

## 2018-07-25 ENCOUNTER — Other Ambulatory Visit: Payer: Self-pay

## 2018-07-26 MED ORDER — LORAZEPAM 0.5 MG PO TABS: ORAL_TABLET | ORAL | 0 refills | Status: DC

## 2018-07-26 NOTE — Telephone Encounter (Signed)
Last filled 03-01-18 #30 Last OV 06-21-18 Next OV 10-11-18

## 2018-07-30 DIAGNOSIS — R109 Unspecified abdominal pain: Secondary | ICD-10-CM | POA: Diagnosis not present

## 2018-07-30 DIAGNOSIS — T184XXA Foreign body in colon, initial encounter: Secondary | ICD-10-CM | POA: Diagnosis not present

## 2018-08-13 DIAGNOSIS — M79671 Pain in right foot: Secondary | ICD-10-CM | POA: Diagnosis not present

## 2018-08-21 ENCOUNTER — Other Ambulatory Visit: Payer: Self-pay | Admitting: Internal Medicine

## 2018-09-03 ENCOUNTER — Other Ambulatory Visit: Payer: Self-pay | Admitting: Internal Medicine

## 2018-09-03 MED ORDER — ETODOLAC 500 MG PO TABS: 500.0000 mg | ORAL_TABLET | Freq: Two times a day (BID) | ORAL | 2 refills | Status: DC

## 2018-09-16 ENCOUNTER — Ambulatory Visit (INDEPENDENT_AMBULATORY_CARE_PROVIDER_SITE_OTHER): Payer: PPO

## 2018-09-16 DIAGNOSIS — Z23 Encounter for immunization: Secondary | ICD-10-CM | POA: Diagnosis not present

## 2018-09-30 ENCOUNTER — Ambulatory Visit: Payer: PPO | Admitting: Psychology

## 2018-09-30 DIAGNOSIS — F4323 Adjustment disorder with mixed anxiety and depressed mood: Secondary | ICD-10-CM | POA: Diagnosis not present

## 2018-10-03 NOTE — Progress Notes (Signed)
[place]  [phone] [phone]  ID: Alice Donaldson OB: 1946-05-07  MR#: [id number]  VOJ#:[id number]  Patient Care Team: [name], MD as PCP - General (Pediatrics)  CHIEF COMPLAINT: Iron deficiency anemia.  INTERVAL HISTORY: Patient returns to clinic today for repeat laboratory work, further evaluation, consideration of additional IV iron.  She continues to have chronic weakness and fatigue and states she is always "cold", but otherwise feels well.  She has no neurologic complaints. She denies any recent fevers or illnesses. She has a good appetite and denies weight loss. She has no chest pain, but does admit to occasional dyspnea on exertion. She denies any nausea, vomiting, constipation, or diarrhea. She denies any melena or hematochezia. She has no urinary complaints.  Patient offers no further specific complaints today.  REVIEW OF SYSTEMS:   Review of Systems  Constitutional: Positive for malaise/fatigue. Negative for fever and weight loss.  Respiratory: Negative.  Negative for cough, hemoptysis and shortness of breath.   Cardiovascular: Negative.  Negative for chest pain and leg swelling.  Gastrointestinal: Negative.  Negative for abdominal pain, blood in stool and melena.  Genitourinary: Negative.  Negative for hematuria.  Musculoskeletal: Negative.  Negative for back pain.  Skin: Negative.  Negative for rash.  Neurological: Positive for weakness. Negative for sensory change, focal weakness and headaches.  Psychiatric/Behavioral: Negative.  The patient is not nervous/anxious.     As per HPI. Otherwise, a complete review of systems is negative.  PAST MEDICAL HISTORY: Past Medical History:  Diagnosis Date  . Allergic rhinitis, cause unspecified   . Allergy   . Anemia   . Complication of anesthesia   . Diverticulosis   . DVT (deep venous thrombosis) ([place])   . Gastric ulcer    in past  . GERD (gastroesophageal reflux  disease)   . Hemorrhoids, internal   . Hyperlipidemia   . Osteoarthrosis involving, or with mention of more than one site, but not specified as generalized, multiple sites   . PONV (postoperative nausea and vomiting)   . Ulcer (traumatic) of oral mucosa   . Unspecified venous (peripheral) insufficiency     PAST SURGICAL HISTORY: Past Surgical History:  Procedure Laterality Date  . ABDOMINAL HYSTERECTOMY  1996  . BREAST BIOPSY  2003  . CHOLECYSTECTOMY  2000  . COLONOSCOPY WITH PROPOFOL N/A 10/02/2015   Procedure: COLONOSCOPY WITH PROPOFOL;  Surgeon: Lollie Sails, MD;  Location: [facility] ENDOSCOPY;  Service: Endoscopy;  Laterality: N/A;  . COLONOSCOPY WITH PROPOFOL N/A 06/17/2018   Procedure: COLONOSCOPY WITH PROPOFOL;  Surgeon: Toledo, Benay Pike, MD;  Location: [facility] ENDOSCOPY;  Service: Gastroenterology;  Laterality: N/A;  . DEXA  5/14   Normal  . ESOPHAGOGASTRODUODENOSCOPY (EGD) WITH PROPOFOL N/A 06/17/2018   Procedure: ESOPHAGOGASTRODUODENOSCOPY (EGD) WITH PROPOFOL;  Surgeon: Toledo, Benay Pike, MD;  Location: [facility] ENDOSCOPY;  Service: Gastroenterology;  Laterality: N/A;  . KNEE ARTHROSCOPY     right in 2007, left in 2011  . TONSILLECTOMY AND ADENOIDECTOMY  childhood    FAMILY HISTORY: Family History  Problem Relation Age of Onset  . Heart disease Mother   . Heart disease Father   . Diabetes Son 5       Type 1  . Hypertension Son   . Stroke Brother   . Diabetes Other 2       type 1  . Hyperlipidemia Neg Hx   . Depression Neg Hx     ADVANCED DIRECTIVES (Y/N):  N  HEALTH MAINTENANCE:  Social History   Tobacco Use  . Smoking status: Former Smoker    Types: Cigarettes  . Smokeless tobacco: Never Used  Substance Use Topics  . Alcohol use: No    Comment: rare glass of wine  . Drug use: No     Colonoscopy:  PAP:  Bone density:  Lipid panel:  Allergies  Allergen Reactions  . Prevpac [Amoxicill-Clarithro-Lansopraz] Other (See Comments)    Throat closed up  .  Latex Rash  . Duloxetine Other (See Comments)    Nausea, diarrhea and jittery  . Tramadol     GI symptoms    Current Outpatient Medications  Medication Sig Dispense Refill  . acetaminophen (TYLENOL) 500 MG tablet Take 1,000 mg by mouth 3 (three) times daily.    [name] albuterol (PROAIR HFA) 108 (90 Base) MCG/ACT inhaler INHALE 2 PUFFS INTO THE LUNGS EVERY 6 (SIX) HOURS AS NEEDED FOR WHEEZING. (Patient taking differently: Inhale 2 puffs into the lungs every 6 (six) hours as needed for wheezing. ) 8.5 each 1  . cetirizine (ZYRTEC) 10 MG tablet Take 10 mg by mouth at bedtime.     . chlorzoxazone (PARAFON) 500 MG tablet Take 1 tablet (500 mg total) by mouth 3 (three) times daily as needed. (Patient taking differently: Take 500 mg by mouth 3 (three) times daily as needed for muscle spasms. ) 30 tablet 1  . cholecalciferol (VITAMIN D) 1000 units tablet Take 1,000 Units by mouth daily.    [name] docusate sodium (STOOL SOFTENER) 100 MG capsule Take 100 mg by mouth at bedtime.     [name] etodolac (LODINE) 500 MG tablet Take 1 tablet (500 mg total) by mouth 2 (two) times daily. 60 tablet 2  . Glucos-Chondroit-Hyaluron-MSM (GLUCOSAMINE CHONDROITIN JOINT) TABS Take 2 tablets by mouth daily.    [name] LORazepam (ATIVAN) 0.5 MG tablet TAKE 1/2 TO 1 TABLET BY MOUTH AT BEDTIME 30 tablet 0  . nortriptyline (PAMELOR) 10 MG capsule TAKE 1 CAPSULE (10 MG TOTAL) BY MOUTH AT BEDTIME. 90 capsule 0  . pantoprazole (PROTONIX) 40 MG tablet TAKE 1 TABLET (40 MG TOTAL) BY MOUTH 2 (TWO) TIMES DAILY. 60 tablet 11  . polyethylene glycol (MIRALAX / GLYCOLAX) packet Take 17 g by mouth daily as needed for moderate constipation.      No current facility-administered medications for this visit.     OBJECTIVE: Vitals:   10/07/18 1109 10/07/18 1121  BP:  (!) 142/82  Pulse:  84  Resp: 20   Temp:  (!) 97.4 F (36.3 C)     Body mass index is 36.22 kg/m.    ECOG FS:0 - Asymptomatic  General: Well-developed, well-nourished, no acute  distress. Eyes: Pink conjunctiva, anicteric sclera. HEENT: Normocephalic, moist mucous membranes. Lungs: Clear to auscultation bilaterally. Heart: Regular rate and rhythm. No rubs, murmurs, or gallops. Abdomen: Soft, nontender, nondistended. No organomegaly noted, normoactive bowel sounds. Musculoskeletal: No edema, cyanosis, or clubbing. Neuro: Alert, answering all questions appropriately. Cranial nerves grossly intact. Skin: No rashes or petechiae noted. Psych: Normal affect.  LAB RESULTS:  Lab Results  Component Value Date   NA 140 06/16/2018   K 4.2 06/16/2018   CL 113 (H) 06/16/2018   CO2 22 06/16/2018   GLUCOSE 103 (H) 06/16/2018   BUN 22 06/16/2018   CREATININE 0.78 06/16/2018   CALCIUM 8.8 (L) 06/16/2018   PROT 7.1 06/15/2018   ALBUMIN 3.6 06/15/2018   AST 18 06/15/2018   ALT 15 06/15/2018   ALKPHOS 108 06/15/2018   BILITOT 0.5  06/15/2018   GFRNONAA >60 06/16/2018   GFRAA >60 06/16/2018    Lab Results  Component Value Date   WBC 6.2 10/07/2018   NEUTROABS 3.4 10/07/2018   HGB 10.1 (L) 10/07/2018   HCT 34.4 (L) 10/07/2018   MCV 77.7 (L) 10/07/2018   PLT 334 10/07/2018   Lab Results  Component Value Date   IRON 22 (L) 10/07/2018   TIBC 429 10/07/2018   IRONPCTSAT 5 (L) 10/07/2018   Lab Results  Component Value Date   FERRITIN 7 (L) 10/07/2018     STUDIES: No results found.  ASSESSMENT: Iron deficiency anemia.  PLAN:    1.  Iron deficiency anemia: Patient's hemoglobin is only mildly improved, but her iron stores remain significantly decreased.  EGD and colonoscopy on June 17, 2018 did not reveal specific source of GI bleed, but did note chronic gastritis.  Proceed with 510 mg IV Feraheme today.  Patient will return to clinic in 1 week for second infusion.  She was then return to clinic in 3 months for further evaluation and consideration of additional IV iron.    I spent a total of 30 minutes face-to-face with the patient of which greater than 50% of  the visit was spent in counseling and coordination of care as detailed above.   Patient expressed understanding and was in agreement with this plan. She also understands that She can call clinic at any time with any questions, concerns, or complaints.    Lloyd Huger, MD   10/11/2018 3:20 PM

## 2018-10-06 ENCOUNTER — Other Ambulatory Visit: Payer: Self-pay | Admitting: *Deleted

## 2018-10-06 DIAGNOSIS — D649 Anemia, unspecified: Secondary | ICD-10-CM

## 2018-10-07 ENCOUNTER — Inpatient Hospital Stay: Payer: PPO | Admitting: Oncology

## 2018-10-07 ENCOUNTER — Inpatient Hospital Stay: Payer: PPO | Attending: Oncology

## 2018-10-07 ENCOUNTER — Other Ambulatory Visit: Payer: Self-pay

## 2018-10-07 ENCOUNTER — Encounter: Payer: Self-pay | Admitting: Oncology

## 2018-10-07 ENCOUNTER — Inpatient Hospital Stay: Payer: PPO

## 2018-10-07 VITALS — BP 142/82 | HR 84 | Temp 97.4°F | Resp 20 | Ht 68.0 in | Wt 238.2 lb

## 2018-10-07 VITALS — BP 141/84 | HR 78 | Temp 96.6°F | Resp 18

## 2018-10-07 DIAGNOSIS — Z87891 Personal history of nicotine dependence: Secondary | ICD-10-CM | POA: Diagnosis not present

## 2018-10-07 DIAGNOSIS — D500 Iron deficiency anemia secondary to blood loss (chronic): Secondary | ICD-10-CM

## 2018-10-07 DIAGNOSIS — D509 Iron deficiency anemia, unspecified: Secondary | ICD-10-CM | POA: Diagnosis not present

## 2018-10-07 DIAGNOSIS — D649 Anemia, unspecified: Secondary | ICD-10-CM

## 2018-10-07 LAB — CBC WITH DIFFERENTIAL/PLATELET
Abs Immature Granulocytes: 0.02 10*3/uL (ref 0.00–0.07)
BASOS ABS: 0.1 10*3/uL (ref 0.0–0.1)
BASOS PCT: 1 %
EOS ABS: 0.2 10*3/uL (ref 0.0–0.5)
EOS PCT: 4 %
HEMATOCRIT: 34.4 % — AB (ref 36.0–46.0)
Hemoglobin: 10.1 g/dL — ABNORMAL LOW (ref 12.0–15.0)
IMMATURE GRANULOCYTES: 0 %
LYMPHS ABS: 1.8 10*3/uL (ref 0.7–4.0)
Lymphocytes Relative: 28 %
MCH: 22.8 pg — ABNORMAL LOW (ref 26.0–34.0)
MCHC: 29.4 g/dL — AB (ref 30.0–36.0)
MCV: 77.7 fL — AB (ref 80.0–100.0)
MONOS PCT: 12 %
Monocytes Absolute: 0.7 10*3/uL (ref 0.1–1.0)
NEUTROS PCT: 55 %
NRBC: 0 % (ref 0.0–0.2)
Neutro Abs: 3.4 10*3/uL (ref 1.7–7.7)
PLATELETS: 334 10*3/uL (ref 150–400)
RBC: 4.43 MIL/uL (ref 3.87–5.11)
RDW: 16.4 % — AB (ref 11.5–15.5)
WBC: 6.2 10*3/uL (ref 4.0–10.5)

## 2018-10-07 LAB — IRON AND TIBC
Iron: 22 ug/dL — ABNORMAL LOW (ref 28–170)
SATURATION RATIOS: 5 % — AB (ref 10.4–31.8)
TIBC: 429 ug/dL (ref 250–450)
UIBC: 407 ug/dL

## 2018-10-07 LAB — FERRITIN: FERRITIN: 7 ng/mL — AB (ref 11–307)

## 2018-10-07 MED ORDER — SODIUM CHLORIDE 0.9 % IV SOLN
Freq: Once | INTRAVENOUS | Status: AC
  Administered 2018-10-07: 12:00:00 via INTRAVENOUS
  Filled 2018-10-07: qty 250

## 2018-10-07 MED ORDER — SODIUM CHLORIDE 0.9 % IV SOLN
510.0000 mg | Freq: Once | INTRAVENOUS | Status: AC
  Administered 2018-10-07: 510 mg via INTRAVENOUS
  Filled 2018-10-07: qty 17

## 2018-10-07 NOTE — Progress Notes (Signed)
Patient here for follow up. She has been tired, short of breath with exertion, her scalp is "sweaty" and she is cold all the time.

## 2018-10-11 ENCOUNTER — Ambulatory Visit (INDEPENDENT_AMBULATORY_CARE_PROVIDER_SITE_OTHER): Payer: PPO | Admitting: Internal Medicine

## 2018-10-11 ENCOUNTER — Encounter: Payer: Self-pay | Admitting: Internal Medicine

## 2018-10-11 VITALS — BP 138/80 | HR 80 | Temp 98.0°F | Ht 66.25 in | Wt 239.0 lb

## 2018-10-11 DIAGNOSIS — Z7189 Other specified counseling: Secondary | ICD-10-CM

## 2018-10-11 DIAGNOSIS — K219 Gastro-esophageal reflux disease without esophagitis: Secondary | ICD-10-CM | POA: Diagnosis not present

## 2018-10-11 DIAGNOSIS — D509 Iron deficiency anemia, unspecified: Secondary | ICD-10-CM

## 2018-10-11 DIAGNOSIS — F39 Unspecified mood [affective] disorder: Secondary | ICD-10-CM

## 2018-10-11 DIAGNOSIS — Z0000 Encounter for general adult medical examination without abnormal findings: Secondary | ICD-10-CM

## 2018-10-11 DIAGNOSIS — M48062 Spinal stenosis, lumbar region with neurogenic claudication: Secondary | ICD-10-CM | POA: Diagnosis not present

## 2018-10-11 NOTE — Assessment & Plan Note (Signed)
Fine with the daily PPI

## 2018-10-11 NOTE — Assessment & Plan Note (Signed)
She is really frustrated by this May want second opinion
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
## 2018-10-11 NOTE — Assessment & Plan Note (Signed)
Doing some better with the etodolac daily

## 2018-10-11 NOTE — Progress Notes (Signed)
Hearing Screening   Method: Audiometry   125Hz  250Hz  500Hz  1000Hz  2000Hz  3000Hz  4000Hz  6000Hz  8000Hz   Right ear:   20 20 20  20     Left ear:   20 20 20  20     Vision Screening Comments: Appt in November 2019

## 2018-10-11 NOTE — Assessment & Plan Note (Signed)
Lots of anger, anxiety, episodic depression Seeing counselor---will let me know if they decide Rx is needed

## 2018-10-11 NOTE — Assessment & Plan Note (Signed)
I have personally reviewed the Medicare Annual Wellness questionnaire and have noted 1. The patient's medical and social history 2. Their use of alcohol, tobacco or illicit drugs 3. Their current medications and supplements 4. The patient's functional ability including ADL's, fall risks, home safety risks and hearing or visual             impairment. 5. Diet and physical activities 6. Evidence for depression or mood disorders  The patients weight, height, BMI and visual acuity have been recorded in the chart I have made referrals, counseling and provided education to the patient based review of the above and I have provided the pt with a written personalized care plan for preventive services.  I have provided you with a copy of your personalized plan for preventive services. Please take the time to review along with your updated medication list.  Just had benign colon Due for mammogram Not able to exercise Consider shingrix

## 2018-10-11 NOTE — Assessment & Plan Note (Signed)
Copy given to me today

## 2018-10-11 NOTE — Assessment & Plan Note (Signed)
BMI 38 with GERD, chronic back pain, venous insufficiency Doing better with eating---back limits her activity

## 2018-10-12 ENCOUNTER — Other Ambulatory Visit: Payer: Self-pay | Admitting: Oncology

## 2018-10-12 DIAGNOSIS — D649 Anemia, unspecified: Secondary | ICD-10-CM

## 2018-10-13 ENCOUNTER — Inpatient Hospital Stay: Payer: PPO

## 2018-10-13 VITALS — BP 140/73 | HR 79 | Temp 98.0°F | Resp 18

## 2018-10-13 DIAGNOSIS — D509 Iron deficiency anemia, unspecified: Secondary | ICD-10-CM | POA: Diagnosis not present

## 2018-10-13 DIAGNOSIS — D500 Iron deficiency anemia secondary to blood loss (chronic): Secondary | ICD-10-CM

## 2018-10-13 MED ORDER — SODIUM CHLORIDE 0.9 % IV SOLN
Freq: Once | INTRAVENOUS | Status: AC
  Administered 2018-10-13: 14:00:00 via INTRAVENOUS
  Filled 2018-10-13: qty 250

## 2018-10-13 MED ORDER — SODIUM CHLORIDE 0.9 % IV SOLN
510.0000 mg | Freq: Once | INTRAVENOUS | Status: AC
  Administered 2018-10-13: 510 mg via INTRAVENOUS
  Filled 2018-10-13: qty 17

## 2018-10-14 ENCOUNTER — Ambulatory Visit: Payer: PPO | Admitting: Psychology

## 2018-10-18 ENCOUNTER — Ambulatory Visit (INDEPENDENT_AMBULATORY_CARE_PROVIDER_SITE_OTHER): Payer: PPO | Admitting: Psychology

## 2018-10-18 DIAGNOSIS — F4323 Adjustment disorder with mixed anxiety and depressed mood: Secondary | ICD-10-CM | POA: Diagnosis not present

## 2018-10-25 ENCOUNTER — Ambulatory Visit (INDEPENDENT_AMBULATORY_CARE_PROVIDER_SITE_OTHER): Payer: PPO | Admitting: Psychology

## 2018-10-25 DIAGNOSIS — F4323 Adjustment disorder with mixed anxiety and depressed mood: Secondary | ICD-10-CM | POA: Diagnosis not present

## 2018-11-02 ENCOUNTER — Ambulatory Visit: Payer: PPO | Admitting: Psychology

## 2018-12-29 ENCOUNTER — Other Ambulatory Visit: Payer: Self-pay | Admitting: Internal Medicine

## 2019-01-09 NOTE — Progress Notes (Signed)
[place]  Telephone:(336) [phone] [phone]) [phone]  ID: Alice Donaldson OB: 11/08/1946  MR#: [id number]  VFI#:[id number]  Patient Care Team: [name], MD as PCP - General (Pediatrics)  CHIEF COMPLAINT: Iron deficiency anemia.  INTERVAL HISTORY: Patient returns to clinic today for repeat laboratory work, further evaluation, and consideration of additional IV Feraheme.  She continues to have multiple medical complaints that are unrelated to her iron deficiency anemia.  She complains of a recent "throbbing" headache.  She has no other neurologic complaints.  She denies any recent fevers or illnesses. She has a good appetite and denies weight loss.  She denies any chest pain or shortness of breath.  She denies any nausea, vomiting, constipation, or diarrhea. She denies any melena or hematochezia. She has no urinary complaints.  Patient offers no further specific complaints today.  REVIEW OF SYSTEMS:   Review of Systems  Constitutional: Positive for malaise/fatigue. Negative for fever and weight loss.  Respiratory: Negative.  Negative for cough, hemoptysis and shortness of breath.   Cardiovascular: Negative.  Negative for chest pain and leg swelling.  Gastrointestinal: Negative.  Negative for abdominal pain, blood in stool and melena.  Genitourinary: Negative.  Negative for hematuria.  Musculoskeletal: Negative.  Negative for back pain.  Skin: Negative.  Negative for rash.  Neurological: Positive for weakness and headaches. Negative for sensory change and focal weakness.  Psychiatric/Behavioral: Negative.  The patient is not nervous/anxious.     As per HPI. Otherwise, a complete review of systems is negative.  PAST MEDICAL HISTORY: Past Medical History:  Diagnosis Date  . Allergic rhinitis, cause unspecified   . Allergy   . Anemia   . Complication of anesthesia   . Diverticulosis   . DVT (deep venous thrombosis) ([place])   . Gastric ulcer    in past  .  GERD (gastroesophageal reflux disease)   . Hemorrhoids, internal   . Hyperlipidemia   . Osteoarthrosis involving, or with mention of more than one site, but not specified as generalized, multiple sites   . PONV (postoperative nausea and vomiting)   . Ulcer (traumatic) of oral mucosa   . Unspecified venous (peripheral) insufficiency     PAST SURGICAL HISTORY: Past Surgical History:  Procedure Laterality Date  . ABDOMINAL HYSTERECTOMY  1996  . BREAST BIOPSY  2003  . CHOLECYSTECTOMY  2000  . COLONOSCOPY WITH PROPOFOL N/A 10/02/2015   Procedure: COLONOSCOPY WITH PROPOFOL;  Surgeon: Lollie Sails, MD;  Location: [facility] ENDOSCOPY;  Service: Endoscopy;  Laterality: N/A;  . COLONOSCOPY WITH PROPOFOL N/A 06/17/2018   Procedure: COLONOSCOPY WITH PROPOFOL;  Surgeon: Toledo, Benay Pike, MD;  Location: [facility] ENDOSCOPY;  Service: Gastroenterology;  Laterality: N/A;  . DEXA  5/14   Normal  . ESOPHAGOGASTRODUODENOSCOPY (EGD) WITH PROPOFOL N/A 06/17/2018   Procedure: ESOPHAGOGASTRODUODENOSCOPY (EGD) WITH PROPOFOL;  Surgeon: Toledo, Benay Pike, MD;  Location: [facility] ENDOSCOPY;  Service: Gastroenterology;  Laterality: N/A;  . KNEE ARTHROSCOPY     right in 2007, left in 2011  . TONSILLECTOMY AND ADENOIDECTOMY  childhood    FAMILY HISTORY: Family History  Problem Relation Age of Onset  . Heart disease Mother   . Heart disease Father   . Diabetes Son 5       Type 1  . Hypertension Son   . Stroke Brother   . Diabetes Other 2       type 1  . Hyperlipidemia Neg Hx   . Depression Neg Hx     ADVANCED  DIRECTIVES (Y/N):  N  HEALTH MAINTENANCE: Social History   Tobacco Use  . Smoking status: Former Smoker    Types: Cigarettes  . Smokeless tobacco: Never Used  Substance Use Topics  . Alcohol use: No    Comment: rare glass of wine  . Drug use: No     Colonoscopy:  PAP:  Bone density:  Lipid panel:  Allergies  Allergen Reactions  . Prevpac [Amoxicill-Clarithro-Lansopraz] Other (See Comments)     Throat closed up  . Latex Rash  . Duloxetine Other (See Comments)    Nausea, diarrhea and jittery  . Tramadol     GI symptoms    Current Outpatient Medications  Medication Sig Dispense Refill  . acetaminophen (TYLENOL) 500 MG tablet Take 1,000 mg by mouth 3 (three) times daily.    [name] albuterol (PROAIR HFA) 108 (90 Base) MCG/ACT inhaler INHALE 2 PUFFS INTO THE LUNGS EVERY 6 (SIX) HOURS AS NEEDED FOR WHEEZING. (Patient taking differently: Inhale 2 puffs into the lungs every 6 (six) hours as needed for wheezing. ) 8.5 each 1  . cetirizine (ZYRTEC) 10 MG tablet Take 10 mg by mouth at bedtime.     . chlorzoxazone (PARAFON) 500 MG tablet Take 1 tablet (500 mg total) by mouth 3 (three) times daily as needed. (Patient taking differently: Take 500 mg by mouth 3 (three) times daily as needed for muscle spasms. ) 30 tablet 1  . cholecalciferol (VITAMIN D) 1000 units tablet Take 1,000 Units by mouth daily.    [name] docusate sodium (STOOL SOFTENER) 100 MG capsule Take 100 mg by mouth at bedtime.     [name] etodolac (LODINE) 500 MG tablet TAKE 1 TABLET BY MOUTH TWICE A DAY 60 tablet 2  . Glucos-Chondroit-Hyaluron-MSM (GLUCOSAMINE CHONDROITIN JOINT) TABS Take 2 tablets by mouth daily.    [name] LORazepam (ATIVAN) 0.5 MG tablet TAKE 1/2 TO 1 TABLET BY MOUTH AT BEDTIME 30 tablet 0  . nortriptyline (PAMELOR) 10 MG capsule TAKE 1 CAPSULE (10 MG TOTAL) BY MOUTH AT BEDTIME. 90 capsule 0  . pantoprazole (PROTONIX) 40 MG tablet TAKE 1 TABLET (40 MG TOTAL) BY MOUTH 2 (TWO) TIMES DAILY. 60 tablet 11  . polyethylene glycol (MIRALAX / GLYCOLAX) packet Take 17 g by mouth daily as needed for moderate constipation.      No current facility-administered medications for this visit.     OBJECTIVE: Vitals:   01/10/19 1328  BP: (!) 184/102  Pulse: 83  Temp: (!) 97.5 F (36.4 C)     Body mass index is 38.62 kg/m.    ECOG FS:0 - Asymptomatic  General: Well-developed, well-nourished, no acute distress. Eyes: Pink conjunctiva,  anicteric sclera. HEENT: Normocephalic, moist mucous membranes. Lungs: Clear to auscultation bilaterally. Heart: Regular rate and rhythm. No rubs, murmurs, or gallops. Abdomen: Soft, nontender, nondistended. No organomegaly noted, normoactive bowel sounds. Musculoskeletal: No edema, cyanosis, or clubbing. Neuro: Alert, answering all questions appropriately. Cranial nerves grossly intact. Skin: No rashes or petechiae noted. Psych: Normal affect.  LAB RESULTS:  Lab Results  Component Value Date   NA 140 06/16/2018   K 4.2 06/16/2018   CL 113 (H) 06/16/2018   CO2 22 06/16/2018   GLUCOSE 103 (H) 06/16/2018   BUN 22 06/16/2018   CREATININE 0.78 06/16/2018   CALCIUM 8.8 (L) 06/16/2018   PROT 7.1 06/15/2018   ALBUMIN 3.6 06/15/2018   AST 18 06/15/2018   ALT 15 06/15/2018   ALKPHOS 108 06/15/2018   BILITOT 0.5 06/15/2018   GFRNONAA >60 06/16/2018  GFRAA >60 06/16/2018    Lab Results  Component Value Date   WBC 5.7 01/10/2019   NEUTROABS 3.3 01/10/2019   HGB 14.0 01/10/2019   HCT 45.3 01/10/2019   MCV 91.9 01/10/2019   PLT 145 (L) 01/10/2019   Lab Results  Component Value Date   IRON 45 01/10/2019   TIBC 383 01/10/2019   IRONPCTSAT 12 01/10/2019   Lab Results  Component Value Date   FERRITIN 11 01/10/2019     STUDIES: No results found.  ASSESSMENT: Iron deficiency anemia.  PLAN:    1.  Iron deficiency anemia: Patient's hemoglobin and iron stores are now within normal limits.  She does not require additional IV Feraheme today.  EGD and colonoscopy on June 17, 2018 did not reveal specific source of GI bleed, but did note chronic gastritis.  She last received treatment on October 13, 2018.  No intervention is needed.  Return to clinic in 3 months with repeat laboratory work, further evaluation, and consideration of additional IV Feraheme. 2.  Thrombocytopenia: Mild, monitor. 3.  History of multiple myeloma: Patient reports her brother was just diagnosed with multiple  myeloma and is currently receiving chemotherapy.  Currently, there is nothing in her laboratory work that suggests she has myeloma.  Can consider SPEP in the future, but this is not necessary at this point.   4.  Headaches: Possibly related to patient's hypertension.  Results were forwarded to patient's primary care for further evaluation.  I spent a total of 30 minutes face-to-face with the patient of which greater than 50% of the visit was spent in counseling and coordination of care as detailed above.   Patient expressed understanding and was in agreement with this plan. She also understands that She can call clinic at any time with any questions, concerns, or complaints.    Lloyd Huger, MD   01/11/2019 10:53 AM

## 2019-01-10 ENCOUNTER — Inpatient Hospital Stay: Payer: PPO

## 2019-01-10 ENCOUNTER — Other Ambulatory Visit: Payer: Self-pay

## 2019-01-10 ENCOUNTER — Inpatient Hospital Stay: Payer: PPO | Attending: Oncology | Admitting: Oncology

## 2019-01-10 VITALS — BP 184/102 | HR 83 | Temp 97.5°F | Wt 241.1 lb

## 2019-01-10 DIAGNOSIS — R51 Headache: Secondary | ICD-10-CM | POA: Diagnosis not present

## 2019-01-10 DIAGNOSIS — D696 Thrombocytopenia, unspecified: Secondary | ICD-10-CM | POA: Insufficient documentation

## 2019-01-10 DIAGNOSIS — Z87891 Personal history of nicotine dependence: Secondary | ICD-10-CM | POA: Diagnosis not present

## 2019-01-10 DIAGNOSIS — D500 Iron deficiency anemia secondary to blood loss (chronic): Secondary | ICD-10-CM

## 2019-01-10 DIAGNOSIS — D509 Iron deficiency anemia, unspecified: Secondary | ICD-10-CM | POA: Diagnosis not present

## 2019-01-10 DIAGNOSIS — D649 Anemia, unspecified: Secondary | ICD-10-CM

## 2019-01-10 DIAGNOSIS — Z807 Family history of other malignant neoplasms of lymphoid, hematopoietic and related tissues: Secondary | ICD-10-CM | POA: Diagnosis not present

## 2019-01-10 LAB — CBC WITH DIFFERENTIAL/PLATELET
Abs Immature Granulocytes: 0.03 10*3/uL (ref 0.00–0.07)
Basophils Absolute: 0.1 10*3/uL (ref 0.0–0.1)
Basophils Relative: 1 %
EOS PCT: 4 %
Eosinophils Absolute: 0.2 10*3/uL (ref 0.0–0.5)
HCT: 45.3 % (ref 36.0–46.0)
Hemoglobin: 14 g/dL (ref 12.0–15.0)
Immature Granulocytes: 1 %
LYMPHS PCT: 27 %
Lymphs Abs: 1.5 10*3/uL (ref 0.7–4.0)
MCH: 28.4 pg (ref 26.0–34.0)
MCHC: 30.9 g/dL (ref 30.0–36.0)
MCV: 91.9 fL (ref 80.0–100.0)
MONO ABS: 0.6 10*3/uL (ref 0.1–1.0)
MONOS PCT: 10 %
Neutro Abs: 3.3 10*3/uL (ref 1.7–7.7)
Neutrophils Relative %: 57 %
Platelets: 145 10*3/uL — ABNORMAL LOW (ref 150–400)
RBC: 4.93 MIL/uL (ref 3.87–5.11)
RDW: 15.6 % — ABNORMAL HIGH (ref 11.5–15.5)
WBC: 5.7 10*3/uL (ref 4.0–10.5)
nRBC: 0 % (ref 0.0–0.2)

## 2019-01-10 LAB — IRON AND TIBC
IRON: 45 ug/dL (ref 28–170)
SATURATION RATIOS: 12 % (ref 10.4–31.8)
TIBC: 383 ug/dL (ref 250–450)
UIBC: 338 ug/dL

## 2019-01-10 LAB — FERRITIN: FERRITIN: 11 ng/mL (ref 11–307)

## 2019-01-10 NOTE — Progress Notes (Signed)
Patient is here today to follow up iron deficiency, anemia. Patient stated that she had been having throbbing pain on her head twice now. Patient also wanted to know if there could be any way that she gets a second opinion to see if she had multiple myeloma since her brother had been diagnosed and is currently on chemotherapy.

## 2019-01-11 LAB — IGG, IGA, IGM
IGA: 180 mg/dL (ref 64–422)
IGM (IMMUNOGLOBULIN M), SRM: 127 mg/dL (ref 26–217)
IgG (Immunoglobin G), Serum: 1084 mg/dL (ref 700–1600)

## 2019-01-11 LAB — PROTEIN ELECTROPHORESIS, SERUM
A/G RATIO SPE: 1.2 (ref 0.7–1.7)
ALBUMIN ELP: 3.7 g/dL (ref 2.9–4.4)
ALPHA-1-GLOBULIN: 0.2 g/dL (ref 0.0–0.4)
Alpha-2-Globulin: 0.7 g/dL (ref 0.4–1.0)
Beta Globulin: 1.1 g/dL (ref 0.7–1.3)
Gamma Globulin: 1 g/dL (ref 0.4–1.8)
Globulin, Total: 3 g/dL (ref 2.2–3.9)
TOTAL PROTEIN ELP: 6.7 g/dL (ref 6.0–8.5)

## 2019-01-11 LAB — KAPPA/LAMBDA LIGHT CHAINS
KAPPA, LAMDA LIGHT CHAIN RATIO: 1.61 (ref 0.26–1.65)
Kappa free light chain: 21.2 mg/L — ABNORMAL HIGH (ref 3.3–19.4)
LAMDA FREE LIGHT CHAINS: 13.2 mg/L (ref 5.7–26.3)

## 2019-01-25 DIAGNOSIS — Z1231 Encounter for screening mammogram for malignant neoplasm of breast: Secondary | ICD-10-CM | POA: Diagnosis not present

## 2019-01-25 LAB — HM MAMMOGRAPHY

## 2019-01-26 ENCOUNTER — Encounter: Payer: Self-pay | Admitting: Internal Medicine

## 2019-03-27 ENCOUNTER — Other Ambulatory Visit: Payer: Self-pay | Admitting: Internal Medicine

## 2019-04-03 ENCOUNTER — Other Ambulatory Visit: Payer: Self-pay | Admitting: Internal Medicine

## 2019-04-12 ENCOUNTER — Ambulatory Visit: Payer: Self-pay | Admitting: Oncology

## 2019-04-12 ENCOUNTER — Other Ambulatory Visit: Payer: Self-pay

## 2019-04-12 ENCOUNTER — Ambulatory Visit: Payer: Self-pay

## 2019-05-24 ENCOUNTER — Other Ambulatory Visit: Payer: Self-pay | Admitting: Internal Medicine

## 2019-05-24 NOTE — Telephone Encounter (Signed)
Last filled 07-26-18 #30 Last OV 10-11-18 Next OV 10-18-19 [facility]

## 2019-06-09 NOTE — Telephone Encounter (Signed)
Should be in office

## 2019-06-10 ENCOUNTER — Encounter: Payer: Self-pay | Admitting: Oncology

## 2019-06-10 ENCOUNTER — Other Ambulatory Visit: Payer: Self-pay

## 2019-06-10 ENCOUNTER — Inpatient Hospital Stay: Payer: PPO | Attending: Oncology

## 2019-06-10 ENCOUNTER — Telehealth: Payer: Self-pay

## 2019-06-10 DIAGNOSIS — D509 Iron deficiency anemia, unspecified: Secondary | ICD-10-CM | POA: Insufficient documentation

## 2019-06-10 LAB — CBC WITH DIFFERENTIAL/PLATELET
Abs Immature Granulocytes: 0.06 10*3/uL (ref 0.00–0.07)
Basophils Absolute: 0.1 10*3/uL (ref 0.0–0.1)
Basophils Relative: 1 %
Eosinophils Absolute: 0.2 10*3/uL (ref 0.0–0.5)
Eosinophils Relative: 2 %
HCT: 26.9 % — ABNORMAL LOW (ref 36.0–46.0)
Hemoglobin: 8.3 g/dL — ABNORMAL LOW (ref 12.0–15.0)
Immature Granulocytes: 1 %
Lymphocytes Relative: 26 %
Lymphs Abs: 1.8 10*3/uL (ref 0.7–4.0)
MCH: 25.7 pg — ABNORMAL LOW (ref 26.0–34.0)
MCHC: 30.9 g/dL (ref 30.0–36.0)
MCV: 83.3 fL (ref 80.0–100.0)
Monocytes Absolute: 0.8 10*3/uL (ref 0.1–1.0)
Monocytes Relative: 11 %
Neutro Abs: 4 10*3/uL (ref 1.7–7.7)
Neutrophils Relative %: 59 %
Platelets: 324 10*3/uL (ref 150–400)
RBC: 3.23 MIL/uL — ABNORMAL LOW (ref 3.87–5.11)
RDW: 14.5 % (ref 11.5–15.5)
WBC: 6.8 10*3/uL (ref 4.0–10.5)
nRBC: 0 % (ref 0.0–0.2)

## 2019-06-10 LAB — IRON AND TIBC
Iron: 13 ug/dL — ABNORMAL LOW (ref 28–170)
Saturation Ratios: 3 % — ABNORMAL LOW (ref 10.4–31.8)
TIBC: 421 ug/dL (ref 250–450)
UIBC: 408 ug/dL

## 2019-06-10 LAB — FERRITIN: Ferritin: 5 ng/mL — ABNORMAL LOW (ref 11–307)

## 2019-06-10 NOTE — Telephone Encounter (Signed)
Patient sent [place] an e-mail by MyChart. I called her to respond to her message and what recommendations wee. I asked her what type of symptoms she was having. Patient stated that she had been feeling very weak and tired. Patient also stated that she feels her heart and head pounding for several days. Patient stated that she wants to come in and have blood work and an infusion. I told her that I would have to speak with Dr. Grayland Ormond and then I would have to call her.  I then called patient back to let her know that she is to come in at 11:45 AM for blood drawn and then I would call her with results and if she needed an infusion, then I would call her and let know when she could come in next week. Patient was also recommended to go to the ED if she was not feeling well and not be able to wait until next week. Patient agreed and will await on my call later on once I had her lab results.

## 2019-06-12 NOTE — Progress Notes (Signed)
[facility]  [phone] [phone]  ID: Alice Donaldson OB: 03/09/1946  MR#: [id number]  CSN#:[id number]  Patient Care Team: Letvak, Richard I, MD as PCP - General (Pediatrics)  CHIEF COMPLAINT: Iron deficiency anemia.  INTERVAL HISTORY: Patient returns to clinic today as an add-on after complaints of significant weakness and fatigue and noted to have a significantly reduced hemoglobin and iron stores.  Patient states her symptoms started several months ago, and have become progressively worse.  She has no neurologic complaints.  She denies any recent fevers or illnesses. She has a good appetite and denies weight loss.  She denies any chest pain, shortness of breath, cough, or hemoptysis.  She denies any nausea, vomiting, constipation, or diarrhea. She denies any melena or hematochezia. She has no urinary complaints.  Patient offers no further specific complaints today.  REVIEW OF SYSTEMS:   Review of Systems  Constitutional: Positive for malaise/fatigue. Negative for fever and weight loss.  Respiratory: Negative.  Negative for cough, hemoptysis and shortness of breath.   Cardiovascular: Negative.  Negative for chest pain and leg swelling.  Gastrointestinal: Negative.  Negative for abdominal pain, blood in stool and melena.  Genitourinary: Negative.  Negative for hematuria.  Musculoskeletal: Negative.  Negative for back pain.  Skin: Negative.  Negative for rash.  Neurological: Positive for weakness. Negative for sensory change, focal weakness and headaches.  Psychiatric/Behavioral: Negative.  The patient is not nervous/anxious.     As per HPI. Otherwise, a complete review of systems is negative.  PAST MEDICAL HISTORY: Past Medical History:  Diagnosis Date  . Allergic rhinitis, cause unspecified   . Allergy   . Anemia   . Complication of anesthesia   . Diverticulosis   . DVT (deep venous thrombosis) ([facility])   . Gastric ulcer    in past  .  GERD (gastroesophageal reflux disease)   . Hemorrhoids, internal   . Hyperlipidemia   . Osteoarthrosis involving, or with mention of more than one site, but not specified as generalized, multiple sites   . PONV (postoperative nausea and vomiting)   . Ulcer (traumatic) of oral mucosa   . Unspecified venous (peripheral) insufficiency     PAST SURGICAL HISTORY: Past Surgical History:  Procedure Laterality Date  . ABDOMINAL HYSTERECTOMY  1996  . BREAST BIOPSY  2003  . CHOLECYSTECTOMY  2000  . COLONOSCOPY WITH PROPOFOL N/A 10/02/2015   Procedure: COLONOSCOPY WITH PROPOFOL;  Surgeon: Martin U Skulskie, MD;  Location: [facility] ENDOSCOPY;  Service: Endoscopy;  Laterality: N/A;  . COLONOSCOPY WITH PROPOFOL N/A 06/17/2018   Procedure: COLONOSCOPY WITH PROPOFOL;  Surgeon: Toledo, Teodoro K, MD;  Location: [facility] ENDOSCOPY;  Service: Gastroenterology;  Laterality: N/A;  . DEXA  5/14   Normal  . ESOPHAGOGASTRODUODENOSCOPY (EGD) WITH PROPOFOL N/A 06/17/2018   Procedure: ESOPHAGOGASTRODUODENOSCOPY (EGD) WITH PROPOFOL;  Surgeon: Toledo, Teodoro K, MD;  Location: [facility] ENDOSCOPY;  Service: Gastroenterology;  Laterality: N/A;  . KNEE ARTHROSCOPY     right in 2007, left in 2011  . TONSILLECTOMY AND ADENOIDECTOMY  childhood    FAMILY HISTORY: Family History  Problem Relation Age of Onset  . Heart disease Mother   . Heart disease Father   . Diabetes Son 5       Type 1  . Hypertension Son   . Stroke Brother   . Diabetes Other 2       type 1  . Hyperlipidemia Neg Hx   . Depression Neg Hx     ADVANCED DIRECTIVES (  Y/N):  N  HEALTH MAINTENANCE: Social History   Tobacco Use  . Smoking status: Former Smoker    Types: Cigarettes  . Smokeless tobacco: Never Used  Substance Use Topics  . Alcohol use: No    Comment: rare glass of wine  . Drug use: No     Colonoscopy:  PAP:  Bone density:  Lipid panel:  Allergies  Allergen Reactions  . Prevpac [Amoxicill-Clarithro-Lansopraz] Other (See Comments)     Throat closed up  . Latex Rash  . Duloxetine Other (See Comments)    Nausea, diarrhea and jittery  . Tramadol     GI symptoms    Current Outpatient Medications  Medication Sig Dispense Refill  . acetaminophen (TYLENOL) 500 MG tablet Take 1,000 mg by mouth 3 (three) times daily.    . albuterol (PROAIR HFA) 108 (90 Base) MCG/ACT inhaler INHALE 2 PUFFS INTO THE LUNGS EVERY 6 (SIX) HOURS AS NEEDED FOR WHEEZING. (Patient taking differently: Inhale 2 puffs into the lungs every 6 (six) hours as needed for wheezing. ) 8.5 each 1  . cetirizine (ZYRTEC) 10 MG tablet Take 10 mg by mouth at bedtime.     . chlorzoxazone (PARAFON) 500 MG tablet Take 1 tablet (500 mg total) by mouth 3 (three) times daily as needed. (Patient taking differently: Take 500 mg by mouth 3 (three) times daily as needed for muscle spasms. ) 30 tablet 1  . cholecalciferol (VITAMIN D) 1000 units tablet Take 1,000 Units by mouth daily.    . docusate sodium (STOOL SOFTENER) 100 MG capsule Take 100 mg by mouth at bedtime.     . etodolac (LODINE) 500 MG tablet TAKE 1 TABLET BY MOUTH TWICE A DAY 60 tablet 2  . Glucos-Chondroit-Hyaluron-MSM (GLUCOSAMINE CHONDROITIN JOINT) TABS Take 2 tablets by mouth daily.    . LORazepam (ATIVAN) 0.5 MG tablet TAKE 1/2-1 TABLET BY MOUTH AT BEDTIME 30 tablet 0  . nortriptyline (PAMELOR) 10 MG capsule TAKE 1 CAPSULE (10 MG TOTAL) BY MOUTH AT BEDTIME. 90 capsule 0  . pantoprazole (PROTONIX) 40 MG tablet TAKE 1 TABLET (40 MG TOTAL) BY MOUTH 2 (TWO) TIMES DAILY. 180 tablet 2  . polyethylene glycol (MIRALAX / GLYCOLAX) packet Take 17 g by mouth daily as needed for moderate constipation.      No current facility-administered medications for this visit.    Facility-Administered Medications Ordered in Other Visits  Medication Dose Route Frequency Provider Last Rate Last Dose  . 0.9 %  sodium chloride infusion   Intravenous Continuous Finnegan, Timothy J, MD 10 mL/hr at 06/13/19 1455    . ferumoxytol  (FERAHEME) 510 mg in sodium chloride 0.9 % 100 mL IVPB  510 mg Intravenous Once Finnegan, Timothy J, MD 468 mL/hr at 06/13/19 1459 510 mg at 06/13/19 1459    OBJECTIVE: Vitals:   06/13/19 1327  BP: 110/82  Resp: 18     There is no height or weight on file to calculate BMI.    ECOG FS:1 - Symptomatic but completely ambulatory  General: Well-developed, well-nourished, no acute distress. Eyes: Pink conjunctiva, anicteric sclera. HEENT: Normocephalic, moist mucous membranes. Lungs: Clear to auscultation bilaterally. Heart: Regular rate and rhythm. No rubs, murmurs, or gallops. Abdomen: Soft, nontender, nondistended. No organomegaly noted, normoactive bowel sounds. Musculoskeletal: No edema, cyanosis, or clubbing. Neuro: Alert, answering all questions appropriately. Cranial nerves grossly intact. Skin: No rashes or petechiae noted. Psych: Normal affect.  LAB RESULTS:  Lab Results  Component Value Date   NA 140 06/16/2018     K 4.2 06/16/2018   CL 113 (H) 06/16/2018   CO2 22 06/16/2018   GLUCOSE 103 (H) 06/16/2018   BUN 22 06/16/2018   CREATININE 0.78 06/16/2018   CALCIUM 8.8 (L) 06/16/2018   PROT 7.1 06/15/2018   ALBUMIN 3.6 06/15/2018   AST 18 06/15/2018   ALT 15 06/15/2018   ALKPHOS 108 06/15/2018   BILITOT 0.5 06/15/2018   GFRNONAA >60 06/16/2018   GFRAA >60 06/16/2018    Lab Results  Component Value Date   WBC 6.8 06/10/2019   NEUTROABS 4.0 06/10/2019   HGB 8.3 (L) 06/10/2019   HCT 26.9 (L) 06/10/2019   MCV 83.3 06/10/2019   PLT 324 06/10/2019   Lab Results  Component Value Date   IRON 13 (L) 06/10/2019   TIBC 421 06/10/2019   IRONPCTSAT 3 (L) 06/10/2019   Lab Results  Component Value Date   FERRITIN 5 (L) 06/10/2019     STUDIES: No results found.  ASSESSMENT: Iron deficiency anemia.  PLAN:    1.  Iron deficiency anemia: Patient's hemoglobin and iron stores have significantly trended down and she is symptomatic.  Her most recent EGD and colonoscopy  on June 17, 2018 did not reveal specific source of GI bleed, but did note chronic gastritis.  Proceed with 510 mg IV Feraheme today.  Return to clinic in 1 week for second infusion and then in 2 months for repeat laboratory work and further evaluation. 2.  Thrombocytopenia: Resolved.   3.  Family history of multiple myeloma: Patient reports her brother was just diagnosed with multiple myeloma.  Currently, there is nothing in her laboratory work that suggests she has myeloma.  Can consider SPEP in the future, but this is not necessary at this point.   4.  Headaches: Patient does not complain of this today.  I spent a total of 30 minutes face-to-face with the patient of which greater than 50% of the visit was spent in counseling and coordination of care as detailed above.   Patient expressed understanding and was in agreement with this plan. She also understands that She can call clinic at any time with any questions, concerns, or complaints.    Lloyd Huger, MD   06/13/2019 3:09 PM

## 2019-06-13 ENCOUNTER — Other Ambulatory Visit: Payer: Self-pay

## 2019-06-13 ENCOUNTER — Ambulatory Visit: Payer: PPO | Admitting: Oncology

## 2019-06-13 ENCOUNTER — Inpatient Hospital Stay (HOSPITAL_BASED_OUTPATIENT_CLINIC_OR_DEPARTMENT_OTHER): Payer: PPO | Admitting: Oncology

## 2019-06-13 ENCOUNTER — Ambulatory Visit: Payer: PPO

## 2019-06-13 ENCOUNTER — Encounter: Payer: Self-pay | Admitting: Oncology

## 2019-06-13 ENCOUNTER — Inpatient Hospital Stay: Payer: PPO

## 2019-06-13 VITALS — BP 118/80 | HR 78 | Resp 20

## 2019-06-13 VITALS — BP 110/82 | Resp 18

## 2019-06-13 DIAGNOSIS — Z87891 Personal history of nicotine dependence: Secondary | ICD-10-CM

## 2019-06-13 DIAGNOSIS — Z807 Family history of other malignant neoplasms of lymphoid, hematopoietic and related tissues: Secondary | ICD-10-CM

## 2019-06-13 DIAGNOSIS — R5383 Other fatigue: Secondary | ICD-10-CM

## 2019-06-13 DIAGNOSIS — D500 Iron deficiency anemia secondary to blood loss (chronic): Secondary | ICD-10-CM

## 2019-06-13 DIAGNOSIS — D509 Iron deficiency anemia, unspecified: Secondary | ICD-10-CM | POA: Diagnosis not present

## 2019-06-13 DIAGNOSIS — R531 Weakness: Secondary | ICD-10-CM | POA: Diagnosis not present

## 2019-06-13 MED ORDER — SODIUM CHLORIDE 0.9 % IV SOLN
INTRAVENOUS | Status: DC
  Administered 2019-06-13: 15:00:00 via INTRAVENOUS
  Filled 2019-06-13: qty 250

## 2019-06-13 MED ORDER — SODIUM CHLORIDE 0.9 % IV SOLN
510.0000 mg | Freq: Once | INTRAVENOUS | Status: AC
  Administered 2019-06-13: 510 mg via INTRAVENOUS
  Filled 2019-06-13: qty 17

## 2019-06-13 NOTE — Progress Notes (Signed)
Patient here today for follow up regarding anemia. Patient reports weakness and extreme fatigue.

## 2019-06-15 NOTE — Telephone Encounter (Signed)
Patient is scheduled to have an infusion on June 20, 2019. Patient was notified.

## 2019-06-16 ENCOUNTER — Other Ambulatory Visit: Payer: Self-pay

## 2019-06-20 ENCOUNTER — Inpatient Hospital Stay: Payer: PPO | Attending: Oncology

## 2019-06-20 ENCOUNTER — Other Ambulatory Visit: Payer: Self-pay

## 2019-06-20 VITALS — BP 144/83 | HR 85 | Resp 20

## 2019-06-20 DIAGNOSIS — D509 Iron deficiency anemia, unspecified: Secondary | ICD-10-CM | POA: Diagnosis not present

## 2019-06-20 DIAGNOSIS — D500 Iron deficiency anemia secondary to blood loss (chronic): Secondary | ICD-10-CM

## 2019-06-20 MED ORDER — SODIUM CHLORIDE 0.9 % IV SOLN
INTRAVENOUS | Status: DC
  Administered 2019-06-20: 14:00:00 via INTRAVENOUS
  Filled 2019-06-20: qty 250

## 2019-06-20 MED ORDER — SODIUM CHLORIDE 0.9 % IV SOLN
510.0000 mg | Freq: Once | INTRAVENOUS | Status: AC
  Administered 2019-06-20: 510 mg via INTRAVENOUS
  Filled 2019-06-20: qty 17

## 2019-07-06 ENCOUNTER — Other Ambulatory Visit: Payer: Self-pay | Admitting: Internal Medicine

## 2019-07-11 ENCOUNTER — Other Ambulatory Visit: Payer: Self-pay

## 2019-07-11 ENCOUNTER — Ambulatory Visit: Payer: Self-pay | Admitting: Oncology

## 2019-07-11 ENCOUNTER — Ambulatory Visit: Payer: Self-pay

## 2019-08-05 NOTE — Progress Notes (Signed)
[place]  Telephone:(336) [phone] Fax:(336) [phone]  ID: Alice Donaldson OB: May 22, 1946  MR#: [id number]  AOZ#:[id number]  Patient Care Team: [name], MD as PCP - General (Pediatrics)  CHIEF COMPLAINT: Iron deficiency anemia.  INTERVAL HISTORY: Patient returns to clinic today for repeat laboratory work, further evaluation, and consideration of additional IV Feraheme.  She continues to complain of chronic weakness and fatigue, but states this is significantly improved since receiving treatment 2 months ago.  She otherwise feels well.  She has no neurologic complaints.  She denies any recent fevers or illnesses. She has a good appetite and denies weight loss.  She denies any chest pain, shortness of breath, cough, or hemoptysis.  She denies any nausea, vomiting, constipation, or diarrhea. She denies any melena or hematochezia. She has no urinary complaints.  Patient offers no further specific complaints today.  REVIEW OF SYSTEMS:   Review of Systems  Constitutional: Positive for malaise/fatigue. Negative for fever and weight loss.  Respiratory: Negative.  Negative for cough, hemoptysis and shortness of breath.   Cardiovascular: Negative.  Negative for chest pain and leg swelling.  Gastrointestinal: Negative.  Negative for abdominal pain, blood in stool and melena.  Genitourinary: Negative.  Negative for hematuria.  Musculoskeletal: Negative.  Negative for back pain.  Skin: Negative.  Negative for rash.  Neurological: Positive for weakness. Negative for sensory change, focal weakness and headaches.  Psychiatric/Behavioral: Negative.  The patient is not nervous/anxious.     As per HPI. Otherwise, a complete review of systems is negative.  PAST MEDICAL HISTORY: Past Medical History:  Diagnosis Date  . Allergic rhinitis, cause unspecified   . Allergy   . Anemia   . Complication of anesthesia   . Diverticulosis   . DVT (deep venous thrombosis) ([place])   .  Gastric ulcer    in past  . GERD (gastroesophageal reflux disease)   . Hemorrhoids, internal   . Hyperlipidemia   . Osteoarthrosis involving, or with mention of more than one site, but not specified as generalized, multiple sites   . PONV (postoperative nausea and vomiting)   . Ulcer (traumatic) of oral mucosa   . Unspecified venous (peripheral) insufficiency     PAST SURGICAL HISTORY: Past Surgical History:  Procedure Laterality Date  . ABDOMINAL HYSTERECTOMY  1996  . BREAST BIOPSY  2003  . CHOLECYSTECTOMY  2000  . COLONOSCOPY WITH PROPOFOL N/A 10/02/2015   Procedure: COLONOSCOPY WITH PROPOFOL;  Surgeon: Lollie Sails, MD;  Location: [facility] ENDOSCOPY;  Service: Endoscopy;  Laterality: N/A;  . COLONOSCOPY WITH PROPOFOL N/A 06/17/2018   Procedure: COLONOSCOPY WITH PROPOFOL;  Surgeon: Toledo, Benay Pike, MD;  Location: [facility] ENDOSCOPY;  Service: Gastroenterology;  Laterality: N/A;  . DEXA  5/14   Normal  . ESOPHAGOGASTRODUODENOSCOPY (EGD) WITH PROPOFOL N/A 06/17/2018   Procedure: ESOPHAGOGASTRODUODENOSCOPY (EGD) WITH PROPOFOL;  Surgeon: Toledo, Benay Pike, MD;  Location: [facility] ENDOSCOPY;  Service: Gastroenterology;  Laterality: N/A;  . KNEE ARTHROSCOPY     right in 2007, left in 2011  . TONSILLECTOMY AND ADENOIDECTOMY  childhood    FAMILY HISTORY: Family History  Problem Relation Age of Onset  . Heart disease Mother   . Heart disease Father   . Diabetes Son 5       Type 1  . Hypertension Son   . Stroke Brother   . Diabetes Other 2       type 1  . Hyperlipidemia Neg Hx   . Depression Neg Hx  ADVANCED DIRECTIVES (Y/N):  N  HEALTH MAINTENANCE: Social History   Tobacco Use  . Smoking status: Former Smoker    Types: Cigarettes  . Smokeless tobacco: Never Used  Substance Use Topics  . Alcohol use: No    Comment: rare glass of wine  . Drug use: No     Colonoscopy:  PAP:  Bone density:  Lipid panel:  Allergies  Allergen Reactions  . Prevpac  [Amoxicill-Clarithro-Lansopraz] Other (See Comments)    Throat closed up  . Latex Rash  . Duloxetine Other (See Comments)    Nausea, diarrhea and jittery  . Tramadol     GI symptoms    Current Outpatient Medications  Medication Sig Dispense Refill  . acetaminophen (TYLENOL) 500 MG tablet Take 1,000 mg by mouth 3 (three) times daily.    [name] albuterol (PROAIR HFA) 108 (90 Base) MCG/ACT inhaler INHALE 2 PUFFS INTO THE LUNGS EVERY 6 (SIX) HOURS AS NEEDED FOR WHEEZING. (Patient taking differently: Inhale 2 puffs into the lungs every 6 (six) hours as needed for wheezing. ) 8.5 each 1  . cetirizine (ZYRTEC) 10 MG tablet Take 10 mg by mouth at bedtime.     . chlorzoxazone (PARAFON) 500 MG tablet Take 1 tablet (500 mg total) by mouth 3 (three) times daily as needed. (Patient taking differently: Take 500 mg by mouth 3 (three) times daily as needed for muscle spasms. ) 30 tablet 1  . cholecalciferol (VITAMIN D) 1000 units tablet Take 1,000 Units by mouth daily.    [name] docusate sodium (STOOL SOFTENER) 100 MG capsule Take 100 mg by mouth at bedtime.     [name] etodolac (LODINE) 500 MG tablet TAKE 1 TABLET BY MOUTH TWICE A DAY 60 tablet 2  . Glucos-Chondroit-Hyaluron-MSM (GLUCOSAMINE CHONDROITIN JOINT) TABS Take 2 tablets by mouth daily.    [name] LORazepam (ATIVAN) 0.5 MG tablet TAKE 1/2-1 TABLET BY MOUTH AT BEDTIME 30 tablet 0  . nortriptyline (PAMELOR) 10 MG capsule TAKE 1 CAPSULE (10 MG TOTAL) BY MOUTH AT BEDTIME. 90 capsule 0  . pantoprazole (PROTONIX) 40 MG tablet TAKE 1 TABLET (40 MG TOTAL) BY MOUTH 2 (TWO) TIMES DAILY. (Patient taking differently: 40 mg daily. ) 180 tablet 2  . polyethylene glycol (MIRALAX / GLYCOLAX) packet Take 17 g by mouth daily as needed for moderate constipation.      No current facility-administered medications for this visit.     OBJECTIVE: Vitals:   08/09/19 1411 08/09/19 1430  BP: (!) 168/94 (!) 175/92  Pulse: 78   Resp: 20      Body mass index is 38.7 kg/m.    ECOG FS:1 -  Symptomatic but completely ambulatory  General: Well-developed, well-nourished, no acute distress. Eyes: Pink conjunctiva, anicteric sclera. HEENT: Normocephalic, moist mucous membranes. Lungs: Clear to auscultation bilaterally. Heart: Regular rate and rhythm. No rubs, murmurs, or gallops. Abdomen: Soft, nontender, nondistended. No organomegaly noted, normoactive bowel sounds. Musculoskeletal: No edema, cyanosis, or clubbing. Neuro: Alert, answering all questions appropriately. Cranial nerves grossly intact. Skin: No rashes or petechiae noted. Psych: Normal affect.  LAB RESULTS:  Lab Results  Component Value Date   NA 140 06/16/2018   K 4.2 06/16/2018   CL 113 (H) 06/16/2018   CO2 22 06/16/2018   GLUCOSE 103 (H) 06/16/2018   BUN 22 06/16/2018   CREATININE 0.78 06/16/2018   CALCIUM 8.8 (L) 06/16/2018   PROT 7.1 06/15/2018   ALBUMIN 3.6 06/15/2018   AST 18 06/15/2018   ALT 15 06/15/2018   ALKPHOS 108  06/15/2018   BILITOT 0.5 06/15/2018   GFRNONAA >60 06/16/2018   GFRAA >60 06/16/2018    Lab Results  Component Value Date   WBC 5.4 08/09/2019   NEUTROABS 2.7 08/09/2019   HGB 12.7 08/09/2019   HCT 40.3 08/09/2019   MCV 87.4 08/09/2019   PLT 308 08/09/2019   Lab Results  Component Value Date   IRON 34 08/09/2019   TIBC 376 08/09/2019   IRONPCTSAT 9 (L) 08/09/2019   Lab Results  Component Value Date   FERRITIN 13 08/09/2019     STUDIES: No results found.  ASSESSMENT: Iron deficiency anemia.  PLAN:    1.  Iron deficiency anemia: Patient's hemoglobin is now within normal limits and her iron stores have significantly improved.  She does not require additional IV Feraheme today.  Her most recent EGD and colonoscopy on June 17, 2018 did not reveal specific source of GI bleed, but did note chronic gastritis.  Return to clinic in 3 months with repeat laboratory work and further evaluation.   2.  Thrombocytopenia: Resolved.   3.  Family history of multiple myeloma:  Patient reports her brother was just diagnosed with multiple myeloma.  Currently, there is nothing in her laboratory work that suggests she has myeloma.  Can consider SPEP in the future, but this is not necessary at this point.    I spent a total of 20 minutes face-to-face with the patient of which greater than 50% of the visit was spent in counseling and coordination of care as detailed above.   Patient expressed understanding and was in agreement with this plan. She also understands that She can call clinic at any time with any questions, concerns, or complaints.    Lloyd Huger, MD   08/10/2019 7:18 AM

## 2019-08-08 ENCOUNTER — Other Ambulatory Visit: Payer: Self-pay

## 2019-08-09 ENCOUNTER — Inpatient Hospital Stay: Payer: PPO

## 2019-08-09 ENCOUNTER — Other Ambulatory Visit: Payer: Self-pay

## 2019-08-09 ENCOUNTER — Inpatient Hospital Stay: Payer: PPO | Attending: Oncology | Admitting: Oncology

## 2019-08-09 ENCOUNTER — Encounter: Payer: Self-pay | Admitting: Oncology

## 2019-08-09 VITALS — BP 175/92 | HR 78 | Resp 20 | Wt 241.6 lb

## 2019-08-09 DIAGNOSIS — D509 Iron deficiency anemia, unspecified: Secondary | ICD-10-CM | POA: Diagnosis not present

## 2019-08-09 DIAGNOSIS — R5382 Chronic fatigue, unspecified: Secondary | ICD-10-CM | POA: Insufficient documentation

## 2019-08-09 DIAGNOSIS — Z807 Family history of other malignant neoplasms of lymphoid, hematopoietic and related tissues: Secondary | ICD-10-CM | POA: Diagnosis not present

## 2019-08-09 DIAGNOSIS — R531 Weakness: Secondary | ICD-10-CM | POA: Insufficient documentation

## 2019-08-09 DIAGNOSIS — Z87891 Personal history of nicotine dependence: Secondary | ICD-10-CM | POA: Diagnosis not present

## 2019-08-09 LAB — CBC WITH DIFFERENTIAL/PLATELET
Abs Immature Granulocytes: 0.02 10*3/uL (ref 0.00–0.07)
Basophils Absolute: 0.1 10*3/uL (ref 0.0–0.1)
Basophils Relative: 1 %
Eosinophils Absolute: 0.3 10*3/uL (ref 0.0–0.5)
Eosinophils Relative: 5 %
HCT: 40.3 % (ref 36.0–46.0)
Hemoglobin: 12.7 g/dL (ref 12.0–15.0)
Immature Granulocytes: 0 %
Lymphocytes Relative: 32 %
Lymphs Abs: 1.7 10*3/uL (ref 0.7–4.0)
MCH: 27.5 pg (ref 26.0–34.0)
MCHC: 31.5 g/dL (ref 30.0–36.0)
MCV: 87.4 fL (ref 80.0–100.0)
Monocytes Absolute: 0.6 10*3/uL (ref 0.1–1.0)
Monocytes Relative: 12 %
Neutro Abs: 2.7 10*3/uL (ref 1.7–7.7)
Neutrophils Relative %: 50 %
Platelets: 308 10*3/uL (ref 150–400)
RBC: 4.61 MIL/uL (ref 3.87–5.11)
RDW: 15.8 % — ABNORMAL HIGH (ref 11.5–15.5)
WBC: 5.4 10*3/uL (ref 4.0–10.5)
nRBC: 0 % (ref 0.0–0.2)

## 2019-08-09 LAB — IRON AND TIBC
Iron: 34 ug/dL (ref 28–170)
Saturation Ratios: 9 % — ABNORMAL LOW (ref 10.4–31.8)
TIBC: 376 ug/dL (ref 250–450)
UIBC: 343 ug/dL

## 2019-08-09 LAB — FERRITIN: Ferritin: 13 ng/mL (ref 11–307)

## 2019-09-09 ENCOUNTER — Ambulatory Visit (INDEPENDENT_AMBULATORY_CARE_PROVIDER_SITE_OTHER): Payer: PPO

## 2019-09-09 ENCOUNTER — Other Ambulatory Visit: Payer: Self-pay

## 2019-09-09 DIAGNOSIS — Z23 Encounter for immunization: Secondary | ICD-10-CM | POA: Diagnosis not present

## 2019-09-15 ENCOUNTER — Ambulatory Visit: Payer: PPO

## 2019-10-17 DIAGNOSIS — G43109 Migraine with aura, not intractable, without status migrainosus: Secondary | ICD-10-CM | POA: Diagnosis not present

## 2019-10-18 ENCOUNTER — Encounter: Payer: Self-pay | Admitting: Internal Medicine

## 2019-10-19 ENCOUNTER — Other Ambulatory Visit: Payer: Self-pay | Admitting: Internal Medicine

## 2019-10-29 ENCOUNTER — Other Ambulatory Visit: Payer: Self-pay | Admitting: Internal Medicine

## 2019-10-31 NOTE — Telephone Encounter (Signed)
Last filled 05-24-19 #30 Last OV 10-11-18 Next OV 02-21-20 [facility]

## 2019-11-08 ENCOUNTER — Other Ambulatory Visit: Payer: Self-pay

## 2019-11-09 NOTE — Progress Notes (Signed)
[place]  [phone] Fax:(336) [phone]  ID: Kaimana C Dibert OB: 11/24/1946  MR#: [id number]  DEY#:[id number]  Patient Care Team: [name], MD as PCP - General (Pediatrics)  CHIEF COMPLAINT: Iron deficiency anemia.  INTERVAL HISTORY: Patient returns to clinic today for repeat laboratory work, further evaluation, and continuation of IV Feraheme.  She has chronic weakness and fatigue, but otherwise feels well.  She has no neurologic complaints.  She denies any recent fevers or illnesses. She has a good appetite and denies weight loss.  She denies any chest pain, shortness of breath, cough, or hemoptysis.  She denies any nausea, vomiting, constipation, or diarrhea. She denies any melena or hematochezia. She has no urinary complaints.  Patient offers no further specific complaints today.  REVIEW OF SYSTEMS:   Review of Systems  Constitutional: Positive for malaise/fatigue. Negative for fever and weight loss.  Respiratory: Negative.  Negative for cough, hemoptysis and shortness of breath.   Cardiovascular: Negative.  Negative for chest pain and leg swelling.  Gastrointestinal: Negative.  Negative for abdominal pain, blood in stool and melena.  Genitourinary: Negative.  Negative for hematuria.  Musculoskeletal: Negative.  Negative for back pain.  Skin: Negative.  Negative for rash.  Neurological: Positive for weakness. Negative for sensory change, focal weakness and headaches.  Psychiatric/Behavioral: Negative.  The patient is not nervous/anxious.     As per HPI. Otherwise, a complete review of systems is negative.  PAST MEDICAL HISTORY: Past Medical History:  Diagnosis Date  . Allergic rhinitis, cause unspecified   . Allergy   . Anemia   . Complication of anesthesia   . Diverticulosis   . DVT (deep venous thrombosis) ([place])   . Gastric ulcer    in past  . GERD (gastroesophageal reflux disease)   . Hemorrhoids, internal   . Hyperlipidemia    . Osteoarthrosis involving, or with mention of more than one site, but not specified as generalized, multiple sites   . PONV (postoperative nausea and vomiting)   . Ulcer (traumatic) of oral mucosa   . Unspecified venous (peripheral) insufficiency     PAST SURGICAL HISTORY: Past Surgical History:  Procedure Laterality Date  . ABDOMINAL HYSTERECTOMY  1996  . BREAST BIOPSY  2003  . CHOLECYSTECTOMY  2000  . COLONOSCOPY WITH PROPOFOL N/A 10/02/2015   Procedure: COLONOSCOPY WITH PROPOFOL;  Surgeon: Lollie Sails, MD;  Location: [facility] ENDOSCOPY;  Service: Endoscopy;  Laterality: N/A;  . COLONOSCOPY WITH PROPOFOL N/A 06/17/2018   Procedure: COLONOSCOPY WITH PROPOFOL;  Surgeon: Toledo, Benay Pike, MD;  Location: [facility] ENDOSCOPY;  Service: Gastroenterology;  Laterality: N/A;  . DEXA  5/14   Normal  . ESOPHAGOGASTRODUODENOSCOPY (EGD) WITH PROPOFOL N/A 06/17/2018   Procedure: ESOPHAGOGASTRODUODENOSCOPY (EGD) WITH PROPOFOL;  Surgeon: Toledo, Benay Pike, MD;  Location: [facility] ENDOSCOPY;  Service: Gastroenterology;  Laterality: N/A;  . KNEE ARTHROSCOPY     right in 2007, left in 2011  . TONSILLECTOMY AND ADENOIDECTOMY  childhood    FAMILY HISTORY: Family History  Problem Relation Age of Onset  . Heart disease Mother   . Heart disease Father   . Diabetes Son 5       Type 1  . Hypertension Son   . Stroke Brother   . Diabetes Other 2       type 1  . Hyperlipidemia Neg Hx   . Depression Neg Hx     ADVANCED DIRECTIVES (Y/N):  N  HEALTH MAINTENANCE: Social History   Tobacco Use  .  Smoking status: Former Smoker    Types: Cigarettes  . Smokeless tobacco: Never Used  Substance Use Topics  . Alcohol use: No    Comment: rare glass of wine  . Drug use: No     Colonoscopy:  PAP:  Bone density:  Lipid panel:  Allergies  Allergen Reactions  . Prevpac [Amoxicill-Clarithro-Lansopraz] Other (See Comments)    Throat closed up  . Latex Rash  . Duloxetine Other (See Comments)    Nausea,  diarrhea and jittery  . Tramadol     GI symptoms    Current Outpatient Medications  Medication Sig Dispense Refill  . acetaminophen (TYLENOL) 500 MG tablet Take 1,000 mg by mouth 3 (three) times daily.    [name] albuterol (PROAIR HFA) 108 (90 Base) MCG/ACT inhaler INHALE 2 PUFFS INTO THE LUNGS EVERY 6 (SIX) HOURS AS NEEDED FOR WHEEZING. (Patient taking differently: Inhale 2 puffs into the lungs every 6 (six) hours as needed for wheezing. ) 8.5 each 1  . cholecalciferol (VITAMIN D) 1000 units tablet Take 1,000 Units by mouth daily.    [name] etodolac (LODINE) 500 MG tablet TAKE 1 TABLET BY MOUTH TWICE A DAY 60 tablet 2  . Glucos-Chondroit-Hyaluron-MSM (GLUCOSAMINE CHONDROITIN JOINT) TABS Take 2 tablets by mouth daily.    [name] LORazepam (ATIVAN) 0.5 MG tablet TAKE 1/2 TO 1 TABLET BY MOUTH AT BEDTIME 30 tablet 0  . pantoprazole (PROTONIX) 40 MG tablet TAKE 1 TABLET (40 MG TOTAL) BY MOUTH 2 (TWO) TIMES DAILY. (Patient taking differently: 40 mg daily. ) 180 tablet 2  . cetirizine (ZYRTEC) 10 MG tablet Take 10 mg by mouth at bedtime.     . chlorzoxazone (PARAFON) 500 MG tablet Take 1 tablet (500 mg total) by mouth 3 (three) times daily as needed. (Patient not taking: Reported on 11/17/2019) 30 tablet 1  . docusate sodium (STOOL SOFTENER) 100 MG capsule Take 100 mg by mouth at bedtime.     . nortriptyline (PAMELOR) 10 MG capsule TAKE 1 CAPSULE (10 MG TOTAL) BY MOUTH AT BEDTIME. (Patient not taking: Reported on 11/17/2019) 90 capsule 0  . polyethylene glycol (MIRALAX / GLYCOLAX) packet Take 17 g by mouth daily as needed for moderate constipation.      No current facility-administered medications for this visit.     OBJECTIVE: Vitals:   11/18/19 1302  BP: (!) 166/112  Pulse: 76  Resp: 20  Temp: (!) 97.3 F (36.3 C)  SpO2: 98%     Body mass index is 39.47 kg/m.    ECOG FS:0 - Asymptomatic  General: Well-developed, well-nourished, no acute distress. Eyes: Pink conjunctiva, anicteric sclera. HEENT:  Normocephalic, moist mucous membranes. Lungs: Clear to auscultation bilaterally. Heart: Regular rate and rhythm. No rubs, murmurs, or gallops. Abdomen: Soft, nontender, nondistended. No organomegaly noted, normoactive bowel sounds. Musculoskeletal: No edema, cyanosis, or clubbing. Neuro: Alert, answering all questions appropriately. Cranial nerves grossly intact. Skin: No rashes or petechiae noted. Psych: Normal affect.  LAB RESULTS:  Lab Results  Component Value Date   NA 140 06/16/2018   K 4.2 06/16/2018   CL 113 (H) 06/16/2018   CO2 22 06/16/2018   GLUCOSE 103 (H) 06/16/2018   BUN 22 06/16/2018   CREATININE 0.78 06/16/2018   CALCIUM 8.8 (L) 06/16/2018   PROT 7.1 06/15/2018   ALBUMIN 3.6 06/15/2018   AST 18 06/15/2018   ALT 15 06/15/2018   ALKPHOS 108 06/15/2018   BILITOT 0.5 06/15/2018   GFRNONAA >60 06/16/2018   GFRAA >60 06/16/2018  Lab Results  Component Value Date   WBC 5.2 11/18/2019   NEUTROABS 3.0 11/18/2019   HGB 10.1 (L) 11/18/2019   HCT 33.9 (L) 11/18/2019   MCV 85.4 11/18/2019   PLT 295 11/18/2019   Lab Results  Component Value Date   IRON 30 11/18/2019   TIBC 414 11/18/2019   IRONPCTSAT 7 (L) 11/18/2019   Lab Results  Component Value Date   FERRITIN 6 (L) 11/18/2019     STUDIES: No results found.  ASSESSMENT: Iron deficiency anemia.  PLAN:    1.  Iron deficiency anemia: Patient's hemoglobin and iron stores have trended down and she is mildly symptomatic. Her most recent EGD and colonoscopy on June 17, 2018 did not reveal specific source of GI bleed, but did note chronic gastritis.  Proceed with 510 mg IV Feraheme today.  Return to clinic in 1 week for second infusion.  Patient will then return to clinic in 3 months with repeat laboratory work, further evaluation, and continuation of treatment if necessary. 2.  Thrombocytopenia: Resolved.   3.  Family history of multiple myeloma: Brother has multiple myeloma.  Currently, there is nothing in  her laboratory work that suggests she has myeloma.  Can consider SPEP in the future, but this is not necessary at this point.    I spent a total of 30 minutes face-to-face with the patient of which greater than 50% of the visit was spent in counseling and coordination of care as detailed above.   Patient expressed understanding and was in agreement with this plan. She also understands that She can call clinic at any time with any questions, concerns, or complaints.    Lloyd Huger, MD   11/19/2019 8:36 AM

## 2019-11-17 ENCOUNTER — Encounter: Payer: Self-pay | Admitting: Oncology

## 2019-11-17 ENCOUNTER — Other Ambulatory Visit: Payer: Self-pay

## 2019-11-17 NOTE — Progress Notes (Signed)
Follow-up IDA, patient is requesting what she can do to pervent her from crashing so she doesn't end up in the hospital with her starts to hgb drops.

## 2019-11-18 ENCOUNTER — Encounter: Payer: Self-pay | Admitting: Oncology

## 2019-11-18 ENCOUNTER — Inpatient Hospital Stay (HOSPITAL_BASED_OUTPATIENT_CLINIC_OR_DEPARTMENT_OTHER): Payer: PPO | Admitting: Oncology

## 2019-11-18 ENCOUNTER — Other Ambulatory Visit: Payer: Self-pay

## 2019-11-18 ENCOUNTER — Inpatient Hospital Stay: Payer: PPO

## 2019-11-18 ENCOUNTER — Inpatient Hospital Stay: Payer: PPO | Attending: Oncology

## 2019-11-18 VITALS — BP 166/95 | HR 77

## 2019-11-18 VITALS — BP 166/112 | HR 76 | Temp 97.3°F | Resp 20 | Wt 246.4 lb

## 2019-11-18 DIAGNOSIS — D509 Iron deficiency anemia, unspecified: Secondary | ICD-10-CM

## 2019-11-18 DIAGNOSIS — D500 Iron deficiency anemia secondary to blood loss (chronic): Secondary | ICD-10-CM

## 2019-11-18 LAB — CBC WITH DIFFERENTIAL/PLATELET
Abs Immature Granulocytes: 0.02 10*3/uL (ref 0.00–0.07)
Basophils Absolute: 0.1 10*3/uL (ref 0.0–0.1)
Basophils Relative: 1 %
Eosinophils Absolute: 0.2 10*3/uL (ref 0.0–0.5)
Eosinophils Relative: 4 %
HCT: 33.9 % — ABNORMAL LOW (ref 36.0–46.0)
Hemoglobin: 10.1 g/dL — ABNORMAL LOW (ref 12.0–15.0)
Immature Granulocytes: 0 %
Lymphocytes Relative: 27 %
Lymphs Abs: 1.4 10*3/uL (ref 0.7–4.0)
MCH: 25.4 pg — ABNORMAL LOW (ref 26.0–34.0)
MCHC: 29.8 g/dL — ABNORMAL LOW (ref 30.0–36.0)
MCV: 85.4 fL (ref 80.0–100.0)
Monocytes Absolute: 0.6 10*3/uL (ref 0.1–1.0)
Monocytes Relative: 11 %
Neutro Abs: 3 10*3/uL (ref 1.7–7.7)
Neutrophils Relative %: 57 %
Platelets: 295 10*3/uL (ref 150–400)
RBC: 3.97 MIL/uL (ref 3.87–5.11)
RDW: 14.9 % (ref 11.5–15.5)
WBC: 5.2 10*3/uL (ref 4.0–10.5)
nRBC: 0 % (ref 0.0–0.2)

## 2019-11-18 LAB — IRON AND TIBC
Iron: 30 ug/dL (ref 28–170)
Saturation Ratios: 7 % — ABNORMAL LOW (ref 10.4–31.8)
TIBC: 414 ug/dL (ref 250–450)
UIBC: 384 ug/dL

## 2019-11-18 LAB — VITAMIN B12: Vitamin B-12: 169 pg/mL — ABNORMAL LOW (ref 180–914)

## 2019-11-18 LAB — FERRITIN: Ferritin: 6 ng/mL — ABNORMAL LOW (ref 11–307)

## 2019-11-18 MED ORDER — SODIUM CHLORIDE 0.9 % IV SOLN
INTRAVENOUS | Status: DC
  Administered 2019-11-18: 14:00:00 via INTRAVENOUS
  Filled 2019-11-18: qty 250

## 2019-11-18 MED ORDER — SODIUM CHLORIDE 0.9 % IV SOLN
510.0000 mg | Freq: Once | INTRAVENOUS | Status: AC
  Administered 2019-11-18: 510 mg via INTRAVENOUS
  Filled 2019-11-18: qty 17

## 2019-11-25 ENCOUNTER — Inpatient Hospital Stay: Payer: PPO

## 2019-11-25 ENCOUNTER — Other Ambulatory Visit: Payer: Self-pay

## 2019-11-25 VITALS — BP 146/83 | HR 64 | Temp 98.7°F | Resp 18

## 2019-11-25 DIAGNOSIS — D509 Iron deficiency anemia, unspecified: Secondary | ICD-10-CM | POA: Diagnosis not present

## 2019-11-25 DIAGNOSIS — D500 Iron deficiency anemia secondary to blood loss (chronic): Secondary | ICD-10-CM

## 2019-11-25 MED ORDER — SODIUM CHLORIDE 0.9 % IV SOLN
Freq: Once | INTRAVENOUS | Status: AC
  Administered 2019-11-25: 14:00:00 via INTRAVENOUS
  Filled 2019-11-25: qty 250

## 2019-11-25 MED ORDER — SODIUM CHLORIDE 0.9 % IV SOLN
510.0000 mg | Freq: Once | INTRAVENOUS | Status: AC
  Administered 2019-11-25: 510 mg via INTRAVENOUS
  Filled 2019-11-25: qty 17

## 2019-12-01 ENCOUNTER — Encounter: Payer: Self-pay | Admitting: Oncology

## 2019-12-01 ENCOUNTER — Other Ambulatory Visit: Payer: Self-pay | Admitting: Oncology

## 2019-12-05 ENCOUNTER — Other Ambulatory Visit: Payer: Self-pay

## 2019-12-05 ENCOUNTER — Inpatient Hospital Stay: Payer: PPO

## 2019-12-05 DIAGNOSIS — D500 Iron deficiency anemia secondary to blood loss (chronic): Secondary | ICD-10-CM

## 2019-12-05 DIAGNOSIS — D509 Iron deficiency anemia, unspecified: Secondary | ICD-10-CM | POA: Diagnosis not present

## 2019-12-05 MED ORDER — CYANOCOBALAMIN 1000 MCG/ML IJ SOLN
1000.0000 ug | Freq: Once | INTRAMUSCULAR | Status: AC
  Administered 2019-12-05: 1000 ug via INTRAMUSCULAR
  Filled 2019-12-05: qty 1

## 2019-12-08 ENCOUNTER — Inpatient Hospital Stay: Payer: PPO

## 2020-01-04 ENCOUNTER — Other Ambulatory Visit: Payer: Self-pay

## 2020-01-05 ENCOUNTER — Inpatient Hospital Stay: Payer: PPO | Attending: Oncology

## 2020-01-05 ENCOUNTER — Other Ambulatory Visit: Payer: Self-pay

## 2020-01-05 DIAGNOSIS — D500 Iron deficiency anemia secondary to blood loss (chronic): Secondary | ICD-10-CM

## 2020-01-05 DIAGNOSIS — D509 Iron deficiency anemia, unspecified: Secondary | ICD-10-CM | POA: Diagnosis not present

## 2020-01-05 MED ORDER — CYANOCOBALAMIN 1000 MCG/ML IJ SOLN
1000.0000 ug | Freq: Once | INTRAMUSCULAR | Status: AC
  Administered 2020-01-05: 14:00:00 1000 ug via INTRAMUSCULAR
  Filled 2020-01-05: qty 1

## 2020-01-24 ENCOUNTER — Telehealth: Payer: Self-pay | Admitting: Internal Medicine

## 2020-01-24 NOTE — Chronic Care Management (AMB) (Signed)
  Chronic Care Management   Note  01/24/2020 Name: Alice Donaldson MRN: [id number] DOB: 1946/11/12  Alice Donaldson is a 74 y.o. year old female who is a primary care patient of Viviana Simpler I, MD. I reached out to Chyla Chiquita Loth by phone today in response to a referral sent by Ms. Bryanda C Men's PCP, [name], MD.   Ms. Graley was given information about Chronic Care Management services today including:  1. CCM service includes personalized support from designated clinical staff supervised by her physician, including individualized plan of care and coordination with other care providers 2. 24/7 contact phone numbers for assistance for urgent and routine care needs. 3. Service will only be billed when office clinical staff spend 20 minutes or more in a month to coordinate care. 4. Only one practitioner may furnish and bill the service in a calendar month. 5. The patient may stop CCM services at any time (effective at the end of the month) by phone call to the office staff. 6. The patient will be responsible for cost sharing (co-pay) of up to 20% of the service fee (after annual deductible is met).  Patient agreed to services and verbal consent obtained.   Follow up plan:   Raynicia Dukes [employer] Scheduler

## 2020-01-30 NOTE — Chronic Care Management (AMB) (Signed)
Chronic Care Management Pharmacy  Name: Alice Donaldson  MRN: [id number] DOB: 09/19/1946  Chief Complaint/ HPI  Alice Donaldson,  74 y.o. , female presents for their Initial CCM visit with the clinical pharmacist via telephone.  PCP : [name], MD  Specialists: Delight Hoh, Oncology Arelia Sneddon, Migraine  Their chronic conditions include: allergic rhinitis, GERD, HLD, episodic mood disorder, anxiety, iron deficiency anemia, chronic venous insufficiency  Patient concerns: would like to discuss alternatives to lorazepam for sleep, noted BP elevated at last several office visits, pt reports her BP increased when going to cancer center for unknown reason --> recommend annual PCP visit   Office Visits: none in previous 6 months   Consult Visit:  11/18/19: [place], Oncology - f/u IDA, feraheme given x 1 and again in 1 week  10/17/19: [place] - migraine with aura  Allergies  Allergen Reactions  . Prevpac [Amoxicill-Clarithro-Lansopraz] Other (See Comments)    Throat closed up  . Latex Rash  . Duloxetine Other (See Comments)    Nausea, diarrhea and jittery  . Tramadol     GI symptoms   Medications: Outpatient Encounter Medications as of 02/01/2020  Medication Sig  . acetaminophen (TYLENOL) 500 MG tablet Take 1,000 mg by mouth 3 (three) times daily.  [name] albuterol (PROAIR HFA) 108 (90 Base) MCG/ACT inhaler INHALE 2 PUFFS INTO THE LUNGS EVERY 6 (SIX) HOURS AS NEEDED FOR WHEEZING. (Patient taking differently: Inhale 2 puffs into the lungs every 6 (six) hours as needed for wheezing. )  . cetirizine (ZYRTEC) 10 MG tablet Take 10 mg by mouth at bedtime.   . chlorzoxazone (PARAFON) 500 MG tablet Take 1 tablet (500 mg total) by mouth 3 (three) times daily as needed. (Patient not taking: Reported on 11/17/2019)  . cholecalciferol (VITAMIN D) 1000 units tablet Take 1,000 Units by mouth daily.  [name] docusate sodium (STOOL SOFTENER) 100 MG capsule Take 100 mg by mouth at  bedtime.   [name] etodolac (LODINE) 500 MG tablet TAKE 1 TABLET BY MOUTH TWICE A DAY  . Glucos-Chondroit-Hyaluron-MSM (GLUCOSAMINE CHONDROITIN JOINT) TABS Take 2 tablets by mouth daily.  [name] LORazepam (ATIVAN) 0.5 MG tablet TAKE 1/2 TO 1 TABLET BY MOUTH AT BEDTIME  . nortriptyline (PAMELOR) 10 MG capsule TAKE 1 CAPSULE (10 MG TOTAL) BY MOUTH AT BEDTIME. (Patient not taking: Reported on 11/17/2019)  . pantoprazole (PROTONIX) 40 MG tablet TAKE 1 TABLET (40 MG TOTAL) BY MOUTH 2 (TWO) TIMES DAILY. (Patient taking differently: 40 mg daily. )  . polyethylene glycol (MIRALAX / GLYCOLAX) packet Take 17 g by mouth daily as needed for moderate constipation.    No facility-administered encounter medications on file as of 02/01/2020.   Current Diagnosis/Assessment: Goals    . Pharmacy Care Plan     Current Barriers:  . Chronic Disease Management support, education, and care coordination needs related to allergic rhinitis, GERD, hyperlipidemia, episodic mood disorder, anxiety, iron deficiency anemia, chronic venous insufficiency  Pharmacist Clinical Goal(s):  . Reduce cardiovascular risk through cholesterol lowering. Recommend heart healthy diet, such as the DASH or mediterranean diet.  . Evaluate blood pressure elevations. Recommend scheduling office visit with Dr. Silvio Pate to assess blood pressure. . Review risk versus benefits of lorazepam for sleep. Discuss whether continuation of current therapy or alternatives preferred with Dr. Silvio Pate. Will follow up with patient by telephone.  Interventions: . Comprehensive medication review performed.  Patient Self Care Activities:  . Self administers medications as prescribed  Initial goal documentation  Hyperlipidemia/CV Risk   Lipid Panel     Component Value Date/Time   CHOL 196 05/06/2016 1131   TRIG 87.0 05/06/2016 1131   HDL 46.20 05/06/2016 1131   CHOLHDL 4 05/06/2016 1131   VLDL 17.4 05/06/2016 1131   LDLCALC 132 (H) 05/06/2016 1131   LDLDIRECT  179.3 04/12/2013 1241    ASCVD 10-year risk: 16.4% (intermediate risk) Patient has failed these meds in past: tried once, major muscle cramps  Patient is currently uncontrolled on the following medications:   No pharmacotherapy We discussed: going to start keto diet soon; exercise - very little due to back pain  Plan: Continue control with diet and exercise; Based on previous clinic blood pressure elevation, keep close watch on blood pressure. Encouraged patient to schedule PCP visit.  Mood disorder/Anxiety   CMP Latest Ref Rng & Units 06/16/2018 06/15/2018 06/03/2017  Glucose 70 - 99 mg/dL 103(H) 108(H) 95  BUN 8 - 23 mg/dL 22 27(H) 14  Creatinine 0.44 - 1.00 mg/dL 0.78 0.89 0.84  Sodium 135 - 145 mmol/L 140 139 140  Potassium 3.5 - 5.1 mmol/L 4.2 4.6 4.2  Chloride 98 - 111 mmol/L 113(H) 109 104  CO2 22 - 32 mmol/L 22 22 29   Calcium 8.9 - 10.3 mg/dL 8.8(L) 9.1 9.8  Total Protein 6.5 - 8.1 g/dL - 7.1 7.1  Total Bilirubin 0.3 - 1.2 mg/dL - 0.5 0.7  Alkaline Phos 38 - 126 U/L - 108 138(H)  AST 15 - 41 U/L - 18 19  ALT 0 - 44 U/L - 15 18   Patient has failed these meds in past: none  Patient is currently controlled on the following medications:   Lorazepam 0.5 mg - 1/2-1 tablet at bedtime  We discussed:  Patient reports taking 1/2 tablet most nights for sleep; started in 2005, reports medication works very well for her but she is aware Dr. Silvio Pate is concerned about the risks. We discussed alternatives including trazodone and low dose doxepin. Patient is open to trying other options, but would prefer to stay on current therapy if Dr. Silvio Pate is comfortable with 1/2 tablet every night for sleep. She has not tolerated antidepressants previously due to nausea.   Plan: Consult with Dr. Silvio Pate regarding alternatives for sleep.  GERD   Patient has failed these meds in past: none reported Patient is currently controlled on the following medications:   Pantoprazole 40 mg - 1 tablet BID  We  discussed: usually takes once daily until acid reflux flare, then increases to BID  Plan: Continue current medications  Spinal stenosis/back pain   Patient has tried these meds in past: steroid injections for a long time, nortriptyline (could not take due to nausea) Patient is currently controlled on the following medications:  Etodolac 500 mg - 1 tablet twice daily  Glucosamine chondroitin tab - 2 tablets daily  OTC - Tylenol extra strength regular (500 mg as needed)  Chlorzoxazone 500 mg - 1 tablet as needed   We discussed:  Only takes muscle relaxer very rarely, takes etodolac twice every day    Plan: Continue current medications  Allergic Rhinitis   Patient has failed these meds in past: none Patient is currently controlled on the following medications:   Cetirizine 10 mg - 1 tablet daily (PM)  Albuterol - PRN  We discussed:  Very rarely needs albuterol during allergy season  Plan: Continue current medications  Medication Management  Misc: B12 injections, iron infusion as needed  OTCs: MiraLAX - PRN, vitamin  D3 1000 IU daily, docusate 100 mg - PRN, niacin, zinc (hair loss)  Pharmacy: [facility]   Adherence: no concerns  Affordability: no concerns  CCM Follow Up:  July 18, 2020 at 3:45 PM (telephone)  [name], PharmD Clinical Pharmacist [facility] [phone]

## 2020-01-31 ENCOUNTER — Other Ambulatory Visit: Payer: Self-pay | Admitting: Internal Medicine

## 2020-01-31 ENCOUNTER — Telehealth: Payer: Self-pay

## 2020-01-31 DIAGNOSIS — E785 Hyperlipidemia, unspecified: Secondary | ICD-10-CM

## 2020-01-31 DIAGNOSIS — K219 Gastro-esophageal reflux disease without esophagitis: Secondary | ICD-10-CM

## 2020-01-31 NOTE — Telephone Encounter (Signed)
Referral created.

## 2020-01-31 NOTE — Telephone Encounter (Signed)
I would like to request a referral for Alice Donaldson to chronic care management pharmacy services focusing on the following conditions:   Hyperlipidemia [E78.5]  GERD [K21.9]  [name], PharmD Clinical Pharmacist [facility] at [facility] [phone]

## 2020-02-01 ENCOUNTER — Other Ambulatory Visit: Payer: Self-pay

## 2020-02-01 ENCOUNTER — Ambulatory Visit: Payer: PPO

## 2020-02-01 DIAGNOSIS — K219 Gastro-esophageal reflux disease without esophagitis: Secondary | ICD-10-CM

## 2020-02-01 DIAGNOSIS — E785 Hyperlipidemia, unspecified: Secondary | ICD-10-CM

## 2020-02-01 DIAGNOSIS — F39 Unspecified mood [affective] disorder: Secondary | ICD-10-CM

## 2020-02-01 DIAGNOSIS — D509 Iron deficiency anemia, unspecified: Secondary | ICD-10-CM

## 2020-02-01 DIAGNOSIS — I872 Venous insufficiency (chronic) (peripheral): Secondary | ICD-10-CM

## 2020-02-01 DIAGNOSIS — F419 Anxiety disorder, unspecified: Secondary | ICD-10-CM

## 2020-02-01 DIAGNOSIS — J309 Allergic rhinitis, unspecified: Secondary | ICD-10-CM

## 2020-02-03 NOTE — Patient Instructions (Addendum)
Dear Alice Donaldson,  It was a pleasure meeting you during our initial appointment on February 01, 2020. Below is a summary of the goals we discussed and components of chronic care management. Please contact me anytime with questions or concerns.   Visit Information  Goals Addressed            This Visit's Progress   . Pharmacy Care Plan       Current Barriers:  . Chronic Disease Management support, education, and care coordination needs related to allergic rhinitis, GERD, hyperlipidemia, episodic mood disorder, anxiety, iron deficiency anemia, chronic venous insufficiency  Pharmacist Clinical Goal(s):  . Reduce cardiovascular risk through cholesterol lowering. Recommend heart healthy diet, such as the DASH or mediterranean diet.  . Evaluate blood pressure elevations. Recommend scheduling office visit with Dr. Letvak to assess blood pressure. . Review risk versus benefits of lorazepam for sleep. Discuss whether continuation of current therapy or alternatives preferred with Dr. Letvak. Will follow up with patient by telephone.  Interventions: . Comprehensive medication review performed.  Patient Self Care Activities:  . Self administers medications as prescribed  Initial goal documentation       Alice Donaldson was given information about Chronic Care Management services today including:  1. CCM service includes personalized support from designated clinical staff supervised by her physician, including individualized plan of care and coordination with other care providers 2. 24/7 contact phone numbers for assistance for urgent and routine care needs. 3. Service will only be billed when office clinical staff spend 20 minutes or more in a month to coordinate care. 4. Only one practitioner may furnish and bill the service in a calendar month. 5. The patient may stop CCM services at any time (effective at the end of the month) by phone call to the office staff. 6. The patient will be  responsible for cost sharing (co-pay) of up to 20% of the service fee (after annual deductible is met).  Patient agreed to services and verbal consent obtained.   Telephone follow up appointment with pharmacy team member scheduled for:  July 18, 2020 at 3:45 PM   Michelle Adams, PharmD Clinical Pharmacist [facility] [place]  DASH Eating Plan DASH stands for "Dietary Approaches to Stop Hypertension." The DASH eating plan is a healthy eating plan that has been shown to reduce high blood pressure (hypertension). It may also reduce your risk for type 2 diabetes, heart disease, and stroke. The DASH eating plan may also help with weight loss. What are tips for following this plan?  General guidelines  Avoid eating more than 2,300 mg (milligrams) of salt (sodium) a day. If you have hypertension, you may need to reduce your sodium intake to 1,500 mg a day.  Limit alcohol intake to no more than 1 drink a day for nonpregnant women and 2 drinks a day for men. One drink equals 12 oz of beer, 5 oz of wine, or 1 oz of hard liquor.  Work with your health care provider to maintain a healthy body weight or to lose weight. Ask what an ideal weight is for you.  Get at least 30 minutes of exercise that causes your heart to beat faster (aerobic exercise) most days of the week. Activities may include walking, swimming, or biking.  Work with your health care provider or diet and nutrition specialist (dietitian) to adjust your eating plan to your individual calorie needs. Reading food labels   Check food labels for the amount of sodium   per serving. Choose foods with less than 5 percent of the Daily Value of sodium. Generally, foods with less than 300 mg of sodium per serving fit into this eating plan.  To find whole grains, look for the word "whole" as the first word in the ingredient list. Shopping  Buy products labeled as "low-sodium" or "no salt added."  Buy fresh foods. Avoid  canned foods and premade or frozen meals. Cooking  Avoid adding salt when cooking. Use salt-free seasonings or herbs instead of table salt or sea salt. Check with your health care provider or pharmacist before using salt substitutes.  Do not fry foods. Cook foods using healthy methods such as baking, boiling, grilling, and broiling instead.  Cook with heart-healthy oils, such as olive, canola, soybean, or sunflower oil. Meal planning  Eat a balanced diet that includes: ? 5 or more servings of fruits and vegetables each day. At each meal, try to fill half of your plate with fruits and vegetables. ? Up to 6-8 servings of whole grains each day. ? Less than 6 oz of lean meat, poultry, or fish each day. A 3-oz serving of meat is about the same size as a deck of cards. One egg equals 1 oz. ? 2 servings of low-fat dairy each day. ? A serving of nuts, seeds, or beans 5 times each week. ? Heart-healthy fats. Healthy fats called Omega-3 fatty acids are found in foods such as flaxseeds and coldwater fish, like sardines, salmon, and mackerel.  Limit how much you eat of the following: ? Canned or prepackaged foods. ? Food that is high in trans fat, such as fried foods. ? Food that is high in saturated fat, such as fatty meat. ? Sweets, desserts, sugary drinks, and other foods with added sugar. ? Full-fat dairy products.  Do not salt foods before eating.  Try to eat at least 2 vegetarian meals each week.  Eat more home-cooked food and less restaurant, buffet, and fast food.  When eating at a restaurant, ask that your food be prepared with less salt or no salt, if possible. What foods are recommended? The items listed may not be a complete list. Talk with your dietitian about what dietary choices are best for you. Grains Whole-grain or whole-wheat bread. Whole-grain or whole-wheat pasta. Brown rice. Oatmeal. Quinoa. Bulgur. Whole-grain and low-sodium cereals. Pita bread. Low-fat, low-sodium  crackers. Whole-wheat flour tortillas. Vegetables Fresh or frozen vegetables (raw, steamed, roasted, or grilled). Low-sodium or reduced-sodium tomato and vegetable juice. Low-sodium or reduced-sodium tomato sauce and tomato paste. Low-sodium or reduced-sodium canned vegetables. Fruits All fresh, dried, or frozen fruit. Canned fruit in natural juice (without added sugar). Meat and other protein foods Skinless chicken or turkey. Ground chicken or turkey. Pork with fat trimmed off. Fish and seafood. Egg whites. Dried beans, peas, or lentils. Unsalted nuts, nut butters, and seeds. Unsalted canned beans. Lean cuts of beef with fat trimmed off. Low-sodium, lean deli meat. Dairy Low-fat (1%) or fat-free (skim) milk. Fat-free, low-fat, or reduced-fat cheeses. Nonfat, low-sodium ricotta or cottage cheese. Low-fat or nonfat yogurt. Low-fat, low-sodium cheese. Fats and oils Soft margarine without trans fats. Vegetable oil. Low-fat, reduced-fat, or light mayonnaise and salad dressings (reduced-sodium). Canola, safflower, olive, soybean, and sunflower oils. Avocado. Seasoning and other foods Herbs. Spices. Seasoning mixes without salt. Unsalted popcorn and pretzels. Fat-free sweets. What foods are not recommended? The items listed may not be a complete list. Talk with your dietitian about what dietary choices are best for you. Grains   Baked goods made with fat, such as croissants, muffins, or some breads. Dry pasta or rice meal packs. Vegetables Creamed or fried vegetables. Vegetables in a cheese sauce. Regular canned vegetables (not low-sodium or reduced-sodium). Regular canned tomato sauce and paste (not low-sodium or reduced-sodium). Regular tomato and vegetable juice (not low-sodium or reduced-sodium). Pickles. Olives. Fruits Canned fruit in a light or heavy syrup. Fried fruit. Fruit in cream or butter sauce. Meat and other protein foods Fatty cuts of meat. Ribs. Fried meat. Bacon. Sausage. Bologna and  other processed lunch meats. Salami. Fatback. Hotdogs. Bratwurst. Salted nuts and seeds. Canned beans with added salt. Canned or smoked fish. Whole eggs or egg yolks. Chicken or turkey with skin. Dairy Whole or 2% milk, cream, and half-and-half. Whole or full-fat cream cheese. Whole-fat or sweetened yogurt. Full-fat cheese. Nondairy creamers. Whipped toppings. Processed cheese and cheese spreads. Fats and oils Butter. Stick margarine. Lard. Shortening. Ghee. Bacon fat. Tropical oils, such as coconut, palm kernel, or palm oil. Seasoning and other foods Salted popcorn and pretzels. Onion salt, garlic salt, seasoned salt, table salt, and sea salt. Worcestershire sauce. Tartar sauce. Barbecue sauce. Teriyaki sauce. Soy sauce, including reduced-sodium. Steak sauce. Canned and packaged gravies. Fish sauce. Oyster sauce. Cocktail sauce. Horseradish that you find on the shelf. Ketchup. Mustard. Meat flavorings and tenderizers. Bouillon cubes. Hot sauce and Tabasco sauce. Premade or packaged marinades. Premade or packaged taco seasonings. Relishes. Regular salad dressings. Where to find more information:  National Heart, Lung, and Blood Institute: www.nhlbi.nih.gov  American Heart Association: www.heart.org Summary  The DASH eating plan is a healthy eating plan that has been shown to reduce high blood pressure (hypertension). It may also reduce your risk for type 2 diabetes, heart disease, and stroke.  With the DASH eating plan, you should limit salt (sodium) intake to 2,300 mg a day. If you have hypertension, you may need to reduce your sodium intake to 1,500 mg a day.  When on the DASH eating plan, aim to eat more fresh fruits and vegetables, whole grains, lean proteins, low-fat dairy, and heart-healthy fats.  Work with your health care provider or diet and nutrition specialist (dietitian) to adjust your eating plan to your individual calorie needs. This information is not intended to replace advice  given to you by your health care provider. Make sure you discuss any questions you have with your health care provider. Document Revised: 11/13/2017 Document Reviewed: 11/24/2016 Elsevier Patient Education  2020 [employer]  [phone]    

## 2020-02-07 NOTE — Addendum Note (Signed)
Addended by: Pilar Grammes on: 02/07/2020 03:27 PM   Modules accepted: Orders

## 2020-02-09 NOTE — Telephone Encounter (Signed)
Please get more information about this program and what kind of letter is required

## 2020-02-11 NOTE — Progress Notes (Signed)
[place]  Telephone:([phone] [phone]  ID: Tye C Lavalle OB: 02-Mar-1946  MR#: [id number]  FHQ#:[id number]  Patient Care Team: [name], MD as PCP - General (Pediatrics) Lloyd Huger, MD as Consulting Physician (Hematology and Oncology) [name], [facility] as Pharmacist (Pharmacist)  CHIEF COMPLAINT: Iron deficiency anemia.  INTERVAL HISTORY: Patient returns to clinic today for repeat laboratory work, further evaluation, and consideration of additional IV Feraheme.  She has chronic weakness and fatigue, but otherwise feels well.  She has no neurologic complaints.  She denies any recent fevers or illnesses. She has a good appetite and denies weight loss.  She denies any chest pain, shortness of breath, cough, or hemoptysis.  She denies any nausea, vomiting, constipation, or diarrhea. She denies any melena or hematochezia. She has no urinary complaints.  Patient offers no further specific complaints today.  REVIEW OF SYSTEMS:   Review of Systems  Constitutional: Positive for malaise/fatigue. Negative for fever and weight loss.  Respiratory: Negative.  Negative for cough, hemoptysis and shortness of breath.   Cardiovascular: Negative.  Negative for chest pain and leg swelling.  Gastrointestinal: Negative.  Negative for abdominal pain, blood in stool and melena.  Genitourinary: Negative.  Negative for hematuria.  Musculoskeletal: Negative.  Negative for back pain.  Skin: Negative.  Negative for rash.  Neurological: Positive for weakness. Negative for sensory change, focal weakness and headaches.  Psychiatric/Behavioral: Negative.  The patient is not nervous/anxious.     As per HPI. Otherwise, a complete review of systems is negative.  PAST MEDICAL HISTORY: Past Medical History:  Diagnosis Date  . Allergic rhinitis, cause unspecified   . Allergy   . Anemia   . Complication of anesthesia   . Diverticulosis   . DVT (deep venous  thrombosis) ([place])   . Gastric ulcer    in past  . GERD (gastroesophageal reflux disease)   . Hemorrhoids, internal   . Hyperlipidemia   . Osteoarthrosis involving, or with mention of more than one site, but not specified as generalized, multiple sites   . PONV (postoperative nausea and vomiting)   . Ulcer (traumatic) of oral mucosa   . Unspecified venous (peripheral) insufficiency     PAST SURGICAL HISTORY: Past Surgical History:  Procedure Laterality Date  . ABDOMINAL HYSTERECTOMY  1996  . BREAST BIOPSY  2003  . CHOLECYSTECTOMY  2000  . COLONOSCOPY WITH PROPOFOL N/A 10/02/2015   Procedure: COLONOSCOPY WITH PROPOFOL;  Surgeon: Lollie Sails, MD;  Location: [facility] ENDOSCOPY;  Service: Endoscopy;  Laterality: N/A;  . COLONOSCOPY WITH PROPOFOL N/A 06/17/2018   Procedure: COLONOSCOPY WITH PROPOFOL;  Surgeon: Toledo, Benay Pike, MD;  Location: [facility] ENDOSCOPY;  Service: Gastroenterology;  Laterality: N/A;  . DEXA  5/14   Normal  . ESOPHAGOGASTRODUODENOSCOPY (EGD) WITH PROPOFOL N/A 06/17/2018   Procedure: ESOPHAGOGASTRODUODENOSCOPY (EGD) WITH PROPOFOL;  Surgeon: Toledo, Benay Pike, MD;  Location: [facility] ENDOSCOPY;  Service: Gastroenterology;  Laterality: N/A;  . KNEE ARTHROSCOPY     right in 2007, left in 2011  . TONSILLECTOMY AND ADENOIDECTOMY  childhood    FAMILY HISTORY: Family History  Problem Relation Age of Onset  . Heart disease Mother   . Heart disease Father   . Diabetes Son 5       Type 1  . Hypertension Son   . Stroke Brother   . Diabetes Other 2       type 1  . Hyperlipidemia Neg Hx   . Depression Neg Hx  ADVANCED DIRECTIVES (Y/N):  N  HEALTH MAINTENANCE: Social History   Tobacco Use  . Smoking status: Former Smoker    Types: Cigarettes  . Smokeless tobacco: Never Used  Substance Use Topics  . Alcohol use: No    Comment: rare glass of wine  . Drug use: No     Colonoscopy:  PAP:  Bone density:  Lipid panel:  Allergies  Allergen Reactions  . Prevpac  [Amoxicill-Clarithro-Lansopraz] Other (See Comments)    Throat closed up  . Latex Rash  . Duloxetine Other (See Comments)    Nausea, diarrhea and jittery  . Tramadol     GI symptoms    Current Outpatient Medications  Medication Sig Dispense Refill  . acetaminophen (TYLENOL) 500 MG tablet Take 1,000 mg by mouth 3 (three) times daily.    [name] albuterol (PROAIR HFA) 108 (90 Base) MCG/ACT inhaler INHALE 2 PUFFS INTO THE LUNGS EVERY 6 (SIX) HOURS AS NEEDED FOR WHEEZING. (Patient taking differently: Inhale 2 puffs into the lungs every 6 (six) hours as needed for wheezing. ) 8.5 each 1  . cetirizine (ZYRTEC) 10 MG tablet Take 10 mg by mouth at bedtime.     . chlorzoxazone (PARAFON) 500 MG tablet Take 1 tablet (500 mg total) by mouth 3 (three) times daily as needed. 30 tablet 1  . cholecalciferol (VITAMIN D) 1000 units tablet Take 1,000 Units by mouth daily.    [name] docusate sodium (STOOL SOFTENER) 100 MG capsule Take 100 mg by mouth at bedtime.     [name] etodolac (LODINE) 500 MG tablet TAKE 1 TABLET BY MOUTH TWICE A DAY 60 tablet 2  . Glucos-Chondroit-Hyaluron-MSM (GLUCOSAMINE CHONDROITIN JOINT) TABS Take 2 tablets by mouth daily.    [name] LORazepam (ATIVAN) 0.5 MG tablet TAKE 1/2 TO 1 TABLET BY MOUTH AT BEDTIME 30 tablet 0  . nortriptyline (PAMELOR) 10 MG capsule TAKE 1 CAPSULE (10 MG TOTAL) BY MOUTH AT BEDTIME. (Patient not taking: Reported on 11/17/2019) 90 capsule 0  . pantoprazole (PROTONIX) 40 MG tablet TAKE 1 TABLET (40 MG TOTAL) BY MOUTH 2 (TWO) TIMES DAILY. (Patient taking differently: 40 mg daily. ) 180 tablet 2  . polyethylene glycol (MIRALAX / GLYCOLAX) packet Take 17 g by mouth daily as needed for moderate constipation.      No current facility-administered medications for this visit.    OBJECTIVE: Vitals:   02/17/20 1431  BP: (!) 166/87  Pulse: 67  Temp: (!) 96.1 F (35.6 C)  SpO2: 100%     Body mass index is 39.42 kg/m.    ECOG FS:0 - Asymptomatic  General: Well-developed,  well-nourished, no acute distress. Eyes: Pink conjunctiva, anicteric sclera. HEENT: Normocephalic, moist mucous membranes. Lungs: No audible wheezing or coughing. Heart: Regular rate and rhythm. Abdomen: Soft, nontender, no obvious distention. Musculoskeletal: No edema, cyanosis, or clubbing. Neuro: Alert, answering all questions appropriately. Cranial nerves grossly intact. Skin: No rashes or petechiae noted. Psych: Normal affect.   LAB RESULTS:  Lab Results  Component Value Date   NA 140 06/16/2018   K 4.2 06/16/2018   CL 113 (H) 06/16/2018   CO2 22 06/16/2018   GLUCOSE 103 (H) 06/16/2018   BUN 22 06/16/2018   CREATININE 0.78 06/16/2018   CALCIUM 8.8 (L) 06/16/2018   PROT 7.1 06/15/2018   ALBUMIN 3.6 06/15/2018   AST 18 06/15/2018   ALT 15 06/15/2018   ALKPHOS 108 06/15/2018   BILITOT 0.5 06/15/2018   GFRNONAA >60 06/16/2018   GFRAA >60 06/16/2018    Lab  Results  Component Value Date   WBC 6.2 02/17/2020   NEUTROABS 3.2 02/17/2020   HGB 12.9 02/17/2020   HCT 41.5 02/17/2020   MCV 93.3 02/17/2020   PLT 235 02/17/2020   Lab Results  Component Value Date   IRON 51 02/17/2020   TIBC 381 02/17/2020   IRONPCTSAT 13 02/17/2020   Lab Results  Component Value Date   FERRITIN 16 02/17/2020     STUDIES: No results found.  ASSESSMENT: Iron deficiency anemia.  PLAN:    1.  Iron deficiency anemia: Patient's hemoglobin and iron stores are now within normal limits. Her most recent EGD and colonoscopy on June 17, 2018 did not reveal specific source of GI bleed, but did note chronic gastritis.  She does not require additional IV Feraheme today.  Patient last received treatment on November 25, 2019.  Return to clinic in 3 months with repeat laboratory work, further evaluation, and continuation of treatment if necessary. 2.  B12 deficiency: Resolved.  Patient last received B12 injection on January 05, 2020.   3.  Thrombocytopenia: Resolved.   4.  Family history of  multiple myeloma: Brother has multiple myeloma.  Currently, there is nothing in her laboratory work that suggests she has myeloma.  Can consider SPEP in the future, but this is not necessary at this point.    Patient expressed understanding and was in agreement with this plan. She also understands that She can call clinic at any time with any questions, concerns, or complaints.    Lloyd Huger, MD   02/19/2020 7:46 AM

## 2020-02-16 NOTE — Progress Notes (Signed)
Patient voicemail is not accepting messages!

## 2020-02-17 ENCOUNTER — Inpatient Hospital Stay: Payer: PPO

## 2020-02-17 ENCOUNTER — Inpatient Hospital Stay: Payer: PPO | Attending: Oncology

## 2020-02-17 ENCOUNTER — Other Ambulatory Visit: Payer: Self-pay | Admitting: Emergency Medicine

## 2020-02-17 ENCOUNTER — Other Ambulatory Visit: Payer: Self-pay

## 2020-02-17 ENCOUNTER — Encounter: Payer: Self-pay | Admitting: Oncology

## 2020-02-17 ENCOUNTER — Inpatient Hospital Stay (HOSPITAL_BASED_OUTPATIENT_CLINIC_OR_DEPARTMENT_OTHER): Payer: PPO | Admitting: Oncology

## 2020-02-17 VITALS — BP 166/87 | HR 67 | Temp 96.1°F | Wt 246.1 lb

## 2020-02-17 DIAGNOSIS — Z807 Family history of other malignant neoplasms of lymphoid, hematopoietic and related tissues: Secondary | ICD-10-CM | POA: Diagnosis not present

## 2020-02-17 DIAGNOSIS — D500 Iron deficiency anemia secondary to blood loss (chronic): Secondary | ICD-10-CM | POA: Diagnosis not present

## 2020-02-17 DIAGNOSIS — R531 Weakness: Secondary | ICD-10-CM | POA: Diagnosis not present

## 2020-02-17 DIAGNOSIS — D509 Iron deficiency anemia, unspecified: Secondary | ICD-10-CM

## 2020-02-17 DIAGNOSIS — R5382 Chronic fatigue, unspecified: Secondary | ICD-10-CM | POA: Diagnosis not present

## 2020-02-17 LAB — CBC WITH DIFFERENTIAL/PLATELET
Abs Immature Granulocytes: 0.02 10*3/uL (ref 0.00–0.07)
Basophils Absolute: 0.1 10*3/uL (ref 0.0–0.1)
Basophils Relative: 1 %
Eosinophils Absolute: 0.3 10*3/uL (ref 0.0–0.5)
Eosinophils Relative: 5 %
HCT: 41.5 % (ref 36.0–46.0)
Hemoglobin: 12.9 g/dL (ref 12.0–15.0)
Immature Granulocytes: 0 %
Lymphocytes Relative: 30 %
Lymphs Abs: 1.9 10*3/uL (ref 0.7–4.0)
MCH: 29 pg (ref 26.0–34.0)
MCHC: 31.1 g/dL (ref 30.0–36.0)
MCV: 93.3 fL (ref 80.0–100.0)
Monocytes Absolute: 0.8 10*3/uL (ref 0.1–1.0)
Monocytes Relative: 12 %
Neutro Abs: 3.2 10*3/uL (ref 1.7–7.7)
Neutrophils Relative %: 52 %
Platelets: 235 10*3/uL (ref 150–400)
RBC: 4.45 MIL/uL (ref 3.87–5.11)
RDW: 15.5 % (ref 11.5–15.5)
WBC: 6.2 10*3/uL (ref 4.0–10.5)
nRBC: 0 % (ref 0.0–0.2)

## 2020-02-17 LAB — IRON AND TIBC
Iron: 51 ug/dL (ref 28–170)
Saturation Ratios: 13 % (ref 10.4–31.8)
TIBC: 381 ug/dL (ref 250–450)
UIBC: 330 ug/dL

## 2020-02-17 LAB — FERRITIN: Ferritin: 16 ng/mL (ref 11–307)

## 2020-02-17 LAB — VITAMIN B12: Vitamin B-12: 207 pg/mL (ref 180–914)

## 2020-02-21 ENCOUNTER — Ambulatory Visit: Payer: PPO | Admitting: Internal Medicine

## 2020-03-01 ENCOUNTER — Telehealth: Payer: Self-pay | Admitting: Internal Medicine

## 2020-03-01 NOTE — Telephone Encounter (Signed)
Please let her know that I will need at least a follow up visit soon---or I cannot continue to fill her prescriptions

## 2020-03-01 NOTE — Telephone Encounter (Signed)
Last filled 10-31-19 #30 Last OV 10-11-18 Next OV 02-23-21 (Cancelled [facility] 02-21-20) [facility]

## 2020-03-01 NOTE — Telephone Encounter (Signed)
I advised patient she needed to schedule a follow up appointment w/ Dr.Letvak to get her medication refilled.  Patient said this isn't a good time and that she'd call back to schedule the follow up appointment.

## 2020-05-05 ENCOUNTER — Other Ambulatory Visit: Payer: Self-pay | Admitting: Internal Medicine

## 2020-05-25 ENCOUNTER — Encounter: Payer: Self-pay | Admitting: Oncology

## 2020-05-27 NOTE — Progress Notes (Signed)
[place]  [phone] [phone]  ID: Alice Donaldson OB: 02-11-1946  MR#: [id number]  TXM#:[id number]  Patient Care Team: [name], MD as PCP - General (Pediatrics) Lloyd Huger, MD as Consulting Physician (Hematology and Oncology) [name], [facility] as Pharmacist (Pharmacist)  CHIEF COMPLAINT: Iron deficiency anemia.  INTERVAL HISTORY: Patient returns to clinic today for repeat laboratory work, further evaluation, and consideration of additional IV Feraheme.  She currently feels well and is asymptomatic.  She does not complain of any weakness or fatigue.  She has no neurologic complaints.  She denies any recent fevers or illnesses. She has a good appetite and denies weight loss.  She denies any chest pain, shortness of breath, cough, or hemoptysis.  She denies any nausea, vomiting, constipation, or diarrhea. She denies any melena or hematochezia. She has no urinary complaints.  Patient feels at her baseline offers no specific complaints today.  REVIEW OF SYSTEMS:   Review of Systems  Constitutional: Negative.  Negative for fever, malaise/fatigue and weight loss.  Respiratory: Negative.  Negative for cough, hemoptysis and shortness of breath.   Cardiovascular: Negative.  Negative for chest pain and leg swelling.  Gastrointestinal: Negative.  Negative for abdominal pain, blood in stool and melena.  Genitourinary: Negative.  Negative for hematuria.  Musculoskeletal: Negative.  Negative for back pain.  Skin: Negative.  Negative for rash.  Neurological: Negative.  Negative for sensory change, focal weakness, weakness and headaches.  Psychiatric/Behavioral: Negative.  The patient is not nervous/anxious.     As per HPI. Otherwise, a complete review of systems is negative.  PAST MEDICAL HISTORY: Past Medical History:  Diagnosis Date   Allergic rhinitis, cause unspecified    Allergy    Anemia    Complication of anesthesia     Diverticulosis    DVT (deep venous thrombosis) ([place])    Gastric ulcer    in past   GERD (gastroesophageal reflux disease)    Hemorrhoids, internal    Hyperlipidemia    Osteoarthrosis involving, or with mention of more than one site, but not specified as generalized, multiple sites    PONV (postoperative nausea and vomiting)    Ulcer (traumatic) of oral mucosa    Unspecified venous (peripheral) insufficiency     PAST SURGICAL HISTORY: Past Surgical History:  Procedure Laterality Date   ABDOMINAL HYSTERECTOMY  1996   BREAST BIOPSY  2003   CHOLECYSTECTOMY  2000   COLONOSCOPY WITH PROPOFOL N/A 10/02/2015   Procedure: COLONOSCOPY WITH PROPOFOL;  Surgeon: Lollie Sails, MD;  Location: [facility] ENDOSCOPY;  Service: Endoscopy;  Laterality: N/A;   COLONOSCOPY WITH PROPOFOL N/A 06/17/2018   Procedure: COLONOSCOPY WITH PROPOFOL;  Surgeon: Toledo, Benay Pike, MD;  Location: [facility] ENDOSCOPY;  Service: Gastroenterology;  Laterality: N/A;   DEXA  5/14   Normal   ESOPHAGOGASTRODUODENOSCOPY (EGD) WITH PROPOFOL N/A 06/17/2018   Procedure: ESOPHAGOGASTRODUODENOSCOPY (EGD) WITH PROPOFOL;  Surgeon: Toledo, Benay Pike, MD;  Location: [facility] ENDOSCOPY;  Service: Gastroenterology;  Laterality: N/A;   KNEE ARTHROSCOPY     right in 2007, left in 2011   TONSILLECTOMY AND ADENOIDECTOMY  childhood    FAMILY HISTORY: Family History  Problem Relation Age of Onset   Heart disease Mother    Heart disease Father    Diabetes Son 5       Type 1   Hypertension Son    Stroke Brother    Diabetes Other 2       type 1   Hyperlipidemia  Neg Hx    Depression Neg Hx     ADVANCED DIRECTIVES (Y/N):  N  HEALTH MAINTENANCE: Social History   Tobacco Use   Smoking status: Former Smoker    Types: Cigarettes   Smokeless tobacco: Never Used  Substance Use Topics   Alcohol use: No    Comment: rare glass of wine   Drug use: No     Colonoscopy:  PAP:  Bone density:  Lipid  panel:  Allergies  Allergen Reactions   Prevpac [Amoxicill-Clarithro-Lansopraz] Other (See Comments)    Throat closed up   Latex Rash   Duloxetine Other (See Comments)    Nausea, diarrhea and jittery   Tramadol     GI symptoms    Current Outpatient Medications  Medication Sig Dispense Refill   acetaminophen (TYLENOL) 500 MG tablet Take 1,000 mg by mouth 3 (three) times daily.     albuterol (PROAIR HFA) 108 (90 Base) MCG/ACT inhaler INHALE 2 PUFFS INTO THE LUNGS EVERY 6 (SIX) HOURS AS NEEDED FOR WHEEZING. (Patient taking differently: Inhale 2 puffs into the lungs every 6 (six) hours as needed for wheezing. ) 8.5 each 1   cetirizine (ZYRTEC) 10 MG tablet Take 10 mg by mouth at bedtime.      chlorzoxazone (PARAFON) 500 MG tablet Take 1 tablet (500 mg total) by mouth 3 (three) times daily as needed. 30 tablet 1   cholecalciferol (VITAMIN D) 1000 units tablet Take 1,000 Units by mouth daily.     docusate sodium (STOOL SOFTENER) 100 MG capsule Take 100 mg by mouth at bedtime.      etodolac (LODINE) 500 MG tablet Take 1 tablet (500 mg total) by mouth 2 (two) times daily. Needs Office Visit 60 tablet 0   Glucos-Chondroit-Hyaluron-MSM (GLUCOSAMINE CHONDROITIN JOINT) TABS Take 2 tablets by mouth daily.     LORazepam (ATIVAN) 0.5 MG tablet TAKE 1/2-1 TABLET BY MOUTH AT BEDTIME 30 tablet 0   nortriptyline (PAMELOR) 10 MG capsule TAKE 1 CAPSULE (10 MG TOTAL) BY MOUTH AT BEDTIME. 90 capsule 0   pantoprazole (PROTONIX) 40 MG tablet TAKE 1 TABLET (40 MG TOTAL) BY MOUTH 2 (TWO) TIMES DAILY. (Patient taking differently: 40 mg daily. ) 180 tablet 2   polyethylene glycol (MIRALAX / GLYCOLAX) packet Take 17 g by mouth daily as needed for moderate constipation.      No current facility-administered medications for this visit.    OBJECTIVE: Vitals:   05/29/20 1316  BP: (!) 154/88  Pulse: 74  Resp: 20  Temp: 98 F (36.7 C)  SpO2: 95%     Body mass index is 37.85 kg/m.    ECOG FS:0 -  Asymptomatic  General: Well-developed, well-nourished, no acute distress. Eyes: Pink conjunctiva, anicteric sclera. HEENT: Normocephalic, moist mucous membranes. Lungs: No audible wheezing or coughing. Heart: Regular rate and rhythm. Abdomen: Soft, nontender, no obvious distention. Musculoskeletal: No edema, cyanosis, or clubbing. Neuro: Alert, answering all questions appropriately. Cranial nerves grossly intact. Skin: No rashes or petechiae noted. Psych: Normal affect.   LAB RESULTS:  Lab Results  Component Value Date   NA 140 06/16/2018   K 4.2 06/16/2018   CL 113 (H) 06/16/2018   CO2 22 06/16/2018   GLUCOSE 103 (H) 06/16/2018   BUN 22 06/16/2018   CREATININE 0.78 06/16/2018   CALCIUM 8.8 (L) 06/16/2018   PROT 7.1 06/15/2018   ALBUMIN 3.6 06/15/2018   AST 18 06/15/2018   ALT 15 06/15/2018   ALKPHOS 108 06/15/2018   BILITOT 0.5 06/15/2018  GFRNONAA >60 06/16/2018   GFRAA >60 06/16/2018    Lab Results  Component Value Date   WBC 6.2 05/29/2020   NEUTROABS 3.3 05/29/2020   HGB 14.2 05/29/2020   HCT 43.1 05/29/2020   MCV 89.4 05/29/2020   PLT 265 05/29/2020   Lab Results  Component Value Date   IRON 77 05/29/2020   TIBC 428 05/29/2020   IRONPCTSAT 18 05/29/2020   Lab Results  Component Value Date   FERRITIN 12 05/29/2020     STUDIES: No results found.  ASSESSMENT: Iron deficiency anemia.  PLAN:    1.  Iron deficiency anemia: Patient's hemoglobin and iron stores continue to be within normal limits. Her most recent EGD and colonoscopy on June 17, 2018 did not reveal specific source of GI bleed, but did note chronic gastritis.  She does not require additional IV Feraheme today.  Patient last received treatment on November 25, 2019.  Return to clinic in 3 months with repeat laboratory work only and then again in 6 months with repeat laboratory work, further evaluation, and consideration of additional IV Feraheme.  If patient's laboratory work remains within  normal limits at that time, she likely can be discharged from clinic. 2.  B12 deficiency: Resolved.  Patient last received B12 injection on January 05, 2020.   3.  Thrombocytopenia: Resolved.   4.  Family history of multiple myeloma: Brother has multiple myeloma.  Currently, there is nothing in her laboratory work that suggests she has myeloma.  Can consider SPEP in the future, but this is not necessary at this point.    I spent a total of 20 minutes reviewing chart data, face-to-face evaluation with the patient, counseling and coordination of care as detailed above.  Patient expressed understanding and was in agreement with this plan. She also understands that She can call clinic at any time with any questions, concerns, or complaints.    Lloyd Huger, MD   05/30/2020 7:01 AM

## 2020-05-28 ENCOUNTER — Encounter: Payer: Self-pay | Admitting: Oncology

## 2020-05-28 NOTE — Progress Notes (Signed)
Pt prescreened for appt tomorrow. Reports that she feels like she did when her B12 was low and is hoping that we can check that and maybe get her on a routine for B12.

## 2020-05-29 ENCOUNTER — Encounter: Payer: Self-pay | Admitting: Oncology

## 2020-05-29 ENCOUNTER — Other Ambulatory Visit: Payer: Self-pay

## 2020-05-29 ENCOUNTER — Inpatient Hospital Stay: Payer: PPO

## 2020-05-29 ENCOUNTER — Inpatient Hospital Stay: Payer: PPO | Admitting: Oncology

## 2020-05-29 ENCOUNTER — Inpatient Hospital Stay: Payer: PPO | Attending: Oncology

## 2020-05-29 VITALS — BP 154/88 | HR 74 | Temp 98.0°F | Resp 20 | Wt 236.3 lb

## 2020-05-29 DIAGNOSIS — D509 Iron deficiency anemia, unspecified: Secondary | ICD-10-CM

## 2020-05-29 DIAGNOSIS — Z807 Family history of other malignant neoplasms of lymphoid, hematopoietic and related tissues: Secondary | ICD-10-CM | POA: Diagnosis not present

## 2020-05-29 LAB — IRON AND TIBC
Iron: 77 ug/dL (ref 28–170)
Saturation Ratios: 18 % (ref 10.4–31.8)
TIBC: 428 ug/dL (ref 250–450)
UIBC: 351 ug/dL

## 2020-05-29 LAB — CBC WITH DIFFERENTIAL/PLATELET
Abs Immature Granulocytes: 0.02 10*3/uL (ref 0.00–0.07)
Basophils Absolute: 0.1 10*3/uL (ref 0.0–0.1)
Basophils Relative: 1 %
Eosinophils Absolute: 0.2 10*3/uL (ref 0.0–0.5)
Eosinophils Relative: 4 %
HCT: 43.1 % (ref 36.0–46.0)
Hemoglobin: 14.2 g/dL (ref 12.0–15.0)
Immature Granulocytes: 0 %
Lymphocytes Relative: 28 %
Lymphs Abs: 1.7 10*3/uL (ref 0.7–4.0)
MCH: 29.5 pg (ref 26.0–34.0)
MCHC: 32.9 g/dL (ref 30.0–36.0)
MCV: 89.4 fL (ref 80.0–100.0)
Monocytes Absolute: 0.8 10*3/uL (ref 0.1–1.0)
Monocytes Relative: 13 %
Neutro Abs: 3.3 10*3/uL (ref 1.7–7.7)
Neutrophils Relative %: 54 %
Platelets: 265 10*3/uL (ref 150–400)
RBC: 4.82 MIL/uL (ref 3.87–5.11)
RDW: 12.7 % (ref 11.5–15.5)
WBC: 6.2 10*3/uL (ref 4.0–10.5)
nRBC: 0 % (ref 0.0–0.2)

## 2020-05-29 LAB — VITAMIN B12: Vitamin B-12: 218 pg/mL (ref 180–914)

## 2020-05-29 LAB — FERRITIN: Ferritin: 12 ng/mL (ref 11–307)

## 2020-05-29 NOTE — Progress Notes (Signed)
Patient states at appointment she would like to discuss the need for infusions and if she is reaching a point she may not need to come as frequently.

## 2020-06-03 ENCOUNTER — Other Ambulatory Visit: Payer: Self-pay | Admitting: Internal Medicine

## 2020-06-07 MED ORDER — ETODOLAC 500 MG PO TABS: 500.0000 mg | ORAL_TABLET | Freq: Two times a day (BID) | ORAL | 1 refills | Status: DC

## 2020-06-07 NOTE — Telephone Encounter (Signed)
Patient has been scheduled for AWV in August.

## 2020-07-10 ENCOUNTER — Other Ambulatory Visit: Payer: Self-pay

## 2020-07-18 NOTE — Chronic Care Management (AMB) (Deleted)
Chronic Care Management Pharmacy  Name: Alice Donaldson  MRN: [id number] DOB: 09-26-1946  Chief Complaint/ HPI  Alice Donaldson,  74 y.o. , female presents for their Follow-Up CCM visit with the clinical pharmacist via telephone.  PCP : [name], MD  Specialists: Delight Hoh, Oncology Arelia Sneddon, Migraine  Their chronic conditions include: allergic rhinitis, GERD, HLD, episodic mood disorder, anxiety, iron deficiency anemia, chronic venous insufficiency  Patient concerns: would like to discuss alternatives to lorazepam for sleep, noted BP elevated at last several office visits, pt reports her BP increased when going to cancer center for unknown reason --> recommend annual PCP visit   Office Visits: none in previous 6 months   Last CCM visit 02/01/20  Consult Visit:  11/18/19: [place], Oncology - f/u IDA, feraheme given x 1 and again in 1 week  10/17/19: [place] - migraine with aura  Allergies  Allergen Reactions  . Prevpac [Amoxicill-Clarithro-Lansopraz] Other (See Comments)    Throat closed up  . Latex Rash  . Duloxetine Other (See Comments)    Nausea, diarrhea and jittery  . Tramadol     GI symptoms   Medications: Outpatient Encounter Medications as of 07/18/2020  Medication Sig  . acetaminophen (TYLENOL) 500 MG tablet Take 1,000 mg by mouth 3 (three) times daily.  [name] albuterol (PROAIR HFA) 108 (90 Base) MCG/ACT inhaler INHALE 2 PUFFS INTO THE LUNGS EVERY 6 (SIX) HOURS AS NEEDED FOR WHEEZING. (Patient taking differently: Inhale 2 puffs into the lungs every 6 (six) hours as needed for wheezing. )  . cetirizine (ZYRTEC) 10 MG tablet Take 10 mg by mouth at bedtime.   . chlorzoxazone (PARAFON) 500 MG tablet Take 1 tablet (500 mg total) by mouth 3 (three) times daily as needed.  . cholecalciferol (VITAMIN D) 1000 units tablet Take 1,000 Units by mouth daily.  [name] docusate sodium (STOOL SOFTENER) 100 MG capsule Take 100 mg by mouth at bedtime.   [name] etodolac  (LODINE) 500 MG tablet Take 1 tablet (500 mg total) by mouth 2 (two) times daily.  . Glucos-Chondroit-Hyaluron-MSM (GLUCOSAMINE CHONDROITIN JOINT) TABS Take 2 tablets by mouth daily.  [name] LORazepam (ATIVAN) 0.5 MG tablet TAKE 1/2-1 TABLET BY MOUTH AT BEDTIME  . nortriptyline (PAMELOR) 10 MG capsule TAKE 1 CAPSULE (10 MG TOTAL) BY MOUTH AT BEDTIME.  . pantoprazole (PROTONIX) 40 MG tablet TAKE 1 TABLET (40 MG TOTAL) BY MOUTH 2 (TWO) TIMES DAILY. (Patient taking differently: 40 mg daily. )  . polyethylene glycol (MIRALAX / GLYCOLAX) packet Take 17 g by mouth daily as needed for moderate constipation.    No facility-administered encounter medications on file as of 07/18/2020.   Current Diagnosis/Assessment: Goals    . Pharmacy Care Plan     Current Barriers:  . Chronic Disease Management support, education, and care coordination needs related to allergic rhinitis, GERD, hyperlipidemia, episodic mood disorder, anxiety, iron deficiency anemia, chronic venous insufficiency  Pharmacist Clinical Goal(s):  . Reduce cardiovascular risk through cholesterol lowering. Recommend heart healthy diet, such as the DASH or mediterranean diet.  . Evaluate blood pressure elevations. Recommend scheduling office visit with Dr. Silvio Pate to assess blood pressure. . Review risk versus benefits of lorazepam for sleep. Discuss whether continuation of current therapy or alternatives preferred with Dr. Silvio Pate. Will follow up with patient by telephone.  Interventions: . Comprehensive medication review performed.  Patient Self Care Activities:  . Self administers medications as prescribed  Initial goal documentation      Hyperlipidemia/CV Risk  Lipid Panel     Component Value Date/Time   CHOL 196 05/06/2016 1131   TRIG 87.0 05/06/2016 1131   HDL 46.20 05/06/2016 1131   CHOLHDL 4 05/06/2016 1131   VLDL 17.4 05/06/2016 1131   LDLCALC 132 (H) 05/06/2016 1131   LDLDIRECT 179.3 04/12/2013 1241    ASCVD 10-year risk:  16.4% (intermediate risk) Patient has failed these meds in past: tried once, major muscle cramps  Patient is currently uncontrolled on the following medications:   No pharmacotherapy We discussed: going to start keto diet soon; exercise - very little due to back pain  Plan: Continue control with diet and exercise; Based on previous clinic blood pressure elevation, keep close watch on blood pressure. Encouraged patient to schedule PCP visit.  Mood disorder/Anxiety   CMP Latest Ref Rng & Units 06/16/2018 06/15/2018 06/03/2017  Glucose 70 - 99 mg/dL 103(H) 108(H) 95  BUN 8 - 23 mg/dL 22 27(H) 14  Creatinine 0.44 - 1.00 mg/dL 0.78 0.89 0.84  Sodium 135 - 145 mmol/L 140 139 140  Potassium 3.5 - 5.1 mmol/L 4.2 4.6 4.2  Chloride 98 - 111 mmol/L 113(H) 109 104  CO2 22 - 32 mmol/L 22 22 29   Calcium 8.9 - 10.3 mg/dL 8.8(L) 9.1 9.8  Total Protein 6.5 - 8.1 g/dL - 7.1 7.1  Total Bilirubin 0.3 - 1.2 mg/dL - 0.5 0.7  Alkaline Phos 38 - 126 U/L - 108 138(H)  AST 15 - 41 U/L - 18 19  ALT 0 - 44 U/L - 15 18   Patient has failed these meds in past: none  Patient is currently controlled on the following medications:   Lorazepam 0.5 mg - 1/2-1 tablet at bedtime  We discussed:  Patient reports taking 1/2 tablet most nights for sleep; started in 2005, reports medication works very well for her but she is aware Dr. Silvio Pate is concerned about the risks. We discussed alternatives including trazodone and low dose doxepin. Patient is open to trying other options, but would prefer to stay on current therapy if Dr. Silvio Pate is comfortable with 1/2 tablet every night for sleep. She has not tolerated antidepressants previously due to nausea.   Plan: Consult with Dr. Silvio Pate regarding alternatives for sleep.  GERD   Patient has failed these meds in past: none reported Patient is currently controlled on the following medications:   Pantoprazole 40 mg - 1 tablet BID  We discussed: usually takes once daily until acid  reflux flare, then increases to BID  Plan: Continue current medications  Spinal stenosis/back pain   Patient has tried these meds in past: steroid injections for a long time, nortriptyline (could not take due to nausea) Patient is currently controlled on the following medications:  Etodolac 500 mg - 1 tablet twice daily  Glucosamine chondroitin tab - 2 tablets daily  OTC - Tylenol extra strength regular (500 mg as needed)  Chlorzoxazone 500 mg - 1 tablet as needed   We discussed:  Only takes muscle relaxer very rarely, takes etodolac twice every day    Plan: Continue current medications  Allergic Rhinitis   Patient has failed these meds in past: none Patient is currently controlled on the following medications:   Cetirizine 10 mg - 1 tablet daily (PM)  Albuterol - PRN  We discussed:  Very rarely needs albuterol during allergy season  Plan: Continue current medications  Medication Management  Misc: B12 injections, iron infusion as needed  OTCs: MiraLAX - PRN, vitamin D3 1000 IU daily,  docusate 100 mg - PRN, niacin, zinc (hair loss)  Pharmacy: [facility]   Adherence: no concerns  Affordability: no concerns  CCM Follow Up:  July 18, 2020 at 3:45 PM (telephone)  [name], PharmD Clinical Pharmacist [facility] [phone]

## 2020-07-30 NOTE — Telephone Encounter (Signed)
If she had the COVID vaccine, and has not symptoms now (so 3 days before the appt), I think it is okay for her to keep the appointment. If she hasn't had the vaccine, she needs to be tested and should reschedule

## 2020-07-30 NOTE — Telephone Encounter (Signed)
She is having some symptoms as noted. Scheduled for testing on the 19th. She is fully vaccinated.

## 2020-08-01 ENCOUNTER — Ambulatory Visit: Payer: PPO | Admitting: Internal Medicine

## 2020-08-02 ENCOUNTER — Ambulatory Visit (INDEPENDENT_AMBULATORY_CARE_PROVIDER_SITE_OTHER): Payer: PPO | Admitting: Internal Medicine

## 2020-08-02 ENCOUNTER — Other Ambulatory Visit: Payer: Self-pay

## 2020-08-02 ENCOUNTER — Encounter: Payer: Self-pay | Admitting: Internal Medicine

## 2020-08-02 VITALS — BP 110/80 | HR 76 | Temp 97.8°F | Ht 65.0 in | Wt 235.0 lb

## 2020-08-02 DIAGNOSIS — M48062 Spinal stenosis, lumbar region with neurogenic claudication: Secondary | ICD-10-CM

## 2020-08-02 DIAGNOSIS — F39 Unspecified mood [affective] disorder: Secondary | ICD-10-CM

## 2020-08-02 DIAGNOSIS — Z7189 Other specified counseling: Secondary | ICD-10-CM

## 2020-08-02 DIAGNOSIS — Z0000 Encounter for general adult medical examination without abnormal findings: Secondary | ICD-10-CM

## 2020-08-02 DIAGNOSIS — K219 Gastro-esophageal reflux disease without esophagitis: Secondary | ICD-10-CM | POA: Diagnosis not present

## 2020-08-02 MED ORDER — TIZANIDINE HCL 2 MG PO CAPS: 2.0000 mg | ORAL_CAPSULE | Freq: Three times a day (TID) | ORAL | 3 refills | Status: DC | PRN

## 2020-08-02 MED ORDER — ETODOLAC 500 MG PO TABS: 500.0000 mg | ORAL_TABLET | Freq: Two times a day (BID) | ORAL | 3 refills | Status: DC

## 2020-08-02 MED ORDER — LORAZEPAM 0.5 MG PO TABS: ORAL_TABLET | ORAL | 0 refills | Status: DC

## 2020-08-02 NOTE — Assessment & Plan Note (Signed)
I have personally reviewed the Medicare Annual Wellness questionnaire and have noted 1. The patient's medical and social history 2. Their use of alcohol, tobacco or illicit drugs 3. Their current medications and supplements 4. The patient's functional ability including ADL's, fall risks, home safety risks and hearing or visual             impairment. 5. Diet and physical activities 6. Evidence for depression or mood disorders  The patients weight, height, BMI and visual acuity have been recorded in the chart I have made referrals, counseling and provided education to the patient based review of the above and I have provided the pt with a written personalized care plan for preventive services.  I have provided you with a copy of your personalized plan for preventive services. Please take the time to review along with your updated medication list.  May have 1 last colon in 2024 Due for mammogram---will continue till 37 possibly Discussed exercise Flu vaccine in the fall

## 2020-08-02 NOTE — Assessment & Plan Note (Signed)
On PPI---continue due to NSAID use

## 2020-08-02 NOTE — Assessment & Plan Note (Signed)
Stress at home, etc Mostly anxiety and some dysthymia Just uses lorazepam--many nights

## 2020-08-02 NOTE — Assessment & Plan Note (Signed)
Has BMI of 39 with high cholesterol, arthritis (bad back), ulcer, chronic edema, etc Discussed importance of starting some exercise, healthy eating

## 2020-08-02 NOTE — Assessment & Plan Note (Signed)
See social history 

## 2020-08-02 NOTE — Progress Notes (Signed)
Hearing Screening   Method: Audiometry   125Hz  250Hz  500Hz  1000Hz  2000Hz  3000Hz  4000Hz  6000Hz  8000Hz   Right ear:   40 40 20  40    Left ear:   20 0 20  20    Vision Screening Comments: November 2020

## 2020-08-02 NOTE — Assessment & Plan Note (Signed)
Does okay with etodolac and prn muscle relaxer

## 2020-08-02 NOTE — Progress Notes (Signed)
Subjective:    Patient ID: Alice Donaldson, female    DOB: 1946/10/02, 74 y.o.   MRN: [id number]  HPI Here for Medicare wellness visit and follow up of chronic health conditions This visit occurred during the SARS-CoV-2 public health emergency.  Safety protocols were in place, including screening questions prior to the visit, additional usage of staff PPE, and extensive cleaning of exam room while observing appropriate contact time as indicated for disinfecting solutions.   Reviewed form and advanced directives Reviewed other doctors No alcohol or tobacco Doesn't exercise Does have cataract---senses "film" over her eyes Hearing is okay No falls Occasional depressed mood---will distract herself and not really anhedonic Independent with instrumental ADLs Some memory issues  Has had sneezing and some sore throat Clear nasal discharge Pretty sure it is allergies---better when she just stayed inside Does use zyrtec daily---used some albuterol also (and this clearly helped)  Has noticed more memory problems Feels it is stress related---son losing vision, cries easy, has all responsibility Finished the counseling--not clearly helpful Mostly using the lorazepam in evening--to calm her mind (and she generally sleeps better)  Ongoing back pain Wants to try a different muscle relaxer----uses it for spasm (will feel "sharp stab at times ---like after bending) Uses the etodolac bid every day  Only mild abdominal symptoms--mostly cramp with increased roughage No heartburn or dysphagia --on PPI daily  BMI still 39 Discussed proper eating No recent cholesterol checks--severe cramps with a statin  Current Outpatient Medications on File Prior to Visit  Medication Sig Dispense Refill  . acetaminophen (TYLENOL) 500 MG tablet Take 1,000 mg by mouth every 8 (eight) hours as needed.     [name] albuterol (PROAIR HFA) 108 (90 Base) MCG/ACT inhaler INHALE 2 PUFFS INTO THE LUNGS EVERY 6 (SIX) HOURS AS  NEEDED FOR WHEEZING. (Patient taking differently: Inhale 2 puffs into the lungs every 6 (six) hours as needed for wheezing. ) 8.5 each 1  . cetirizine (ZYRTEC) 10 MG tablet Take 10 mg by mouth at bedtime.     . cholecalciferol (VITAMIN D) 1000 units tablet Take 1,000 Units by mouth daily.    [name] docusate sodium (STOOL SOFTENER) 100 MG capsule Take 100 mg by mouth daily as needed.     . etodolac (LODINE) 500 MG tablet Take 1 tablet (500 mg total) by mouth 2 (two) times daily. 60 tablet 1  . Glucos-Chondroit-Hyaluron-MSM (GLUCOSAMINE CHONDROITIN JOINT) TABS Take 2 tablets by mouth daily.    [name] LORazepam (ATIVAN) 0.5 MG tablet TAKE 1/2-1 TABLET BY MOUTH AT BEDTIME 30 tablet 0  . niacin 500 MG tablet Take 500 mg by mouth at bedtime.    . pantoprazole (PROTONIX) 40 MG tablet TAKE 1 TABLET (40 MG TOTAL) BY MOUTH 2 (TWO) TIMES DAILY. (Patient taking differently: 40 mg daily. ) 180 tablet 2  . polyethylene glycol (MIRALAX / GLYCOLAX) packet Take 17 g by mouth daily as needed for moderate constipation.     . TURMERIC PO Take by mouth.     No current facility-administered medications on file prior to visit.    Allergies  Allergen Reactions  . Prevpac [Amoxicill-Clarithro-Lansopraz] Other (See Comments)    Throat closed up  . Latex Rash  . Duloxetine Other (See Comments)    Nausea, diarrhea and jittery  . Tramadol     GI symptoms    Past Medical History:  Diagnosis Date  . Allergic rhinitis, cause unspecified   . Allergy   . Anemia   . Complication of  anesthesia   . Diverticulosis   . DVT (deep venous thrombosis) ([place])   . Gastric ulcer    in past  . GERD (gastroesophageal reflux disease)   . Hemorrhoids, internal   . Hyperlipidemia   . Osteoarthrosis involving, or with mention of more than one site, but not specified as generalized, multiple sites   . PONV (postoperative nausea and vomiting)   . Ulcer (traumatic) of oral mucosa   . Unspecified venous (peripheral) insufficiency      Past Surgical History:  Procedure Laterality Date  . ABDOMINAL HYSTERECTOMY  1996  . BREAST BIOPSY  2003  . CHOLECYSTECTOMY  2000  . COLONOSCOPY WITH PROPOFOL N/A 10/02/2015   Procedure: COLONOSCOPY WITH PROPOFOL;  Surgeon: Lollie Sails, MD;  Location: [facility] ENDOSCOPY;  Service: Endoscopy;  Laterality: N/A;  . COLONOSCOPY WITH PROPOFOL N/A 06/17/2018   Procedure: COLONOSCOPY WITH PROPOFOL;  Surgeon: Toledo, Benay Pike, MD;  Location: [facility] ENDOSCOPY;  Service: Gastroenterology;  Laterality: N/A;  . DEXA  5/14   Normal  . ESOPHAGOGASTRODUODENOSCOPY (EGD) WITH PROPOFOL N/A 06/17/2018   Procedure: ESOPHAGOGASTRODUODENOSCOPY (EGD) WITH PROPOFOL;  Surgeon: Toledo, Benay Pike, MD;  Location: [facility] ENDOSCOPY;  Service: Gastroenterology;  Laterality: N/A;  . KNEE ARTHROSCOPY     right in 2007, left in 2011  . TONSILLECTOMY AND ADENOIDECTOMY  childhood    Family History  Problem Relation Age of Onset  . Heart disease Mother   . Heart disease Father   . Diabetes Son 5       Type 1  . Hypertension Son   . Stroke Brother   . Diabetes Other 2       type 1  . Hyperlipidemia Neg Hx   . Depression Neg Hx     Social History   Socioeconomic History  . Marital status: Married    Spouse name: Not on file  . Number of children: 1  . Years of education: Not on file  . Highest education level: Not on file  Occupational History  . Occupation: Copywriter, advertising    Comment: retired  Tobacco Use  . Smoking status: Former Smoker    Types: Cigarettes  . Smokeless tobacco: Never Used  Substance and Sexual Activity  . Alcohol use: No    Comment: rare glass of wine  . Drug use: No  . Sexual activity: Not on file  Other Topics Concern  . Not on file  Social History Narrative   No living will   Asks for husband to make decisions-- but not sure he can after stroke. Son Joneen Caraway should now do this   Would accept resuscitation--but no prolonged ventilation   Doesn't want tube feeds if cognitively  unaware   Social Determinants of Health   Financial Resource Strain:   . Difficulty of Paying Living Expenses: Not on file  Food Insecurity:   . Worried About Charity fundraiser in the Last Year: Not on file  . Ran Out of Food in the Last Year: Not on file  Transportation Needs:   . Lack of Transportation (Medical): Not on file  . Lack of Transportation (Non-Medical): Not on file  Physical Activity:   . Days of Exercise per Week: Not on file  . Minutes of Exercise per Session: Not on file  Stress:   . Feeling of Stress : Not on file  Social Connections:   . Frequency of Communication with Friends and Family: Not on file  . Frequency of Social Gatherings with Friends and  Family: Not on file  . Attends Religious Services: Not on file  . Active Member of Clubs or Organizations: Not on file  . Attends Archivist Meetings: Not on file  . Marital Status: Not on file  Intimate Partner Violence:   . Fear of Current or Ex-Partner: Not on file  . Emotionally Abused: Not on file  . Physically Abused: Not on file  . Sexually Abused: Not on file   Review of Systems Sleeps okay most of the time---some trouble maintaining Wears seat belt Teeth okay---keeps up with detnist Some itching in ears--cortisone (OTC) helps No other skin lesion Bowels are fine--no blood No dysuria----rare mild urge incontinence General aching and pains---thumb, knees      Objective:   Physical Exam Eyes:     Conjunctiva/sclera: Conjunctivae normal.     Pupils: Pupils are equal, round, and reactive to light.  Cardiovascular:     Rate and Rhythm: Normal rate and regular rhythm.     Pulses: Normal pulses.     Heart sounds: No murmur heard.  No gallop.   Pulmonary:     Effort: Pulmonary effort is normal.     Breath sounds: Normal breath sounds. No wheezing or rales.  Abdominal:     Palpations: Abdomen is soft.     Tenderness: There is no abdominal tenderness.  Musculoskeletal:     Right  lower leg: No edema.     Left lower leg: No edema.  Skin:    Findings: No rash.     Comments: Dry skin in eyes  Neurological:     Mental Status: She is alert and oriented to person, place, and time.     Comments: President--- "Biden, Obama----Trump" [id number] Recall 3/3  Psychiatric:        Mood and Affect: Mood normal.        Behavior: Behavior normal.            Assessment & Plan:

## 2020-08-03 LAB — COMPREHENSIVE METABOLIC PANEL
ALT: 13 U/L (ref 0–35)
AST: 15 U/L (ref 0–37)
Albumin: 4 g/dL (ref 3.5–5.2)
Alkaline Phosphatase: 131 U/L — ABNORMAL HIGH (ref 39–117)
BUN: 19 mg/dL (ref 6–23)
CO2: 26 mEq/L (ref 19–32)
Calcium: 10 mg/dL (ref 8.4–10.5)
Chloride: 107 mEq/L (ref 96–112)
Creatinine, Ser: 1 mg/dL (ref 0.40–1.20)
GFR: 53.93 mL/min — ABNORMAL LOW (ref >60.00)
Glucose, Bld: 95 mg/dL (ref 70–99)
Potassium: 4.6 mEq/L (ref 3.5–5.1)
Sodium: 142 mEq/L (ref 135–145)
Total Bilirubin: 0.8 mg/dL (ref 0.2–1.2)
Total Protein: 7 g/dL (ref 6.0–8.3)

## 2020-08-03 LAB — T4, FREE: Free T4: 0.84 ng/dL (ref 0.60–1.60)

## 2020-08-09 NOTE — Telephone Encounter (Signed)
Handicapped Placard form is in Dr.Letvak's in box.

## 2020-08-09 NOTE — Telephone Encounter (Signed)
Signed Please find out if she wants it mailed or for pickup

## 2020-08-18 ENCOUNTER — Emergency Department: Payer: PPO

## 2020-08-18 ENCOUNTER — Emergency Department
Admission: EM | Admit: 2020-08-18 | Discharge: 2020-08-18 | Disposition: A | Discharge status: Home / Self Care | Payer: PPO | Attending: Emergency Medicine | Admitting: Emergency Medicine

## 2020-08-18 ENCOUNTER — Other Ambulatory Visit: Payer: Self-pay

## 2020-08-18 ENCOUNTER — Encounter: Payer: Self-pay | Admitting: Intensive Care

## 2020-08-18 DIAGNOSIS — Z87891 Personal history of nicotine dependence: Secondary | ICD-10-CM | POA: Insufficient documentation

## 2020-08-18 DIAGNOSIS — I10 Essential (primary) hypertension: Secondary | ICD-10-CM | POA: Diagnosis not present

## 2020-08-18 DIAGNOSIS — R911 Solitary pulmonary nodule: Secondary | ICD-10-CM

## 2020-08-18 DIAGNOSIS — Z91040 Latex allergy status: Secondary | ICD-10-CM | POA: Insufficient documentation

## 2020-08-18 DIAGNOSIS — I82812 Embolism and thrombosis of superficial veins of left lower extremities: Secondary | ICD-10-CM | POA: Diagnosis not present

## 2020-08-18 DIAGNOSIS — I82412 Acute embolism and thrombosis of left femoral vein: Secondary | ICD-10-CM | POA: Diagnosis not present

## 2020-08-18 DIAGNOSIS — I82432 Acute embolism and thrombosis of left popliteal vein: Secondary | ICD-10-CM | POA: Diagnosis not present

## 2020-08-18 DIAGNOSIS — Z7901 Long term (current) use of anticoagulants: Secondary | ICD-10-CM | POA: Diagnosis not present

## 2020-08-18 DIAGNOSIS — R918 Other nonspecific abnormal finding of lung field: Secondary | ICD-10-CM | POA: Diagnosis not present

## 2020-08-18 DIAGNOSIS — Z79899 Other long term (current) drug therapy: Secondary | ICD-10-CM | POA: Insufficient documentation

## 2020-08-18 DIAGNOSIS — R600 Localized edema: Secondary | ICD-10-CM | POA: Diagnosis present

## 2020-08-18 DIAGNOSIS — M47814 Spondylosis without myelopathy or radiculopathy, thoracic region: Secondary | ICD-10-CM | POA: Diagnosis not present

## 2020-08-18 DIAGNOSIS — I82402 Acute embolism and thrombosis of unspecified deep veins of left lower extremity: Secondary | ICD-10-CM | POA: Diagnosis not present

## 2020-08-18 DIAGNOSIS — K449 Diaphragmatic hernia without obstruction or gangrene: Secondary | ICD-10-CM | POA: Insufficient documentation

## 2020-08-18 LAB — TROPONIN I (HIGH SENSITIVITY)
Troponin I (High Sensitivity): 7 ng/L (ref <18)
Troponin I (High Sensitivity): 6 ng/L (ref <18)

## 2020-08-18 LAB — BASIC METABOLIC PANEL
Anion gap: 8 (ref 5–15)
BUN: 24 mg/dL — ABNORMAL HIGH (ref 8–23)
CO2: 25 mmol/L (ref 22–32)
Calcium: 9.4 mg/dL (ref 8.9–10.3)
Chloride: 107 mmol/L (ref 98–111)
Creatinine, Ser: 0.9 mg/dL (ref 0.44–1.00)
GFR calc Af Amer: >60 mL/min (ref >60)
GFR calc non Af Amer: >60 mL/min (ref >60)
Glucose, Bld: 96 mg/dL (ref 70–99)
Potassium: 4.3 mmol/L (ref 3.5–5.1)
Sodium: 140 mmol/L (ref 135–145)

## 2020-08-18 LAB — CBC WITH DIFFERENTIAL/PLATELET
Abs Immature Granulocytes: 0.03 10*3/uL (ref 0.00–0.07)
Basophils Absolute: 0.1 10*3/uL (ref 0.0–0.1)
Basophils Relative: 1 %
Eosinophils Absolute: 0.3 10*3/uL (ref 0.0–0.5)
Eosinophils Relative: 4 %
HCT: 38.9 % (ref 36.0–46.0)
Hemoglobin: 12.7 g/dL (ref 12.0–15.0)
Immature Granulocytes: 1 %
Lymphocytes Relative: 28 %
Lymphs Abs: 1.8 10*3/uL (ref 0.7–4.0)
MCH: 29.5 pg (ref 26.0–34.0)
MCHC: 32.6 g/dL (ref 30.0–36.0)
MCV: 90.3 fL (ref 80.0–100.0)
Monocytes Absolute: 0.7 10*3/uL (ref 0.1–1.0)
Monocytes Relative: 10 %
Neutro Abs: 3.7 10*3/uL (ref 1.7–7.7)
Neutrophils Relative %: 56 %
Platelets: 215 10*3/uL (ref 150–400)
RBC: 4.31 MIL/uL (ref 3.87–5.11)
RDW: 13.4 % (ref 11.5–15.5)
WBC: 6.5 10*3/uL (ref 4.0–10.5)
nRBC: 0 % (ref 0.0–0.2)

## 2020-08-18 LAB — BRAIN NATRIURETIC PEPTIDE: B Natriuretic Peptide: 59.6 pg/mL (ref 0.0–100.0)

## 2020-08-18 MED ORDER — APIXABAN 5 MG PO TABS: ORAL_TABLET | ORAL | 0 refills | Status: DC

## 2020-08-18 MED ORDER — IOHEXOL 350 MG/ML SOLN
75.0000 mL | Freq: Once | INTRAVENOUS | Status: AC | PRN
  Administered 2020-08-18: 75 mL via INTRAVENOUS

## 2020-08-18 NOTE — ED Notes (Signed)
This RN and Teacher, English as a foreign language to bedside at this time. Introduced self to pt. Pt set up on cardiac monitor. Pt denies any pain at this time, says she feels "stimulation" in the L leg. Pt denies further needs at this time.

## 2020-08-18 NOTE — ED Notes (Signed)
Pt assisted to the bathroom. Ambulated independently.

## 2020-08-18 NOTE — ED Triage Notes (Signed)
Patient presents with left leg swelling/tightness. HX blood clot in same leg. Denies taking blood thinners

## 2020-08-18 NOTE — ED Provider Notes (Signed)
[facility] Emergency Department Provider Note   ____________________________________________   First MD Initiated Contact with Patient 08/18/20 1115     (approximate)  I have reviewed the triage vital signs and the nursing notes.   HISTORY  Chief Complaint Leg Swelling    HPI Alice Donaldson is a 74 y.o. female with past medical history of hypertension, hyperlipidemia, DVT, and GERD who presents to the ED complaining of leg swelling.  Patient reports that over the past 3 days she has noticed increase swelling to her left lower extremity reminiscent of when she was diagnosed with a DVT multiple years ago.  At that time she was taking Coumadin for DVT, but eventually had it stopped as her clot was thought to be provoked by orthopedic surgery.  She denies any recent surgery or chemotherapy, is not currently taking any hormonal medications.  She does state that over the past couple of days she has noticed increasing shortness of breath with exertion, but is not short of breath at rest and denies any chest pain.        Past Medical History:  Diagnosis Date  . Allergic rhinitis, cause unspecified   . Allergy   . Anemia   . Complication of anesthesia   . Diverticulosis   . DVT (deep venous thrombosis) ([place])   . Gastric ulcer    in past  . GERD (gastroesophageal reflux disease)   . Hemorrhoids, internal   . Hyperlipidemia   . Osteoarthrosis involving, or with mention of more than one site, but not specified as generalized, multiple sites   . PONV (postoperative nausea and vomiting)   . Ulcer (traumatic) of oral mucosa   . Unspecified venous (peripheral) insufficiency     Patient Active Problem List   Diagnosis Date Noted  . Gastritis 06/21/2018  . Anxiety 06/15/2018  . Symptomatic anemia 06/15/2018  . Morbid obesity ([place]) 06/03/2017  . Iron deficiency anemia 08/14/2016  . Advance directive discussed with patient 05/06/2016  . Episodic mood disorder  ([place]) 04/18/2015  . Routine general medical examination at a health care facility 04/12/2013  . GERD (gastroesophageal reflux disease)   . Allergic rhinitis   . Spinal stenosis, lumbar region, with neurogenic claudication   . Hyperlipidemia   . Chronic venous insufficiency     Past Surgical History:  Procedure Laterality Date  . ABDOMINAL HYSTERECTOMY  1996  . BREAST BIOPSY  2003  . CHOLECYSTECTOMY  2000  . COLONOSCOPY WITH PROPOFOL N/A 10/02/2015   Procedure: COLONOSCOPY WITH PROPOFOL;  Surgeon: Lollie Sails, MD;  Location: [facility] ENDOSCOPY;  Service: Endoscopy;  Laterality: N/A;  . COLONOSCOPY WITH PROPOFOL N/A 06/17/2018   Procedure: COLONOSCOPY WITH PROPOFOL;  Surgeon: Toledo, Benay Pike, MD;  Location: [facility] ENDOSCOPY;  Service: Gastroenterology;  Laterality: N/A;  . DEXA  5/14   Normal  . ESOPHAGOGASTRODUODENOSCOPY (EGD) WITH PROPOFOL N/A 06/17/2018   Procedure: ESOPHAGOGASTRODUODENOSCOPY (EGD) WITH PROPOFOL;  Surgeon: Toledo, Benay Pike, MD;  Location: [facility] ENDOSCOPY;  Service: Gastroenterology;  Laterality: N/A;  . KNEE ARTHROSCOPY     right in 2007, left in 2011  . TONSILLECTOMY AND ADENOIDECTOMY  childhood    Prior to Admission medications   Medication Sig Start Date End Date Taking? Authorizing Provider  acetaminophen (TYLENOL) 500 MG tablet Take 1,000 mg by mouth every 8 (eight) hours as needed.     [provider]  albuterol (PROAIR HFA) 108 (90 Base) MCG/ACT inhaler INHALE 2 PUFFS INTO THE LUNGS EVERY 6 (SIX) HOURS  AS NEEDED FOR WHEEZING. Patient taking differently: Inhale 2 puffs into the lungs every 6 (six) hours as needed for wheezing.  05/06/16   [name], MD  apixaban (ELIQUIS) 5 MG TABS tablet Take 2 tablets (10mg ) twice daily for 7 days, then 1 tablet (5mg ) twice daily 08/18/20   Blake Divine, MD  cetirizine (ZYRTEC) 10 MG tablet Take 10 mg by mouth at bedtime.     [provider]  cholecalciferol (VITAMIN D) 1000 units tablet Take 1,000  Units by mouth daily.    [provider]  docusate sodium (STOOL SOFTENER) 100 MG capsule Take 100 mg by mouth daily as needed.     [provider]  Glucos-Chondroit-Hyaluron-MSM (GLUCOSAMINE CHONDROITIN JOINT) TABS Take 2 tablets by mouth daily.    [provider]  LORazepam (ATIVAN) 0.5 MG tablet TAKE 1/2-1 TABLET BY MOUTH AT BEDTIME 08/02/20   Viviana Simpler I, MD  niacin 500 MG tablet Take 500 mg by mouth at bedtime.    [provider]  pantoprazole (PROTONIX) 40 MG tablet TAKE 1 TABLET (40 MG TOTAL) BY MOUTH 2 (TWO) TIMES DAILY. Patient taking differently: 40 mg daily.  03/28/19   [name], MD  polyethylene glycol (MIRALAX / Floria Raveling) packet Take 17 g by mouth daily as needed for moderate constipation.     [provider]  tizanidine (ZANAFLEX) 2 MG capsule Take 1 capsule (2 mg total) by mouth 3 (three) times daily as needed for muscle spasms. 08/02/20   [name], MD  TURMERIC PO Take by mouth.    [provider]    Allergies Prevpac [amoxicill-clarithro-lansopraz], Latex, Duloxetine, and Tramadol  Family History  Problem Relation Age of Onset  . Heart disease Mother   . Heart disease Father   . Diabetes Son 5       Type 1  . Hypertension Son   . Stroke Brother   . Diabetes Other 2       type 1  . Hyperlipidemia Neg Hx   . Depression Neg Hx     Social History Social History   Tobacco Use  . Smoking status: Former Smoker    Types: Cigarettes  . Smokeless tobacco: Never Used  Substance Use Topics  . Alcohol use: No    Comment: rare glass of wine  . Drug use: No    Review of Systems  Constitutional: No fever/chills Eyes: No visual changes. ENT: No sore throat. Cardiovascular: Denies chest pain. Respiratory: Positive for shortness of breath. Gastrointestinal: No abdominal pain.  No nausea, no vomiting.  No diarrhea.  No constipation. Genitourinary: Negative for dysuria. Musculoskeletal: Negative  for back pain.  Positive for leg swelling and pain. Skin: Negative for rash. Neurological: Negative for headaches, focal weakness or numbness.  ____________________________________________   PHYSICAL EXAM:  VITAL SIGNS: ED Triage Vitals [08/18/20 0807]  Enc Vitals Group     BP (!) 162/102     Pulse Rate 85     Resp 16     Temp 98 F (36.7 C)     Temp Source Oral     SpO2 97 %     Weight 235 lb (106.6 kg)     Height 5\' 5"  (1.651 m)     Head Circumference      Peak Flow      Pain Score 1     Pain Loc      Pain Edu?      Excl. in [place]?  Constitutional: Alert and oriented. Eyes: Conjunctivae are normal. Head: Atraumatic. Nose: No congestion/rhinnorhea. Mouth/Throat: Mucous membranes are moist. Neck: Normal ROM Cardiovascular: Normal rate, regular rhythm. Grossly normal heart sounds.  2+ DP pulses bilaterally. Respiratory: Normal respiratory effort.  No retractions. Lungs CTAB. Gastrointestinal: Soft and nontender. No distention. Genitourinary: deferred Musculoskeletal: Diffuse woody edema to left lower extremity extending to knee with mild diffuse tenderness.  No edema or tenderness noted to right lower extremity. Neurologic:  Normal speech and language. No gross focal neurologic deficits are appreciated. Skin:  Skin is warm, dry and intact. No rash noted. Psychiatric: Mood and affect are normal. Speech and behavior are normal.  ____________________________________________   LABS (all labs ordered are listed, but only abnormal results are displayed)  Labs Reviewed  BASIC METABOLIC PANEL - Abnormal; Notable for the following components:      Result Value   BUN 24 (*)    All other components within normal limits  CBC WITH DIFFERENTIAL/PLATELET  BRAIN NATRIURETIC PEPTIDE  TROPONIN I (HIGH SENSITIVITY)  TROPONIN I (HIGH SENSITIVITY)   ____________________________________________  EKG  ED ECG REPORT I, Blake Divine, the attending physician, personally viewed  and interpreted this ECG.   Date: 08/18/2020  EKG Time: 11:43  Rate: 67  Rhythm: normal sinus rhythm  Axis: Normal  Intervals:none  ST&T Change: None   PROCEDURES  Procedure(s) performed (including Critical Care):  Procedures   ____________________________________________   INITIAL IMPRESSION / ASSESSMENT AND PLAN / ED COURSE       74 year old female with past medical history of hypertension, hyperlipidemia, DVT, and GERD who presents to the ED complaining of increasing pain and swelling in her left lower extremity over the past 3 days.  She has woody edema noted on exam and ultrasound is positive for DVT in the left common femoral vein extending into the left popliteal vein.  She otherwise has strong pulses to her left lower extremity.  Given her reported shortness of breath on exertion we will assess for PE with CTA, add on cardiac labs and EKG.  She is not in any respiratory distress and is maintaining O2 sats on room air.  CTA is positive for chronic PE but no evidence of acute PE.  Patient remains hemodynamically stable and maintaining O2 sats on room air.  She has no chest pain or shortness of breath at rest and I doubt acute PE at this time.  2 sets of troponin are negative as well as remainder of lab work.  She is appropriate for discharge home with vascular surgery follow-up, we will start her on Eliquis for anticoagulation.  She was counseled to return to the ED for any chest pain, shortness of breath, increased pain or swelling to her leg, or any other worsening symptoms.  Patient agrees with plan.      ____________________________________________   FINAL CLINICAL IMPRESSION(S) / ED DIAGNOSES  Final diagnoses:  Acute deep vein thrombosis (DVT) of femoral vein of left lower extremity ([place])  Pulmonary nodule  Hiatal hernia     ED Discharge Orders         Ordered    AMB  Referral to Pulmonary Nodule Clinic        08/18/20 1402    apixaban (ELIQUIS) 5 MG TABS  tablet        08/18/20 1407           Note:  This document was prepared using Dragon voice recognition software and may include unintentional dictation errors.   Blake Divine,  MD 08/18/20 1434

## 2020-08-20 ENCOUNTER — Encounter: Payer: Self-pay | Admitting: Oncology

## 2020-08-21 ENCOUNTER — Other Ambulatory Visit: Payer: Self-pay

## 2020-08-21 ENCOUNTER — Encounter (INDEPENDENT_AMBULATORY_CARE_PROVIDER_SITE_OTHER): Payer: Self-pay | Admitting: Vascular Surgery

## 2020-08-21 ENCOUNTER — Ambulatory Visit (INDEPENDENT_AMBULATORY_CARE_PROVIDER_SITE_OTHER): Payer: PPO | Admitting: Vascular Surgery

## 2020-08-21 VITALS — BP 156/77 | HR 76 | Ht 65.0 in | Wt 239.0 lb

## 2020-08-21 DIAGNOSIS — E785 Hyperlipidemia, unspecified: Secondary | ICD-10-CM

## 2020-08-21 DIAGNOSIS — I2782 Chronic pulmonary embolism: Secondary | ICD-10-CM | POA: Diagnosis not present

## 2020-08-21 DIAGNOSIS — I2699 Other pulmonary embolism without acute cor pulmonale: Secondary | ICD-10-CM | POA: Insufficient documentation

## 2020-08-21 DIAGNOSIS — I82412 Acute embolism and thrombosis of left femoral vein: Secondary | ICD-10-CM

## 2020-08-21 NOTE — Telephone Encounter (Signed)
Please set her up with me within the next couple of weeks

## 2020-08-21 NOTE — Assessment & Plan Note (Signed)
The patient has extensive left lower extremity DVT.  Although there is definitely a chronic component part of this, there is also an acute component which would correlate with her new symptoms.  We discussed options today including continued anticoagulation versus mechanical thrombectomy in addition to anticoagulation.  She wants to go home and think about this, but is leaning towards mechanical thrombectomy of the left leg DVT.  Her pulmonary embolus does not require any therapy.  No need to stop anticoagulation if she decides to have this done either.  She is going to go home and think about this and we told her that time is somewhat of the essence and we would like to get that done within the next week if she chooses to have it done

## 2020-08-21 NOTE — Patient Instructions (Signed)
Catheter-Directed Thrombolysis Catheter-directed thrombolysis is a procedure to remove a blood clot (thrombus) from inside a blood vessel. A blood clot inside a blood vessel can block blood flow or break away and travel to another area of the body, which can cause severe problems such as heart attack or stroke. You may have this procedure to treat:  A clot blocking the blood flow in your arm or leg (limb ischemia).  A clot in a large vein (deep vein thrombosis).  A blockage in a dialysis graft.  A clot in the blood flow to your lungs (pulmonary embolism). Tell a health care provider about:  Any allergies you have.  All medicines you are taking, including vitamins, herbs, eye drops, creams, and over-the-counter medicines.  Any problems you or family members have had with anesthetic medicines.  Any blood disorders you have.  Any surgeries you have had.  Any medical conditions you have.  Whether you are pregnant or may be pregnant.  Whether you are breastfeeding.  Any recent injuries, such as a car accident or fall. What are the risks? Generally, this is a safe procedure. However, problems may occur, including:  Bleeding at the catheter site or other places in the body. Bleeding in the brain is a rare but serious complication.  Movement of the clot, requiring more treatment.  Allergic reactions to medicines or dyes.  Infection.  Bruising or swelling.  Damage to blood vessels.  Damage to the kidneys from the contrast dye. What happens before the procedure? Staying hydrated Follow instructions from your health care provider about hydration, which may include:  Up to 2 hours before the procedure - you may continue to drink clear liquids, such as water, clear fruit juice, black coffee, and plain tea.  Eating and drinking restrictions Follow instructions from your health care provider about eating and drinking, which may include:  8 hours before the procedure - stop  eating heavy meals or foods, such as meat, fried foods, or fatty foods.  6 hours before the procedure - stop eating light meals or foods, such as toast or cereal.  6 hours before the procedure - stop drinking milk or drinks that contain milk.  2 hours before the procedure - stop drinking clear liquids. Medicines Ask your health care provider about:  Changing or stopping your regular medicines. This is especially important if you are taking diabetes medicines or blood thinners.  Taking medicines such as aspirin and ibuprofen. These medicines can thin your blood. Do not take these medicines unless your health care provider tells you to take them.  Taking over-the-counter medicines, vitamins, herbs, and supplements. Tests You may have:  Blood tests to check how well your blood clots.  Tests to check your kidney function. General instructions  Do not use any products that contain nicotine or tobacco for at least 4 weeks before the procedure. These products include cigarettes, e-cigarettes, and chewing tobacco. If you need help quitting, ask your health care provider.  Plan to have someone take you home from the hospital or clinic.  Ask your health care provider what steps will be taken to help prevent infection. These may include washing your skin with a germ-killing soap. What happens during the procedure?   An IV will be inserted into one of your veins.  You will be given one or more of the following: ? A medicine to help you relax (sedative). ? A medicine to numb the surgical area (local anesthetic). This is given as an injection near  the incision. ? A medicine to make you fall asleep (general anesthetic).  A small incision will be made in your groin area or in your wrist.  A catheter will be placed through the incision and into a blood vessel that leads to the clot.  The catheter will be moved through the blood vessel toward the clot.  A dye, also called contrast dye, will  be injected through the catheter as it is moved forward. A type of X-ray (fluoroscopy) will be done to guide the movement of the catheter. The dye makes the catheter easy to see during imaging.  When the catheter reaches the clot, your health care provider will break up the clot by doing one of the following: ? Injecting a clot-dissolving medicine through the catheter. ? Threading a device through the catheter and using that device to break up the clot.  The catheter may be left in place to deliver clot-dissolving medicine during the next 24-72 hours.  Your incision may be covered with a bandage (dressing). The procedure may vary among health care providers and hospitals. What happens after the procedure?  You may need to stay in bed at the hospital to receive clot-dissolving medicine through your catheter.  Your blood pressure, heart rate, breathing rate, and blood oxygen level will be monitored until you leave the hospital or clinic.  You may continue to receive fluids and medicines through an IV. Your IV will also be used to remove blood for testing.  You will be given pain medicine as needed.  You may need to wear compression stockings or a compression sleeve on your arm or leg. These garments help to prevent blood clots and reduce swelling.  Your catheter and IV will be removed after treatment for the clot is complete. Summary  Catheter-directed thrombolysis is a procedure to remove a blood clot from inside a blood vessel.  Before the procedure, follow your health care provider's instructions about eating and drinking restrictions. You may be asked to stop eating and drinking several hours before the procedure.  During this procedure, a catheter is inserted through your skin into a blood vessel that leads to the clot.  The clot is either dissolved with medicine or broken up with a device. You may need to stay in the hospital for 24-72 hours while medicine continues to dissolve the  clot. This information is not intended to replace advice given to you by your health care provider. Make sure you discuss any questions you have with your health care provider. Document Revised: 06/13/2019 Document Reviewed: 06/13/2019 Elsevier Patient Education  [place].

## 2020-08-21 NOTE — Assessment & Plan Note (Signed)
lipid control important in reducing the progression of atherosclerotic disease. Continue statin therapy  

## 2020-08-21 NOTE — Progress Notes (Signed)
Patient ID: Alice Donaldson, female   DOB: 03-Apr-1946, 74 y.o.   MRN: [id number]  Chief Complaint  Patient presents with  . New Patient (Initial Visit)    [place] Stat per Allayne Stack     HPI Alice Donaldson is a 74 y.o. female.  I am asked to see the patient by Dr. Charna Archer at the [facility] ER for evaluation of LLE DVT and chronic PE.  About 10 years ago, the patient had a left lower extremity DVT treated with anticoagulation which has resulted in some chronic pain and swelling in the left leg.  About 5 days ago, she began having markedly increased pain and swelling in the left leg.  She went to the emergency room for a work-up over the weekend which demonstrated acute and chronic DVT extending up to the left common femoral vein as well as what appeared to be a chronic pulmonary embolus on the CT scan of the chest.  She was started on Eliquis 10 mg twice daily.  She has not felt well on the Eliquis, but has tolerated this with no signs of bleeding.  No chest pain or shortness of breath.  Her leg swelling has gone down a little bit of her remains prominent.  Her pain is still there as well.  She is referred for further evaluation and potential invasive treatment of her left leg DVT.     Past Medical History:  Diagnosis Date  . Allergic rhinitis, cause unspecified   . Allergy   . Anemia   . Complication of anesthesia   . Diverticulosis   . DVT (deep venous thrombosis) ([place])   . Gastric ulcer    in past  . GERD (gastroesophageal reflux disease)   . Hemorrhoids, internal   . Hyperlipidemia   . Osteoarthrosis involving, or with mention of more than one site, but not specified as generalized, multiple sites   . PONV (postoperative nausea and vomiting)   . Ulcer (traumatic) of oral mucosa   . Unspecified venous (peripheral) insufficiency     Past Surgical History:  Procedure Laterality Date  . ABDOMINAL HYSTERECTOMY  1996  . BREAST BIOPSY  2003  . CHOLECYSTECTOMY  2000  . COLONOSCOPY WITH PROPOFOL  N/A 10/02/2015   Procedure: COLONOSCOPY WITH PROPOFOL;  Surgeon: Lollie Sails, MD;  Location: [facility] ENDOSCOPY;  Service: Endoscopy;  Laterality: N/A;  . COLONOSCOPY WITH PROPOFOL N/A 06/17/2018   Procedure: COLONOSCOPY WITH PROPOFOL;  Surgeon: Toledo, Benay Pike, MD;  Location: [facility] ENDOSCOPY;  Service: Gastroenterology;  Laterality: N/A;  . DEXA  5/14   Normal  . ESOPHAGOGASTRODUODENOSCOPY (EGD) WITH PROPOFOL N/A 06/17/2018   Procedure: ESOPHAGOGASTRODUODENOSCOPY (EGD) WITH PROPOFOL;  Surgeon: Toledo, Benay Pike, MD;  Location: [facility] ENDOSCOPY;  Service: Gastroenterology;  Laterality: N/A;  . KNEE ARTHROSCOPY     right in 2007, left in 2011  . TONSILLECTOMY AND ADENOIDECTOMY  childhood     Family History  Problem Relation Age of Onset  . Heart disease Mother   . Heart disease Father   . Diabetes Son 5       Type 1  . Hypertension Son   . Stroke Brother   . Diabetes Other 2       type 1  . Hyperlipidemia Neg Hx   . Depression Neg Hx      Social History   Tobacco Use  . Smoking status: Former Smoker    Types: Cigarettes  . Smokeless tobacco: Never Used  Substance Use Topics  .  Alcohol use: No    Comment: rare glass of wine  . Drug use: No    Allergies  Allergen Reactions  . Prevpac [Amoxicill-Clarithro-Lansopraz] Other (See Comments)    Throat closed up  . Latex Rash  . Duloxetine Other (See Comments)    Nausea, diarrhea and jittery  . Tramadol     GI symptoms    Current Outpatient Medications  Medication Sig Dispense Refill  . acetaminophen (TYLENOL) 500 MG tablet Take 1,000 mg by mouth every 8 (eight) hours as needed.     [name] albuterol (PROAIR HFA) 108 (90 Base) MCG/ACT inhaler INHALE 2 PUFFS INTO THE LUNGS EVERY 6 (SIX) HOURS AS NEEDED FOR WHEEZING. (Patient taking differently: Inhale 2 puffs into the lungs every 6 (six) hours as needed for wheezing. ) 8.5 each 1  . apixaban (ELIQUIS) 5 MG TABS tablet Take 2 tablets (10mg ) twice daily for 7 days, then 1 tablet  (5mg ) twice daily 60 tablet 0  . cetirizine (ZYRTEC) 10 MG tablet Take 10 mg by mouth at bedtime.     . cholecalciferol (VITAMIN D) 1000 units tablet Take 1,000 Units by mouth daily.    [name] docusate sodium (STOOL SOFTENER) 100 MG capsule Take 100 mg by mouth daily as needed.     . Glucos-Chondroit-Hyaluron-MSM (GLUCOSAMINE CHONDROITIN JOINT) TABS Take 2 tablets by mouth daily.    [name] LORazepam (ATIVAN) 0.5 MG tablet TAKE 1/2-1 TABLET BY MOUTH AT BEDTIME 30 tablet 0  . niacin 500 MG tablet Take 500 mg by mouth at bedtime.    . pantoprazole (PROTONIX) 40 MG tablet TAKE 1 TABLET (40 MG TOTAL) BY MOUTH 2 (TWO) TIMES DAILY. (Patient taking differently: 40 mg daily. ) 180 tablet 2  . polyethylene glycol (MIRALAX / GLYCOLAX) packet Take 17 g by mouth daily as needed for moderate constipation.     . tizanidine (ZANAFLEX) 2 MG capsule Take 1 capsule (2 mg total) by mouth 3 (three) times daily as needed for muscle spasms. 30 capsule 3  . TURMERIC PO Take by mouth.     No current facility-administered medications for this visit.      REVIEW OF SYSTEMS (Negative unless checked)  Constitutional: [] Weight loss  [] Fever  [] Chills Cardiac: [] Chest pain   [] Chest pressure   [] Palpitations   [] Shortness of breath when laying flat   [] Shortness of breath at rest   [] Shortness of breath with exertion. Vascular:  [x] Pain in legs with walking   [x] Pain in legs at rest   [] Pain in legs when laying flat   [] Claudication   [] Pain in feet when walking  [] Pain in feet at rest  [] Pain in feet when laying flat   [x] History of DVT   [x] Phlebitis   [x] Swelling in legs   [] Varicose veins   [] Non-healing ulcers Pulmonary:   [] Uses home oxygen   [] Productive cough   [] Hemoptysis   [] Wheeze  [] COPD   [] Asthma Neurologic:  [] Dizziness  [] Blackouts   [] Seizures   [] History of stroke   [] History of TIA  [] Aphasia   [] Temporary blindness   [] Dysphagia   [] Weakness or numbness in arms   [] Weakness or numbness in legs Musculoskeletal:   [x] Arthritis   [] Joint swelling   [x] Joint pain   [] Low back pain Hematologic:  [] Easy bruising  [] Easy bleeding   [] Hypercoagulable state   [x] Anemic  [] Hepatitis Gastrointestinal:  [] Blood in stool   [] Vomiting blood  [x] Gastroesophageal reflux/heartburn   [] Abdominal pain Genitourinary:  [] Chronic kidney disease   [] Difficult urination  []   Frequent urination  [] Burning with urination   [] Hematuria Skin:  [] Rashes   [] Ulcers   [] Wounds Psychological:  [] History of anxiety   []  History of major depression.    Physical Exam BP (!) 156/77   Pulse 76   Ht 5\' 5"  (1.651 m)   Wt 239 lb (108.4 kg)   LMP  (LMP Unknown)   BMI 39.77 kg/m  Gen:  WD/WN, NAD Head: [place]/AT, No temporalis wasting.  Ear/Nose/Throat: Hearing grossly intact, nares w/o erythema or drainage, oropharynx w/o Erythema/Exudate Eyes: Conjunctiva clear, sclera non-icteric  Neck: trachea midline.  No JVD.  Pulmonary:  Good air movement, respirations not labored, no use of accessory muscles  Cardiac: RRR, no JVD Vascular:  Vessel Right Left  Radial Palpable Palpable                                   Gastrointestinal:. No masses, surgical incisions, or scars. Musculoskeletal: M/S 5/5 throughout.  Extremities without ischemic changes.  No deformity or atrophy.  No significant right lower extremity edema, 2-3+ left lower extremity edema.  Stasis changes and woody feeling of the lower left leg which she says is worse than her baseline Neurologic: Sensation grossly intact in extremities.  Symmetrical.  Speech is fluent. Motor exam as listed above. Psychiatric: Judgment intact, Mood & affect appropriate for pt's clinical situation. Dermatologic: No rashes or ulcers noted.  No cellulitis or open wounds.    Radiology CT Angio Chest PE W/Cm &/Or Wo Cm  Result Date: 08/18/2020 CLINICAL DATA:  Left lower extremity DVT. Left leg swelling and tightness. EXAM: CT ANGIOGRAPHY CHEST WITH CONTRAST TECHNIQUE: Multidetector CT imaging  of the chest was performed using the standard protocol during bolus administration of intravenous contrast. Multiplanar CT image reconstructions and MIPs were obtained to evaluate the vascular anatomy. CONTRAST:  25mL OMNIPAQUE IOHEXOL 350 MG/ML SOLN COMPARISON:  Chest radiograph 06/15/2018 FINDINGS: Cardiovascular: Web-like filling defect in the right lower lobe pulmonary artery shown on images 140-149 of series 7 compatible with chronic pulmonary embolus. No central filling defect in the pulmonary arterial tree is identified to suggest acute pulmonary embolus Mediastinum/Nodes: Large hiatal hernia containing most of the stomach. Lungs/Pleura: Fat density subpleural nodularity just deep to the right anterior second rib on image 26/8, felt to be benign. Nonspecific 3 mm right lower lobe nodule along the major fissure, image 68/8. Nonspecific 3 mm nodule along the right hemidiaphragm, image 70/8. Mild atelectasis medially in the right lower lobe and left lower lobe compatible with passive atelectasis related to mass effect from the large hiatal hernia. Linear subsegmental atelectasis or scarring in the lingula and anteriorly in the left lower lobe. Upper Abdomen: Lobulated hypodense hepatic lesion in the left hepatic lobe measuring about 4.0 by 2.1 by 2.4 cm, fluid density. Common hepatic duct 1 cm in diameter, probably a physiologic response to the patient's reported cholecystectomy. There is potential wall thickening in the stomach antrum (which is intra-abdominal), for example on image 91/6, this is nonspecific and incompletely characterized on today's CT chest. Musculoskeletal: Right pre acromial os acromiale. Left acromion not fully included on imaging. Mild thoracic spondylosis. Review of the MIP images confirms the above findings. IMPRESSION: 1. Web-like filling defect in the right lower lobe pulmonary artery compatible with chronic pulmonary embolus. No central filling defect is identified to suggest acute  pulmonary embolus. 2. Large hiatal hernia containing most of the stomach. 3. Questionable wall thickening in  the stomach antrum which is intra-abdominal, this is nonspecific and incompletely characterized on today's CT chest. If the patient has evidence of occult GI bleeding then upper endoscopy might be considered for further characterization. 4. Right pre acromial os acromiale. 5. Two right lower lobe nodules are each about 3 mm in diameter. No follow-up needed if patient is low-risk (and has no known or suspected primary neoplasm). Non-contrast chest CT can be considered in 12 months if patient is high-risk. This recommendation follows the consensus statement: Guidelines for Management of Incidental Pulmonary Nodules Detected on CT Images: From the Fleischner Society 2017; Radiology 2017; 284:228-243. 6. Lobulated hypodense lesion in the left hepatic lobe, likely a cyst. 7. Mild atelectasis medially in the right lower lobe and left lower lobe compatible with passive atelectasis related to mass effect from the large hiatal hernia. Electronically Signed   By: Van Clines M.D.   On: 08/18/2020 13:33   US Venous Img Lower Unilateral Left  Result Date: 08/18/2020 CLINICAL DATA:  Left lower extremity pain and edema. Evaluate for DVT. EXAM: LEFT LOWER EXTREMITY VENOUS DOPPLER ULTRASOUND TECHNIQUE: Gray-scale sonography with graded compression, as well as color Doppler and duplex ultrasound were performed to evaluate the lower extremity deep venous systems from the level of the common femoral vein and including the common femoral, femoral, profunda femoral, popliteal and calf veins including the posterior tibial, peroneal and gastrocnemius veins when visible. The superficial great saphenous vein was also interrogated. Spectral Doppler was utilized to evaluate flow at rest and with distal augmentation maneuvers in the common femoral, femoral and popliteal veins. COMPARISON:  None. FINDINGS: Contralateral Common  Femoral Vein: Respiratory phasicity is normal and symmetric with the symptomatic side. No evidence of thrombus. Normal compressibility. There is hypoechoic near occlusive thrombus involving the left common femoral vein (image 7). There is hypoechoic occlusive thrombus seen within the left saphenofemoral junction (image 12). The left deep femoral vein appears patent where imaged. There is mixed echogenic near occlusive wall thickening/DVT involving the proximal (image 18), mid (image 20) and distal (image 23) aspects of the left femoral vein. The left femoral vein appears atretic throughout its imaged course. There is an apparent intimal web involving the left popliteal vein with associated wall thickening involving the proximal aspect of the left popliteal vein (image 26). The distal aspect the left popliteal vein appears widely patent. There is hypoechoic occlusive thrombus involving the left lesser saphenous vein. The tibial veins are suboptimally visualized. Other Findings:  None. IMPRESSION: The examination is positive for age-indeterminate near occlusive DVT extending from the left common femoral vein through the left popliteal vein. While a majority of the thrombus appears chronic in etiology, in the absence of prior examinations, an acute on chronic process is not excluded. Clinical correlation is advised. Electronically Signed   By: Sandi Mariscal M.D.   On: 08/18/2020 09:20    Labs Recent Results (from the past 2160 hour(s))  Vitamin B12     Status: None   Collection Time: 05/29/20 12:38 PM  Result Value Ref Range   Vitamin B-12 218 180 - 914 pg/mL    Comment: (NOTE) This assay is not validated for testing neonatal or myeloproliferative syndrome specimens for Vitamin B12 levels. Performed at [facility], [place] [address] [place], [place] [address]   Ferritin     Status: None   Collection Time: 05/29/20 12:39 PM  Result Value Ref Range   Ferritin 12 11 - 307 ng/mL    Comment: Performed  at [facility], [address] [place], [place] [address]  CBC with Differential/Platelet     Status: None   Collection Time: 05/29/20 12:39 PM  Result Value Ref Range   WBC 6.2 4.0 - 10.5 K/uL   RBC 4.82 3.87 - 5.11 MIL/uL   Hemoglobin 14.2 12.0 - 15.0 g/dL   HCT 43.1 36 - 46 %   MCV 89.4 80.0 - 100.0 fL   MCH 29.5 26.0 - 34.0 pg   MCHC 32.9 30.0 - 36.0 g/dL   RDW 12.7 11.5 - 15.5 %   Platelets 265 150 - 400 K/uL   nRBC 0.0 0.0 - 0.2 %   Neutrophils Relative % 54 %   Neutro Abs 3.3 1.7 - 7.7 K/uL   Lymphocytes Relative 28 %   Lymphs Abs 1.7 0.7 - 4.0 K/uL   Monocytes Relative 13 %   Monocytes Absolute 0.8 0 - 1 K/uL   Eosinophils Relative 4 %   Eosinophils Absolute 0.2 0 - 0 K/uL   Basophils Relative 1 %   Basophils Absolute 0.1 0 - 0 K/uL   Immature Granulocytes 0 %   Abs Immature Granulocytes 0.02 0.00 - 0.07 K/uL    Comment: Performed at [facility], [address] [place], [place] [address]  Iron and TIBC     Status: None   Collection Time: 05/29/20 12:39 PM  Result Value Ref Range   Iron 77 28 - 170 ug/dL   TIBC 428 250 - 450 ug/dL   Saturation Ratios 18 10.4 - 31.8 %   UIBC 351 ug/dL    Comment: Performed at [facility], [address] [place], [place] [address]  Comprehensive metabolic panel     Status: Abnormal   Collection Time: 08/02/20  3:19 PM  Result Value Ref Range   Sodium 142 135 - 145 mEq/L   Potassium 4.6 3.5 - 5.1 mEq/L   Chloride 107 96 - 112 mEq/L   CO2 26 19 - 32 mEq/L   Glucose, Bld 95 70 - 99 mg/dL   BUN 19 6 - 23 mg/dL   Creatinine, Ser 1.00 0.40 - 1.20 mg/dL   Total Bilirubin 0.8 0.2 - 1.2 mg/dL   Alkaline Phosphatase 131 (H) 39 - 117 U/L   AST 15 0 - 37 U/L   ALT 13 0 - 35 U/L   Total Protein 7.0 6.0 - 8.3 g/dL   Albumin 4.0 3.5 - 5.2 g/dL   GFR 53.93 (L) >60.00 mL/min   Calcium 10.0 8.4 - 10.5 mg/dL  T4, free     Status: None   Collection Time: 08/02/20  3:19 PM  Result Value Ref Range   Free T4 0.84  0.60 - 1.60 ng/dL    Comment: Specimens from patients who are undergoing biotin therapy and /or ingesting biotin supplements may contain high levels of biotin.  The higher biotin concentration in these specimens interferes with this Free T4 assay.  Specimens that contain high levels  of biotin may cause false high results for this Free T4 assay.  Please interpret results in light of the total clinical presentation of the patient.    CBC with Differential     Status: None   Collection Time: 08/18/20 11:45 AM  Result Value Ref Range   WBC 6.5 4.0 - 10.5 K/uL   RBC 4.31 3.87 - 5.11 MIL/uL   Hemoglobin 12.7 12.0 - 15.0 g/dL   HCT 38.9 36 - 46 %   MCV 90.3 80.0 -  100.0 fL   MCH 29.5 26.0 - 34.0 pg   MCHC 32.6 30.0 - 36.0 g/dL   RDW 13.4 11.5 - 15.5 %   Platelets 215 150 - 400 K/uL   nRBC 0.0 0.0 - 0.2 %   Neutrophils Relative % 56 %   Neutro Abs 3.7 1.7 - 7.7 K/uL   Lymphocytes Relative 28 %   Lymphs Abs 1.8 0.7 - 4.0 K/uL   Monocytes Relative 10 %   Monocytes Absolute 0.7 0 - 1 K/uL   Eosinophils Relative 4 %   Eosinophils Absolute 0.3 0 - 0 K/uL   Basophils Relative 1 %   Basophils Absolute 0.1 0 - 0 K/uL   Immature Granulocytes 1 %   Abs Immature Granulocytes 0.03 0.00 - 0.07 K/uL    Comment: Performed at [facility], [address], [place], [place] [address]  Basic metabolic panel     Status: Abnormal   Collection Time: 08/18/20 11:45 AM  Result Value Ref Range   Sodium 140 135 - 145 mmol/L   Potassium 4.3 3.5 - 5.1 mmol/L   Chloride 107 98 - 111 mmol/L   CO2 25 22 - 32 mmol/L   Glucose, Bld 96 70 - 99 mg/dL    Comment: Glucose reference range applies only to samples taken after fasting for at least 8 hours.   BUN 24 (H) 8 - 23 mg/dL   Creatinine, Ser 0.90 0.44 - 1.00 mg/dL   Calcium 9.4 8.9 - 10.3 mg/dL   GFR calc non Af Amer >60 >60 mL/min   GFR calc Af Amer >60 >60 mL/min   Anion gap 8 5 - 15    Comment: Performed at [facility], [place], [place] [address]  Troponin I (High Sensitivity)     Status: None   Collection Time: 08/18/20 11:45 AM  Result Value Ref Range   Troponin I (High Sensitivity) 7 <18 ng/L    Comment: (NOTE) Elevated high sensitivity troponin I (hsTnI) values and significant  changes across serial measurements may suggest ACS but many other  chronic and acute conditions are known to elevate hsTnI results.  Refer to the "Links" section for chest pain algorithms and additional  guidance. Performed at [facility], [address] [place], [place] [address]   Brain natriuretic peptide     Status: None   Collection Time: 08/18/20 11:45 AM  Result Value Ref Range   B Natriuretic Peptide 59.6 0.0 - 100.0 pg/mL    Comment: Performed at [facility], [place], [place] [address]  Troponin I (High Sensitivity)     Status: None   Collection Time: 08/18/20  1:36 PM  Result Value Ref Range   Troponin I (High Sensitivity) 6 <18 ng/L    Comment: (NOTE) Elevated high sensitivity troponin I (hsTnI) values and significant  changes across serial measurements may suggest ACS but many other  chronic and acute conditions are known to elevate hsTnI results.  Refer to the "Links" section for chest pain algorithms and additional  guidance. Performed at [facility], [address] [place], [place] [address]     Assessment/Plan:  Hyperlipidemia lipid control important in reducing the progression of atherosclerotic disease. Continue statin therapy   Chronic pulmonary embolism (HCC) CT shows evidence of a chronic pulmonary embolus but no large acute PE or anything requiring invasive treatment.  Now on anticoagulation  DVT (deep venous thrombosis) (HCC) The patient has extensive left lower  extremity DVT.  Although there is definitely a chronic component part of this, there is also an acute component which would correlate with her new symptoms.  We discussed options  today including continued anticoagulation versus mechanical thrombectomy in addition to anticoagulation.  She wants to go home and think about this, but is leaning towards mechanical thrombectomy of the left leg DVT.  Her pulmonary embolus does not require any therapy.  No need to stop anticoagulation if she decides to have this done either.  She is going to go home and think about this and we told her that time is somewhat of the essence and we would like to get that done within the next week if she chooses to have it done      Leotis Pain 08/21/2020, 4:43 PM   This note was created with Dragon medical transcription system.  Any errors from dictation are unintentional.

## 2020-08-21 NOTE — H&P (View-Only) (Signed)
Patient ID: Alice Donaldson, female   DOB: Oct 05, 1946, 74 y.o.   MRN: [id number]  Chief Complaint  Patient presents with  . New Patient (Initial Visit)    [place] per Allayne Stack     HPI Alice Donaldson is a 74 y.o. female.  I am asked to see the patient by Dr. Charna Archer at the [facility] ER for evaluation of LLE DVT and chronic PE.  About 10 years ago, the patient had a left lower extremity DVT treated with anticoagulation which has resulted in some chronic pain and swelling in the left leg.  About 5 days ago, she began having markedly increased pain and swelling in the left leg.  She went to the emergency room for a work-up over the weekend which demonstrated acute and chronic DVT extending up to the left common femoral vein as well as what appeared to be a chronic pulmonary embolus on the CT scan of the chest.  She was started on Eliquis 10 mg twice daily.  She has not felt well on the Eliquis, but has tolerated this with no signs of bleeding.  No chest pain or shortness of breath.  Her leg swelling has gone down a little bit of her remains prominent.  Her pain is still there as well.  She is referred for further evaluation and potential invasive treatment of her left leg DVT.     Past Medical History:  Diagnosis Date  . Allergic rhinitis, cause unspecified   . Allergy   . Anemia   . Complication of anesthesia   . Diverticulosis   . DVT (deep venous thrombosis) ([place])   . Gastric ulcer    in past  . GERD (gastroesophageal reflux disease)   . Hemorrhoids, internal   . Hyperlipidemia   . Osteoarthrosis involving, or with mention of more than one site, but not specified as generalized, multiple sites   . PONV (postoperative nausea and vomiting)   . Ulcer (traumatic) of oral mucosa   . Unspecified venous (peripheral) insufficiency     Past Surgical History:  Procedure Laterality Date  . ABDOMINAL HYSTERECTOMY  1996  . BREAST BIOPSY  2003  . CHOLECYSTECTOMY  2000  . COLONOSCOPY WITH PROPOFOL  N/A 10/02/2015   Procedure: COLONOSCOPY WITH PROPOFOL;  Surgeon: Lollie Sails, MD;  Location: [facility] ENDOSCOPY;  Service: Endoscopy;  Laterality: N/A;  . COLONOSCOPY WITH PROPOFOL N/A 06/17/2018   Procedure: COLONOSCOPY WITH PROPOFOL;  Surgeon: Toledo, Benay Pike, MD;  Location: [facility] ENDOSCOPY;  Service: Gastroenterology;  Laterality: N/A;  . DEXA  5/14   Normal  . ESOPHAGOGASTRODUODENOSCOPY (EGD) WITH PROPOFOL N/A 06/17/2018   Procedure: ESOPHAGOGASTRODUODENOSCOPY (EGD) WITH PROPOFOL;  Surgeon: Toledo, Benay Pike, MD;  Location: [facility] ENDOSCOPY;  Service: Gastroenterology;  Laterality: N/A;  . KNEE ARTHROSCOPY     right in 2007, left in 2011  . TONSILLECTOMY AND ADENOIDECTOMY  childhood     Family History  Problem Relation Age of Onset  . Heart disease Mother   . Heart disease Father   . Diabetes Son 5       Type 1  . Hypertension Son   . Stroke Brother   . Diabetes Other 2       type 1  . Hyperlipidemia Neg Hx   . Depression Neg Hx      Social History   Tobacco Use  . Smoking status: Former Smoker    Types: Cigarettes  . Smokeless tobacco: Never Used  Substance Use Topics  .  Alcohol use: No    Comment: rare glass of wine  . Drug use: No    Allergies  Allergen Reactions  . Prevpac [Amoxicill-Clarithro-Lansopraz] Other (See Comments)    Throat closed up  . Latex Rash  . Duloxetine Other (See Comments)    Nausea, diarrhea and jittery  . Tramadol     GI symptoms    Current Outpatient Medications  Medication Sig Dispense Refill  . acetaminophen (TYLENOL) 500 MG tablet Take 1,000 mg by mouth every 8 (eight) hours as needed.     [name] albuterol (PROAIR HFA) 108 (90 Base) MCG/ACT inhaler INHALE 2 PUFFS INTO THE LUNGS EVERY 6 (SIX) HOURS AS NEEDED FOR WHEEZING. (Patient taking differently: Inhale 2 puffs into the lungs every 6 (six) hours as needed for wheezing. ) 8.5 each 1  . apixaban (ELIQUIS) 5 MG TABS tablet Take 2 tablets (10mg ) twice daily for 7 days, then 1 tablet  (5mg ) twice daily 60 tablet 0  . cetirizine (ZYRTEC) 10 MG tablet Take 10 mg by mouth at bedtime.     . cholecalciferol (VITAMIN D) 1000 units tablet Take 1,000 Units by mouth daily.    [name] docusate sodium (STOOL SOFTENER) 100 MG capsule Take 100 mg by mouth daily as needed.     . Glucos-Chondroit-Hyaluron-MSM (GLUCOSAMINE CHONDROITIN JOINT) TABS Take 2 tablets by mouth daily.    [name] LORazepam (ATIVAN) 0.5 MG tablet TAKE 1/2-1 TABLET BY MOUTH AT BEDTIME 30 tablet 0  . niacin 500 MG tablet Take 500 mg by mouth at bedtime.    . pantoprazole (PROTONIX) 40 MG tablet TAKE 1 TABLET (40 MG TOTAL) BY MOUTH 2 (TWO) TIMES DAILY. (Patient taking differently: 40 mg daily. ) 180 tablet 2  . polyethylene glycol (MIRALAX / GLYCOLAX) packet Take 17 g by mouth daily as needed for moderate constipation.     . tizanidine (ZANAFLEX) 2 MG capsule Take 1 capsule (2 mg total) by mouth 3 (three) times daily as needed for muscle spasms. 30 capsule 3  . TURMERIC PO Take by mouth.     No current facility-administered medications for this visit.      REVIEW OF SYSTEMS (Negative unless checked)  Constitutional: [] Weight loss  [] Fever  [] Chills Cardiac: [] Chest pain   [] Chest pressure   [] Palpitations   [] Shortness of breath when laying flat   [] Shortness of breath at rest   [] Shortness of breath with exertion. Vascular:  [x] Pain in legs with walking   [x] Pain in legs at rest   [] Pain in legs when laying flat   [] Claudication   [] Pain in feet when walking  [] Pain in feet at rest  [] Pain in feet when laying flat   [x] History of DVT   [x] Phlebitis   [x] Swelling in legs   [] Varicose veins   [] Non-healing ulcers Pulmonary:   [] Uses home oxygen   [] Productive cough   [] Hemoptysis   [] Wheeze  [] COPD   [] Asthma Neurologic:  [] Dizziness  [] Blackouts   [] Seizures   [] History of stroke   [] History of TIA  [] Aphasia   [] Temporary blindness   [] Dysphagia   [] Weakness or numbness in arms   [] Weakness or numbness in legs Musculoskeletal:   [x] Arthritis   [] Joint swelling   [x] Joint pain   [] Low back pain Hematologic:  [] Easy bruising  [] Easy bleeding   [] Hypercoagulable state   [x] Anemic  [] Hepatitis Gastrointestinal:  [] Blood in stool   [] Vomiting blood  [x] Gastroesophageal reflux/heartburn   [] Abdominal pain Genitourinary:  [] Chronic kidney disease   [] Difficult urination  []   Frequent urination  [] Burning with urination   [] Hematuria Skin:  [] Rashes   [] Ulcers   [] Wounds Psychological:  [] History of anxiety   []  History of major depression.    Physical Exam BP (!) 156/77   Pulse 76   Ht 5\' 5"  (1.651 m)   Wt 239 lb (108.4 kg)   LMP  (LMP Unknown)   BMI 39.77 kg/m  Gen:  WD/WN, NAD Head: [place]/AT, No temporalis wasting.  Ear/Nose/Throat: Hearing grossly intact, nares w/o erythema or drainage, oropharynx w/o Erythema/Exudate Eyes: Conjunctiva clear, sclera non-icteric  Neck: trachea midline.  No JVD.  Pulmonary:  Good air movement, respirations not labored, no use of accessory muscles  Cardiac: RRR, no JVD Vascular:  Vessel Right Left  Radial Palpable Palpable                                   Gastrointestinal:. No masses, surgical incisions, or scars. Musculoskeletal: M/S 5/5 throughout.  Extremities without ischemic changes.  No deformity or atrophy.  No significant right lower extremity edema, 2-3+ left lower extremity edema.  Stasis changes and woody feeling of the lower left leg which she says is worse than her baseline Neurologic: Sensation grossly intact in extremities.  Symmetrical.  Speech is fluent. Motor exam as listed above. Psychiatric: Judgment intact, Mood & affect appropriate for pt's clinical situation. Dermatologic: No rashes or ulcers noted.  No cellulitis or open wounds.    Radiology CT Angio Chest PE W/Cm &/Or Wo Cm  Result Date: 08/18/2020 CLINICAL DATA:  Left lower extremity DVT. Left leg swelling and tightness. EXAM: CT ANGIOGRAPHY CHEST WITH CONTRAST TECHNIQUE: Multidetector CT imaging  of the chest was performed using the standard protocol during bolus administration of intravenous contrast. Multiplanar CT image reconstructions and MIPs were obtained to evaluate the vascular anatomy. CONTRAST:  43mL OMNIPAQUE IOHEXOL 350 MG/ML SOLN COMPARISON:  Chest radiograph 06/15/2018 FINDINGS: Cardiovascular: Web-like filling defect in the right lower lobe pulmonary artery shown on images 140-149 of series 7 compatible with chronic pulmonary embolus. No central filling defect in the pulmonary arterial tree is identified to suggest acute pulmonary embolus Mediastinum/Nodes: Large hiatal hernia containing most of the stomach. Lungs/Pleura: Fat density subpleural nodularity just deep to the right anterior second rib on image 26/8, felt to be benign. Nonspecific 3 mm right lower lobe nodule along the major fissure, image 68/8. Nonspecific 3 mm nodule along the right hemidiaphragm, image 70/8. Mild atelectasis medially in the right lower lobe and left lower lobe compatible with passive atelectasis related to mass effect from the large hiatal hernia. Linear subsegmental atelectasis or scarring in the lingula and anteriorly in the left lower lobe. Upper Abdomen: Lobulated hypodense hepatic lesion in the left hepatic lobe measuring about 4.0 by 2.1 by 2.4 cm, fluid density. Common hepatic duct 1 cm in diameter, probably a physiologic response to the patient's reported cholecystectomy. There is potential wall thickening in the stomach antrum (which is intra-abdominal), for example on image 91/6, this is nonspecific and incompletely characterized on today's CT chest. Musculoskeletal: Right pre acromial os acromiale. Left acromion not fully included on imaging. Mild thoracic spondylosis. Review of the MIP images confirms the above findings. IMPRESSION: 1. Web-like filling defect in the right lower lobe pulmonary artery compatible with chronic pulmonary embolus. No central filling defect is identified to suggest acute  pulmonary embolus. 2. Large hiatal hernia containing most of the stomach. 3. Questionable wall thickening in  the stomach antrum which is intra-abdominal, this is nonspecific and incompletely characterized on today's CT chest. If the patient has evidence of occult GI bleeding then upper endoscopy might be considered for further characterization. 4. Right pre acromial os acromiale. 5. Two right lower lobe nodules are each about 3 mm in diameter. No follow-up needed if patient is low-risk (and has no known or suspected primary neoplasm). Non-contrast chest CT can be considered in 12 months if patient is high-risk. This recommendation follows the consensus statement: Guidelines for Management of Incidental Pulmonary Nodules Detected on CT Images: From the Fleischner Society 2017; Radiology 2017; 284:228-243. 6. Lobulated hypodense lesion in the left hepatic lobe, likely a cyst. 7. Mild atelectasis medially in the right lower lobe and left lower lobe compatible with passive atelectasis related to mass effect from the large hiatal hernia. Electronically Signed   By: Van Clines M.D.   On: 08/18/2020 13:33   US Venous Img Lower Unilateral Left  Result Date: 08/18/2020 CLINICAL DATA:  Left lower extremity pain and edema. Evaluate for DVT. EXAM: LEFT LOWER EXTREMITY VENOUS DOPPLER ULTRASOUND TECHNIQUE: Gray-scale sonography with graded compression, as well as color Doppler and duplex ultrasound were performed to evaluate the lower extremity deep venous systems from the level of the common femoral vein and including the common femoral, femoral, profunda femoral, popliteal and calf veins including the posterior tibial, peroneal and gastrocnemius veins when visible. The superficial great saphenous vein was also interrogated. Spectral Doppler was utilized to evaluate flow at rest and with distal augmentation maneuvers in the common femoral, femoral and popliteal veins. COMPARISON:  None. FINDINGS: Contralateral Common  Femoral Vein: Respiratory phasicity is normal and symmetric with the symptomatic side. No evidence of thrombus. Normal compressibility. There is hypoechoic near occlusive thrombus involving the left common femoral vein (image 7). There is hypoechoic occlusive thrombus seen within the left saphenofemoral junction (image 12). The left deep femoral vein appears patent where imaged. There is mixed echogenic near occlusive wall thickening/DVT involving the proximal (image 18), mid (image 20) and distal (image 23) aspects of the left femoral vein. The left femoral vein appears atretic throughout its imaged course. There is an apparent intimal web involving the left popliteal vein with associated wall thickening involving the proximal aspect of the left popliteal vein (image 26). The distal aspect the left popliteal vein appears widely patent. There is hypoechoic occlusive thrombus involving the left lesser saphenous vein. The tibial veins are suboptimally visualized. Other Findings:  None. IMPRESSION: The examination is positive for age-indeterminate near occlusive DVT extending from the left common femoral vein through the left popliteal vein. While a majority of the thrombus appears chronic in etiology, in the absence of prior examinations, an acute on chronic process is not excluded. Clinical correlation is advised. Electronically Signed   By: Sandi Mariscal M.D.   On: 08/18/2020 09:20    Labs Recent Results (from the past 2160 hour(s))  Vitamin B12     Status: None   Collection Time: 05/29/20 12:38 PM  Result Value Ref Range   Vitamin B-12 218 180 - 914 pg/mL    Comment: (NOTE) This assay is not validated for testing neonatal or myeloproliferative syndrome specimens for Vitamin B12 levels. Performed at [facility], [place] [address], [place], [place] [address]   Ferritin     Status: None   Collection Time: 05/29/20 12:39 PM  Result Value Ref Range   Ferritin 12 11 - 307 ng/mL    Comment: Performed  at [facility], [address] [place], [place] [address]  CBC with Differential/Platelet     Status: None   Collection Time: 05/29/20 12:39 PM  Result Value Ref Range   WBC 6.2 4.0 - 10.5 K/uL   RBC 4.82 3.87 - 5.11 MIL/uL   Hemoglobin 14.2 12.0 - 15.0 g/dL   HCT 43.1 36 - 46 %   MCV 89.4 80.0 - 100.0 fL   MCH 29.5 26.0 - 34.0 pg   MCHC 32.9 30.0 - 36.0 g/dL   RDW 12.7 11.5 - 15.5 %   Platelets 265 150 - 400 K/uL   nRBC 0.0 0.0 - 0.2 %   Neutrophils Relative % 54 %   Neutro Abs 3.3 1.7 - 7.7 K/uL   Lymphocytes Relative 28 %   Lymphs Abs 1.7 0.7 - 4.0 K/uL   Monocytes Relative 13 %   Monocytes Absolute 0.8 0 - 1 K/uL   Eosinophils Relative 4 %   Eosinophils Absolute 0.2 0 - 0 K/uL   Basophils Relative 1 %   Basophils Absolute 0.1 0 - 0 K/uL   Immature Granulocytes 0 %   Abs Immature Granulocytes 0.02 0.00 - 0.07 K/uL    Comment: Performed at [facility], [address] [place], [place] [address]  Iron and TIBC     Status: None   Collection Time: 05/29/20 12:39 PM  Result Value Ref Range   Iron 77 28 - 170 ug/dL   TIBC 428 250 - 450 ug/dL   Saturation Ratios 18 10.4 - 31.8 %   UIBC 351 ug/dL    Comment: Performed at [facility], [address] [place],  [address]  Comprehensive metabolic panel     Status: Abnormal   Collection Time: 08/02/20  3:19 PM  Result Value Ref Range   Sodium 142 135 - 145 mEq/L   Potassium 4.6 3.5 - 5.1 mEq/L   Chloride 107 96 - 112 mEq/L   CO2 26 19 - 32 mEq/L   Glucose, Bld 95 70 - 99 mg/dL   BUN 19 6 - 23 mg/dL   Creatinine, Ser 1.00 0.40 - 1.20 mg/dL   Total Bilirubin 0.8 0.2 - 1.2 mg/dL   Alkaline Phosphatase 131 (H) 39 - 117 U/L   AST 15 0 - 37 U/L   ALT 13 0 - 35 U/L   Total Protein 7.0 6.0 - 8.3 g/dL   Albumin 4.0 3.5 - 5.2 g/dL   GFR 53.93 (L) >60.00 mL/min   Calcium 10.0 8.4 - 10.5 mg/dL  T4, free     Status: None   Collection Time: 08/02/20  3:19 PM  Result Value Ref Range   Free T4 0.84  0.60 - 1.60 ng/dL    Comment: Specimens from patients who are undergoing biotin therapy and /or ingesting biotin supplements may contain high levels of biotin.  The higher biotin concentration in these specimens interferes with this Free T4 assay.  Specimens that contain high levels  of biotin may cause false high results for this Free T4 assay.  Please interpret results in light of the total clinical presentation of the patient.    CBC with Differential     Status: None   Collection Time: 08/18/20 11:45 AM  Result Value Ref Range   WBC 6.5 4.0 - 10.5 K/uL   RBC 4.31 3.87 - 5.11 MIL/uL   Hemoglobin 12.7 12.0 - 15.0 g/dL   HCT 38.9 36 - 46 %   MCV 90.3 80.0 -  100.0 fL   MCH 29.5 26.0 - 34.0 pg   MCHC 32.6 30.0 - 36.0 g/dL   RDW 13.4 11.5 - 15.5 %   Platelets 215 150 - 400 K/uL   nRBC 0.0 0.0 - 0.2 %   Neutrophils Relative % 56 %   Neutro Abs 3.7 1.7 - 7.7 K/uL   Lymphocytes Relative 28 %   Lymphs Abs 1.8 0.7 - 4.0 K/uL   Monocytes Relative 10 %   Monocytes Absolute 0.7 0 - 1 K/uL   Eosinophils Relative 4 %   Eosinophils Absolute 0.3 0 - 0 K/uL   Basophils Relative 1 %   Basophils Absolute 0.1 0 - 0 K/uL   Immature Granulocytes 1 %   Abs Immature Granulocytes 0.03 0.00 - 0.07 K/uL    Comment: Performed at [facility], [address], [place], [place] [address]  Basic metabolic panel     Status: Abnormal   Collection Time: 08/18/20 11:45 AM  Result Value Ref Range   Sodium 140 135 - 145 mmol/L   Potassium 4.3 3.5 - 5.1 mmol/L   Chloride 107 98 - 111 mmol/L   CO2 25 22 - 32 mmol/L   Glucose, Bld 96 70 - 99 mg/dL    Comment: Glucose reference range applies only to samples taken after fasting for at least 8 hours.   BUN 24 (H) 8 - 23 mg/dL   Creatinine, Ser 0.90 0.44 - 1.00 mg/dL   Calcium 9.4 8.9 - 10.3 mg/dL   GFR calc non Af Amer >60 >60 mL/min   GFR calc Af Amer >60 >60 mL/min   Anion gap 8 5 - 15    Comment: Performed at [facility], [place], [place] [address]  Troponin I (High Sensitivity)     Status: None   Collection Time: 08/18/20 11:45 AM  Result Value Ref Range   Troponin I (High Sensitivity) 7 <18 ng/L    Comment: (NOTE) Elevated high sensitivity troponin I (hsTnI) values and significant  changes across serial measurements may suggest ACS but many other  chronic and acute conditions are known to elevate hsTnI results.  Refer to the "Links" section for chest pain algorithms and additional  guidance. Performed at [facility], [address] [place], [place] [address]   Brain natriuretic peptide     Status: None   Collection Time: 08/18/20 11:45 AM  Result Value Ref Range   B Natriuretic Peptide 59.6 0.0 - 100.0 pg/mL    Comment: Performed at [facility], [place], [place] [address]  Troponin I (High Sensitivity)     Status: None   Collection Time: 08/18/20  1:36 PM  Result Value Ref Range   Troponin I (High Sensitivity) 6 <18 ng/L    Comment: (NOTE) Elevated high sensitivity troponin I (hsTnI) values and significant  changes across serial measurements may suggest ACS but many other  chronic and acute conditions are known to elevate hsTnI results.  Refer to the "Links" section for chest pain algorithms and additional  guidance. Performed at [facility], [address] [place], [place] [address]     Assessment/Plan:  Hyperlipidemia lipid control important in reducing the progression of atherosclerotic disease. Continue statin therapy   Chronic pulmonary embolism (HCC) CT shows evidence of a chronic pulmonary embolus but no large acute PE or anything requiring invasive treatment.  Now on anticoagulation  DVT (deep venous thrombosis) (HCC) The patient has extensive left lower  extremity DVT.  Although there is definitely a chronic component part of this, there is also an acute component which would correlate with her new symptoms.  We discussed options  today including continued anticoagulation versus mechanical thrombectomy in addition to anticoagulation.  She wants to go home and think about this, but is leaning towards mechanical thrombectomy of the left leg DVT.  Her pulmonary embolus does not require any therapy.  No need to stop anticoagulation if she decides to have this done either.  She is going to go home and think about this and we told her that time is somewhat of the essence and we would like to get that done within the next week if she chooses to have it done      Leotis Pain 08/21/2020, 4:43 PM   This note was created with Dragon medical transcription system.  Any errors from dictation are unintentional.

## 2020-08-21 NOTE — Assessment & Plan Note (Signed)
CT shows evidence of a chronic pulmonary embolus but no large acute PE or anything requiring invasive treatment.  Now on anticoagulation

## 2020-08-22 ENCOUNTER — Telehealth (INDEPENDENT_AMBULATORY_CARE_PROVIDER_SITE_OTHER): Payer: Self-pay

## 2020-08-22 NOTE — Telephone Encounter (Signed)
I spoke to patient and she said she just woke up and she would call back to schedule appointment.

## 2020-08-22 NOTE — Telephone Encounter (Signed)
Pt called back and scheduled for 09/17/20 @ 2:15pm

## 2020-08-22 NOTE — Telephone Encounter (Signed)
Spoke with the patient and she is scheduled with Dr. Lucky Cowboy for a left leg thrombectomy on 08/27/20 with a 12:15 pm arrival time to the [facility]. Covid testing on 08/23/20 between 8-1 pm at the [facility]. Pre-procedure instructions were discussed and will be mailed.

## 2020-08-23 ENCOUNTER — Encounter (INDEPENDENT_AMBULATORY_CARE_PROVIDER_SITE_OTHER): Payer: Self-pay

## 2020-08-23 ENCOUNTER — Other Ambulatory Visit: Payer: Self-pay

## 2020-08-23 ENCOUNTER — Other Ambulatory Visit
Admission: RE | Admit: 2020-08-23 | Discharge: 2020-08-23 | Disposition: A | Discharge status: Home / Self Care | Payer: PPO | Source: Ambulatory Visit | Attending: Vascular Surgery | Admitting: Vascular Surgery

## 2020-08-23 DIAGNOSIS — Z20822 Contact with and (suspected) exposure to covid-19: Secondary | ICD-10-CM | POA: Diagnosis not present

## 2020-08-23 DIAGNOSIS — Z01812 Encounter for preprocedural laboratory examination: Secondary | ICD-10-CM | POA: Diagnosis not present

## 2020-08-24 ENCOUNTER — Telehealth: Payer: Self-pay | Admitting: *Deleted

## 2020-08-24 ENCOUNTER — Inpatient Hospital Stay: Payer: PPO | Attending: Oncology

## 2020-08-24 DIAGNOSIS — E538 Deficiency of other specified B group vitamins: Secondary | ICD-10-CM | POA: Insufficient documentation

## 2020-08-24 DIAGNOSIS — D509 Iron deficiency anemia, unspecified: Secondary | ICD-10-CM | POA: Insufficient documentation

## 2020-08-24 LAB — IRON AND TIBC
Iron: 41 ug/dL (ref 28–170)
Saturation Ratios: 11 % (ref 10.4–31.8)
TIBC: 388 ug/dL (ref 250–450)
UIBC: 347 ug/dL

## 2020-08-24 LAB — CBC WITH DIFFERENTIAL/PLATELET
Abs Immature Granulocytes: 0.03 10*3/uL (ref 0.00–0.07)
Basophils Absolute: 0.1 10*3/uL (ref 0.0–0.1)
Basophils Relative: 1 %
Eosinophils Absolute: 0.3 10*3/uL (ref 0.0–0.5)
Eosinophils Relative: 5 %
HCT: 37.3 % (ref 36.0–46.0)
Hemoglobin: 12.3 g/dL (ref 12.0–15.0)
Immature Granulocytes: 1 %
Lymphocytes Relative: 30 %
Lymphs Abs: 1.8 10*3/uL (ref 0.7–4.0)
MCH: 29.4 pg (ref 26.0–34.0)
MCHC: 33 g/dL (ref 30.0–36.0)
MCV: 89 fL (ref 80.0–100.0)
Monocytes Absolute: 0.7 10*3/uL (ref 0.1–1.0)
Monocytes Relative: 11 %
Neutro Abs: 3.1 10*3/uL (ref 1.7–7.7)
Neutrophils Relative %: 52 %
Platelets: 285 10*3/uL (ref 150–400)
RBC: 4.19 MIL/uL (ref 3.87–5.11)
RDW: 13.6 % (ref 11.5–15.5)
WBC: 5.8 10*3/uL (ref 4.0–10.5)
nRBC: 0 % (ref 0.0–0.2)

## 2020-08-24 LAB — VITAMIN B12: Vitamin B-12: 168 pg/mL — ABNORMAL LOW (ref 180–914)

## 2020-08-24 LAB — FERRITIN: Ferritin: 11 ng/mL (ref 11–307)

## 2020-08-24 LAB — SARS CORONAVIRUS 2 (TAT 6-24 HRS): SARS Coronavirus 2: NEGATIVE

## 2020-08-24 NOTE — Progress Notes (Signed)
This was addressed by April in our office.

## 2020-08-24 NOTE — Telephone Encounter (Signed)
Spoke with patient regarding referral to the lung nodule clinic. Pt informed that will need follow up imaging in Sept 2022 and that we will give her a call once her appts have been scheduled. Pt instructed to call if has any further questions or concerns. Nothing further needed at this time.

## 2020-08-25 ENCOUNTER — Encounter: Payer: Self-pay | Admitting: Oncology

## 2020-08-25 ENCOUNTER — Telehealth: Payer: Self-pay

## 2020-08-25 NOTE — Telephone Encounter (Signed)
Spoke to pt to see how she was doing. She has an appointment with [place] 09-17-20. Having procedure soon.

## 2020-08-27 ENCOUNTER — Other Ambulatory Visit (INDEPENDENT_AMBULATORY_CARE_PROVIDER_SITE_OTHER): Payer: Self-pay | Admitting: Nurse Practitioner

## 2020-08-27 ENCOUNTER — Ambulatory Visit
Admission: RE | Admit: 2020-08-27 | Discharge: 2020-08-27 | Disposition: A | Discharge status: Home / Self Care | Payer: PPO | Attending: Vascular Surgery | Admitting: Vascular Surgery

## 2020-08-27 ENCOUNTER — Encounter: Payer: Self-pay | Admitting: Vascular Surgery

## 2020-08-27 ENCOUNTER — Encounter
Admission: RE | Disposition: A | Discharge status: Home / Self Care | Payer: Self-pay | Source: Home / Self Care | Attending: Vascular Surgery

## 2020-08-27 DIAGNOSIS — K219 Gastro-esophageal reflux disease without esophagitis: Secondary | ICD-10-CM | POA: Diagnosis not present

## 2020-08-27 DIAGNOSIS — M1990 Unspecified osteoarthritis, unspecified site: Secondary | ICD-10-CM | POA: Diagnosis not present

## 2020-08-27 DIAGNOSIS — Z87891 Personal history of nicotine dependence: Secondary | ICD-10-CM | POA: Diagnosis not present

## 2020-08-27 DIAGNOSIS — Z888 Allergy status to other drugs, medicaments and biological substances status: Secondary | ICD-10-CM | POA: Insufficient documentation

## 2020-08-27 DIAGNOSIS — E785 Hyperlipidemia, unspecified: Secondary | ICD-10-CM | POA: Insufficient documentation

## 2020-08-27 DIAGNOSIS — Z8249 Family history of ischemic heart disease and other diseases of the circulatory system: Secondary | ICD-10-CM | POA: Insufficient documentation

## 2020-08-27 DIAGNOSIS — I82402 Acute embolism and thrombosis of unspecified deep veins of left lower extremity: Secondary | ICD-10-CM | POA: Insufficient documentation

## 2020-08-27 DIAGNOSIS — I2782 Chronic pulmonary embolism: Secondary | ICD-10-CM | POA: Insufficient documentation

## 2020-08-27 DIAGNOSIS — I82409 Acute embolism and thrombosis of unspecified deep veins of unspecified lower extremity: Secondary | ICD-10-CM | POA: Diagnosis present

## 2020-08-27 DIAGNOSIS — Z7901 Long term (current) use of anticoagulants: Secondary | ICD-10-CM | POA: Diagnosis not present

## 2020-08-27 DIAGNOSIS — I871 Compression of vein: Secondary | ICD-10-CM | POA: Diagnosis not present

## 2020-08-27 DIAGNOSIS — Z91040 Latex allergy status: Secondary | ICD-10-CM | POA: Insufficient documentation

## 2020-08-27 DIAGNOSIS — Z79899 Other long term (current) drug therapy: Secondary | ICD-10-CM | POA: Diagnosis not present

## 2020-08-27 HISTORY — PX: PERIPHERAL VASCULAR THROMBECTOMY: CATH118306

## 2020-08-27 LAB — BUN: BUN: 14 mg/dL (ref 8–23)

## 2020-08-27 LAB — CREATININE, SERUM
Creatinine, Ser: 0.86 mg/dL (ref 0.44–1.00)
GFR calc Af Amer: >60 mL/min (ref >60)
GFR calc non Af Amer: >60 mL/min (ref >60)

## 2020-08-27 SURGERY — PERIPHERAL VASCULAR THROMBECTOMY
Anesthesia: Moderate Sedation | Laterality: Left

## 2020-08-27 MED ORDER — ONDANSETRON HCL 4 MG/2ML IJ SOLN: 4.0000 mg | Freq: Once | INTRAMUSCULAR | Status: AC

## 2020-08-27 MED ORDER — FENTANYL CITRATE (PF) 100 MCG/2ML IJ SOLN: 12.5000 ug | Freq: Once | INTRAMUSCULAR | Status: DC | PRN

## 2020-08-27 MED ORDER — METHYLPREDNISOLONE SODIUM SUCC 125 MG IJ SOLR: 125.0000 mg | Freq: Once | INTRAMUSCULAR | Status: DC | PRN

## 2020-08-27 MED ORDER — FAMOTIDINE 20 MG PO TABS: 40.0000 mg | ORAL_TABLET | Freq: Once | ORAL | Status: DC | PRN

## 2020-08-27 MED ORDER — MIDAZOLAM HCL 5 MG/5ML IJ SOLN
INTRAMUSCULAR | Status: AC
  Filled 2020-08-27: qty 5

## 2020-08-27 MED ORDER — FENTANYL CITRATE (PF) 100 MCG/2ML IJ SOLN
INTRAMUSCULAR | Status: AC
  Filled 2020-08-27: qty 2

## 2020-08-27 MED ORDER — CLINDAMYCIN PHOSPHATE 300 MG/50ML IV SOLN
INTRAVENOUS | Status: AC
  Administered 2020-08-27: 300 mg via INTRAVENOUS
  Filled 2020-08-27: qty 50

## 2020-08-27 MED ORDER — ALTEPLASE 2 MG IJ SOLR
INTRAMUSCULAR | Status: DC | PRN
  Administered 2020-08-27: 6 mg

## 2020-08-27 MED ORDER — ONDANSETRON HCL 4 MG/2ML IJ SOLN
INTRAMUSCULAR | Status: AC
  Administered 2020-08-27: 4 mg via INTRAVENOUS
  Filled 2020-08-27: qty 2

## 2020-08-27 MED ORDER — ONDANSETRON HCL 4 MG/2ML IJ SOLN: 4.0000 mg | Freq: Once | INTRAMUSCULAR | Status: DC

## 2020-08-27 MED ORDER — MIDAZOLAM HCL 2 MG/ML PO SYRP: 8.0000 mg | ORAL_SOLUTION | Freq: Once | ORAL | Status: DC | PRN

## 2020-08-27 MED ORDER — MIDAZOLAM HCL 2 MG/2ML IJ SOLN
INTRAMUSCULAR | Status: DC | PRN
  Administered 2020-08-27: 2 mg via INTRAVENOUS

## 2020-08-27 MED ORDER — FENTANYL CITRATE (PF) 100 MCG/2ML IJ SOLN
INTRAMUSCULAR | Status: DC | PRN
Start: 2020-08-27 — End: 2020-08-27
  Administered 2020-08-27: 50 ug via INTRAVENOUS

## 2020-08-27 MED ORDER — HEPARIN SODIUM (PORCINE) 1000 UNIT/ML IJ SOLN
INTRAMUSCULAR | Status: AC
  Filled 2020-08-27: qty 1

## 2020-08-27 MED ORDER — CLINDAMYCIN PHOSPHATE 300 MG/50ML IV SOLN: 300.0000 mg | Freq: Once | INTRAVENOUS | Status: AC

## 2020-08-27 MED ORDER — HEPARIN SODIUM (PORCINE) 1000 UNIT/ML IJ SOLN
INTRAMUSCULAR | Status: DC | PRN
  Administered 2020-08-27: 3000 [IU] via INTRAVENOUS

## 2020-08-27 MED ORDER — SODIUM CHLORIDE 0.9 % IV SOLN
INTRAVENOUS | Status: DC
  Administered 2020-08-27: 1000 mL via INTRAVENOUS

## 2020-08-27 MED ORDER — DIPHENHYDRAMINE HCL 50 MG/ML IJ SOLN: 50.0000 mg | Freq: Once | INTRAMUSCULAR | Status: DC | PRN

## 2020-08-27 MED ORDER — ONDANSETRON HCL 4 MG/2ML IJ SOLN: 4.0000 mg | Freq: Four times a day (QID) | INTRAMUSCULAR | Status: DC | PRN

## 2020-08-27 SURGICAL SUPPLY — 19 items
BALLN ATG 14X6X80 (BALLOONS) ×2
BALLN DORADO 10X80X80 (BALLOONS) ×2
BALLN DORADO 6X80X80 (BALLOONS) ×2
BALLN LUTONIX 6X220X130 (BALLOONS) ×2
BALLOON ATG 14X6X80 (BALLOONS) ×1 IMPLANT
BALLOON DORADO 10X80X80 (BALLOONS) ×1 IMPLANT
BALLOON DORADO 6X80X80 (BALLOONS) ×1 IMPLANT
BALLOON LUTONIX 6X220X130 (BALLOONS) ×1 IMPLANT
CANISTER PENUMBRA ENGINE (MISCELLANEOUS) ×2 IMPLANT
CANNULA 5F STIFF (CANNULA) ×2 IMPLANT
CATH BEACON 5 .035 65 KMP TIP (CATHETERS) ×2 IMPLANT
CATH INDIGO 12XTORQ 100 (CATHETERS) ×2 IMPLANT
DEVICE PRESTO INFLATION (MISCELLANEOUS) ×2 IMPLANT
GLIDEWIRE ADV .035X180CM (WIRE) ×2 IMPLANT
PACK ANGIOGRAPHY (CUSTOM PROCEDURE TRAY) ×2 IMPLANT
SHEATH PINNACLE 11FRX10 (SHEATH) ×2 IMPLANT
STENT LIFESTAR 14X60 (Permanent Stent) ×4 IMPLANT
WIRE J 3MM .035X145CM (WIRE) ×2 IMPLANT
WIRE MAGIC TORQUE 260C (WIRE) ×2 IMPLANT

## 2020-08-27 NOTE — Interval H&P Note (Signed)
History and Physical Interval Note:  08/27/2020 11:59 AM  Alice Donaldson  has presented today for surgery, with the diagnosis of LT leg venous thrombectomy   DVT    Covid  Sept 9.  The various methods of treatment have been discussed with the patient and family. After consideration of risks, benefits and other options for treatment, the patient has consented to  Procedure(s): PERIPHERAL VASCULAR THROMBECTOMY (Left) as a surgical intervention.  The patient's history has been reviewed, patient examined, no change in status, stable for surgery.  I have reviewed the patient's chart and labs.  Questions were answered to the patient's satisfaction.     Leotis Pain

## 2020-08-27 NOTE — Op Note (Signed)
[facility]   OPERATIVE NOTE   PRE-OPERATIVE DIAGNOSIS: extensive left lower extremity DVT  POST-OPERATIVE DIAGNOSIS: same with chronic left iliac vein stenosis  PROCEDURE: 1.   US guidance for vascular access to left popliteal vein 2.   Catheter placement into left external iliac vein from left popliteal approach 3.   IVC gram and left lower extremity venogram 4.   Catheter directed thrombolysis with 6 mg of TPA to the left external iliac, common femoral, and superficial femoral veins 5.   Mechanical thrombectomy to the left popliteal, superficial femoral, common femoral, and external iliac vein with the penumbra CAT 12 device 6.   PTA of superficial femoral and common femoral veins with 6 mm balloon as well as PTA of the left common femoral vein with 10 mm balloon 7.   PTA of the left external iliac vein with 10 mm balloon 8.   Life star stent placement x2 to the left external iliac vein with 14 mm diameter by 6 cm length stents postdilated with 14 mm balloons    SURGEON: Leotis Pain, MD  ASSISTANT(S): none  ANESTHESIA: local with moderate conscious sedation for those time and sedation51to minutes using 2 mg of Versed and 50 mcg of Fentanyl  ESTIMATED BLOOD LOSS: 250  FINDING(S): 1.   A lot of chronic thrombus as well as some more acute thrombus up in the common femoral and external iliac vein.  Chronic left external iliac vein high-grade stenosis in the 80% range  SPECIMEN(S):  none  INDICATIONS:    Patient is a 74 y.o. female who presents with extensive left lower extremity DVT which was recurrent.  Patient has marked leg swelling and pain.  Venous intervention is performed to reduce the symtpoms and avoid long term postphlebitic symptoms.    DESCRIPTION: After obtaining full informed written consent, the patient was brought back to the vascular suite and placed supine upon the table. Moderate conscious sedation was administered during a face to face  encounter with the patient throughout the procedure with my supervision of the RN administering medicines and monitoring the patient's vital signs, pulse oximetry, telemetry and mental status throughout from the start of the procedure until the patient was taken to the recovery room.  After obtaining adequate anesthesia, the patient was prepped and draped in the standard fashion.    The patient was then placed into the prone position.  The left popliteal vein was then accessed under direct ultrasound guidance without difficulty with a micropuncture needle and a permanent image was recorded.  I then upsized to an 11Fr sheath over a J wire.   3000 units of heparin were then given.  Imaging showed extensive DVT with minimal flow.  A Kumpe catheter and Magic tourque wire were then advanced into the CFV and images were performed.   I then got up into the iliac vein which showed stenosis and thrombus in the left external iliac vein but the common iliac vein and IVC were patent.  I then used the Kumpe catheter and instilled 6 mg of tpa throughout the left external iliac, common femoral, and superficial femoral veins.  After this dwelled, I used the Penumbra Cat 12 catheter and evacuated about 250 cc of effluent with mechanical thrombectomy throughout the iliac veins, CFV, SFV, and popliteal vein.  This had mild improvement but there was significant chronic changes particularly in the superficial femoral vein and the left external iliac vein.  I then treated the popliteal vein, SFV, and  CFV with a long 6 mm mm diameter angioplasty balloon to open a channel.  This resulted in some resolution of the thrombus and improved flow.  I then turned my attention to the iliac veins.  The stenosis/occlusion and thrombus was treated with a 6 cm long 10 mm diameter angioplasty balloon.  This was inflated 3 times in the left external iliac vein and then down to the common femoral vein.  The superficial femoral and common femoral veins  were improved with left external iliac vein remain markedly stenotic a greater than 70%.  I elected to stent this.  To 14 mm diameter by 6 cm stents were used to treat the left external iliac vein down to the top of the femoral head and then were postdilated with 14 mm balloons with excellent angiographic result and less than 20% residual stenosis.  There is only a small amount of residual thrombus present.  I then elected to terminate the procedure.  The sheath was removed and a dressing was placed.  She was taken to the recovery room in stable condition having tolerated the procedure well.    COMPLICATIONS: None  CONDITION: Stable  Leotis Pain 08/27/2020 2:39 PM

## 2020-08-27 NOTE — Discharge Instructions (Addendum)
Catheter-Directed Thrombolysis, Care After This sheet gives you information about how to care for yourself after your procedure. Your health care provider may also give you more specific instructions. If you have problems or questions, contact your health care provider. What can I expect after the procedure? After the procedure, it is common to have mild pain around your incision. Follow these instructions at home: Incision care   Follow instructions from your health care provider about how to take care of your incision. Make sure you: ? Wash your hands with soap and water before and after you change your bandage (dressing). If soap and water are not available, use hand sanitizer. ? Change your dressing as told by your health care provider.  Keep the incision area clean and dry.  Do not take baths, swim, or use a hot tub until your health care provider approves. Ask your health care provider if you may take showers. You may only be allowed to take sponge baths.  Check your incision area every day for signs of infection. Check for: ? Redness, swelling, or more pain. ? Fluid or blood. ? Warmth. ? Pus or a bad smell. Medicines  Take over-the-counter and prescription medicines only as told by your health care provider.  If you are taking blood thinners: ? Talk with your health care provider before you take any medicines that contain aspirin or NSAIDs, such as ibuprofen. These medicines increase your risk for dangerous bleeding. ? Take your medicine exactly as told, at the same time every day. ? Avoid activities that could cause injury or bruising, and follow instructions about how to prevent falls. ? Wear a medical alert bracelet or carry a card that lists what medicines you take. Activity   Do not drive until your health care provider approves.  Return to your normal activities as told by your health care provider. Ask your health care provider what activities are safe for  you. General instructions   Raise (elevate) your legs above the level of your heart when sitting or lying down. You can do this by putting pillows under your legs.  Wear compression stockings as told by your health care provider. These stockings help to prevent blood clots and reduce swelling in your legs.  Drink enough fluid to keep your urine pale yellow.  Do not use any products that contain nicotine or tobacco, such as cigarettes, e-cigarettes, and chewing tobacco. If you need help quitting, ask your health care provider.  Keep all follow-up visits as told by your health care provider. This is important. Contact a health care provider if:  You have redness, swelling, or more pain around your incision.  You have fluid or blood coming from your incision.  Your incision feels warm to the touch.  You have pus or a bad smell coming from your incision.  You have chills or a fever.  You bleed or bruise easily.  You have blood in your urine or stool. Get help right away if:  You have any symptoms of a stroke. "BE FAST" is an easy way to remember the main warning signs of a stroke: ? B - Balance. Signs are dizziness, sudden trouble walking, or loss of balance. ? E - Eyes. Signs are trouble seeing or a sudden change in vision. ? F - Face. Signs are sudden weakness or numbness of the face, or the face or eyelid drooping on one side. ? A - Arms. Signs are weakness or numbness in an arm. This happens  suddenly and usually on one side of the body. ? S - Speech. Signs are sudden trouble speaking, slurred speech, or trouble understanding what people say. ? T - Time. Time to call emergency services. Write down what time symptoms started.  You have other signs of a stroke, such as: ? A sudden, severe headache with no known cause. ? Nausea or vomiting. ? Seizure.  You have bleeding that does not stop after you apply pressure with your hands for several minutes.  You have chest  pain.  You have difficulty breathing.  You cough up blood.  You have redness, warmth, swelling, and pain in an arm or leg. These symptoms may represent a serious problem that is an emergency. Do not wait to see if the symptoms will go away. Get medical help right away. Call your local emergency services (911 in the [place] Do not drive yourself to the hospital. Summary  After the procedure, it is common to have mild pain around your incision.  Follow instructions from your health care provider about how to take care of your incision.  If you are taking blood thinners, talk with your health care provider before you take any medicines that contain aspirin or NSAIDs. Follow instructions about how to prevent falls.  Contact your health care provider if you have signs of an infected incision, such as redness, swelling, or more pain.  Get help right away if you have signs of stroke. BE FAST is an easy way to remember the warning signs of stroke. This information is not intended to replace advice given to you by your health care provider. Make sure you discuss any questions you have with your health care provider. Document Revised: 06/13/2019 Document Reviewed: 06/13/2019 Elsevier Patient Education  [place] may shower as of tomorrow. You may remove your compression wrap tomorrow.

## 2020-08-28 ENCOUNTER — Encounter: Payer: Self-pay | Admitting: Vascular Surgery

## 2020-08-29 ENCOUNTER — Other Ambulatory Visit: Payer: PPO

## 2020-08-29 ENCOUNTER — Encounter: Payer: Self-pay | Admitting: *Deleted

## 2020-08-31 ENCOUNTER — Telehealth (INDEPENDENT_AMBULATORY_CARE_PROVIDER_SITE_OTHER): Payer: Self-pay

## 2020-08-31 NOTE — Telephone Encounter (Signed)
Spoke with the patient and gave her the recommendations from Eulogio Ditch NP.

## 2020-08-31 NOTE — Telephone Encounter (Signed)
Patient had a left leg thrombectomy with Dr. Lucky Cowboy on 08/27/20 and called stating she has bluish knots on the inside of her left leg with redness. Patient stated she came in with those knots and thought they would go away after her procedure.

## 2020-08-31 NOTE — Telephone Encounter (Signed)
Those are not the blood clots that Dr. Lucky Cowboy removed.  Those blood clots are inside her leg and are not visible.  The bluish knots that she is describing are likely varicose veins and are different than what was treated.  That is something that can be discussed at her follow up visit.

## 2020-09-03 ENCOUNTER — Inpatient Hospital Stay: Payer: PPO

## 2020-09-03 ENCOUNTER — Other Ambulatory Visit: Payer: Self-pay

## 2020-09-03 DIAGNOSIS — D500 Iron deficiency anemia secondary to blood loss (chronic): Secondary | ICD-10-CM

## 2020-09-03 DIAGNOSIS — D509 Iron deficiency anemia, unspecified: Secondary | ICD-10-CM | POA: Diagnosis not present

## 2020-09-03 MED ORDER — CYANOCOBALAMIN 1000 MCG/ML IJ SOLN
1000.0000 ug | Freq: Once | INTRAMUSCULAR | Status: AC
  Administered 2020-09-03: 1000 ug via INTRAMUSCULAR
  Filled 2020-09-03: qty 1

## 2020-09-05 DIAGNOSIS — R29898 Other symptoms and signs involving the musculoskeletal system: Secondary | ICD-10-CM

## 2020-09-10 ENCOUNTER — Other Ambulatory Visit (INDEPENDENT_AMBULATORY_CARE_PROVIDER_SITE_OTHER): Payer: Self-pay | Admitting: Nurse Practitioner

## 2020-09-10 ENCOUNTER — Telehealth (INDEPENDENT_AMBULATORY_CARE_PROVIDER_SITE_OTHER): Payer: Self-pay

## 2020-09-10 MED ORDER — APIXABAN 5 MG PO TABS
ORAL_TABLET | ORAL | 0 refills | Status: DC
Start: 2020-09-10 — End: 2020-09-19

## 2020-09-10 NOTE — Telephone Encounter (Signed)
Done

## 2020-09-10 NOTE — Telephone Encounter (Signed)
The pt called  And left a VM on the nurse line and wanted to know if she could have a refill on elliquis 5 Mg up just up until October 5th she wants to change her Rx to plavix  She has an appointment on October 6 and would like to discuss the change in the medication then.

## 2020-09-11 NOTE — Telephone Encounter (Signed)
I called and left a VM for the pt making her aware that the Px was called in.

## 2020-09-12 ENCOUNTER — Ambulatory Visit (INDEPENDENT_AMBULATORY_CARE_PROVIDER_SITE_OTHER): Payer: PPO

## 2020-09-12 ENCOUNTER — Other Ambulatory Visit: Payer: Self-pay

## 2020-09-12 ENCOUNTER — Other Ambulatory Visit (INDEPENDENT_AMBULATORY_CARE_PROVIDER_SITE_OTHER): Payer: Self-pay | Admitting: Vascular Surgery

## 2020-09-12 DIAGNOSIS — Z23 Encounter for immunization: Secondary | ICD-10-CM | POA: Diagnosis not present

## 2020-09-12 DIAGNOSIS — I82402 Acute embolism and thrombosis of unspecified deep veins of left lower extremity: Secondary | ICD-10-CM

## 2020-09-17 ENCOUNTER — Ambulatory Visit: Payer: PPO | Admitting: Internal Medicine

## 2020-09-19 ENCOUNTER — Ambulatory Visit (INDEPENDENT_AMBULATORY_CARE_PROVIDER_SITE_OTHER): Payer: PPO

## 2020-09-19 ENCOUNTER — Other Ambulatory Visit (INDEPENDENT_AMBULATORY_CARE_PROVIDER_SITE_OTHER): Payer: Self-pay

## 2020-09-19 ENCOUNTER — Encounter (INDEPENDENT_AMBULATORY_CARE_PROVIDER_SITE_OTHER): Payer: Self-pay | Admitting: Nurse Practitioner

## 2020-09-19 ENCOUNTER — Other Ambulatory Visit: Payer: Self-pay

## 2020-09-19 ENCOUNTER — Ambulatory Visit (INDEPENDENT_AMBULATORY_CARE_PROVIDER_SITE_OTHER): Payer: PPO | Admitting: Nurse Practitioner

## 2020-09-19 VITALS — BP 167/84 | HR 82 | Resp 16 | Wt 238.8 lb

## 2020-09-19 DIAGNOSIS — I82412 Acute embolism and thrombosis of left femoral vein: Secondary | ICD-10-CM | POA: Diagnosis not present

## 2020-09-19 DIAGNOSIS — I82402 Acute embolism and thrombosis of unspecified deep veins of left lower extremity: Secondary | ICD-10-CM

## 2020-09-19 DIAGNOSIS — I872 Venous insufficiency (chronic) (peripheral): Secondary | ICD-10-CM

## 2020-09-19 DIAGNOSIS — E785 Hyperlipidemia, unspecified: Secondary | ICD-10-CM

## 2020-09-19 MED ORDER — APIXABAN 5 MG PO TABS: ORAL_TABLET | ORAL | 2 refills | Status: DC

## 2020-09-25 ENCOUNTER — Encounter (INDEPENDENT_AMBULATORY_CARE_PROVIDER_SITE_OTHER): Payer: Self-pay | Admitting: Nurse Practitioner

## 2020-09-25 NOTE — Progress Notes (Signed)
Subjective:    Patient ID: Alice Donaldson, female    DOB: Nov 25, 1946, 74 y.o.   MRN: [id number] Chief Complaint  Patient presents with  . Follow-up    [facility] 3wk ultrasound follow up    The patient returns today for follow-up after left lower extremity thrombectomy on 08/27/2020.  Previously the patient had a DVT in the left lower extremity however this was postsurgical.  Currently this DVT does not have any notable provoked causes.  Was also incidentally found was evidence of a pulmonary embolism.  However at the time of detection was chronic.  The patient notes that her lower extremity feels much better however she does continue to have swelling.  The patient also continues to have some tenderness along the area of the great saphenous vein where a superficial thrombophlebitis is apparent.  She has been elevating her lower extremities as much as possible.  She is also taking Eliquis as prescribed.  She denies any fever, chills, nausea, vomiting or diarrhea.  Today noninvasive studies show a chronic appearing thrombus seen throughout the common femoral vein, superficial femoral vein, popliteal and proximal calf veins.  The great saphenous vein is totally occluded with chronic thrombus.  One of the gastroc veins also appears occluded and one is widely patent.  The peroneal and posterior tibial veins are widely patent.   Review of Systems  Cardiovascular: Positive for leg swelling.  Hematological: Bruises/bleeds easily.  All other systems reviewed and are negative.      Objective:   Physical Exam Vitals reviewed.  HENT:     Head: Normocephalic.  Cardiovascular:     Rate and Rhythm: Normal rate.     Pulses: Normal pulses.  Pulmonary:     Effort: Pulmonary effort is normal.  Musculoskeletal:     Right lower leg: 1+ Edema present.     Left lower leg: 3+ Edema present.  Neurological:     Mental Status: She is alert and oriented to person, place, and time.  Psychiatric:        Mood and  Affect: Mood normal.        Behavior: Behavior normal.        Thought Content: Thought content normal.        Judgment: Judgment normal.     BP (!) 167/84 (BP Location: Right Arm)   Pulse 82   Resp 16   Wt 238 lb 12.8 oz (108.3 kg)   LMP  (LMP Unknown)   BMI 39.74 kg/m   Past Medical History:  Diagnosis Date  . Allergic rhinitis, cause unspecified   . Allergy   . Anemia   . Complication of anesthesia   . Diverticulosis   . DVT (deep venous thrombosis) ([place])   . Gastric ulcer    in past  . GERD (gastroesophageal reflux disease)   . Hemorrhoids, internal   . Hyperlipidemia   . Osteoarthrosis involving, or with mention of more than one site, but not specified as generalized, multiple sites   . PONV (postoperative nausea and vomiting)   . Ulcer (traumatic) of oral mucosa   . Unspecified venous (peripheral) insufficiency     Social History   Socioeconomic History  . Marital status: Married    Spouse name: Not on file  . Number of children: 1  . Years of education: Not on file  . Highest education level: Not on file  Occupational History  . Occupation: Copywriter, advertising    Comment: retired  Tobacco Use  .  Smoking status: Former Smoker    Types: Cigarettes  . Smokeless tobacco: Never Used  Vaping Use  . Vaping Use: Never used  Substance and Sexual Activity  . Alcohol use: No    Comment: rare glass of wine  . Drug use: No  . Sexual activity: Not on file  Other Topics Concern  . Not on file  Social History Narrative   No living will   Asks for husband to make decisions-- but not sure he can after stroke. Son Joneen Caraway should now do this   Would accept resuscitation--but no prolonged ventilation   Doesn't want tube feeds if cognitively unaware   Social Determinants of Health   Financial Resource Strain:   . Difficulty of Paying Living Expenses: Not on file  Food Insecurity:   . Worried About Charity fundraiser in the Last Year: Not on file  . Ran Out of Food in  the Last Year: Not on file  Transportation Needs:   . Lack of Transportation (Medical): Not on file  . Lack of Transportation (Non-Medical): Not on file  Physical Activity:   . Days of Exercise per Week: Not on file  . Minutes of Exercise per Session: Not on file  Stress:   . Feeling of Stress : Not on file  Social Connections:   . Frequency of Communication with Friends and Family: Not on file  . Frequency of Social Gatherings with Friends and Family: Not on file  . Attends Religious Services: Not on file  . Active Member of Clubs or Organizations: Not on file  . Attends Archivist Meetings: Not on file  . Marital Status: Not on file  Intimate Partner Violence:   . Fear of Current or Ex-Partner: Not on file  . Emotionally Abused: Not on file  . Physically Abused: Not on file  . Sexually Abused: Not on file    Past Surgical History:  Procedure Laterality Date  . ABDOMINAL HYSTERECTOMY  1996  . BREAST BIOPSY  2003  . CHOLECYSTECTOMY  2000  . COLONOSCOPY WITH PROPOFOL N/A 10/02/2015   Procedure: COLONOSCOPY WITH PROPOFOL;  Surgeon: Lollie Sails, MD;  Location: [facility] ENDOSCOPY;  Service: Endoscopy;  Laterality: N/A;  . COLONOSCOPY WITH PROPOFOL N/A 06/17/2018   Procedure: COLONOSCOPY WITH PROPOFOL;  Surgeon: Toledo, Benay Pike, MD;  Location: [facility] ENDOSCOPY;  Service: Gastroenterology;  Laterality: N/A;  . DEXA  5/14   Normal  . ESOPHAGOGASTRODUODENOSCOPY (EGD) WITH PROPOFOL N/A 06/17/2018   Procedure: ESOPHAGOGASTRODUODENOSCOPY (EGD) WITH PROPOFOL;  Surgeon: Toledo, Benay Pike, MD;  Location: [facility] ENDOSCOPY;  Service: Gastroenterology;  Laterality: N/A;  . KNEE ARTHROSCOPY     right in 2007, left in 2011  . PERIPHERAL VASCULAR THROMBECTOMY Left 08/27/2020   Procedure: PERIPHERAL VASCULAR THROMBECTOMY;  Surgeon: Algernon Huxley, MD;  Location: [facility];  Service: Cardiovascular;  Laterality: Left;  . TONSILLECTOMY AND ADENOIDECTOMY  childhood    Family History    Problem Relation Age of Onset  . Heart disease Mother   . Heart disease Father   . Diabetes Son 5       Type 1  . Hypertension Son   . Stroke Brother   . Diabetes Other 2       type 1  . Hyperlipidemia Neg Hx   . Depression Neg Hx     Allergies  Allergen Reactions  . Prevpac [Amoxicill-Clarithro-Lansopraz] Other (See Comments)    Throat closed up  . Latex Rash  . Duloxetine  Other (See Comments)    Nausea, diarrhea and jittery  . Tramadol     GI symptoms       Assessment & Plan:   1. Acute deep vein thrombosis (DVT) of femoral vein of left lower extremity (HCC) We will continue the patient on Eliquis for at least another 2 months which will allow for the patient to have at least 3 months of anticoagulation.  The patient DVT has progressed to a chronic state.  She is advised to utilize medical grade 1 compression stockings to help with postphlebitic pain and swelling.  In addition to elevation and exercise.  We will have the patient up to discuss anticoagulation changes in 2 months.  2. Chronic venous insufficiency The patient will continue with conservative therapy as outlined above.  3. Hyperlipidemia, unspecified hyperlipidemia type Continue statin as ordered and reviewed, no changes at this time    Current Outpatient Medications on File Prior to Visit  Medication Sig Dispense Refill  . acetaminophen (TYLENOL) 500 MG tablet Take 1,000 mg by mouth every 8 (eight) hours as needed.     [name] albuterol (PROAIR HFA) 108 (90 Base) MCG/ACT inhaler INHALE 2 PUFFS INTO THE LUNGS EVERY 6 (SIX) HOURS AS NEEDED FOR WHEEZING. (Patient taking differently: Inhale 2 puffs into the lungs every 6 (six) hours as needed for wheezing. ) 8.5 each 1  . cetirizine (ZYRTEC) 10 MG tablet Take 10 mg by mouth at bedtime.     . cholecalciferol (VITAMIN D) 1000 units tablet Take 1,000 Units by mouth daily.    [name] docusate sodium (STOOL SOFTENER) 100 MG capsule Take 100 mg by mouth daily as needed.     .  Glucos-Chondroit-Hyaluron-MSM (GLUCOSAMINE CHONDROITIN JOINT) TABS Take 2 tablets by mouth daily.    [name] LORazepam (ATIVAN) 0.5 MG tablet TAKE 1/2-1 TABLET BY MOUTH AT BEDTIME 30 tablet 0  . niacin 500 MG tablet Take 500 mg by mouth at bedtime.    . pantoprazole (PROTONIX) 40 MG tablet TAKE 1 TABLET (40 MG TOTAL) BY MOUTH 2 (TWO) TIMES DAILY. (Patient taking differently: 40 mg daily. ) 180 tablet 2  . polyethylene glycol (MIRALAX / GLYCOLAX) packet Take 17 g by mouth daily as needed for moderate constipation.     . tizanidine (ZANAFLEX) 2 MG capsule Take 1 capsule (2 mg total) by mouth 3 (three) times daily as needed for muscle spasms. 30 capsule 3  . TURMERIC PO Take by mouth. (Patient not taking: Reported on 08/27/2020)     No current facility-administered medications on file prior to visit.    There are no Patient Instructions on file for this visit. No follow-ups on file.   Kris Hartmann, NP

## 2020-09-25 NOTE — Telephone Encounter (Signed)
Mrs Deeley left v/m requesting cb about urgent message; I spoke with pt's wife and [facility] is backed up for several months for new pts referral and Mrs Pichon wants to know what Dr Silvio Pate would suggest about another PT to refer pt to; [facility] advised [facility] [phone] and [facility] [phone] and [facility] [phone]. Will Dr Silvio Pate go ahead and put in new referral order to a PT so pt can get started on PT. Mrs Schnapp request cb ASAP.

## 2020-09-26 NOTE — Telephone Encounter (Signed)
A telephone conversation had with this pt concerning her husband has been documented in her husband's chart.

## 2020-10-03 ENCOUNTER — Ambulatory Visit: Payer: PPO | Admitting: Internal Medicine

## 2020-10-17 DIAGNOSIS — H2513 Age-related nuclear cataract, bilateral: Secondary | ICD-10-CM | POA: Diagnosis not present

## 2020-10-18 NOTE — Progress Notes (Deleted)
Erroneous Encounter

## 2020-10-18 NOTE — Progress Notes (Deleted)
ERRONEOUS This encounter was created in error - please disregard.

## 2020-10-29 ENCOUNTER — Telehealth: Payer: Self-pay | Admitting: Internal Medicine

## 2020-10-29 NOTE — Progress Notes (Signed)
  Chronic Care Management   Note  10/29/2020 Name: Alice Donaldson MIN MRN: [id number] DOB: Apr 16, 1946  Alice Donaldson is a 74 y.o. year old female who is a primary care patient of [name], MD. I reached out to [name] by phone today in response to a referral sent by [name] PCP, [name], MD.   [name] was given information about Chronic Care Management services today including:  1. CCM service includes personalized support from designated clinical staff supervised by her physician, including individualized plan of care and coordination with other care providers 2. 24/7 contact phone numbers for assistance for urgent and routine care needs. 3. Service will only be billed when office clinical staff spend 20 minutes or more in a month to coordinate care. 4. Only one practitioner may furnish and bill the service in a calendar month. 5. The patient may stop CCM services at any time (effective at the end of the month) by phone call to the office staff.   Patient wishes to consider information provided and/or speak with a member of the care team before deciding about enrollment in care management services.   Follow up plan:   [name]

## 2020-11-01 ENCOUNTER — Telehealth: Payer: PPO

## 2020-11-05 ENCOUNTER — Encounter: Payer: Self-pay | Admitting: Oncology

## 2020-11-21 ENCOUNTER — Other Ambulatory Visit: Payer: Self-pay

## 2020-11-21 ENCOUNTER — Ambulatory Visit (INDEPENDENT_AMBULATORY_CARE_PROVIDER_SITE_OTHER): Payer: PPO | Admitting: Nurse Practitioner

## 2020-11-21 VITALS — BP 116/97 | HR 74 | Ht 65.0 in | Wt 223.0 lb

## 2020-11-21 DIAGNOSIS — E785 Hyperlipidemia, unspecified: Secondary | ICD-10-CM

## 2020-11-21 DIAGNOSIS — I82402 Acute embolism and thrombosis of unspecified deep veins of left lower extremity: Secondary | ICD-10-CM | POA: Diagnosis not present

## 2020-11-21 MED ORDER — APIXABAN 2.5 MG PO TABS: 2.5000 mg | ORAL_TABLET | Freq: Two times a day (BID) | ORAL | 3 refills | Status: DC

## 2020-11-24 NOTE — Progress Notes (Signed)
[place]  [phone] [phone]  ID: Gavriela C Dimino OB: [birth year]  MR#: [id number]  DDU#:[id number]  Patient Care Team: [name], MD as PCP - General (Pediatrics) Lloyd Huger, MD as Consulting Physician (Hematology and Oncology) [name], [facility] as Pharmacist (Pharmacist)  CHIEF COMPLAINT: Iron deficiency anemia.  INTERVAL HISTORY: Patient returns to clinic today for repeat laboratory she has noticed a mild increase of weakness and fatigue, but attributes this to being the primary caretaker of her husband who recently had a second CVA.  She otherwise feels well and is asymptomatic. She has no neurologic complaints.  She denies any recent fevers or illnesses. She has a good appetite and denies weight loss.  She denies any chest pain, shortness of breath, cough, or hemoptysis.  She denies any nausea, vomiting, constipation, or diarrhea. She denies any melena or hematochezia. She has no urinary complaints.  Patient offers no further specific complaints today.  REVIEW OF SYSTEMS:   Review of Systems  Constitutional: Positive for malaise/fatigue. Negative for fever and weight loss.  Respiratory: Negative.  Negative for cough, hemoptysis and shortness of breath.   Cardiovascular: Negative.  Negative for chest pain and leg swelling.  Gastrointestinal: Negative.  Negative for abdominal pain, blood in stool and melena.  Genitourinary: Negative.  Negative for hematuria.  Musculoskeletal: Negative.  Negative for back pain.  Skin: Negative.  Negative for rash.  Neurological: Positive for weakness. Negative for sensory change, focal weakness and headaches.  Psychiatric/Behavioral: Negative.  The patient is not nervous/anxious.     As per HPI. Otherwise, a complete review of systems is negative.  PAST MEDICAL HISTORY: Past Medical History:  Diagnosis Date  . Allergic rhinitis, cause unspecified   . Allergy   . Anemia   .  Complication of anesthesia   . Diverticulosis   . DVT (deep venous thrombosis) ([place])   . Gastric ulcer    in past  . GERD (gastroesophageal reflux disease)   . Hemorrhoids, internal   . Hyperlipidemia   . Osteoarthrosis involving, or with mention of more than one site, but not specified as generalized, multiple sites   . PONV (postoperative nausea and vomiting)   . Ulcer (traumatic) of oral mucosa   . Unspecified venous (peripheral) insufficiency     PAST SURGICAL HISTORY: Past Surgical History:  Procedure Laterality Date  . ABDOMINAL HYSTERECTOMY  1996  . BREAST BIOPSY  2003  . CHOLECYSTECTOMY  2000  . COLONOSCOPY WITH PROPOFOL N/A 10/02/2015   Procedure: COLONOSCOPY WITH PROPOFOL;  Surgeon: Lollie Sails, MD;  Location: [facility] ENDOSCOPY;  Service: Endoscopy;  Laterality: N/A;  . COLONOSCOPY WITH PROPOFOL N/A 06/17/2018   Procedure: COLONOSCOPY WITH PROPOFOL;  Surgeon: Toledo, Benay Pike, MD;  Location: [facility] ENDOSCOPY;  Service: Gastroenterology;  Laterality: N/A;  . DEXA  5/14   Normal  . ESOPHAGOGASTRODUODENOSCOPY (EGD) WITH PROPOFOL N/A 06/17/2018   Procedure: ESOPHAGOGASTRODUODENOSCOPY (EGD) WITH PROPOFOL;  Surgeon: Toledo, Benay Pike, MD;  Location: [facility] ENDOSCOPY;  Service: Gastroenterology;  Laterality: N/A;  . KNEE ARTHROSCOPY     right in 2007, left in 2011  . PERIPHERAL VASCULAR THROMBECTOMY Left 08/27/2020   Procedure: PERIPHERAL VASCULAR THROMBECTOMY;  Surgeon: Algernon Huxley, MD;  Location: [place] [facility];  Service: Cardiovascular;  Laterality: Left;  . TONSILLECTOMY AND ADENOIDECTOMY  childhood    FAMILY HISTORY: Family History  Problem Relation Age of Onset  . Heart disease Mother   . Heart disease Father   . Diabetes Son  5       Type 1  . Hypertension Son   . Stroke Brother   . Diabetes Other 2       type 1  . Hyperlipidemia Neg Hx   . Depression Neg Hx     ADVANCED DIRECTIVES (Y/N):  N  HEALTH MAINTENANCE: Social History   Tobacco Use  .  Smoking status: Former Smoker    Types: Cigarettes  . Smokeless tobacco: Never Used  Vaping Use  . Vaping Use: Never used  Substance Use Topics  . Alcohol use: No    Comment: rare glass of wine  . Drug use: No     Colonoscopy:  PAP:  Bone density:  Lipid panel:  Allergies  Allergen Reactions  . Prevpac [Amoxicill-Clarithro-Lansopraz] Other (See Comments)    Throat closed up  . Latex Rash  . Duloxetine Other (See Comments)    Nausea, diarrhea and jittery  . Tramadol     GI symptoms    Current Outpatient Medications  Medication Sig Dispense Refill  . acetaminophen (TYLENOL) 500 MG tablet Take 1,000 mg by mouth every 8 (eight) hours as needed.     [name] albuterol (PROAIR HFA) 108 (90 Base) MCG/ACT inhaler INHALE 2 PUFFS INTO THE LUNGS EVERY 6 (SIX) HOURS AS NEEDED FOR WHEEZING. (Patient taking differently: Inhale 2 puffs into the lungs every 6 (six) hours as needed for wheezing.) 8.5 each 1  . apixaban (ELIQUIS) 2.5 MG TABS tablet Take 1 tablet (2.5 mg total) by mouth 2 (two) times daily. 60 tablet 3  . cetirizine (ZYRTEC) 10 MG tablet Take 10 mg by mouth at bedtime.     . cholecalciferol (VITAMIN D) 1000 units tablet Take 1,000 Units by mouth daily.    [name] docusate sodium (COLACE) 100 MG capsule Take 100 mg by mouth daily as needed.     . etodolac (LODINE) 500 MG tablet Take 500 mg by mouth 2 (two) times daily.    . Glucos-Chondroit-Hyaluron-MSM (GLUCOSAMINE CHONDROITIN JOINT) TABS Take 2 tablets by mouth daily.    [name] LORazepam (ATIVAN) 0.5 MG tablet TAKE 1/2-1 TABLET BY MOUTH AT BEDTIME 30 tablet 0  . niacin 500 MG tablet Take 500 mg by mouth at bedtime.    . pantoprazole (PROTONIX) 40 MG tablet TAKE 1 TABLET (40 MG TOTAL) BY MOUTH 2 (TWO) TIMES DAILY. (Patient taking differently: 40 mg daily.) 180 tablet 2  . polyethylene glycol (MIRALAX / GLYCOLAX) packet Take 17 g by mouth daily as needed for moderate constipation.     . tizanidine (ZANAFLEX) 2 MG capsule Take 1 capsule (2 mg  total) by mouth 3 (three) times daily as needed for muscle spasms. 30 capsule 3  . TURMERIC PO Take by mouth. (Patient not taking: Reported on 08/27/2020)     No current facility-administered medications for this visit.    OBJECTIVE: Vitals:   11/29/20 1355  BP: (!) 160/95  Pulse: 69  Resp: 20  Temp: 97.8 F (36.6 C)  SpO2: 98%     Body mass index is 39.11 kg/m.    ECOG FS:0 - Asymptomatic  General: Well-developed, well-nourished, no acute distress. Eyes: Pink conjunctiva, anicteric sclera. HEENT: Normocephalic, moist mucous membranes. Lungs: No audible wheezing or coughing. Heart: Regular rate and rhythm. Abdomen: Soft, nontender, no obvious distention. Musculoskeletal: No edema, cyanosis, or clubbing. Neuro: Alert, answering all questions appropriately. Cranial nerves grossly intact. Skin: No rashes or petechiae noted. Psych: Normal affect.   LAB RESULTS:  Lab Results  Component Value Date  NA 140 08/18/2020   K 4.3 08/18/2020   CL 107 08/18/2020   CO2 25 08/18/2020   GLUCOSE 96 08/18/2020   BUN 14 08/27/2020   CREATININE 0.86 08/27/2020   CALCIUM 9.4 08/18/2020   PROT 7.0 08/02/2020   ALBUMIN 4.0 08/02/2020   AST 15 08/02/2020   ALT 13 08/02/2020   ALKPHOS 131 (H) 08/02/2020   BILITOT 0.8 08/02/2020   GFRNONAA >60 08/27/2020   GFRAA >60 08/27/2020    Lab Results  Component Value Date   WBC 5.6 11/29/2020   NEUTROABS 3.1 11/29/2020   HGB 11.3 (L) 11/29/2020   HCT 36.4 11/29/2020   MCV 82.0 11/29/2020   PLT 274 11/29/2020   Lab Results  Component Value Date   IRON 30 11/29/2020   TIBC 435 11/29/2020   IRONPCTSAT 7 (L) 11/29/2020   Lab Results  Component Value Date   FERRITIN 8 (L) 11/29/2020     STUDIES: No results found.  ASSESSMENT: Iron deficiency anemia.  PLAN:    1.  Iron deficiency anemia: Patient's hemoglobin and iron stores have trended down and she is mildly symptomatic. Her most recent EGD and colonoscopy on June 17, 2018 did  not reveal specific source of GI bleed, but did note chronic gastritis.  Proceed with 510 mg IV Feraheme today.  Return to clinic in 1 to 2 weeks for a second infusion.  Patient will then return to clinic in 6 months with repeat laboratory work, further evaluation, and continuation of treatment if needed.   2.  B12 deficiency: Patient's B12 level is reduced, therefore will add B12 injection with her next Feraheme infusion. 3.  Thrombocytopenia: Resolved.   4.  Family history of multiple myeloma: Brother has multiple myeloma.  Currently, there is nothing in her laboratory work that suggests she has myeloma.  Can consider SPEP in the future, but this is not necessary at this point.    I spent a total of 30 minutes reviewing chart data, face-to-face evaluation with the patient, counseling and coordination of care as detailed above.   Patient expressed understanding and was in agreement with this plan. She also understands that She can call clinic at any time with any questions, concerns, or complaints.    Lloyd Huger, MD   11/30/2020 3:55 PM

## 2020-11-26 ENCOUNTER — Encounter (INDEPENDENT_AMBULATORY_CARE_PROVIDER_SITE_OTHER): Payer: Self-pay | Admitting: Nurse Practitioner

## 2020-11-26 NOTE — Progress Notes (Signed)
Subjective:    Patient ID: Alice Donaldson, female    DOB: 07-17-1946, 74 y.o.   MRN: [id number] Chief Complaint  Patient presents with  . Follow-up    74mo no studies     Patient returns today after 3 months of anticoagulation following a left lower extremity thrombectomy.  This is the patient's second DVT.  This DVT was felt to be unprovoked whereas her previous DVT was related to surgical causes.  Was also detected that the patient had a chronic pulmonary embolism.  Patient does continue to have some swelling but it is improved.  The tenderness has decreased that she had previously along the area of the great saphenous vein.  The patient has also been elevating her lower extremities as possible.  She has also been taking Eliquis as prescribed.  She denies any fever, chills, nausea, vomiting or diarrhea.     Review of Systems  Cardiovascular: Positive for leg swelling.  All other systems reviewed and are negative.      Objective:   Physical Exam Vitals reviewed.  HENT:     Head: Normocephalic.  Cardiovascular:     Rate and Rhythm: Normal rate.  Pulmonary:     Effort: Pulmonary effort is normal.  Musculoskeletal:     Right lower leg: Edema present.     Left lower leg: Edema present.  Neurological:     Mental Status: She is alert and oriented to person, place, and time.  Psychiatric:        Mood and Affect: Mood normal.        Behavior: Behavior normal.        Thought Content: Thought content normal.        Judgment: Judgment normal.     BP (!) 116/97   Pulse 74   Ht 5\' 5"  (1.651 m)   Wt 223 lb (101.2 kg)   LMP  (LMP Unknown)   BMI 37.11 kg/m   Past Medical History:  Diagnosis Date  . Allergic rhinitis, cause unspecified   . Allergy   . Anemia   . Complication of anesthesia   . Diverticulosis   . DVT (deep venous thrombosis) ([place])   . Gastric ulcer    in past  . GERD (gastroesophageal reflux disease)   . Hemorrhoids, internal   . Hyperlipidemia   .  Osteoarthrosis involving, or with mention of more than one site, but not specified as generalized, multiple sites   . PONV (postoperative nausea and vomiting)   . Ulcer (traumatic) of oral mucosa   . Unspecified venous (peripheral) insufficiency     Social History   Socioeconomic History  . Marital status: Married    Spouse name: Not on file  . Number of children: 1  . Years of education: Not on file  . Highest education level: Not on file  Occupational History  . Occupation: Copywriter, advertising    Comment: retired  Tobacco Use  . Smoking status: Former Smoker    Types: Cigarettes  . Smokeless tobacco: Never Used  Vaping Use  . Vaping Use: Never used  Substance and Sexual Activity  . Alcohol use: No    Comment: rare glass of wine  . Drug use: No  . Sexual activity: Not on file  Other Topics Concern  . Not on file  Social History Narrative   No living will   Asks for husband to make decisions-- but not sure he can after stroke. Son Joneen Caraway should now do this  Would accept resuscitation--but no prolonged ventilation   Doesn't want tube feeds if cognitively unaware   Social Determinants of Health   Financial Resource Strain: Not on file  Food Insecurity: Not on file  Transportation Needs: Not on file  Physical Activity: Not on file  Stress: Not on file  Social Connections: Not on file  Intimate Partner Violence: Not on file    Past Surgical History:  Procedure Laterality Date  . ABDOMINAL HYSTERECTOMY  1996  . BREAST BIOPSY  2003  . CHOLECYSTECTOMY  2000  . COLONOSCOPY WITH PROPOFOL N/A 10/02/2015   Procedure: COLONOSCOPY WITH PROPOFOL;  Surgeon: Lollie Sails, MD;  Location: [facility] ENDOSCOPY;  Service: Endoscopy;  Laterality: N/A;  . COLONOSCOPY WITH PROPOFOL N/A 06/17/2018   Procedure: COLONOSCOPY WITH PROPOFOL;  Surgeon: Toledo, Benay Pike, MD;  Location: [facility] ENDOSCOPY;  Service: Gastroenterology;  Laterality: N/A;  . DEXA  5/14   Normal  .  ESOPHAGOGASTRODUODENOSCOPY (EGD) WITH PROPOFOL N/A 06/17/2018   Procedure: ESOPHAGOGASTRODUODENOSCOPY (EGD) WITH PROPOFOL;  Surgeon: Toledo, Benay Pike, MD;  Location: [facility] ENDOSCOPY;  Service: Gastroenterology;  Laterality: N/A;  . KNEE ARTHROSCOPY     right in 2007, left in 2011  . PERIPHERAL VASCULAR THROMBECTOMY Left 08/27/2020   Procedure: PERIPHERAL VASCULAR THROMBECTOMY;  Surgeon: Algernon Huxley, MD;  Location: [place] [facility];  Service: Cardiovascular;  Laterality: Left;  . TONSILLECTOMY AND ADENOIDECTOMY  childhood    Family History  Problem Relation Age of Onset  . Heart disease Mother   . Heart disease Father   . Diabetes Son 5       Type 1  . Hypertension Son   . Stroke Brother   . Diabetes Other 2       type 1  . Hyperlipidemia Neg Hx   . Depression Neg Hx     Allergies  Allergen Reactions  . Prevpac [Amoxicill-Clarithro-Lansopraz] Other (See Comments)    Throat closed up  . Latex Rash  . Duloxetine Other (See Comments)    Nausea, diarrhea and jittery  . Tramadol     GI symptoms    CBC Latest Ref Rng & Units 08/24/2020 08/18/2020 05/29/2020  WBC 4.0 - 10.5 K/uL 5.8 6.5 6.2  Hemoglobin 12.0 - 15.0 g/dL 12.3 12.7 14.2  Hematocrit 36.0 - 46.0 % 37.3 38.9 43.1  Platelets 150 - 400 K/uL 285 215 265      CMP     Component Value Date/Time   NA 140 08/18/2020 1145   NA 140 05/12/2014 0936   K 4.3 08/18/2020 1145   K 3.9 05/12/2014 0936   CL 107 08/18/2020 1145   CL 111 (H) 05/12/2014 0936   CO2 25 08/18/2020 1145   CO2 26 05/12/2014 0936   GLUCOSE 96 08/18/2020 1145   GLUCOSE 123 (H) 05/12/2014 0936   BUN 14 08/27/2020 1305   BUN 15 05/12/2014 0936   CREATININE 0.86 08/27/2020 1305   CREATININE 1.03 05/12/2014 0936   CALCIUM 9.4 08/18/2020 1145   CALCIUM 9.1 05/12/2014 0936   PROT 7.0 08/02/2020 1519   PROT 7.2 05/12/2014 0936   ALBUMIN 4.0 08/02/2020 1519   ALBUMIN 3.4 05/12/2014 0936   AST 15 08/02/2020 1519   AST 30 05/12/2014 0936   ALT 13  08/02/2020 1519   ALT 26 05/12/2014 0936   ALKPHOS 131 (H) 08/02/2020 1519   ALKPHOS 144 (H) 05/12/2014 0936   BILITOT 0.8 08/02/2020 1519   BILITOT 0.5 05/12/2014 0936   GFRNONAA >60 08/27/2020 1305  GFRNONAA 55 (L) 05/12/2014 0936   GFRAA >60 08/27/2020 1305   GFRAA >60 05/12/2014 0936     No results found.     Assessment & Plan:   1. Deep vein thrombosis (DVT) of left lower extremity, unspecified chronicity, unspecified vein (HCC) Because this is the patient's second DVT which is felt to be unprovoked, we will transition the patient from a full dose of Eliquis to 2.5 mg twice a day.  The patient also has a history of anemia and will be following soon with her hematologist.  I have suggested hypercoagulable studies to determine if the patient does have any possible hypercoagulable disorders.  If not it may be safe to stop Eliquis in the future however if she does have hypercoagulable disorder continue anticoagulation indefinitely may be an option.  The patient will follow up with [place] on an as-needed basis.  2. Hyperlipidemia, unspecified hyperlipidemia type Continue statin as ordered and reviewed, no changes at this time    Current Outpatient Medications on File Prior to Visit  Medication Sig Dispense Refill  . acetaminophen (TYLENOL) 500 MG tablet Take 1,000 mg by mouth every 8 (eight) hours as needed.     [name] albuterol (PROAIR HFA) 108 (90 Base) MCG/ACT inhaler INHALE 2 PUFFS INTO THE LUNGS EVERY 6 (SIX) HOURS AS NEEDED FOR WHEEZING. (Patient taking differently: Inhale 2 puffs into the lungs every 6 (six) hours as needed for wheezing. ) 8.5 each 1  . cetirizine (ZYRTEC) 10 MG tablet Take 10 mg by mouth at bedtime.     . cholecalciferol (VITAMIN D) 1000 units tablet Take 1,000 Units by mouth daily.    [name] docusate sodium (STOOL SOFTENER) 100 MG capsule Take 100 mg by mouth daily as needed.     . etodolac (LODINE) 500 MG tablet Take 500 mg by mouth 2 (two) times daily.    .  Glucos-Chondroit-Hyaluron-MSM (GLUCOSAMINE CHONDROITIN JOINT) TABS Take 2 tablets by mouth daily.    [name] LORazepam (ATIVAN) 0.5 MG tablet TAKE 1/2-1 TABLET BY MOUTH AT BEDTIME 30 tablet 0  . niacin 500 MG tablet Take 500 mg by mouth at bedtime.    . pantoprazole (PROTONIX) 40 MG tablet TAKE 1 TABLET (40 MG TOTAL) BY MOUTH 2 (TWO) TIMES DAILY. (Patient taking differently: 40 mg daily. ) 180 tablet 2  . polyethylene glycol (MIRALAX / GLYCOLAX) packet Take 17 g by mouth daily as needed for moderate constipation.     . tizanidine (ZANAFLEX) 2 MG capsule Take 1 capsule (2 mg total) by mouth 3 (three) times daily as needed for muscle spasms. 30 capsule 3  . TURMERIC PO Take by mouth. (Patient not taking: Reported on 08/27/2020)     No current facility-administered medications on file prior to visit.    There are no Patient Instructions on file for this visit. No follow-ups on file.   Kris Hartmann, NP

## 2020-11-29 ENCOUNTER — Inpatient Hospital Stay: Payer: PPO | Attending: Oncology

## 2020-11-29 ENCOUNTER — Encounter: Payer: Self-pay | Admitting: Oncology

## 2020-11-29 ENCOUNTER — Inpatient Hospital Stay (HOSPITAL_BASED_OUTPATIENT_CLINIC_OR_DEPARTMENT_OTHER): Payer: PPO | Admitting: Oncology

## 2020-11-29 ENCOUNTER — Inpatient Hospital Stay: Payer: PPO

## 2020-11-29 VITALS — BP 128/79 | HR 65

## 2020-11-29 VITALS — BP 160/95 | HR 69 | Temp 97.8°F | Resp 20 | Wt 235.0 lb

## 2020-11-29 DIAGNOSIS — E538 Deficiency of other specified B group vitamins: Secondary | ICD-10-CM | POA: Diagnosis not present

## 2020-11-29 DIAGNOSIS — D500 Iron deficiency anemia secondary to blood loss (chronic): Secondary | ICD-10-CM

## 2020-11-29 DIAGNOSIS — D509 Iron deficiency anemia, unspecified: Secondary | ICD-10-CM | POA: Insufficient documentation

## 2020-11-29 LAB — FERRITIN: Ferritin: 8 ng/mL — ABNORMAL LOW (ref 11–307)

## 2020-11-29 LAB — CBC WITH DIFFERENTIAL/PLATELET
Abs Immature Granulocytes: 0.02 10*3/uL (ref 0.00–0.07)
Basophils Absolute: 0.1 10*3/uL (ref 0.0–0.1)
Basophils Relative: 1 %
Eosinophils Absolute: 0.2 10*3/uL (ref 0.0–0.5)
Eosinophils Relative: 3 %
HCT: 36.4 % (ref 36.0–46.0)
Hemoglobin: 11.3 g/dL — ABNORMAL LOW (ref 12.0–15.0)
Immature Granulocytes: 0 %
Lymphocytes Relative: 29 %
Lymphs Abs: 1.6 10*3/uL (ref 0.7–4.0)
MCH: 25.5 pg — ABNORMAL LOW (ref 26.0–34.0)
MCHC: 31 g/dL (ref 30.0–36.0)
MCV: 82 fL (ref 80.0–100.0)
Monocytes Absolute: 0.7 10*3/uL (ref 0.1–1.0)
Monocytes Relative: 12 %
Neutro Abs: 3.1 10*3/uL (ref 1.7–7.7)
Neutrophils Relative %: 55 %
Platelets: 274 10*3/uL (ref 150–400)
RBC: 4.44 MIL/uL (ref 3.87–5.11)
RDW: 13.9 % (ref 11.5–15.5)
WBC: 5.6 10*3/uL (ref 4.0–10.5)
nRBC: 0 % (ref 0.0–0.2)

## 2020-11-29 LAB — IRON AND TIBC
Iron: 30 ug/dL (ref 28–170)
Saturation Ratios: 7 % — ABNORMAL LOW (ref 10.4–31.8)
TIBC: 435 ug/dL (ref 250–450)
UIBC: 405 ug/dL

## 2020-11-29 LAB — VITAMIN B12: Vitamin B-12: 167 pg/mL — ABNORMAL LOW (ref 180–914)

## 2020-11-29 MED ORDER — SODIUM CHLORIDE 0.9 % IV SOLN
510.0000 mg | Freq: Once | INTRAVENOUS | Status: AC
  Administered 2020-11-29: 510 mg via INTRAVENOUS
  Filled 2020-11-29: qty 510

## 2020-11-29 MED ORDER — SODIUM CHLORIDE 0.9 % IV SOLN
INTRAVENOUS | Status: DC
  Filled 2020-11-29: qty 250

## 2020-11-29 NOTE — Progress Notes (Signed)
Stable at discharge 

## 2020-12-12 ENCOUNTER — Other Ambulatory Visit: Payer: Self-pay | Admitting: Internal Medicine

## 2020-12-12 NOTE — Telephone Encounter (Signed)
Last filled 08-02-20 #30 Last OV 08-02-20 Next OV 02-25-21 [facility]

## 2020-12-13 ENCOUNTER — Other Ambulatory Visit (INDEPENDENT_AMBULATORY_CARE_PROVIDER_SITE_OTHER): Payer: Self-pay | Admitting: Nurse Practitioner

## 2020-12-13 ENCOUNTER — Telehealth (INDEPENDENT_AMBULATORY_CARE_PROVIDER_SITE_OTHER): Payer: Self-pay

## 2020-12-13 MED ORDER — APIXABAN 5 MG PO TABS: 5.0000 mg | ORAL_TABLET | Freq: Two times a day (BID) | ORAL | 2 refills | Status: DC

## 2020-12-13 NOTE — Telephone Encounter (Signed)
Pt called and wanted to know if she could go back to her original dosage of plavix  Due to the cost with insurance. I made the NP aware and she sent in the new Rx and I called and made the pt aware.

## 2020-12-19 ENCOUNTER — Encounter: Payer: Self-pay | Admitting: Oncology

## 2020-12-24 ENCOUNTER — Inpatient Hospital Stay: Payer: PPO | Attending: Oncology

## 2020-12-24 VITALS — BP 152/83 | HR 60 | Temp 97.8°F | Resp 18

## 2020-12-24 DIAGNOSIS — D509 Iron deficiency anemia, unspecified: Secondary | ICD-10-CM | POA: Insufficient documentation

## 2020-12-24 DIAGNOSIS — D500 Iron deficiency anemia secondary to blood loss (chronic): Secondary | ICD-10-CM

## 2020-12-24 DIAGNOSIS — E538 Deficiency of other specified B group vitamins: Secondary | ICD-10-CM | POA: Insufficient documentation

## 2020-12-24 MED ORDER — SODIUM CHLORIDE 0.9 % IV SOLN
510.0000 mg | Freq: Once | INTRAVENOUS | Status: AC
  Administered 2020-12-24: 510 mg via INTRAVENOUS
  Filled 2020-12-24: qty 510

## 2020-12-24 MED ORDER — SODIUM CHLORIDE 0.9 % IV SOLN
Freq: Once | INTRAVENOUS | Status: AC
  Filled 2020-12-24: qty 250

## 2020-12-24 MED ORDER — CYANOCOBALAMIN 1000 MCG/ML IJ SOLN
1000.0000 ug | Freq: Once | INTRAMUSCULAR | Status: AC
  Administered 2020-12-24: 1000 ug via INTRAMUSCULAR
  Filled 2020-12-24: qty 1

## 2020-12-24 NOTE — Progress Notes (Signed)
1456- Patient tolerated Feraheme infusion and Vitamin B-12 injection well. Patient and vital signs stable. Patient discharged to home at this time.

## 2020-12-31 ENCOUNTER — Telehealth: Payer: Self-pay | Admitting: Internal Medicine

## 2020-12-31 NOTE — Progress Notes (Signed)
°  Chronic Care Management   Note  12/31/2020 Name: [name] MRN: [id number] DOB: Oct 03, 1946  [name] is a 75 y.o. year old female who is a primary care patient of [name], MD. I reached out to Zea Chiquita Loth by phone today in response to a referral sent by Ms. Raaga C Galik's PCP, [name], MD.   Ms. Killman was given information about Chronic Care Management services today including:  1. CCM service includes personalized support from designated clinical staff supervised by her physician, including individualized plan of care and coordination with other care providers 2. 24/7 contact phone numbers for assistance for urgent and routine care needs. 3. Service will only be billed when office clinical staff spend 20 minutes or more in a month to coordinate care. 4. Only one practitioner may furnish and bill the service in a calendar month. 5. The patient may stop CCM services at any time (effective at the end of the month) by phone call to the office staff.   Patient did not agree to enrollment in care management services and does not wish to consider at this time.  Follow up plan:   Ama

## 2021-02-19 NOTE — Telephone Encounter (Signed)
Okay to write note now-----needs permanent deferment from jury duty due to severe disability from a stroke

## 2021-02-25 ENCOUNTER — Encounter: Payer: PPO | Admitting: Internal Medicine

## 2021-02-28 ENCOUNTER — Inpatient Hospital Stay: Payer: PPO | Attending: Oncology

## 2021-02-28 DIAGNOSIS — E538 Deficiency of other specified B group vitamins: Secondary | ICD-10-CM | POA: Insufficient documentation

## 2021-02-28 DIAGNOSIS — D500 Iron deficiency anemia secondary to blood loss (chronic): Secondary | ICD-10-CM

## 2021-02-28 MED ORDER — CYANOCOBALAMIN 1000 MCG/ML IJ SOLN
1000.0000 ug | Freq: Once | INTRAMUSCULAR | Status: AC
  Administered 2021-02-28: 1000 ug via INTRAMUSCULAR
  Filled 2021-02-28: qty 1

## 2021-03-01 NOTE — Progress Notes (Signed)
Visit cancelled.

## 2021-03-04 ENCOUNTER — Ambulatory Visit: Payer: PPO

## 2021-03-04 MED ORDER — PREDNISONE 20 MG PO TABS: 40.0000 mg | ORAL_TABLET | Freq: Every day | ORAL | 0 refills | Status: DC

## 2021-03-04 NOTE — Addendum Note (Signed)
Addended by: Viviana Simpler I on: 03/04/2021 01:02 PM   Modules accepted: Orders

## 2021-03-11 ENCOUNTER — Telehealth: Payer: Self-pay

## 2021-03-11 NOTE — Chronic Care Management (AMB) (Addendum)
    Chronic Care Management Pharmacy Assistant   Name: Alice Donaldson  MRN: [id number] DOB: [birth year]  Reason for Encounter: [facility] Medication Coordination  Medications: Outpatient Encounter Medications as of 03/11/2021  Medication Sig   acetaminophen (TYLENOL) 500 MG tablet Take 1,000 mg by mouth every 8 (eight) hours as needed.    albuterol (PROAIR HFA) 108 (90 Base) MCG/ACT inhaler INHALE 2 PUFFS INTO THE LUNGS EVERY 6 (SIX) HOURS AS NEEDED FOR WHEEZING. (Patient taking differently: Inhale 2 puffs into the lungs every 6 (six) hours as needed for wheezing.)   apixaban (ELIQUIS) 5 MG TABS tablet Take 1 tablet (5 mg total) by mouth 2 (two) times daily.   cetirizine (ZYRTEC) 10 MG tablet Take 10 mg by mouth at bedtime.    cholecalciferol (VITAMIN D) 1000 units tablet Take 1,000 Units by mouth daily.   docusate sodium (COLACE) 100 MG capsule Take 100 mg by mouth daily as needed.    etodolac (LODINE) 500 MG tablet Take 500 mg by mouth 2 (two) times daily.   Glucos-Chondroit-Hyaluron-MSM (GLUCOSAMINE CHONDROITIN JOINT) TABS Take 2 tablets by mouth daily.   LORazepam (ATIVAN) 0.5 MG tablet TAKE 1/2 TO 1 TABLET BY MOUTH AT BEDTIME   niacin 500 MG tablet Take 500 mg by mouth at bedtime.   pantoprazole (PROTONIX) 40 MG tablet TAKE 1 TABLET (40 MG TOTAL) BY MOUTH 2 (TWO) TIMES DAILY. (Patient taking differently: 40 mg daily.)   polyethylene glycol (MIRALAX / GLYCOLAX) packet Take 17 g by mouth daily as needed for moderate constipation.    predniSONE (DELTASONE) 20 MG tablet Take 2 tablets (40 mg total) by mouth daily. Reported on 03/03/2016   tizanidine (ZANAFLEX) 2 MG capsule Take 1 capsule (2 mg total) by mouth 3 (three) times daily as needed for muscle spasms.   TURMERIC PO Take by mouth. (Patient not taking: Reported on 08/27/2020)   No facility-administered encounter medications on file as of 03/11/2021.   Patient called and expressed interest in using [facility] coordination/pharmacy for her  medications. Patient requested a call in 2-3 weeks to review medication count and create sync plan. Filled out form today for Sharyn Lull to complete. Once appointment with Sharyn Lull is completed, [facility]'s [facility] of medications for delivery, med sync, and adherence packaging from [facility].  Follow-Up:  Coordination of Enhanced Pharmacy Services and Pharmacist Review  [name], CPP notified  Margaretmary Dys, [place] Pharmacy Assistant [phone]  I have reviewed the care management and care coordination activities outlined in this encounter and I am certifying that I agree with the content of this note. No further action required.  [name], PharmD Clinical Pharmacist [facility] [phone]

## 2021-03-29 ENCOUNTER — Telehealth: Payer: Self-pay

## 2021-03-29 NOTE — Chronic Care Management (AMB) (Addendum)
    Chronic Care Management Pharmacy Assistant   Name: Alice Donaldson  MRN: [id number] DOB: Oct 19, 1946  Reason for Encounter: CCM Follow Up Reminder for 04/02/21   Medications: Outpatient Encounter Medications as of 03/29/2021  Medication Sig   acetaminophen (TYLENOL) 500 MG tablet Take 1,000 mg by mouth every 8 (eight) hours as needed.    albuterol (PROAIR HFA) 108 (90 Base) MCG/ACT inhaler INHALE 2 PUFFS INTO THE LUNGS EVERY 6 (SIX) HOURS AS NEEDED FOR WHEEZING. (Patient taking differently: Inhale 2 puffs into the lungs every 6 (six) hours as needed for wheezing.)   apixaban (ELIQUIS) 5 MG TABS tablet Take 1 tablet (5 mg total) by mouth 2 (two) times daily.   cetirizine (ZYRTEC) 10 MG tablet Take 10 mg by mouth at bedtime.    cholecalciferol (VITAMIN D) 1000 units tablet Take 1,000 Units by mouth daily.   docusate sodium (COLACE) 100 MG capsule Take 100 mg by mouth daily as needed.    etodolac (LODINE) 500 MG tablet Take 500 mg by mouth 2 (two) times daily.   Glucos-Chondroit-Hyaluron-MSM (GLUCOSAMINE CHONDROITIN JOINT) TABS Take 2 tablets by mouth daily.   LORazepam (ATIVAN) 0.5 MG tablet TAKE 1/2 TO 1 TABLET BY MOUTH AT BEDTIME   niacin 500 MG tablet Take 500 mg by mouth at bedtime.   pantoprazole (PROTONIX) 40 MG tablet TAKE 1 TABLET (40 MG TOTAL) BY MOUTH 2 (TWO) TIMES DAILY. (Patient taking differently: 40 mg daily.)   polyethylene glycol (MIRALAX / GLYCOLAX) packet Take 17 g by mouth daily as needed for moderate constipation.    predniSONE (DELTASONE) 20 MG tablet Take 2 tablets (40 mg total) by mouth daily. Reported on 03/03/2016   tizanidine (ZANAFLEX) 2 MG capsule Take 1 capsule (2 mg total) by mouth 3 (three) times daily as needed for muscle spasms.   TURMERIC PO Take by mouth. (Patient not taking: Reported on 08/27/2020)   No facility-administered encounter medications on file as of 03/29/2021.   Bob C Corbridge was contacted to remind her of her upcoming telephone visit with  [name] on 04/02/21 at 1:00 PM. She was reminded to have all medications, supplements and any blood glucose and blood pressure readings available for review at appointment.   Are you having any problems with your medications?  No problems with medications.   What concerns would you like to discuss with the pharmacist? Would like to discuss [facility] onboard.    Star Rating Drugs: Medication:  Last Fill: Day Supply No Star rating drugs identified.  [name], CPP notified  Margaretmary Dys, [place] Pharmacy Assistant [phone]  I have reviewed the care management and care coordination activities outlined in this encounter and I am certifying that I agree with the content of this note. No further action required.  [name], PharmD Clinical Pharmacist [facility] at [facility] [phone]

## 2021-04-02 ENCOUNTER — Other Ambulatory Visit: Payer: Self-pay

## 2021-04-02 ENCOUNTER — Ambulatory Visit (INDEPENDENT_AMBULATORY_CARE_PROVIDER_SITE_OTHER): Payer: PPO

## 2021-04-02 ENCOUNTER — Telehealth: Payer: Self-pay

## 2021-04-02 DIAGNOSIS — F419 Anxiety disorder, unspecified: Secondary | ICD-10-CM

## 2021-04-02 DIAGNOSIS — E785 Hyperlipidemia, unspecified: Secondary | ICD-10-CM

## 2021-04-02 MED ORDER — PANTOPRAZOLE SODIUM 40 MG PO TBEC: 1.0000 | DELAYED_RELEASE_TABLET | Freq: Two times a day (BID) | ORAL | 0 refills | Status: DC

## 2021-04-02 NOTE — Telephone Encounter (Signed)
Rx sent electronically.  

## 2021-04-02 NOTE — Telephone Encounter (Signed)
-----   Message from [name], [facility] sent at 04/02/2021  1:29 PM EDT ----- Regarding: Refill Refill request for pantoprazole to [facility].  Thank you!  [name], PharmD Clinical Pharmacist [facility] at [facility] [phone]

## 2021-04-02 NOTE — Telephone Encounter (Signed)
   Chronic Care Management Pharmacy Note  04/02/2021 Name:  Alice Donaldson MRN:  [id number] DOB:  05/22/1946  Subjective: Alice Donaldson is an 75 y.o. year old female who is a primary patient of [name], MD.  The CCM team was consulted for assistance with disease management and care coordination needs.    Attempted to reach patient by telephone for follow up visit - patient wanted to discuss [facility] coordination and pharmacy services.  Consent to Services:  The patient was given information about Chronic Care Management services, agreed to services, and gave verbal consent prior to initiation of services.  Please see initial visit note for detailed documentation.   Patient Care Team: [name], MD as PCP - General (Pediatrics) Lloyd Huger, MD as Consulting Physician (Hematology and Oncology) [name], [facility] as Pharmacist (Pharmacist)  Left voicemail with contact information on home phone.   [name], PharmD Clinical Pharmacist [facility] at [facility] [phone]

## 2021-04-02 NOTE — Progress Notes (Addendum)
Chronic Care Management Pharmacy Note  04/02/21 Name:  Alice Donaldson MRN:  [id number] DOB:  Oct 16, 1946  Subjective: Alice Donaldson is an 75 y.o. year old female who is a primary patient of [name], MD.  The CCM team was consulted for assistance with disease management and care coordination needs.    Engaged with patient by telephone for follow up visit in response to provider referral for pharmacy case management and/or care coordination services.   Consent to Services:  The patient was given information about Chronic Care Management services, agreed to services, and gave verbal consent prior to initiation of services.  Please see initial visit note for detailed documentation.   Patient Care Team: [name], MD as PCP - General (Pediatrics) Lloyd Huger, MD as Consulting Physician (Hematology and Oncology) [name], [facility] as Pharmacist (Pharmacist)  Recent office visits: 08/02/20 - Ongoing back pain, etodolac BID, wants to try a different muscle relaxer. Mild abdominal symptoms, on PPI, continue due to NSAID use daily. Anxiety, episodic mood disorder. Uses lorazepam most nights.  Recent consult visits: 02/29/20 - Infusion iron deficiency  12/24/20 - Infusion iron deficiency  11/29/20 - Office visit -  iron deficiency anemia, chronic gastritis on EGD June 17, 2018. Proceed with IV iron today. B12 injection.   Hospital visits: None in previous 6 months  Objective:  Lab Results  Component Value Date   CREATININE 0.86 08/27/2020   BUN 14 08/27/2020   GFR 53.93 (L) 08/02/2020   GFRNONAA >60 08/27/2020   GFRAA >60 08/27/2020   NA 140 08/18/2020   K 4.3 08/18/2020   CALCIUM 9.4 08/18/2020   CO2 25 08/18/2020   GLUCOSE 96 08/18/2020    Lab Results  Component Value Date/Time   HGBA1C 6.1 04/18/2015 11:50 AM   GFR 53.93 (L) 08/02/2020 03:19 PM   GFR 70.71 06/03/2017 11:35 AM    Lab Results  Component Value Date   CHOL 196 05/06/2016   HDL 46.20  05/06/2016   LDLCALC 132 (H) 05/06/2016   LDLDIRECT 179.3 04/12/2013   TRIG 87.0 05/06/2016   CHOLHDL 4 05/06/2016    Hepatic Function Latest Ref Rng & Units 08/02/2020 06/15/2018 06/03/2017  Total Protein 6.0 - 8.3 g/dL 7.0 7.1 7.1  Albumin 3.5 - 5.2 g/dL 4.0 3.6 4.2  AST 0 - 37 U/L '15 18 19  ' ALT 0 - 35 U/L '13 15 18  ' Alk Phosphatase 39 - 117 U/L 131(H) 108 138(H)  Total Bilirubin 0.2 - 1.2 mg/dL 0.8 0.5 0.7  Bilirubin, Direct 0.0 - 0.3 mg/dL - - -    Lab Results  Component Value Date/Time   TSH 0.85 04/14/2014 11:46 AM   TSH 1.20 04/12/2013 12:41 PM   FREET4 0.84 08/02/2020 03:19 PM   FREET4 1.05 05/06/2016 11:31 AM    CBC Latest Ref Rng & Units 11/29/2020 08/24/2020 08/18/2020  WBC 4.0 - 10.5 K/uL 5.6 5.8 6.5  Hemoglobin 12.0 - 15.0 g/dL 11.3(L) 12.3 12.7  Hematocrit 36.0 - 46.0 % 36.4 37.3 38.9  Platelets 150 - 400 K/uL 274 285 215    No results found for: VD25OH  Clinical ASCVD: No  The ASCVD Risk score Mikey Bussing DC Jr., et al., 2013) failed to calculate for the following reasons:   Cannot find a previous HDL lab   Cannot find a previous total cholesterol lab    Depression screen [facility] 2/9 08/02/2020 06/03/2017 07/03/2016  Decreased Interest 0 0 0  Down, Depressed, Hopeless 1 1 0  PHQ - 2 Score 1 1 0  Altered sleeping - - -  Tired, decreased energy - - -  Change in appetite - - -  Feeling bad or failure about yourself  - - -  Trouble concentrating - - -  Moving slowly or fidgety/restless - - -  Suicidal thoughts - - -  PHQ-9 Score - - -  Difficult doing work/chores - - -    Social History   Tobacco Use  Smoking Status Former Smoker  . Types: Cigarettes  Smokeless Tobacco Never Used   BP Readings from Last 3 Encounters:  12/24/20 (!) 152/83  11/29/20 128/79  11/29/20 (!) 160/95   Pulse Readings from Last 3 Encounters:  12/24/20 60  11/29/20 65  11/29/20 69   Wt Readings from Last 3 Encounters:  11/29/20 235 lb (106.6 kg)  11/21/20 223 lb (101.2 kg)   09/19/20 238 lb 12.8 oz (108.3 kg)   BMI Readings from Last 3 Encounters:  11/29/20 39.11 kg/m  11/21/20 37.11 kg/m  09/19/20 39.74 kg/m    Assessment/Interventions: Review of patient past medical history, allergies, medications, health status, including review of consultants reports, laboratory and other test data, was performed as part of comprehensive evaluation and provision of chronic care management services.   SDOH:  (Social Determinants of Health) assessments and interventions performed: Yes SDOH Interventions   Flowsheet Row Most Recent Value  SDOH Interventions   Financial Strain Interventions Intervention Not Indicated     SDOH Screenings   Alcohol Screen: Not on file  Depression (PHQ2-9): Low Risk   . PHQ-2 Score: 1  Financial Resource Strain: Low Risk   . Difficulty of Paying Living Expenses: Not very hard  Food Insecurity: Not on file  Housing: Not on file  Physical Activity: Not on file  Social Connections: Not on file  Stress: Not on file  Tobacco Use: Medium Risk  . Smoking Tobacco Use: Former Smoker  . Smokeless Tobacco Use: Never Used  Transportation Needs: Not on file    CCM Care Plan  Allergies  Allergen Reactions  . Prevpac [Amoxicill-Clarithro-Lansopraz] Other (See Comments)    Throat closed up  . Latex Rash  . Duloxetine Other (See Comments)    Nausea, diarrhea and jittery  . Tramadol     GI symptoms    Medications Reviewed Today    Reviewed by [name], [facility] (Pharmacist) on 04/08/21 at 1520  Med List Status: <None>  Medication Order Taking? Sig Documenting Provider Last Dose Status Informant  acetaminophen (TYLENOL) 500 MG tablet [id number]  Take 1,000 mg by mouth every 8 (eight) hours as needed.  [provider]  Active   albuterol (PROAIR HFA) 108 (90 Base) MCG/ACT inhaler [id number]  INHALE 2 PUFFS INTO THE LUNGS EVERY 6 (SIX) HOURS AS NEEDED FOR WHEEZING.  Patient taking differently: Inhale 2 puffs into the lungs  every 6 (six) hours as needed for wheezing.   [name], MD  Active Self  apixaban (ELIQUIS) 5 MG TABS tablet [id number] Yes Take 1 tablet (5 mg total) by mouth 2 (two) times daily. Kris Hartmann, NP Taking Active   cetirizine (ZYRTEC) 10 MG tablet [id number]  Take 10 mg by mouth at bedtime.  [provider]  Active Self  cholecalciferol (VITAMIN D) 1000 units tablet [id number]  Take 1,000 Units by mouth daily. [provider]  Active Self  docusate sodium (COLACE) 100 MG capsule [id number]  Take 100 mg by mouth daily as needed.  [provider]  Active Self           Med Note [facility], ASHLEY M   Tue Jun 15, 2018  7:52 PM)    etodolac (LODINE) 500 MG tablet [id number] Yes Take 500 mg by mouth 2 (two) times daily. [provider] Taking Active   Glucos-Chondroit-Hyaluron-MSM (GLUCOSAMINE CHONDROITIN JOINT) TABS [id number]  Take 2 tablets by mouth daily. [provider]  Active Self  LORazepam (ATIVAN) 0.5 MG tablet [id number] Yes TAKE 1/2 TO 1 TABLET BY MOUTH AT BEDTIME [name], MD Taking Active   niacin 500 MG tablet [id number]  Take 500 mg by mouth at bedtime. [provider]  Active   pantoprazole (PROTONIX) 40 MG tablet [id number] Yes Take 1 tablet (40 mg total) by mouth 2 (two) times daily. [name], MD Taking Active   polyethylene glycol [facility] / GLYCOLAX) packet [id number]  Take 17 g by mouth daily as needed for moderate constipation.  [provider]  Active Self  tizanidine (ZANAFLEX) 2 MG capsule [id number] Yes Take 1 capsule (2 mg total) by mouth 3 (three) times daily as needed for muscle spasms. [name], MD Taking Active   Med List Note Lona Millard, RN 03/03/16 1454): Uds 03/04/2015          Patient Active Problem List   Diagnosis Date Noted  . Chronic pulmonary embolism ([place]) 08/21/2020  . Gastritis 06/21/2018  . Anxiety 06/15/2018  . Symptomatic anemia 06/15/2018  . Morbid obesity  ([place]) 06/03/2017  . Iron deficiency anemia 08/14/2016  . Advance directive discussed with patient 05/06/2016  . Routine general medical examination at a health care facility 04/12/2013  . GERD (gastroesophageal reflux disease)   . Allergic rhinitis   . Spinal stenosis, lumbar region, with neurogenic claudication   . Hyperlipidemia   . DVT (deep venous thrombosis) ([place])   . Chronic venous insufficiency     Immunization History  Administered Date(s) Administered  . Fluad Quad(high Dose 65+) 09/12/2020  . Influenza Whole 08/26/2011  . Influenza, High Dose Seasonal PF 09/04/2015, 09/01/2016  . Influenza,inj,Quad PF,6+ Mos 11/01/2014, 09/03/2017, 09/16/2018, 09/09/2019  . Influenza-Unspecified 11/01/2014, 09/03/2017  . Moderna Sars-Covid-2 Vaccination 12/28/2019, 01/25/2020, 10/19/2020  . Pneumococcal Conjugate-13 04/14/2014  . Pneumococcal Polysaccharide-23 08/15/2010, 06/03/2017  . Tdap 08/26/2011  . Zoster 12/15/2008    Conditions to be addressed/monitored:  Hyperlipidemia, Anxiety and Chronic back pain, History of PE  Care Plan : [place]  Updates made by [name], [facility] since 04/08/2021 12:00 AM    Problem: CHL AMB "PATIENT-SPECIFIC PROBLEM"     Long-Range Goal: Patient Stated   Start Date: 04/02/2021  Priority: High  Note:     Current Barriers:  . None identified  Pharmacist Clinical Goal(s):  [name] Patient will contact provider office for questions/concerns as evidenced notation of same in electronic health record through collaboration with PharmD and provider.   Interventions: . 1:1 collaboration with [name], MD regarding development and update of comprehensive plan of care as evidenced by provider attestation and co-signature . Inter-disciplinary care team collaboration (see longitudinal plan of care) . Comprehensive medication review performed; medication list updated in electronic medical record  Hyperlipidemia: (LDL goal <  100) -Uncontrolled - due for repeat lipid panel, last LDL 132, 2017 -Current treatment: . None -Medications previously tried: none -Current dietary patterns: did not discuss today in light of recent passing of her husband -Current exercise habits: minimal -Recommend updating lipid panel at follow up  Pre-diabetes: (A1c goal <  6.4%) -Due for repeat A1c, last A1c 6.1% 2016  -Current treatment: . None -Medications previously tried: none -Current dietary patterns: did not discuss today in light of recent passing of her husband -Current exercise habits: minimal -Recommend updating A1c at follow up  Anxiety (Goal: Control symptoms) -Controlled - per patient report -Current treatment: . Lorazepam 0.5 mg - 1/2 to 1 tablet daily at bedtime -Medications previously tried/failed: none  -Takes most nights, helps with sleep/anxiety. Recent loss of spouse, doing okay. -Recommended to continue current medication  Chronic PE/DVT (Goal: Prevent clots) -Current treatment  . Eliquis 5 mg - 1 twice daily -Medications previously tried: none -Recommended to continue current medication  Chronic Back Pain (Goal: Control symptoms) -Controlled - per patient report -Current treatment  . Etodolac 500 mg - 1 tablet BID . Tylenol 500 mg - 2 tabs q8h PRN . Tizanidine 2 mg - 1 capsule TID PRN . Glucosamine Chondroitin -Medications previously tried: none reported -GFR declined some in August, continue to monitor/reassess NSAID safety -Watch for bleeding signs/symptoms with Eliquis/Etodolac, continue PPI BID -Continue current medications   Patient Goals/Self-Care Activities . Patient will:  - take medications as prescribed  Follow Up Plan: Telephone follow up appointment with care management team member scheduled for: 4 months     Medication Assistance: None required.  Patient affirms current coverage meets needs.  Patient's preferred pharmacy is:  [facility] - [place], [place] - [address] [place] [address] Phone: [phone] Fax: [phone]  We discussed: Benefits of medication synchronization, packaging and delivery as well as enhanced pharmacist oversight with [facility]. Patient decided to: Utilize [facility] pharmacy for medication synchronization, packaging and delivery   Verbal consent obtained for [facility] enhanced pharmacy services (medication synchronization, adherence packaging, delivery coordination). A medication sync plan was created to allow patient to get all medications delivered once every 30 to 90 days per patient preference. Patient understands they have freedom to choose pharmacy and clinical pharmacist will coordinate care between all prescribers and [facility].  Care Plan and Follow Up Patient Decision:  Patient agrees to Care Plan and Follow-up.  [name], PharmD Clinical Pharmacist [place] [facility] at [facility] [phone]   Encounter details: CCM Time Spent      Value Time User   Time spent with patient (minutes)  50 04/08/2021  3:36 PM [name], [facility]   Time spent performing Chart review  30 04/08/2021  3:36 PM [name], [facility]   Total time (minutes)  80 04/08/2021  3:36 PM [name], RPH     Moderate to High Complex Decision Making      Value Time User   Moderate to High complex decision making  Yes 04/08/2021  3:30 PM [name], [facility]     CCM Services: Complex  Prior to outreach and patient consent for Chronic Care Management, I referred this patient for services after reviewing the nominated patient list or from a personal encounter with the patient.  I have personally reviewed this encounter including the documentation in this note and have collaborated with the care management provider regarding care management and care coordination activities to include development and update of the comprehensive care plan. I am certifying that  I agree with the content of this note and encounter as supervising physician.

## 2021-04-03 ENCOUNTER — Telehealth: Payer: Self-pay

## 2021-04-03 NOTE — Chronic Care Management (AMB) (Signed)
    Chronic Care Management Pharmacy Assistant   Name: Alice Donaldson  MRN: [id number] DOB: July 13, 1946   Reason for Encounter: [facility] Onboard Process    Medications: Outpatient Encounter Medications as of 04/03/2021  Medication Sig  . acetaminophen (TYLENOL) 500 MG tablet Take 1,000 mg by mouth every 8 (eight) hours as needed.   [name] albuterol (PROAIR HFA) 108 (90 Base) MCG/ACT inhaler INHALE 2 PUFFS INTO THE LUNGS EVERY 6 (SIX) HOURS AS NEEDED FOR WHEEZING. (Patient taking differently: Inhale 2 puffs into the lungs every 6 (six) hours as needed for wheezing.)  . apixaban (ELIQUIS) 5 MG TABS tablet Take 1 tablet (5 mg total) by mouth 2 (two) times daily.  . cetirizine (ZYRTEC) 10 MG tablet Take 10 mg by mouth at bedtime.   . cholecalciferol (VITAMIN D) 1000 units tablet Take 1,000 Units by mouth daily.  [name] docusate sodium (COLACE) 100 MG capsule Take 100 mg by mouth daily as needed.   . etodolac (LODINE) 500 MG tablet Take 500 mg by mouth 2 (two) times daily.  . Glucos-Chondroit-Hyaluron-MSM (GLUCOSAMINE CHONDROITIN JOINT) TABS Take 2 tablets by mouth daily.  [name] LORazepam (ATIVAN) 0.5 MG tablet TAKE 1/2 TO 1 TABLET BY MOUTH AT BEDTIME  . niacin 500 MG tablet Take 500 mg by mouth at bedtime.  . pantoprazole (PROTONIX) 40 MG tablet Take 1 tablet (40 mg total) by mouth 2 (two) times daily.  . polyethylene glycol (MIRALAX / GLYCOLAX) packet Take 17 g by mouth daily as needed for moderate constipation.   . predniSONE (DELTASONE) 20 MG tablet Take 2 tablets (40 mg total) by mouth daily. Reported on 03/03/2016  . tizanidine (ZANAFLEX) 2 MG capsule Take 1 capsule (2 mg total) by mouth 3 (three) times daily as needed for muscle spasms.  . TURMERIC PO Take by mouth. (Patient not taking: Reported on 08/27/2020)   No facility-administered encounter medications on file as of 04/03/2021.   Following appointment with [name] on 04/02/21 patient requested to onboard with [facility]. Medication  count was completed at visit and onboarding form was completed. Contacted [facility] to coordinate patient's medications for delivery, med sync, and adherence packaging from [facility].  Follow-Up:  Comptroller and Pharmacist Review  [name], CPP notified  Margaretmary Dys, [place] Pharmacy Assistant [phone]

## 2021-04-05 ENCOUNTER — Other Ambulatory Visit: Payer: Self-pay

## 2021-04-05 NOTE — Telephone Encounter (Signed)
Patient called to request refill on lorazepam to [facility].  [name], PharmD Clinical Pharmacist [place] [facility] at [facility] [phone]

## 2021-04-08 MED ORDER — LORAZEPAM 0.5 MG PO TABS: ORAL_TABLET | ORAL | 0 refills | Status: DC

## 2021-04-08 NOTE — Telephone Encounter (Signed)
Last filled 12-12-20 #30 Last OV/MCW 08-02-20 No Future OV [facility]

## 2021-04-08 NOTE — Patient Instructions (Addendum)
Dear Alice Donaldson,  Below is a summary of the goals we discussed during our follow up appointment on April 02, 2021.Alice Donaldson Please contact me anytime with questions or concerns.   Visit Information  Patient Care Plan: CCM Pharmacy Care Plan    Problem Identified: [facility] AMB "PATIENT-SPECIFIC PROBLEM"     Long-Range Goal: Patient Stated   Start Date: 04/02/2021  Priority: High  Note:     Current Barriers:  . None identified  Pharmacist Clinical Goal(s):  Alice Donaldson Patient will contact provider office for questions/concerns as evidenced notation of same in electronic health record through collaboration with PharmD and provider.   Interventions: . 1:1 collaboration with [name], MD regarding development and update of comprehensive plan of care as evidenced by provider attestation and co-signature . Inter-disciplinary care team collaboration (see longitudinal plan of care) . Comprehensive medication review performed; medication list updated in electronic medical record  Hyperlipidemia: (LDL goal < 100) -Uncontrolled - due for repeat lipid panel, last LDL 132, 2017 -Current treatment: . None -Medications previously tried: none -Current dietary patterns: did not discuss today in light of recent passing of her husband -Current exercise habits: minimal -Recommend updating lipid panel at follow up  Pre-diabetes: (A1c goal < 6.4%) -Due for repeat A1c, last A1c 6.1% 2016  -Current treatment: . None -Medications previously tried: none -Current dietary patterns: did not discuss today in light of recent passing of her husband -Current exercise habits: minimal -Recommend updating A1c at follow up  Anxiety (Goal: Control symptoms) -Controlled - per patient report -Current treatment: . Lorazepam 0.5 mg - 1/2 to 1 tablet daily at bedtime -Medications previously tried/failed: none  -Takes most nights, helps with sleep/anxiety. Recent loss of spouse, doing okay. -Recommended to continue  current medication  Chronic PE/DVT (Goal: Prevent clots) -Current treatment  . Eliquis 5 mg - 1 twice daily -Medications previously tried: none -Recommended to continue current medication  Chronic Back Pain (Goal: Control symptoms) -Controlled - per patient report -Current treatment  . Etodolac 500 mg - 1 tablet BID . Tylenol 500 mg - 2 tabs q8h PRN . Tizanidine 2 mg - 1 capsule TID PRN . Glucosamine Chondroitin -Medications previously tried: none reported -GFR declined some in August, continue to monitor/reassess NSAID safety -Watch for bleeding signs/symptoms with Eliquis/Etodolac, continue PPI BID -Continue current medications   Patient Goals/Self-Care Activities . Patient will:  - take medications as prescribed  Follow Up Plan: Telephone follow up appointment with care management team member scheduled for: 4 months      The patient verbalized understanding of instructions, educational materials, and care plan provided today and agreed to receive a mailed copy of patient instructions, educational materials, and care plan.   Alice Donaldson, PharmD Clinical Pharmacist [facility] [phone]   Basics of Medicine Management Taking your medicines correctly is an important part of managing or preventing medical problems. Make sure you know what disease or condition your medicine is treating, and how and when to take it. If you do not take your medicine correctly, it may not work well and may cause unpleasant side effects, including serious health problems. What should I do when I am taking medicines?  Read all the labels and inserts that come with your medicines. Review the information often.  Talk with your pharmacist if you get a refill and notice a change in the size, color, or shape of your medicines.  Know the potential side effects for each medicine that you take.  Try to get all your medicines from the same pharmacy. The pharmacist will  have all your information and will understand how your medicines will affect each other (interact).  Tell your health care provider about all your medicines, including over-the-counter medicines, vitamins, and herbal or dietary supplements. He or she will make sure that nothing will interact with any of your prescribed medicines.   How can I take my medicines safely?  Take medicines only as told by your health care provider. ? Do not take more of your medicine than instructed. ? Do not take anyone else's medicines. ? Do not share your medicines with others. ? Do not stop taking your medicines unless your health care provider tells you to do so. ? You may need to avoid alcohol or certain foods or liquids when taking certain medicines. Follow your health care provider's instructions.  Do not split, mash, or chew your medicines unless your health care provider tells you to do so. Tell your health care provider if you have trouble swallowing your medicines.  For liquid medicine, use the dosing container that was provided. How should I organize my medicines? Know your medicines  Know what each of your medicines looks like. This includes size, color, and shape. Tell your health care provider if you are having trouble recognizing all the medicines that you are taking.  If you cannot tell your medicines apart because they look similar, keep them in original bottles.  If you cannot read the labels on the bottles, tell your pharmacist to put your medicines in containers with large print.  Review your medicines and your schedule with family members, a friend, or a caregiver. Use a pill organizer  Use a tool to organize your medicine schedule. Tools include a weekly pillbox, a written chart, a notebook, or a calendar.  Your tool should help you remember the following things about each medicine: ? The name of the medicine. ? The amount (dose) to take. ? The schedule. This is the day and time the  medicine should be taken. ? The appearance. This includes color, shape, size, and stamp. ? How to take your medicines. This includes instructions to take them with food, without food, with fluids, or with other medicines.  Create reminders for taking your medicines. Use sticky notes, or alarms on your watch, mobile device, or phone calendar.  You may choose to use a more advanced management system. These systems have storage, alarms, and visual and audio prompts.  Some medicines can be taken on an "as-needed" basis. These include medicines for nausea or pain. If you take an as-needed medicine, write down the name and dose, as well as the date and time that you took it.   How should I plan for travel?  Take your pillbox, medicines, and organization system with you when traveling.  Have your medicines refilled before you travel. This will ensure that you do not run out of your medicines while you are away from home.  Always carry an updated list of your medicines with you. If there is an emergency, a first responder can quickly see what medicines you are taking.  Do not pack your medicines in checked luggage in case your luggage is lost or delayed.  If any of your medicines is considered a controlled substance, make sure you bring a letter from your health care provider with you. How should I store and discard my medicines? For safe storage:  Store medicines in a cool, dry area away from  light, or as directed by your health care provider. Do not store medicines in the bathroom. Heat and humidity will affect them.  Do not store your medicines with other chemicals, or with medicines for pets or other household members.  Keep medicines away from children and pets. Do not leave them on counters or bedside tables. Store them in high cabinets or on high shelves. For safe disposal:  Check expiration dates regularly. Do not take expired medicines. Discard medicines that are older than the  expiration date.  Learn a safe way to dispose of your medicines. You may: ? Use a local government, hospital, or pharmacy medicine-take-back program. ? Mix the medicines with inedible substances, put them in a sealed bag or empty container, and throw them in the trash. What should I remember?  Tell your health care provider if you: ? Experience side effects. ? Have new symptoms. ? Have other concerns about taking your medicines.  Review your medicines regularly with your health care provider. Other medicines, diet, medical conditions, weight changes, and daily habits can all affect how medicines work. Ask if you need to continue taking each medicine, and discuss how well each one is working.  Refill your medicines early to avoid running out of them.  In case of an accidental overdose, call your local [place] at [phone] or visit your local emergency department immediately. This is important. Summary  Taking your medicines correctly is an important part of managing or preventing medical problems.  You need to make sure that you understand what you are taking a medicine for, as well as how and when you need to take it.  Know your medicines and use a pill organizer to help you take your medicines correctly.  In case of an accidental overdose, call your local [place] at [phone] or visit your local emergency department immediately. This is important. This information is not intended to replace advice given to you by your health care provider. Make sure you discuss any questions you have with your health care provider. Document Revised: 11/26/2017 Document Reviewed: 11/26/2017 Elsevier Patient Education  2021 [employer].

## 2021-04-08 NOTE — Chronic Care Management (AMB) (Signed)
Did not receive medication transfer from [facility] after 1st request. Requested again 04/08/21.   Follow-Up:  Comptroller and Pharmacist Review  [name], CPP notified  Margaretmary Dys, [place] [facility] Assistant [phone]

## 2021-04-08 NOTE — Progress Notes (Addendum)
Received voicemail that patient will need lorazepam and muscle relaxer soon from pharmacy. Attempted to contact patient to discuss when she will likely need her tizanidine and lorazepam. Also wanted to review adherence to the following OTC medications: Cetirizine 10mg  Vitamin D 1000 Units Colace 100 mg Glucosamine Chondroitin Joint tablets Niacin 500 mg  Left message for patient to return call. Acute form has been completed for tizanidine and lorazepam and submitted to [employer].   Follow-Up:  Coordination of Enhanced Pharmacy Services and Pharmacist Review  [name], CPP notified  Margaretmary Dys, [place] Pharmacy Assistant [phone]

## 2021-05-22 NOTE — Telephone Encounter (Signed)
I will ask him. He is out of the office this week. I, or he, will get back to you. (Message to pt)

## 2021-05-30 ENCOUNTER — Other Ambulatory Visit: Payer: Self-pay

## 2021-05-30 ENCOUNTER — Inpatient Hospital Stay: Payer: PPO | Attending: Oncology

## 2021-05-30 DIAGNOSIS — I2699 Other pulmonary embolism without acute cor pulmonale: Secondary | ICD-10-CM | POA: Insufficient documentation

## 2021-05-30 DIAGNOSIS — I82432 Acute embolism and thrombosis of left popliteal vein: Secondary | ICD-10-CM | POA: Diagnosis not present

## 2021-05-30 DIAGNOSIS — D509 Iron deficiency anemia, unspecified: Secondary | ICD-10-CM

## 2021-05-30 DIAGNOSIS — Z87891 Personal history of nicotine dependence: Secondary | ICD-10-CM | POA: Diagnosis not present

## 2021-05-30 DIAGNOSIS — Z7901 Long term (current) use of anticoagulants: Secondary | ICD-10-CM | POA: Insufficient documentation

## 2021-05-30 DIAGNOSIS — R918 Other nonspecific abnormal finding of lung field: Secondary | ICD-10-CM | POA: Insufficient documentation

## 2021-05-30 DIAGNOSIS — E538 Deficiency of other specified B group vitamins: Secondary | ICD-10-CM | POA: Insufficient documentation

## 2021-05-30 DIAGNOSIS — I82412 Acute embolism and thrombosis of left femoral vein: Secondary | ICD-10-CM | POA: Diagnosis not present

## 2021-05-30 LAB — CBC WITH DIFFERENTIAL/PLATELET
Abs Immature Granulocytes: 0.02 10*3/uL (ref 0.00–0.07)
Basophils Absolute: 0.1 10*3/uL (ref 0.0–0.1)
Basophils Relative: 1 %
Eosinophils Absolute: 0.2 10*3/uL (ref 0.0–0.5)
Eosinophils Relative: 3 %
HCT: 43.9 % (ref 36.0–46.0)
Hemoglobin: 14.6 g/dL (ref 12.0–15.0)
Immature Granulocytes: 0 %
Lymphocytes Relative: 30 %
Lymphs Abs: 1.6 10*3/uL (ref 0.7–4.0)
MCH: 30.5 pg (ref 26.0–34.0)
MCHC: 33.3 g/dL (ref 30.0–36.0)
MCV: 91.6 fL (ref 80.0–100.0)
Monocytes Absolute: 0.7 10*3/uL (ref 0.1–1.0)
Monocytes Relative: 13 %
Neutro Abs: 2.8 10*3/uL (ref 1.7–7.7)
Neutrophils Relative %: 53 %
Platelets: 231 10*3/uL (ref 150–400)
RBC: 4.79 MIL/uL (ref 3.87–5.11)
RDW: 12.7 % (ref 11.5–15.5)
WBC: 5.2 10*3/uL (ref 4.0–10.5)
nRBC: 0 % (ref 0.0–0.2)

## 2021-05-30 LAB — FERRITIN: Ferritin: 19 ng/mL (ref 11–307)

## 2021-05-30 LAB — VITAMIN B12: Vitamin B-12: 230 pg/mL (ref 180–914)

## 2021-05-30 LAB — IRON AND TIBC
Iron: 120 ug/dL (ref 28–170)
Saturation Ratios: 33 % — ABNORMAL HIGH (ref 10.4–31.8)
TIBC: 367 ug/dL (ref 250–450)
UIBC: 247 ug/dL

## 2021-05-31 ENCOUNTER — Ambulatory Visit: Payer: PPO

## 2021-05-31 ENCOUNTER — Inpatient Hospital Stay: Payer: PPO

## 2021-05-31 ENCOUNTER — Inpatient Hospital Stay: Payer: PPO | Admitting: Oncology

## 2021-05-31 ENCOUNTER — Ambulatory Visit: Payer: PPO | Admitting: Oncology

## 2021-05-31 ENCOUNTER — Encounter: Payer: Self-pay | Admitting: Oncology

## 2021-05-31 VITALS — BP 151/85 | HR 85 | Temp 97.9°F | Resp 20

## 2021-05-31 DIAGNOSIS — D500 Iron deficiency anemia secondary to blood loss (chronic): Secondary | ICD-10-CM | POA: Diagnosis not present

## 2021-05-31 DIAGNOSIS — D509 Iron deficiency anemia, unspecified: Secondary | ICD-10-CM | POA: Diagnosis not present

## 2021-05-31 NOTE — Progress Notes (Signed)
[place]  Telephone:([phone] Fax:([phone]) [phone]  ID: Kynzli C Strout OB: May 20, 1946  MR#: [id number]  TZG#:[id number]  Patient Care Team: [name], MD as PCP - General (Pediatrics) Lloyd Huger, MD as Consulting Physician (Hematology and Oncology) [name], [facility] as Pharmacist (Pharmacist)  CHIEF COMPLAINT: Iron deficiency anemia.  INTERVAL HISTORY: Patient returns to clinic today for routine follow-up for labs.  She last received IV iron on 11/29/2020 and 12/24/2020.  She reviewed her labs last night and does not feel that she needs IV iron.  In the interim, her husband passed away.  She is grieving well but continues to have bad days.  States her husband had several strokes and had been going downhill but he was not expected to pass.   Reports left lower extremity blood clot and recurrent swelling and tightness.  Work-up included a positive left lower extremity ultrasound for DVT and a CTA which showed a chronic pulmonary embolus but nothing acute into right lower lobe nodules around 3 mm in diameter.  30-monthfollow-up if patient is considered moderate to high risk.  She is stable on Eliquis.  REVIEW OF SYSTEMS:   Review of Systems  Constitutional:  Positive for malaise/fatigue. Negative for fever and weight loss.  Respiratory:  Positive for shortness of breath. Negative for cough and hemoptysis.   Cardiovascular: Negative.  Negative for chest pain and leg swelling.  Gastrointestinal: Negative.  Negative for abdominal pain, blood in stool and melena.  Genitourinary: Negative.  Negative for hematuria.  Musculoskeletal: Negative.  Negative for back pain.  Skin: Negative.  Negative for rash.  Neurological:  Positive for weakness. Negative for sensory change, focal weakness and headaches.  Psychiatric/Behavioral:  Positive for depression. The patient is nervous/anxious.    As per HPI. Otherwise, a complete review of systems is  negative.  PAST MEDICAL HISTORY: Past Medical History:  Diagnosis Date   Allergic rhinitis, cause unspecified    Allergy    Anemia    Complication of anesthesia    Diverticulosis    DVT (deep venous thrombosis) ([place]    Gastric ulcer    in past   GERD (gastroesophageal reflux disease)    Hemorrhoids, internal    Hyperlipidemia    Osteoarthrosis involving, or with mention of more than one site, but not specified as generalized, multiple sites    PONV (postoperative nausea and vomiting)    Ulcer (traumatic) of oral mucosa    Unspecified venous (peripheral) insufficiency     PAST SURGICAL HISTORY: Past Surgical History:  Procedure Laterality Date   ABDOMINAL HYSTERECTOMY  1996   BREAST BIOPSY  2003   CHOLECYSTECTOMY  2000   COLONOSCOPY WITH PROPOFOL N/A 10/02/2015   Procedure: COLONOSCOPY WITH PROPOFOL;  Surgeon: MLollie Sails MD;  [facility]  Service: Endoscopy;  Laterality: N/A;   COLONOSCOPY WITH PROPOFOL N/A 06/17/2018   Procedure: COLONOSCOPY WITH PROPOFOL;  Surgeon: Toledo, TBenay Pike MD;  Location: [facility] ENDOSCOPY;  Service: Gastroenterology;  Laterality: N/A;   DEXA  5/14   Normal   ESOPHAGOGASTRODUODENOSCOPY (EGD) WITH PROPOFOL N/A 06/17/2018   Procedure: ESOPHAGOGASTRODUODENOSCOPY (EGD) WITH PROPOFOL;  Surgeon: Toledo, TBenay Pike MD;  Location: [facility] ENDOSCOPY;  Service: Gastroenterology;  Laterality: N/A;   KNEE ARTHROSCOPY     right in 2007, left in 2011   PERIPHERAL VASCULAR THROMBECTOMY Left 08/27/2020   Procedure: PERIPHERAL VASCULAR THROMBECTOMY;  Surgeon: DAlgernon Huxley MD;  [facility];  Service: Cardiovascular;  Laterality: Left;  TONSILLECTOMY AND ADENOIDECTOMY  childhood    FAMILY HISTORY: Family History  Problem Relation Age of Onset   Heart disease Mother    Heart disease Father    Diabetes Son 5       Type 1   Hypertension Son    Stroke Brother    Diabetes Other 2       type 1   Hyperlipidemia Neg Hx     Depression Neg Hx     ADVANCED DIRECTIVES (Y/N):  N  HEALTH MAINTENANCE: Social History   Tobacco Use   Smoking status: Former    Pack years: 0.00    Types: Cigarettes   Smokeless tobacco: Never  Vaping Use   Vaping Use: Never used  Substance Use Topics   Alcohol use: No    Comment: rare glass of wine   Drug use: No     Colonoscopy:  PAP:  Bone density:  Lipid panel:  Allergies  Allergen Reactions   Prevpac [Amoxicill-Clarithro-Lansopraz] Other (See Comments)    Throat closed up   Latex Rash   Duloxetine Other (See Comments)    Nausea, diarrhea and jittery   Tramadol     GI symptoms    Current Outpatient Medications  Medication Sig Dispense Refill   acetaminophen (TYLENOL) 500 MG tablet Take 1,000 mg by mouth every 8 (eight) hours as needed.      albuterol (PROAIR HFA) 108 (90 Base) MCG/ACT inhaler INHALE 2 PUFFS INTO THE LUNGS EVERY 6 (SIX) HOURS AS NEEDED FOR WHEEZING. (Patient taking differently: Inhale 2 puffs into the lungs every 6 (six) hours as needed for wheezing.) 8.5 each 1   apixaban (ELIQUIS) 5 MG TABS tablet Take 1 tablet (5 mg total) by mouth 2 (two) times daily. 180 tablet 2   cetirizine (ZYRTEC) 10 MG tablet Take 10 mg by mouth at bedtime.      cholecalciferol (VITAMIN D) 1000 units tablet Take 1,000 Units by mouth daily.     docusate sodium (COLACE) 100 MG capsule Take 100 mg by mouth daily as needed.      etodolac (LODINE) 500 MG tablet Take 500 mg by mouth 2 (two) times daily.     Glucos-Chondroit-Hyaluron-MSM (GLUCOSAMINE CHONDROITIN JOINT) TABS Take 2 tablets by mouth daily.     LORazepam (ATIVAN) 0.5 MG tablet TAKE 1/2 TO 1 TABLET BY MOUTH AT BEDTIME 30 tablet 0   niacin 500 MG tablet Take 500 mg by mouth daily.     pantoprazole (PROTONIX) 40 MG tablet Take 1 tablet (40 mg total) by mouth 2 (two) times daily. 180 tablet 0   polyethylene glycol (MIRALAX / GLYCOLAX) packet Take 17 g by mouth daily as needed for moderate constipation.       tizanidine (ZANAFLEX) 2 MG capsule Take 1 capsule (2 mg total) by mouth 3 (three) times daily as needed for muscle spasms. 30 capsule 3   No current facility-administered medications for this visit.    OBJECTIVE: Vitals:   05/31/21 1048  BP: (!) 151/85  Pulse: 85  Resp: 20  Temp: 97.9 F (36.6 C)     There is no height or weight on file to calculate BMI.    ECOG FS:0 - Asymptomatic  Physical Exam Constitutional:      Appearance: Normal appearance.  HENT:     Head: Normocephalic and atraumatic.  Eyes:     Pupils: Pupils are equal, round, and reactive to light.  Cardiovascular:     Rate and Rhythm: Normal rate and regular  rhythm.     Heart sounds: Normal heart sounds. No murmur heard. Pulmonary:     Effort: Pulmonary effort is normal.     Breath sounds: Normal breath sounds. No wheezing.  Abdominal:     General: Bowel sounds are normal. There is no distension.     Palpations: Abdomen is soft.     Tenderness: There is no abdominal tenderness.  Musculoskeletal:        General: Normal range of motion.     Cervical back: Normal range of motion.  Skin:    General: Skin is warm and dry.     Findings: No rash.  Neurological:     Mental Status: She is alert and oriented to person, place, and time.  Psychiatric:        Judgment: Judgment normal.     LAB RESULTS:  Lab Results  Component Value Date   NA 140 08/18/2020   K 4.3 08/18/2020   CL 107 08/18/2020   CO2 25 08/18/2020   GLUCOSE 96 08/18/2020   BUN 14 08/27/2020   CREATININE 0.86 08/27/2020   CALCIUM 9.4 08/18/2020   PROT 7.0 08/02/2020   ALBUMIN 4.0 08/02/2020   AST 15 08/02/2020   ALT 13 08/02/2020   ALKPHOS 131 (H) 08/02/2020   BILITOT 0.8 08/02/2020   GFRNONAA >60 08/27/2020   GFRAA >60 08/27/2020    Lab Results  Component Value Date   WBC 5.2 05/30/2021   NEUTROABS 2.8 05/30/2021   HGB 14.6 05/30/2021   HCT 43.9 05/30/2021   MCV 91.6 05/30/2021   PLT 231 05/30/2021   Lab Results  Component  Value Date   IRON 120 05/30/2021   TIBC 367 05/30/2021   IRONPCTSAT 33 (H) 05/30/2021   Lab Results  Component Value Date   FERRITIN 19 05/30/2021     STUDIES: No results found.  ASSESSMENT: Iron deficiency anemia.  PLAN:    1.  Iron deficiency anemia:  Labs from 05/30/2021 show ferritin of 19 and iron saturation of 33%.  Hemoglobin is 14.6. She does not feel like she needs any additional iron at this time. EGD and colonoscopy from 06/17/2018 did not reveal specific source of GI bleed but did note chronic gastritis. She last received IV iron on 12/24/2020. She would like to return to clinic in approximately 3 months for lab work and possible IV Venofer.   2.  B12 deficiency:  B12 level from 05/30/2021 was 230. Recommend she continue B12 injections. Last injection was on 02/28/2021.   3.  Thrombocytopenia:  Resolved.    4.  Family history of multiple myeloma:  Brother has multiple myeloma.   Currently, there is nothing in her laboratory work that suggests she has myeloma.   Can consider SPEP in the future, but this is not necessary at this point.    5.  Incidental lung nodules: Discovered during work-up for blood clot. CTA on 08/18/2020 showed 2 right lower lung nodules measuring 3 mm in diameter.  Recommend 33-monthfollow-up if high risk.  Patient is a former smoker.  Recommend follow-up with repeat CT scan without contrast in 1 to 2 months.  6.  Recurrent PE and DVT: On lifelong Eliquis. Initial blood clot was back in 2011 after knee surgery.  She was placed on Coumadin for greater than 1 year. Had positive DVT back in September 2021 which showed an occlusive DVT extending from left common femoral vein through left popliteal vein. Has questions about genetic component of blood clots. I explained  that this likely would not change treatment moving forward and she will probably be on Eliquis rest of her life.  Patient understood.  Disposition: CT scan without contrast in 1  to 2 months. RTC in 3 months for repeat labs, MD assessment and possible IV iron.  Greater than 50% was spent in counseling and coordination of care with this patient including but not limited to discussion of the relevant topics above (See A&P) including, but not limited to diagnosis and management of acute and chronic medical conditions.   Patient expressed understanding and was in agreement with this plan. She also understands that She can call clinic at any time with any questions, concerns, or complaints.    Jacquelin Hawking, NP   05/31/2021 11:03 AM

## 2021-05-31 NOTE — Progress Notes (Signed)
Patient here today for follow up regarding anemia. Patient denies concerns but would like to discuss getting rescheduled for CT Lung screening visit.

## 2021-06-05 ENCOUNTER — Encounter: Payer: Self-pay | Admitting: Oncology

## 2021-06-27 ENCOUNTER — Encounter: Payer: Self-pay | Admitting: Oncology

## 2021-07-01 ENCOUNTER — Ambulatory Visit
Admission: RE | Admit: 2021-07-01 | Discharge: 2021-07-01 | Disposition: A | Discharge status: Home / Self Care | Payer: PPO | Source: Ambulatory Visit | Attending: Oncology | Admitting: Oncology

## 2021-07-01 ENCOUNTER — Other Ambulatory Visit: Payer: Self-pay

## 2021-07-01 DIAGNOSIS — R918 Other nonspecific abnormal finding of lung field: Secondary | ICD-10-CM | POA: Insufficient documentation

## 2021-07-01 DIAGNOSIS — K5730 Diverticulosis of large intestine without perforation or abscess without bleeding: Secondary | ICD-10-CM | POA: Insufficient documentation

## 2021-07-01 DIAGNOSIS — D500 Iron deficiency anemia secondary to blood loss (chronic): Secondary | ICD-10-CM | POA: Diagnosis not present

## 2021-07-01 DIAGNOSIS — J9811 Atelectasis: Secondary | ICD-10-CM | POA: Diagnosis not present

## 2021-07-01 DIAGNOSIS — K449 Diaphragmatic hernia without obstruction or gangrene: Secondary | ICD-10-CM | POA: Insufficient documentation

## 2021-07-01 DIAGNOSIS — R911 Solitary pulmonary nodule: Secondary | ICD-10-CM | POA: Diagnosis not present

## 2021-07-01 DIAGNOSIS — I700 Atherosclerosis of aorta: Secondary | ICD-10-CM | POA: Insufficient documentation

## 2021-07-02 ENCOUNTER — Other Ambulatory Visit: Payer: Self-pay | Admitting: Oncology

## 2021-07-02 ENCOUNTER — Encounter: Payer: Self-pay | Admitting: Oncology

## 2021-07-02 DIAGNOSIS — R911 Solitary pulmonary nodule: Secondary | ICD-10-CM

## 2021-07-02 NOTE — Progress Notes (Signed)
Re: CT chest   Spoke with patient regarding results from CT scan.  She would like follow-up in 1 year.  Orders placed.  We will get this scheduled.  Faythe Casa, NP 07/02/2021 11:21 AM

## 2021-07-03 DIAGNOSIS — M1711 Unilateral primary osteoarthritis, right knee: Secondary | ICD-10-CM | POA: Diagnosis not present

## 2021-07-03 DIAGNOSIS — M170 Bilateral primary osteoarthritis of knee: Secondary | ICD-10-CM | POA: Diagnosis not present

## 2021-07-03 DIAGNOSIS — M1712 Unilateral primary osteoarthritis, left knee: Secondary | ICD-10-CM | POA: Diagnosis not present

## 2021-07-15 DIAGNOSIS — L718 Other rosacea: Secondary | ICD-10-CM | POA: Diagnosis not present

## 2021-07-15 DIAGNOSIS — L821 Other seborrheic keratosis: Secondary | ICD-10-CM | POA: Diagnosis not present

## 2021-07-18 ENCOUNTER — Telehealth: Payer: Self-pay

## 2021-07-18 NOTE — Progress Notes (Addendum)
Chronic Care Management Pharmacy Assistant   Name: Alice Donaldson  MRN: [id number] DOB: 1946/05/29  Reason for Encounter: Medication Adherence and Delivery Coordination   Recent office visits:  None since last CCM contact  Recent consult visits:  07/03/21 - Orthopedics - Presented for bilateral knee pain. Was given bilateral knee cortisone injections. Discussed viscosupplementation and total knee replacement. No medication changes. No follow up noted.  05/31/21 - Oncology - Presented for follow up on anemia. Labs ordered for 08/2021: CBC with Diff, Ferritin, Iron and TIBC. Ordered CT chest. Follow up 3 months for repeat labs and possible IV iron.  Hospital visits:  None in previous 6 months  Medications: Outpatient Encounter Medications as of 07/18/2021  Medication Sig   acetaminophen (TYLENOL) 500 MG tablet Take 1,000 mg by mouth every 8 (eight) hours as needed.    albuterol (PROAIR HFA) 108 (90 Base) MCG/ACT inhaler INHALE 2 PUFFS INTO THE LUNGS EVERY 6 (SIX) HOURS AS NEEDED FOR WHEEZING. (Patient taking differently: Inhale 2 puffs into the lungs every 6 (six) hours as needed for wheezing.)   apixaban (ELIQUIS) 5 MG TABS tablet Take 1 tablet (5 mg total) by mouth 2 (two) times daily.   cetirizine (ZYRTEC) 10 MG tablet Take 10 mg by mouth at bedtime.    cholecalciferol (VITAMIN D) 1000 units tablet Take 1,000 Units by mouth daily.   docusate sodium (COLACE) 100 MG capsule Take 100 mg by mouth daily as needed.    etodolac (LODINE) 500 MG tablet Take 500 mg by mouth 2 (two) times daily.   Glucos-Chondroit-Hyaluron-MSM (GLUCOSAMINE CHONDROITIN JOINT) TABS Take 2 tablets by mouth daily.   LORazepam (ATIVAN) 0.5 MG tablet TAKE 1/2 TO 1 TABLET BY MOUTH AT BEDTIME   niacin 500 MG tablet Take 500 mg by mouth daily.   pantoprazole (PROTONIX) 40 MG tablet Take 1 tablet (40 mg total) by mouth 2 (two) times daily.   polyethylene glycol (MIRALAX / GLYCOLAX) packet Take 17 g by mouth daily as needed  for moderate constipation.    tizanidine (ZANAFLEX) 2 MG capsule Take 1 capsule (2 mg total) by mouth 3 (three) times daily as needed for muscle spasms.   No facility-administered encounter medications on file as of 07/18/2021.   BP Readings from Last 3 Encounters:  05/31/21 (!) 151/85  12/24/20 (!) 152/83  11/29/20 128/79    Lab Results  Component Value Date   HGBA1C 6.1 04/18/2015     Last adherence delivery date: N/A first adherence fill      Patient is due for next adherence delivery on: 07/29/21  Spoke with patient on 07/19/21 reviewed medications and coordinated delivery.  This delivery to include: Vials  90 Days  Eliquis 5 mg - 1 tablet twice daily (1 morning and 1 evening) Etodolac 500 mg- 1 tablet twice daily (1 morning and 1 evening) PRN- wants 30 day supply Tizanidine 2 mg- 1 capsule 3 times daily PRN   Patient declined the following medications this month: Pantoprazole 40 mg- 1 tablet twice daily (1 morning and 1 evening)- has about 30 hand- set up acute delivery for 08/15/21 Lorazepam 0.5 mg - 0.5 - 1 tablet daily (bedtime)- not needed lately- states she is sleeping well  Will continue to buy OTC Cetirizine '10mg'$  Vitamin D 1000 Units Colace 100 mg Glucosamine Chondroitin Joint tablets Niacin 500 mg Biotin 5000 mcg- 1 tablet daily   Any concerns about your medications? No  How often do you forget or accidentally miss a dose?  Never  Do you use a pillbox? Yes  Is patient in packaging No  Refills requested from PCP include:  Etodolac 500 mg  Confirmed delivery date of 07/29/21, advised patient that pharmacy will contact them the morning of delivery.  Recent blood pressure readings are as follows: Does not monitor  Alice Donaldson, CPP notified  Margaretmary Dys, [place] Pharmacy Assistant [phone]  I have reviewed the care management and care coordination activities outlined in this encounter and I am certifying that I agree with the content of this  note. May encourage her to check some BPs at home given last few clinic readings elevated and on BID NSAID.  Alice Donaldson, PharmD Clinical Pharmacist [facility] at [facility] [phone]

## 2021-07-19 ENCOUNTER — Other Ambulatory Visit: Payer: Self-pay

## 2021-07-19 NOTE — Telephone Encounter (Signed)
-----   Message from [place] sent at 07/19/2021  3:36 PM EDT ----- Regarding: Refill Patient requested refill on etodolac 500 mg - please send to [facility] if appropriate.  Thank you, Margaretmary Dys

## 2021-07-19 NOTE — Telephone Encounter (Signed)
Need to clarify with Dr Silvio Pate if this okay with her Eliquis. I believe I may have asked this before, but I want to make sure.

## 2021-07-20 MED ORDER — ETODOLAC 500 MG PO TABS: 500.0000 mg | ORAL_TABLET | Freq: Two times a day (BID) | ORAL | 1 refills | Status: DC | PRN

## 2021-07-20 NOTE — Telephone Encounter (Signed)
Please let her know that I think she can take this despite the eliquis (since she is on protonix), but she should try to limit the quantity (like several a week only if possible)

## 2021-07-22 NOTE — Telephone Encounter (Signed)
Spoke to pt. She will do her best.

## 2021-07-23 ENCOUNTER — Other Ambulatory Visit: Payer: Self-pay | Admitting: Internal Medicine

## 2021-07-23 NOTE — Telephone Encounter (Signed)
Please set her up for a AMW in the next few months

## 2021-07-23 NOTE — Telephone Encounter (Signed)
Last filled 08-02-20 #30 Last OV 08-02-20 No Future OV [facility]

## 2021-07-25 NOTE — Telephone Encounter (Signed)
Called and spoke to pt. Advised her she needed a MCW. She said she was aware and will call back soon. Having an emergency at her home right now.

## 2021-07-26 ENCOUNTER — Other Ambulatory Visit (INDEPENDENT_AMBULATORY_CARE_PROVIDER_SITE_OTHER): Payer: Self-pay | Admitting: Nurse Practitioner

## 2021-07-26 ENCOUNTER — Telehealth (INDEPENDENT_AMBULATORY_CARE_PROVIDER_SITE_OTHER): Payer: Self-pay | Admitting: Vascular Surgery

## 2021-07-26 MED ORDER — APIXABAN 5 MG PO TABS: 5.0000 mg | ORAL_TABLET | Freq: Two times a day (BID) | ORAL | 2 refills | Status: DC

## 2021-07-26 NOTE — Telephone Encounter (Signed)
Refill sent.

## 2021-07-26 NOTE — Telephone Encounter (Signed)
Called stating that patient needs a new Rx for Eliquis. Patient will need full 90-day supply for insurance purposes. Patient was last seen 11/2020. Please advise.

## 2021-07-29 ENCOUNTER — Telehealth (INDEPENDENT_AMBULATORY_CARE_PROVIDER_SITE_OTHER): Payer: Self-pay | Admitting: Nurse Practitioner

## 2021-07-29 NOTE — Telephone Encounter (Signed)
I have had a discussion with the patient on repeated basis in regards to the difference between Plavix and Eliquis.  We certainly can switch the patient over to Plavix given the length of time its been since her DVT however she should be aware that this medicine does not prevent further recurrence of DVTs and she can get another DVT while on Plavix.  Plavix is antiplatelet and it is more so for people who have issues with peripheral arterial disease with stents placed and things of that nature.  If the patient still wishes to have Plavix more than happy to send it.  But she needs to understand that it is not a true replacement for Eliquis, it works in a much different manner.

## 2021-07-29 NOTE — Telephone Encounter (Signed)
Jantoven  & Clopidogrel are the 2 medications the insurance will cover. Patient states that she does prefer Clopidogrel over the other medication if possible.

## 2021-07-29 NOTE — Telephone Encounter (Signed)
Patient was made aware with medical recommendations and verbalized that she will contact the office when she make her finally decision

## 2021-07-29 NOTE — Telephone Encounter (Signed)
Patient was advise to contact insurance company to check on which blood thinner they will cover. After receiving information the patient will contact the office

## 2021-07-29 NOTE — Telephone Encounter (Signed)
Patient called in stating she is needing a more affordable medication option then Eliquis.  Patient is stating she can not afford it.  Please advise.

## 2021-08-07 ENCOUNTER — Encounter: Payer: PPO | Admitting: Internal Medicine

## 2021-08-08 ENCOUNTER — Telehealth: Payer: PPO

## 2021-08-08 NOTE — Telephone Encounter (Signed)
Patient called to informed that she will continue with taking Eliquis. Patient wanted me to [facility] about medication and they informed that the patient will be due for refill on 08/28/21. Patient was made aware with information.

## 2021-08-20 ENCOUNTER — Encounter: Payer: Self-pay | Admitting: *Deleted

## 2021-08-21 ENCOUNTER — Telehealth: Payer: Self-pay

## 2021-08-21 NOTE — Progress Notes (Addendum)
Chronic Care Management Pharmacy Assistant   Name: Alice Donaldson  MRN: [id number] DOB: [birth year]   Reason for Encounter: Medication Adherence and Delivery Coordination   Recent office visits:  07/29/2021 - Patient telephone call. Patient called office asking for more affordable medication than Eliquis. Patient was advised to contact insurance company. PT informs office that insurance will pay for Jantoven and Clopidogrel. Office explains to patient the difference in medications. Patient verbalized that she will contact the office when she has made a final decision.   Recent consult visits:  None since last CCM contact  Hospital visits:  None in previous 6 months  Medications: Outpatient Encounter Medications as of 08/21/2021  Medication Sig   acetaminophen (TYLENOL) 500 MG tablet Take 1,000 mg by mouth every 8 (eight) hours as needed.    albuterol (PROAIR HFA) 108 (90 Base) MCG/ACT inhaler INHALE 2 PUFFS INTO THE LUNGS EVERY 6 (SIX) HOURS AS NEEDED FOR WHEEZING. (Patient taking differently: Inhale 2 puffs into the lungs every 6 (six) hours as needed for wheezing.)   apixaban (ELIQUIS) 5 MG TABS tablet Take 1 tablet (5 mg total) by mouth 2 (two) times daily.   cetirizine (ZYRTEC) 10 MG tablet Take 10 mg by mouth at bedtime.    cholecalciferol (VITAMIN D) 1000 units tablet Take 1,000 Units by mouth daily.   docusate sodium (COLACE) 100 MG capsule Take 100 mg by mouth daily as needed.    etodolac (LODINE) 500 MG tablet Take 1 tablet (500 mg total) by mouth 2 (two) times daily as needed.   Glucos-Chondroit-Hyaluron-MSM (GLUCOSAMINE CHONDROITIN JOINT) TABS Take 2 tablets by mouth daily.   LORazepam (ATIVAN) 0.5 MG tablet TAKE 1/2 TO 1 TABLET BY MOUTH AT BEDTIME   niacin 500 MG tablet Take 500 mg by mouth daily.   pantoprazole (PROTONIX) 40 MG tablet Take 1 tablet (40 mg total) by mouth 2 (two) times daily.   polyethylene glycol (MIRALAX / GLYCOLAX) packet Take 17 g by mouth daily as  needed for moderate constipation.    tiZANidine (ZANAFLEX) 2 MG tablet Take 1 tablet (2 mg total) by mouth at bedtime as needed for muscle spasms.   No facility-administered encounter medications on file as of 08/21/2021.   BP Readings from Last 3 Encounters:  05/31/21 (!) 151/85  12/24/20 (!) 152/83  11/29/20 128/79    Lab Results  Component Value Date   HGBA1C 6.1 04/18/2015      No OVs, Consults, or hospital visits since last care coordination call / Pharmacist visit. No medication changes indicated   Last adherence delivery date:07/29/2021      Patient is due for next adherence delivery on: 08/28/2021  Spoke with patient on 08/21/2021 and reviewed medications. Patient denied the need for any medications at this time.  This delivery to include: Vials  90 Days  Packs: No packs  VIAL medications: This delivery to include: Vials  Patient would like for medications to be 90 Days  Eliquis 5 mg - 1 tablet twice daily (1 morning and 1 evening) Etodolac 500 mg- 1 tablet twice daily (1 morning and 1 evening) Tizanidine 2 mg- 1 capsule 3 times daily (PRN - only 1 at bedtime) Lorazepam 0.5 mg - 0.5 - 1 tablet daily (bedtime)  Medication not due until 10/28/2021 Pantoprazole 40 mg- 1 tablet twice daily (1 morning and 1 evening)  Patient declined the following medications this month: Patient did not decline any medications.   Will continue to buy OTC Cetirizine '10mg'$  Vitamin  D 1000 Units Colace 100 mg Glucosamine Chondroitin Joint tablets Niacin 500 mg Biotin 5000 mcg- 1 tablet daily   Any concerns about your medications? No  How often do you forget or accidentally miss a dose? Rarely  Do you use a pillbox? Yes  Is patient in packaging No   Confirmed delivery date of 08/28/2021, advised patient that pharmacy will contact them the morning of delivery.08/28/2021  Recent blood pressure readings are as follows: n/a Patient is not asked to take readings.   Recent blood  glucose readings are as follows: n/a Patient is not asked to take readings.   [name], CPP notified  [name], [place] Clinical Pharmacy Assistant [phone]  I have reviewed the care management and care coordination activities outlined in this encounter and I am certifying that I agree with the content of this note. No further action required.  [name], PharmD Clinical Pharmacist [facility] at [facility] [phone]

## 2021-08-23 ENCOUNTER — Other Ambulatory Visit: Payer: Self-pay | Admitting: Internal Medicine

## 2021-08-23 NOTE — Telephone Encounter (Signed)
Last filled 08-02-21 #30 Last OV 08-02-20 No Future [facility]

## 2021-08-30 NOTE — Telephone Encounter (Signed)
Spoke to pt. Advised her we are doing Flu Clinics on Fridays. Offered to make appt today but she was not available. She will call the office to r/s.

## 2021-09-01 NOTE — Progress Notes (Signed)
[place]  [phone] [phone]  ID: [name] OB: 12-12-1946  MR#: [id number]  LHT#:[id number]  Patient Care Team: [name], MD as PCP - General (Pediatrics) Lloyd Huger, MD as Consulting Physician (Hematology and Oncology) [name], [facility] as Pharmacist (Pharmacist)  CHIEF COMPLAINT: Iron deficiency anemia.  INTERVAL HISTORY: Patient returns to clinic today for repeat laboratory work, further evaluation, and consideration of additional IV iron.  She currently feels well and is asymptomatic.  She does not complain of any weakness or fatigue today. She has no neurologic complaints.  She denies any recent fevers or illnesses. She has a good appetite and denies weight loss.  She denies any chest pain, shortness of breath, cough, or hemoptysis.  She denies any nausea, vomiting, constipation, or diarrhea. She denies any melena or hematochezia. She has no urinary complaints.  Patient offers no specific complaints today.  REVIEW OF SYSTEMS:   Review of Systems  Constitutional: Negative.  Negative for fever, malaise/fatigue and weight loss.  Respiratory: Negative.  Negative for cough, hemoptysis and shortness of breath.   Cardiovascular: Negative.  Negative for chest pain and leg swelling.  Gastrointestinal: Negative.  Negative for abdominal pain, blood in stool and melena.  Genitourinary: Negative.  Negative for hematuria.  Musculoskeletal: Negative.  Negative for back pain.  Skin: Negative.  Negative for rash.  Neurological: Negative.  Negative for sensory change, focal weakness, weakness and headaches.  Psychiatric/Behavioral: Negative.  The patient is not nervous/anxious.    As per HPI. Otherwise, a complete review of systems is negative.  PAST MEDICAL HISTORY: Past Medical History:  Diagnosis Date   Allergic rhinitis, cause unspecified    Allergy    Anemia    Complication of anesthesia    Diverticulosis    DVT  (deep venous thrombosis) ([place])    Gastric ulcer    in past   GERD (gastroesophageal reflux disease)    Hemorrhoids, internal    Hyperlipidemia    Osteoarthrosis involving, or with mention of more than one site, but not specified as generalized, multiple sites    PONV (postoperative nausea and vomiting)    Ulcer (traumatic) of oral mucosa    Unspecified venous (peripheral) insufficiency     PAST SURGICAL HISTORY: Past Surgical History:  Procedure Laterality Date   ABDOMINAL HYSTERECTOMY  1996   BREAST BIOPSY  2003   CHOLECYSTECTOMY  2000   COLONOSCOPY WITH PROPOFOL N/A 10/02/2015   Procedure: COLONOSCOPY WITH PROPOFOL;  Surgeon: Lollie Sails, MD;  Location: [facility] ENDOSCOPY;  Service: Endoscopy;  Laterality: N/A;   COLONOSCOPY WITH PROPOFOL N/A 06/17/2018   Procedure: COLONOSCOPY WITH PROPOFOL;  Surgeon: Toledo, Benay Pike, MD;  Location: [facility] ENDOSCOPY;  Service: Gastroenterology;  Laterality: N/A;   DEXA  5/14   Normal   ESOPHAGOGASTRODUODENOSCOPY (EGD) WITH PROPOFOL N/A 06/17/2018   Procedure: ESOPHAGOGASTRODUODENOSCOPY (EGD) WITH PROPOFOL;  Surgeon: Toledo, Benay Pike, MD;  Location: [facility] ENDOSCOPY;  Service: Gastroenterology;  Laterality: N/A;   KNEE ARTHROSCOPY     right in 2007, left in 2011   PERIPHERAL VASCULAR THROMBECTOMY Left 08/27/2020   Procedure: PERIPHERAL VASCULAR THROMBECTOMY;  Surgeon: Algernon Huxley, MD;  Location: [facility] LAB;  Service: Cardiovascular;  Laterality: Left;   TONSILLECTOMY AND ADENOIDECTOMY  childhood    FAMILY HISTORY: Family History  Problem Relation Age of Onset   Heart disease Mother    Heart disease Father    Diabetes Son 5       Type 1  Hypertension Son    Stroke Brother    Diabetes Other 2       type 1   Hyperlipidemia Neg Hx    Depression Neg Hx     ADVANCED DIRECTIVES (Y/N):  N  HEALTH MAINTENANCE: Social History   Tobacco Use   Smoking status: Former    Types: Cigarettes   Smokeless tobacco: Never  Vaping Use    Vaping Use: Never used  Substance Use Topics   Alcohol use: No    Comment: rare glass of wine   Drug use: No     Colonoscopy:  PAP:  Bone density:  Lipid panel:  Allergies  Allergen Reactions   Prevpac [Amoxicill-Clarithro-Lansopraz] Other (See Comments)    Throat closed up   Latex Rash   Duloxetine Other (See Comments)    Nausea, diarrhea and jittery   Tramadol     GI symptoms    Current Outpatient Medications  Medication Sig Dispense Refill   acetaminophen (TYLENOL) 500 MG tablet Take 1,000 mg by mouth every 8 (eight) hours as needed.      albuterol (PROAIR HFA) 108 (90 Base) MCG/ACT inhaler INHALE 2 PUFFS INTO THE LUNGS EVERY 6 (SIX) HOURS AS NEEDED FOR WHEEZING. (Patient taking differently: Inhale 2 puffs into the lungs every 6 (six) hours as needed for wheezing.) 8.5 each 1   apixaban (ELIQUIS) 5 MG TABS tablet Take 1 tablet (5 mg total) by mouth 2 (two) times daily. 180 tablet 2   cetirizine (ZYRTEC) 10 MG tablet Take 10 mg by mouth at bedtime.      cholecalciferol (VITAMIN D) 1000 units tablet Take 1,000 Units by mouth daily.     docusate sodium (COLACE) 100 MG capsule Take 100 mg by mouth daily as needed.      etodolac (LODINE) 500 MG tablet TAKE ONE TABLET BY MOUTH TWICE DAILY AS NEEDED 180 tablet 0   Glucos-Chondroit-Hyaluron-MSM (GLUCOSAMINE CHONDROITIN JOINT) TABS Take 2 tablets by mouth daily.     LORazepam (ATIVAN) 0.5 MG tablet TAKE 1/2 TO 1 TAB BY MOUTH EVERYDAY AT BEDTIME 30 tablet 0   Menthol-Methyl Salicylate (SALONPAS PAIN RELIEF PATCH EX) Apply topically. prn     niacin 500 MG tablet Take 500 mg by mouth daily.     pantoprazole (PROTONIX) 40 MG tablet Take 1 tablet (40 mg total) by mouth 2 (two) times daily. 180 tablet 0   polyethylene glycol (MIRALAX / GLYCOLAX) packet Take 17 g by mouth daily as needed for moderate constipation.      tiZANidine (ZANAFLEX) 2 MG tablet Take 1 tablet (2 mg total) by mouth at bedtime as needed for muscle spasms. 90 tablet 0    No current facility-administered medications for this visit.    OBJECTIVE: Vitals:   09/03/21 1428  BP: (!) 155/87  Pulse: 74  Resp: 18  Temp: 97.6 F (36.4 C)     Body mass index is 39.99 kg/m.    ECOG FS:0 - Asymptomatic  General: Well-developed, well-nourished, no acute distress. Eyes: Pink conjunctiva, anicteric sclera. HEENT: Normocephalic, moist mucous membranes. Lungs: No audible wheezing or coughing. Heart: Regular rate and rhythm. Abdomen: Soft, nontender, no obvious distention. Musculoskeletal: No edema, cyanosis, or clubbing. Neuro: Alert, answering all questions appropriately. Cranial nerves grossly intact. Skin: No rashes or petechiae noted. Psych: Normal affect.   LAB RESULTS:  Lab Results  Component Value Date   NA 140 08/18/2020   K 4.3 08/18/2020   CL 107 08/18/2020   CO2 25 08/18/2020  GLUCOSE 96 08/18/2020   BUN 14 08/27/2020   CREATININE 0.86 08/27/2020   CALCIUM 9.4 08/18/2020   PROT 7.0 08/02/2020   ALBUMIN 4.0 08/02/2020   AST 15 08/02/2020   ALT 13 08/02/2020   ALKPHOS 131 (H) 08/02/2020   BILITOT 0.8 08/02/2020   GFRNONAA >60 08/27/2020   GFRAA >60 08/27/2020    Lab Results  Component Value Date   WBC 5.4 09/03/2021   NEUTROABS 3.0 09/03/2021   HGB 14.4 09/03/2021   HCT 44.7 09/03/2021   MCV 91.4 09/03/2021   PLT 247 09/03/2021   Lab Results  Component Value Date   IRON 110 09/03/2021   TIBC 419 09/03/2021   IRONPCTSAT 26 09/03/2021   Lab Results  Component Value Date   FERRITIN 13 09/03/2021     STUDIES: No results found.  ASSESSMENT: Iron deficiency anemia.  PLAN:    1.  Iron deficiency anemia: Patient's hemoglobin and iron stores are now within normal limits.  Her most recent EGD and colonoscopy on June 17, 2018 did not reveal specific source of GI bleed, but did note chronic gastritis.  She does not require additional IV iron today.  Her last dose of IV Feraheme was on December 24, 2020.  For insurance  purposes, patient's treatment will be switched to IV Venofer if needed.  Return to clinic in 4 months with repeat laboratory work, further evaluation, and continuation of treatment if needed.   2.  B12 deficiency: Resolved.  Patient last received B12 injection on February 28, 2021.   3.  Thrombocytopenia: Resolved.   4.  Family history of multiple myeloma: Brother has multiple myeloma.  Currently, there is nothing in her laboratory work that suggests she has myeloma.  Can consider SPEP in the future, but this is not necessary at this point.     Patient expressed understanding and was in agreement with this plan. She also understands that She can call clinic at any time with any questions, concerns, or complaints.    Lloyd Huger, MD   09/06/2021 6:07 AM

## 2021-09-03 ENCOUNTER — Encounter: Payer: Self-pay | Admitting: Oncology

## 2021-09-03 ENCOUNTER — Inpatient Hospital Stay: Payer: PPO | Attending: Oncology

## 2021-09-03 ENCOUNTER — Inpatient Hospital Stay: Payer: PPO

## 2021-09-03 ENCOUNTER — Inpatient Hospital Stay: Payer: PPO | Admitting: Oncology

## 2021-09-03 VITALS — BP 155/87 | HR 74 | Temp 97.6°F | Resp 18 | Wt 240.3 lb

## 2021-09-03 DIAGNOSIS — Z79899 Other long term (current) drug therapy: Secondary | ICD-10-CM | POA: Insufficient documentation

## 2021-09-03 DIAGNOSIS — D509 Iron deficiency anemia, unspecified: Secondary | ICD-10-CM | POA: Insufficient documentation

## 2021-09-03 DIAGNOSIS — Z87891 Personal history of nicotine dependence: Secondary | ICD-10-CM | POA: Diagnosis not present

## 2021-09-03 DIAGNOSIS — E538 Deficiency of other specified B group vitamins: Secondary | ICD-10-CM | POA: Insufficient documentation

## 2021-09-03 DIAGNOSIS — Z807 Family history of other malignant neoplasms of lymphoid, hematopoietic and related tissues: Secondary | ICD-10-CM | POA: Diagnosis not present

## 2021-09-03 LAB — CBC WITH DIFFERENTIAL/PLATELET
Abs Immature Granulocytes: 0.03 10*3/uL (ref 0.00–0.07)
Basophils Absolute: 0.1 10*3/uL (ref 0.0–0.1)
Basophils Relative: 1 %
Eosinophils Absolute: 0.2 10*3/uL (ref 0.0–0.5)
Eosinophils Relative: 3 %
HCT: 44.7 % (ref 36.0–46.0)
Hemoglobin: 14.4 g/dL (ref 12.0–15.0)
Immature Granulocytes: 1 %
Lymphocytes Relative: 30 %
Lymphs Abs: 1.6 10*3/uL (ref 0.7–4.0)
MCH: 29.4 pg (ref 26.0–34.0)
MCHC: 32.2 g/dL (ref 30.0–36.0)
MCV: 91.4 fL (ref 80.0–100.0)
Monocytes Absolute: 0.6 10*3/uL (ref 0.1–1.0)
Monocytes Relative: 11 %
Neutro Abs: 3 10*3/uL (ref 1.7–7.7)
Neutrophils Relative %: 54 %
Platelets: 247 10*3/uL (ref 150–400)
RBC: 4.89 MIL/uL (ref 3.87–5.11)
RDW: 12.5 % (ref 11.5–15.5)
WBC: 5.4 10*3/uL (ref 4.0–10.5)
nRBC: 0 % (ref 0.0–0.2)

## 2021-09-03 LAB — IRON AND TIBC
Iron: 110 ug/dL (ref 28–170)
Saturation Ratios: 26 % (ref 10.4–31.8)
TIBC: 419 ug/dL (ref 250–450)
UIBC: 309 ug/dL

## 2021-09-03 LAB — FERRITIN: Ferritin: 13 ng/mL (ref 11–307)

## 2021-09-03 LAB — VITAMIN B12: Vitamin B-12: 216 pg/mL (ref 180–914)

## 2021-09-03 NOTE — Progress Notes (Signed)
Patient denies new problems/concerns today.   °

## 2021-09-04 DIAGNOSIS — M170 Bilateral primary osteoarthritis of knee: Secondary | ICD-10-CM | POA: Diagnosis not present

## 2021-09-06 ENCOUNTER — Ambulatory Visit (INDEPENDENT_AMBULATORY_CARE_PROVIDER_SITE_OTHER): Payer: PPO

## 2021-09-06 ENCOUNTER — Encounter: Payer: Self-pay | Admitting: Oncology

## 2021-09-06 ENCOUNTER — Other Ambulatory Visit: Payer: Self-pay

## 2021-09-06 DIAGNOSIS — Z23 Encounter for immunization: Secondary | ICD-10-CM | POA: Diagnosis not present

## 2021-09-09 IMAGING — CT CT CHEST W/O CM
2 of 4 series · 15 of 36 positions shown, 18 images · non-contrast
Comparison: 08/18/2020 chest CT angiogram.

CLINICAL DATA: Follow-up right lower lobe pulmonary nodules. Remote
smoking history.

EXAM:
CT CHEST WITHOUT CONTRAST
TECHNIQUE: Multidetector CT imaging of the chest was performed following the
standard protocol without IV contrast.

[Series 2: chest 2.00 · axial · 0.67mm/px · z∈[-1188,-912]mm · 12 of 164 slices shown, 15 images]
[im 13/164  mediastinal]
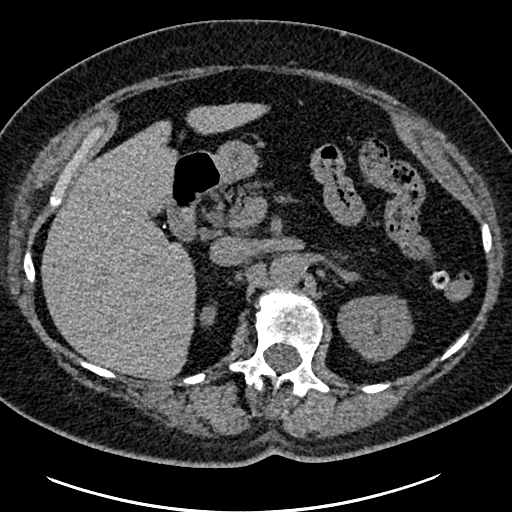
[im 13/164  lung]
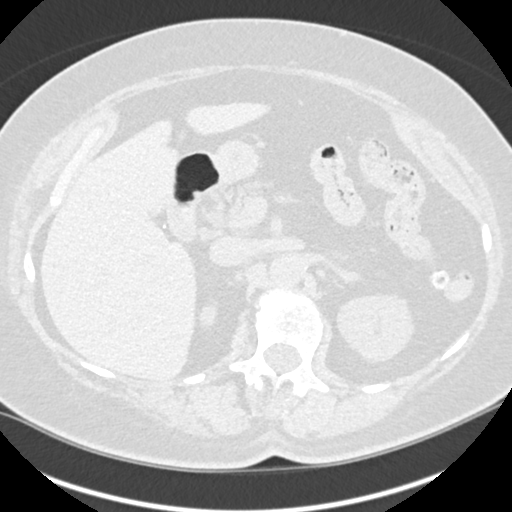
[im 26/164  lung]
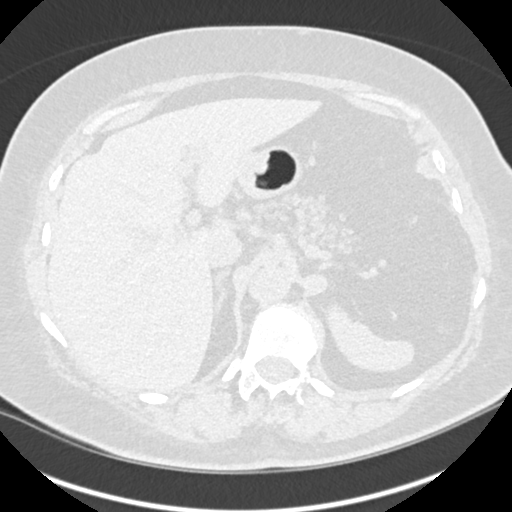
[im 38/164  lung]
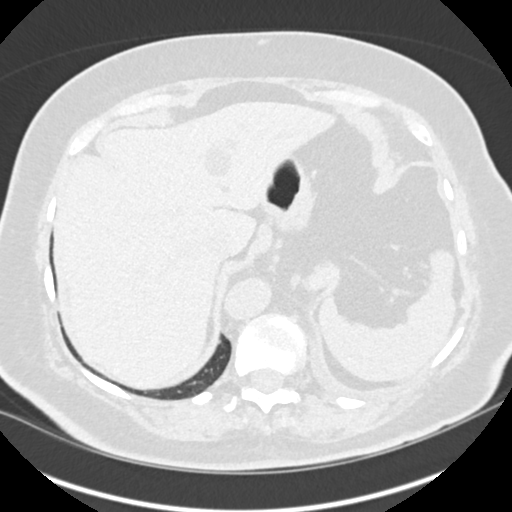
[im 51/164  lung]
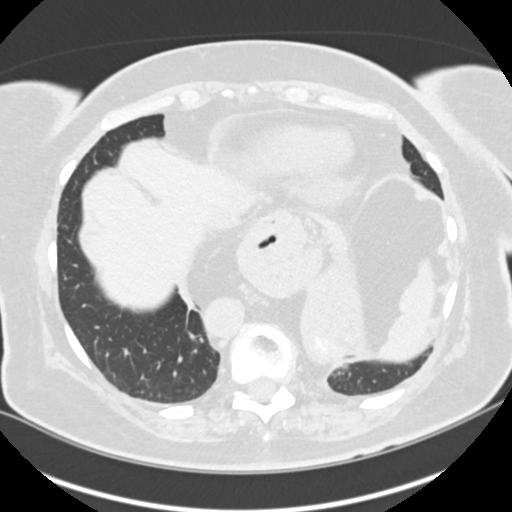
[im 63/164  mediastinal]
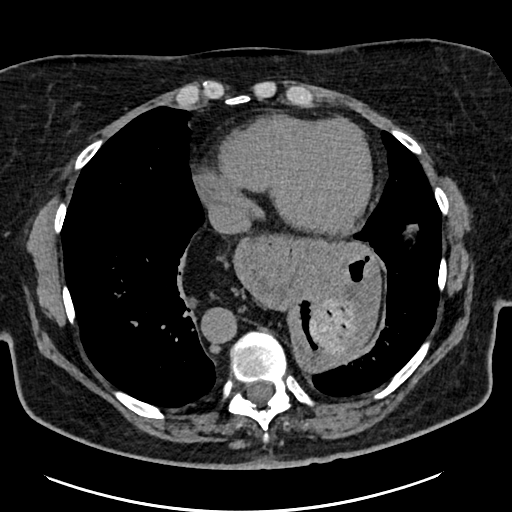
[im 63/164  lung]
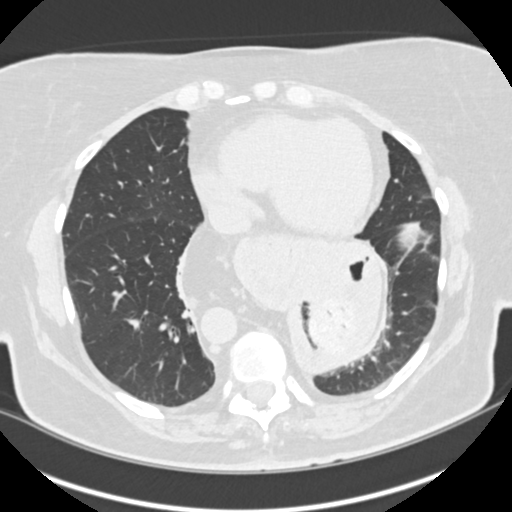
[im 76/164  lung]
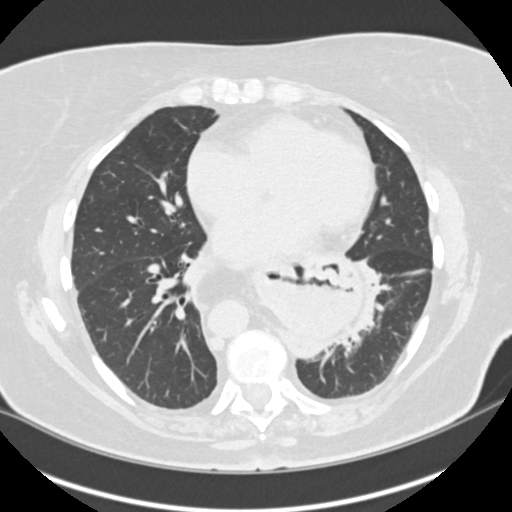
[im 88/164  lung]
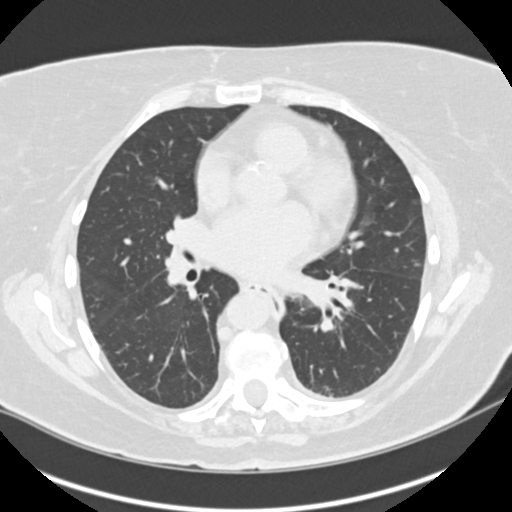
[im 101/164  lung]
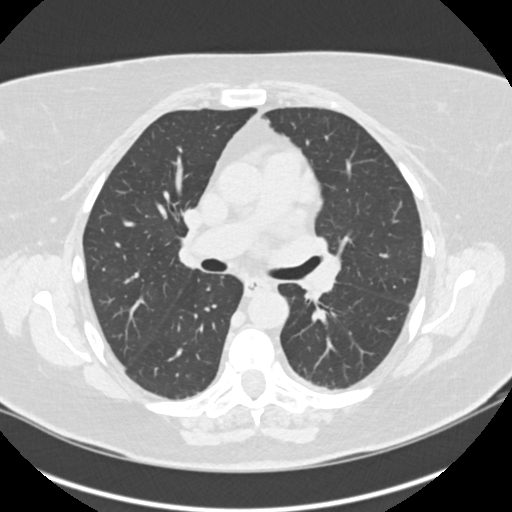
[im 113/164  mediastinal]
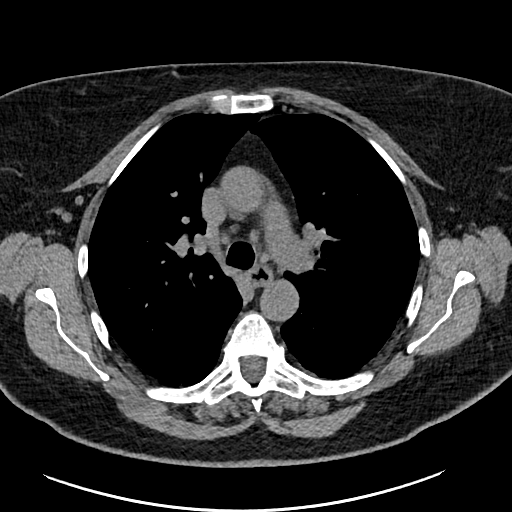
[im 113/164  lung]
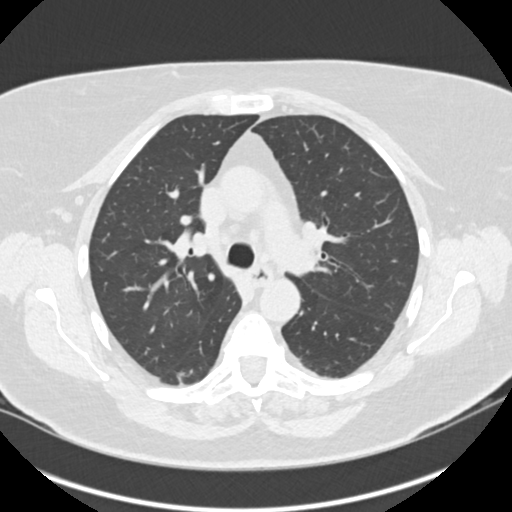
[im 126/164  lung]
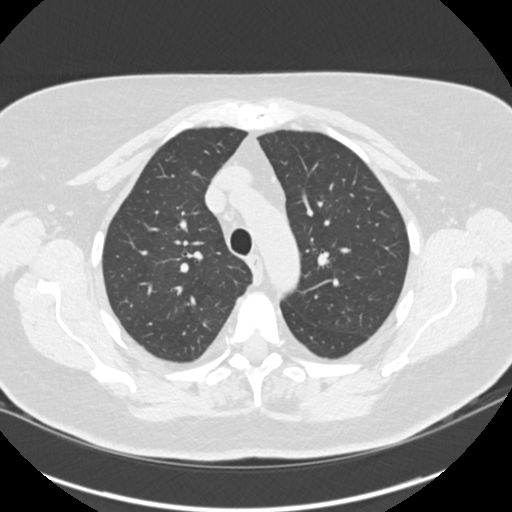
[im 138/164  lung]
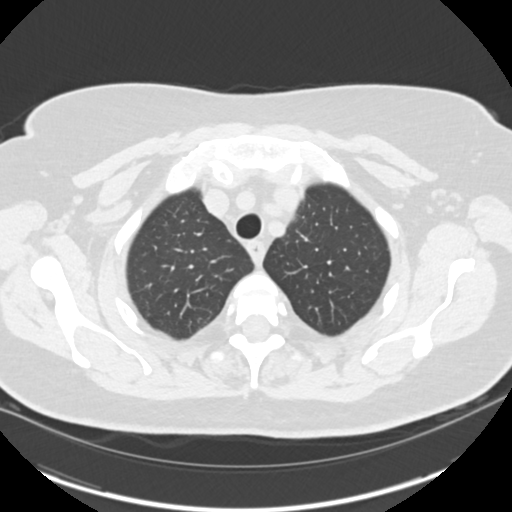
[im 151/164  lung]
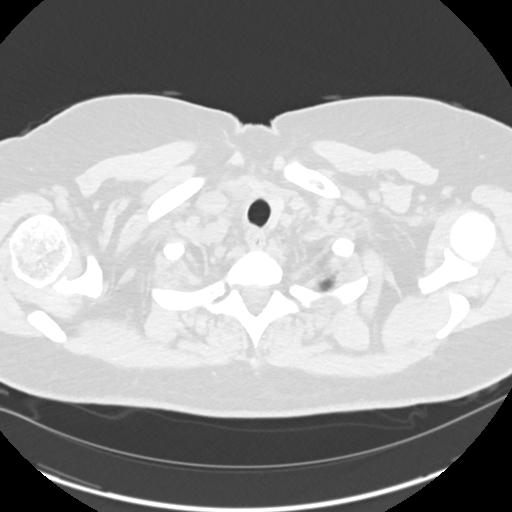

[Series 5: coronals chest 2.00 cor · coronal · 0.64mm/px · 3 of 149 slices shown]
[im 30/149  lung]
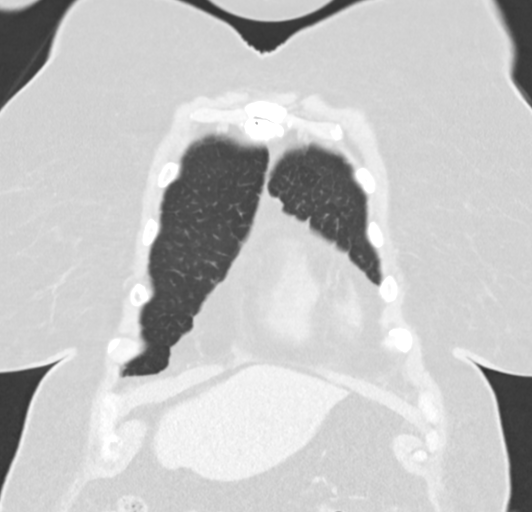
[im 60/149  lung]
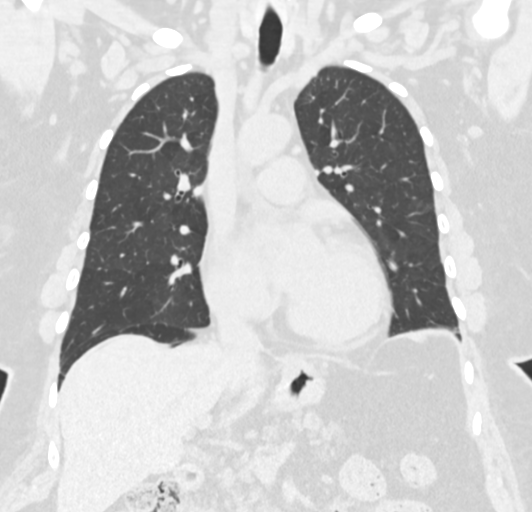
[im 89/149  lung]
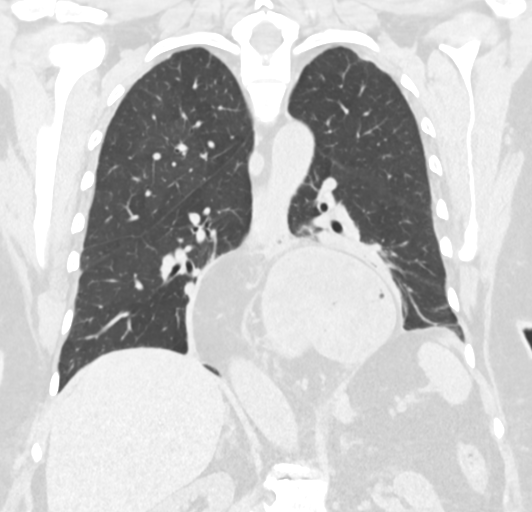

[15 of 36 positions shown; findings below may reference images not displayed]

FINDINGS: Cardiovascular: Normal heart size. No significant pericardial
effusion/thickening. Mildly atherosclerotic nonaneurysmal thoracic
aorta. Normal caliber pulmonary arteries.

Mediastinum/Nodes: No discrete thyroid nodules. Unremarkable
esophagus. No pathologically enlarged axillary, mediastinal or hilar
lymph nodes, noting limited sensitivity for the detection of hilar
adenopathy on this noncontrast study.

Lungs/Pleura: No pneumothorax. No pleural effusion. Compressive
atelectasis in the medial lower lobes due to the hiatal hernia, left
greater than right, similar. No acute consolidative airspace disease
or lung masses. A few scattered small solid bilateral lower lobe
pulmonary nodules, largest 4 mm in the posterior right lower lobe
(series 3/image 87), all stable and probably benign. No new
significant pulmonary nodules.

Upper abdomen: Large hiatal hernia. Stomach is decompressed. Simple
3.4 cm left liver cyst. Cholecystectomy. Colonic diverticulosis.

Musculoskeletal: No aggressive appearing focal osseous lesions.
Moderate lower thoracic spondylosis.
IMPRESSION: 1. Scattered small solid bilateral lower lobe pulmonary nodules are
all stable since 08/18/2020 chest CT angiogram study and probably
benign. A final follow-up noncontrast chest CT may be considered in
15 months given the history of smoking.
2. Large hiatal hernia.
3. Colonic diverticulosis.
4. Aortic Atherosclerosis ([id number]-[id number]).

## 2021-09-11 DIAGNOSIS — M170 Bilateral primary osteoarthritis of knee: Secondary | ICD-10-CM | POA: Diagnosis not present

## 2021-09-18 ENCOUNTER — Telehealth: Payer: Self-pay

## 2021-09-18 DIAGNOSIS — M170 Bilateral primary osteoarthritis of knee: Secondary | ICD-10-CM | POA: Diagnosis not present

## 2021-09-18 NOTE — Progress Notes (Addendum)
    Chronic Care Management Pharmacy Assistant   Name: Jamilette C Sebree  MRN: [id number] DOB: 23-May-1946  Reason for Encounter: Patient Assistance for [place]   10/10/21 - Patient left voicemail with questions on how to fill out PAP form. Called patient; no answer; left message.   10/08/2021 - Called patient to verify she received PAP forms. She could not speak with me; will call me back tomorrow.   10/02/2021 -  Called patient to follow up on PAP forms. [place] does [place] know if she got them. She will check when she gets home and call me back tomorrow to let me know.   09/18/21 - Spoke with patient. She would like to try for patient assistance for Eliquis ([facility]MS). PAP form has been completed and uploaded. Patient asked for PAP forms to be mailed to her.   [name], CPP notified  [name], [place] Clinical Pharmacy Assistant [phone]

## 2021-09-18 NOTE — Progress Notes (Addendum)
Chronic Care Management Pharmacy Assistant   Name: Alice Donaldson  MRN: [id number] DOB: 03/26/1946  Reason for Encounter: General Adherence   Recent office visits:  None since last CCM contact  Recent consult visits:  09/04/2021 - Vance Peper, PA - Patient presented for bilateral primary osteoarthritis of knee. Large joint injection performed: Medications (Right): 30 mg sodium hyaluronate 30 mg/2 mL; 5 mL lidocaine 1 % Medications (Left): 30 mg sodium hyaluronate 30 mg/2 mL; 5 mL lidocaine 1 %. 09/03/2021 - Delight Hoh, MD - Patient presneted to clinic today for repeat laboratory work, further evaluation, and consideration of additional IV iron.   Hospital visits:  None in previous 6 months  Medications: Outpatient Encounter Medications as of 09/18/2021  Medication Sig   acetaminophen (TYLENOL) 500 MG tablet Take 1,000 mg by mouth every 8 (eight) hours as needed.    albuterol (PROAIR HFA) 108 (90 Base) MCG/ACT inhaler INHALE 2 PUFFS INTO THE LUNGS EVERY 6 (SIX) HOURS AS NEEDED FOR WHEEZING. (Patient taking differently: Inhale 2 puffs into the lungs every 6 (six) hours as needed for wheezing.)   apixaban (ELIQUIS) 5 MG TABS tablet Take 1 tablet (5 mg total) by mouth 2 (two) times daily.   cetirizine (ZYRTEC) 10 MG tablet Take 10 mg by mouth at bedtime.    cholecalciferol (VITAMIN D) 1000 units tablet Take 1,000 Units by mouth daily.   docusate sodium (COLACE) 100 MG capsule Take 100 mg by mouth daily as needed.    etodolac (LODINE) 500 MG tablet TAKE ONE TABLET BY MOUTH TWICE DAILY AS NEEDED   Glucos-Chondroit-Hyaluron-MSM (GLUCOSAMINE CHONDROITIN JOINT) TABS Take 2 tablets by mouth daily.   LORazepam (ATIVAN) 0.5 MG tablet TAKE 1/2 TO 1 TAB BY MOUTH EVERYDAY AT BEDTIME   Menthol-Methyl Salicylate (SALONPAS PAIN RELIEF PATCH EX) Apply topically. prn   niacin 500 MG tablet Take 500 mg by mouth daily.   pantoprazole (PROTONIX) 40 MG tablet Take 1 tablet (40 mg total) by mouth 2  (two) times daily.   polyethylene glycol (MIRALAX / GLYCOLAX) packet Take 17 g by mouth daily as needed for moderate constipation.    tiZANidine (ZANAFLEX) 2 MG tablet Take 1 tablet (2 mg total) by mouth at bedtime as needed for muscle spasms.   No facility-administered encounter medications on file as of 09/18/2021.    Contacted Nadja C Hollen on 09/18/2021 for general disease state and medication adherence call.   Star Medications: Medication Name/mg Last Fill Days Supply None listed in chart  What concerns do you have about your medications? Yes. Patient is concerned with cost of Eliquis  The patient denies side effects with her medications.   How often do you forget or accidentally miss a dose? Rarely  Do you use a pillbox? Yes  Are you having any problems getting your medications from your pharmacy? No  Has the cost of your medications been a concern? Yes Patient states she is in the doughnut hole and pays 25% of the Eliquis cost. Patient just paid $360 for 3 months. Patient is unsure if she would qualify for patient assistance. Her 2021 tax forms will include her husband (who has since passed). Patient would like to try anyway. I advised her I would complete the forms and get them mailed to her. Advised her I would call back next week to follow up and make sure she received the forms. Patient stated if she doesn't get approved this time she will reapply when she gets her next tax  forms because it will only be her.   Since last visit with CPP, no interventions have been made:   The patient has not had an ED visit since last contact.   The patient denies problems with their health.   she denies  concerns or questions for [name], Pharm. D at this time.   Counseled patient on:  Access to CCM team for any cost, medication or pharmacy concerns.  Care Gaps: Annual wellness visit in last year? No 08/02/2020 Most Recent BP reading: 155/87 on 09/03/2021  PAP for Eliquis has  been sent to [facility] 09/18/2021.  No appointments scheduled within the next 30 days.  [name], CPP notified  [name], [place] Clinical Pharmacy Assistant [phone]  I have reviewed the care management and care coordination activities outlined in this encounter and I am certifying that I agree with the content of this note. No further action required.  [name], PharmD Clinical Pharmacist [facility] at [facility] [phone]

## 2021-09-20 NOTE — Telephone Encounter (Signed)
Application reviewed and mailed to patient.  [name], PharmD Clinical Pharmacist [facility] at [facility] [phone]

## 2021-09-25 NOTE — Progress Notes (Addendum)
Eliquis PAP forms have been mailed to patient.  [name], CPP notified  Avel Sensor, [place] Assistant [phone]

## 2021-10-02 ENCOUNTER — Telehealth: Payer: Self-pay

## 2021-10-02 NOTE — Progress Notes (Signed)
    Chronic Care Management Pharmacy Assistant   Name: Alice Donaldson  MRN: [id number] DOB: Mar 02, 1946  Entered in Error

## 2021-10-15 ENCOUNTER — Other Ambulatory Visit: Payer: Self-pay | Admitting: Internal Medicine

## 2021-10-15 MED ORDER — LORAZEPAM 0.5 MG PO TABS: ORAL_TABLET | ORAL | 0 refills | Status: DC

## 2021-10-15 NOTE — Addendum Note (Signed)
Addended by: Pilar Grammes on: 10/15/2021 10:46 AM   Modules accepted: Orders

## 2021-10-15 NOTE — Addendum Note (Signed)
Addended by: Viviana Simpler I on: 10/15/2021 01:02 PM   Modules accepted: Orders

## 2021-10-15 NOTE — Telephone Encounter (Signed)
She needs to schedule her AMW in the next few months

## 2021-10-15 NOTE — Telephone Encounter (Signed)
LAst fill date unlisted Last OV 08-02-20 No Future OV Upstream

## 2021-10-16 ENCOUNTER — Telehealth: Payer: Self-pay

## 2021-10-16 NOTE — Progress Notes (Addendum)
Chronic Care Management Pharmacy Assistant   Name: Alice Donaldson  MRN: [id number] DOB: 1946/10/07  Reason for Encounter: CCM (Medication Adherence and Delivery Coordination)   Recent office visits:  None since last CCM contact  Recent consult visits:  None since last CCM contact  Hospital visits:  None in previous 6 months  Medications: Outpatient Encounter Medications as of 10/16/2021  Medication Sig   acetaminophen (TYLENOL) 500 MG tablet Take 1,000 mg by mouth every 8 (eight) hours as needed.    albuterol (PROAIR HFA) 108 (90 Base) MCG/ACT inhaler INHALE 2 PUFFS INTO THE LUNGS EVERY 6 (SIX) HOURS AS NEEDED FOR WHEEZING. (Patient taking differently: Inhale 2 puffs into the lungs every 6 (six) hours as needed for wheezing.)   apixaban (ELIQUIS) 5 MG TABS tablet Take 1 tablet (5 mg total) by mouth 2 (two) times daily.   cetirizine (ZYRTEC) 10 MG tablet Take 10 mg by mouth at bedtime.    cholecalciferol (VITAMIN D) 1000 units tablet Take 1,000 Units by mouth daily.   docusate sodium (COLACE) 100 MG capsule Take 100 mg by mouth daily as needed.    etodolac (LODINE) 500 MG tablet TAKE ONE TABLET BY MOUTH TWICE DAILY AS NEEDED   Glucos-Chondroit-Hyaluron-MSM (GLUCOSAMINE CHONDROITIN JOINT) TABS Take 2 tablets by mouth daily.   LORazepam (ATIVAN) 0.5 MG tablet TAKE 1/2 TO 1 TABLET BY MOUTH EVERYDAY AT BEDTIME   Menthol-Methyl Salicylate (SALONPAS PAIN RELIEF PATCH EX) Apply topically. prn   niacin 500 MG tablet Take 500 mg by mouth daily.   pantoprazole (PROTONIX) 40 MG tablet Take 1 tablet (40 mg total) by mouth 2 (two) times daily. NEEDS OFFICE VISIT   polyethylene glycol (MIRALAX / GLYCOLAX) packet Take 17 g by mouth daily as needed for moderate constipation.    tiZANidine (ZANAFLEX) 2 MG tablet Take 1 tablet (2 mg total) by mouth at bedtime as needed for muscle spasms.   No facility-administered encounter medications on file as of 10/16/2021.   BP Readings from Last 3  Encounters:  09/03/21 (!) 155/87  05/31/21 (!) 151/85  12/24/20 (!) 152/83    Lab Results  Component Value Date   HGBA1C 6.1 04/18/2015    No OVs, Consults, or hospital visits since last care coordination call / Pharmacist visit. No medication changes indicated  Last adherence delivery date: 08/28/2021      Patient is due for next adherence delivery on: 10/28/2021  Spoke with patient on 10/23/2021 reviewed medications and coordinated delivery.  This delivery to include: Vials  90 Days  Packs: No Packs  VIAL medications: Patient would like medications to be 90 days and synced  Pantoprazole 40 mg - 1 tab every morning and evening 38 DS sent Lorazepam 0.5 mg - 0.5 - 1 tablet daily (bedtime) 30 DS sent   Patient declined the following medications this month: Patient did not decline any medications this month.   Will continue to buy OTC Cetirizine 10mg  Vitamin D 1000 Units Colace 100 mg Glucosamine Chondroitin Joint tablets Niacin 500 mg Biotin 5000 mcg- 1 tablet daily   Any concerns about your medications? No  How often do you forget or accidentally miss a dose? Never  Do you use a pillbox? Yes  Is patient in packaging No  Refills requested from providers include: Lorazepam 0.5 mg - 0.5 - 1 tablet daily (bedtime)  Confirmed delivery date of 10/28/2021, advised patient that pharmacy will contact them the morning of delivery.   Recent blood pressure readings are as  follows: n/a Patient is not asked to take readings.   Recent blood glucose readings are as follows: n/a Patient is not asked to take readings.   Annual wellness visit in last year? No Most Recent BP reading: 155/87 on 09/03/2021  Spoke with patient; she is not feeling well. Symptoms started on 10/17/2021. Patient had been visiting her Alice Donaldson who was sick at the time (he was not tested for Covid). Symptoms include: Fatigue, headache, sore throat, cough, fever off and on (has not actually taken it),  nasal congestion/pressure and wheezing. Treatment has included; Tylenol, Dayquil, Nyquil and Albuterol. I asked patient to take an at home Covid test. She stated she would and wanted me to call her back tomorrow morning to find out results. I also advised calling Dr. Alla German office for an appointment.   [name], CPP notified  [name], [place] Clinical Pharmacy Assistant [phone]   I have reviewed the care management and care coordination activities outlined in this encounter and I am certifying that I agree with the content of this note. Pt should check home BP given her clinic readings are elevated. Will follow up in 2 weeks.  [name], PharmD Clinical Pharmacist [facility] at [facility] [phone]

## 2021-10-24 NOTE — Telephone Encounter (Addendum)
Spoke to pt. 1 week with sore throat, cough, nasal congestion.. Using her albuterol inhaler.   Made appt in January for AWV

## 2021-10-24 NOTE — Progress Notes (Signed)
10/24/2021 - Called patient to confirm if she wants a 38 day supply of Pantoprazole to line up with her Eliquis. Also called to inform patient Lorazepam has to stay a 30 day supply as that what it was written for. Also, to find how patient is feeling and if she took Covid test. No answer; left message. Time Spent: 3 Minutes  [name], CPP notified  [name], [place] Assistant [phone]

## 2021-10-25 NOTE — Progress Notes (Signed)
10/25/2021 - Spoke with patient; she would like a 38 day supply of Pantoprazole so that it lines up with her Eliquis.. I also informed patient that her Lorazepam has to stay 30ds. Patient stated she was feeling better and she is on the mend.   [name], CPP notified  [name], [place] Clinical Pharmacy Assistant [phone]  Time Spent:  5 Minutes

## 2021-11-06 ENCOUNTER — Encounter: Payer: Self-pay | Admitting: Internal Medicine

## 2021-11-11 ENCOUNTER — Telehealth: Payer: Self-pay

## 2021-11-11 NOTE — Progress Notes (Signed)
Chronic Care Management Pharmacy Assistant   Name: Alice Donaldson  MRN: [id number] DOB: 1945/12/27  Reason for Encounter: CCM (Blood Pressure - Hypertension)   Recent office visits:  None since last CCM contact  Recent consult visits:  None since last CCM contact  Hospital visits:  None since last CCM contact  Medications: Outpatient Encounter Medications as of 11/11/2021  Medication Sig   acetaminophen (TYLENOL) 500 MG tablet Take 1,000 mg by mouth every 8 (eight) hours as needed.    albuterol (PROAIR HFA) 108 (90 Base) MCG/ACT inhaler INHALE 2 PUFFS INTO THE LUNGS EVERY 6 (SIX) HOURS AS NEEDED FOR WHEEZING. (Patient taking differently: Inhale 2 puffs into the lungs every 6 (six) hours as needed for wheezing.)   apixaban (ELIQUIS) 5 MG TABS tablet Take 1 tablet (5 mg total) by mouth 2 (two) times daily.   cetirizine (ZYRTEC) 10 MG tablet Take 10 mg by mouth at bedtime.    cholecalciferol (VITAMIN D) 1000 units tablet Take 1,000 Units by mouth daily.   docusate sodium (COLACE) 100 MG capsule Take 100 mg by mouth daily as needed.    etodolac (LODINE) 500 MG tablet TAKE ONE TABLET BY MOUTH TWICE DAILY AS NEEDED   Glucos-Chondroit-Hyaluron-MSM (GLUCOSAMINE CHONDROITIN JOINT) TABS Take 2 tablets by mouth daily.   LORazepam (ATIVAN) 0.5 MG tablet TAKE 1/2 TO 1 TABLET BY MOUTH EVERYDAY AT BEDTIME   Menthol-Methyl Salicylate (SALONPAS PAIN RELIEF PATCH EX) Apply topically. prn   niacin 500 MG tablet Take 500 mg by mouth daily.   pantoprazole (PROTONIX) 40 MG tablet Take 1 tablet (40 mg total) by mouth 2 (two) times daily. NEEDS OFFICE VISIT   polyethylene glycol (MIRALAX / GLYCOLAX) packet Take 17 g by mouth daily as needed for moderate constipation.    tiZANidine (ZANAFLEX) 2 MG tablet Take 1 tablet (2 mg total) by mouth at bedtime as needed for muscle spasms.   No facility-administered encounter medications on file as of 11/11/2021.   Recent Office Vitals: BP Readings from Last 3  Encounters:  09/03/21 (!) 155/87  05/31/21 (!) 151/85  12/24/20 (!) 152/83   Pulse Readings from Last 3 Encounters:  09/03/21 74  05/31/21 85  12/24/20 60    Wt Readings from Last 3 Encounters:  09/03/21 240 lb 4.8 oz (109 kg)  11/29/20 235 lb (106.6 kg)  11/21/20 223 lb (101.2 kg)    Kidney Function Lab Results  Component Value Date/Time   CREATININE 0.86 08/27/2020 01:05 PM   CREATININE 0.90 08/18/2020 11:45 AM   CREATININE 1.03 05/12/2014 09:36 AM   GFR 53.93 (L) 08/02/2020 03:19 PM   GFRNONAA >60 08/27/2020 01:05 PM   GFRNONAA 55 (L) 05/12/2014 09:36 AM   GFRAA >60 08/27/2020 01:05 PM   GFRAA >60 05/12/2014 09:36 AM   BMP Latest Ref Rng & Units 08/27/2020 08/18/2020 08/02/2020  Glucose 70 - 99 mg/dL - 96 95  BUN 8 - 23 mg/dL 14 24(H) 19  Creatinine 0.44 - 1.00 mg/dL 0.86 0.90 1.00  Sodium 135 - 145 mmol/L - 140 142  Potassium 3.5 - 5.1 mmol/L - 4.3 4.6  Chloride 98 - 111 mmol/L - 107 107  CO2 22 - 32 mmol/L - 25 26  Calcium 8.9 - 10.3 mg/dL - 9.4 10.0   Attempted to contact patient on 11/28 and 11/29 for blood pressure reading. Unsuccessful outreach.  Spoke with patient on 11/11/2021; patient could not talk as she was driving; she asked to call me back later in the  afternoon. Spoke with patient on 11/12/2021; she was unavailable and asked if she could return my call later in the day. I attempted contact with the patient before I left for the day; no answer.   Adherence Review: Is the patient currently on ACE/ARB medication? No Does the patient have >5 day gap between last estimated fill dates? Patient is not currently taking any Star Rating Drugs.   Star Rating Drugs:  Medication:  Last Fill: Day Supply None listed in chart  Care Gaps: Annual wellness visit in last year? No 08/02/2020 Most Recent BP reading: 155/87 on 09/03/2021  No appointments scheduled within the next 30 days.  [name], CPP notified  [name], [place] Clinical Pharmacy  Assistant [phone]

## 2021-11-12 ENCOUNTER — Other Ambulatory Visit: Payer: Self-pay | Admitting: Internal Medicine

## 2021-11-13 NOTE — Telephone Encounter (Signed)
Refill request for LORazepam (ATIVAN) 0.5 MG tablet  LOV - 08/02/20 Next OV - 01/02/22 Last refill - 10/15/21 #30/0

## 2021-11-14 ENCOUNTER — Other Ambulatory Visit: Payer: Self-pay | Admitting: Internal Medicine

## 2021-11-15 ENCOUNTER — Telehealth: Payer: Self-pay

## 2021-11-15 NOTE — Progress Notes (Addendum)
Chronic Care Management Pharmacy Assistant   Name: Alice Donaldson  MRN: [id number] DOB: 05-27-1946  Reason for Encounter: CCM (Medication Adherence and Delivery Coordination)   Recent office visits:  None since last CCM contact  Recent consult visits:  None since last CCM contact  Hospital visits:  None in previous 6 months  Medications: Outpatient Encounter Medications as of 11/15/2021  Medication Sig   acetaminophen (TYLENOL) 500 MG tablet Take 1,000 mg by mouth every 8 (eight) hours as needed.    albuterol (PROAIR HFA) 108 (90 Base) MCG/ACT inhaler INHALE 2 PUFFS INTO THE LUNGS EVERY 6 (SIX) HOURS AS NEEDED FOR WHEEZING. (Patient taking differently: Inhale 2 puffs into the lungs every 6 (six) hours as needed for wheezing.)   apixaban (ELIQUIS) 5 MG TABS tablet Take 1 tablet (5 mg total) by mouth 2 (two) times daily.   cetirizine (ZYRTEC) 10 MG tablet Take 10 mg by mouth at bedtime.    cholecalciferol (VITAMIN D) 1000 units tablet Take 1,000 Units by mouth daily.   docusate sodium (COLACE) 100 MG capsule Take 100 mg by mouth daily as needed.    etodolac (LODINE) 500 MG tablet TAKE ONE TABLET BY MOUTH TWICE DAILY AS NEEDED   Glucos-Chondroit-Hyaluron-MSM (GLUCOSAMINE CHONDROITIN JOINT) TABS Take 2 tablets by mouth daily.   LORazepam (ATIVAN) 0.5 MG tablet TAKE 1/2 TO 1 TABLET BY MOUTH EVERYDAY AT BEDTIME   Menthol-Methyl Salicylate (SALONPAS PAIN RELIEF PATCH EX) Apply topically. prn   niacin 500 MG tablet Take 500 mg by mouth daily.   pantoprazole (PROTONIX) 40 MG tablet Take 1 tablet (40 mg total) by mouth 2 (two) times daily. NEEDS OFFICE VISIT   polyethylene glycol (MIRALAX / GLYCOLAX) packet Take 17 g by mouth daily as needed for moderate constipation.    tiZANidine (ZANAFLEX) 2 MG tablet TAKE ONE TABLET BY MOUTH AT BEDTIME AS NEEDED FOR muscle SPASMS   No facility-administered encounter medications on file as of 11/15/2021.   BP Readings from Last 3 Encounters:  09/03/21  (!) 155/87  05/31/21 (!) 151/85  12/24/20 (!) 152/83    Lab Results  Component Value Date   HGBA1C 6.1 04/18/2015    No OVs, Consults, or hospital visits since last care coordination call / Pharmacist visit. No medication changes indicated  Last adherence delivery date: 10/28/2021  Patient is due for next adherence delivery on: 11/26/2021  Spoke with patient on 11/15/2021 reviewed medications and coordinated delivery.  This delivery to include: Vials  90 Days  Packs: No Packs   VIAL medications: Patient would like medications to be 90 days and synced  Pantoprazole 40 mg - 1 tab every morning and evening Etodolac 500 mg- 1 tablet twice daily (1 morning and 1 evening) Tizanidine 2 mg- 1 capsule 3 times daily (PRN - only 1 at bedtime) Eliquis 5 mg - 1 tablet twice daily (1 morning and 1 evening) Lorazepam 0.5 mg - 0.5 - 1 tablet daily (bedtime)    Patient declined the following medications this month: Patient did not decline any medications this month.    Will continue to buy OTC Cetirizine 10mg  Vitamin D 1000 Units Colace 100 mg Glucosamine Chondroitin Joint tablets Niacin 500 mg Biotin 5000 mcg- 1 tablet daily    Any concerns about your medications? No  How often do you forget or accidentally miss a dose? Never  Do you use a pillbox? Yes  Is patient in packaging No   Refills requested from providers include: Pantoprazole 40 mg -  1 tab every morning and evening Etodolac 500 mg- 1 tablet twice daily (1 morning and 1 evening) Tizanidine 2 mg- 1 capsule 3 times daily (PRN - only 1 at bedtime) Lorazepam 0.5 mg - 0.5 - 1 tablet daily (bedtime)  Confirmed delivery date of 11/26/2021, advised patient that pharmacy will contact them the morning of delivery.   Recent blood pressure readings are as follows: N/A Patient does not have a blood pressure cuff at home.  Recent blood glucose readings are as follows: N/A, no diabetes diagnosis  Patient states she signed up for  a nursing service through [facility]. They are coming to her house on 11/26/2021 to assess her overall health and her medical needs.   I spoke with the patient in regards to her Eliquis patient assistance paperwork. Patient did receive it, but has decided not to apply. Patient stated according to the income requirements she makes too much. When patient files her taxes in April she will apply if her income has come down. Patient stated she is ok with paying for the Eliquis at this time.   Annual wellness visit in last year? No Most Recent BP reading: 155/87 on 09/03/2021   [name], CPP notified  [name], [place] Assistant [phone]  I have reviewed the care management and care coordination activities outlined in this encounter and I am certifying that I agree with the content of this note. No further action required.  [name], PharmD Clinical Pharmacist [facility] [place]   Time Spent:  40 Minutes

## 2021-11-15 NOTE — Progress Notes (Signed)
I spoke with the patient in regards to her Eliquis patient assistance paperwork. Patient did receive it, but has decided not to apply. Patient stated according to the income requirements she makes too much. When patient files her taxes in April she will apply if her income has come down. Patient stated she is ok with paying for the Eliquis at this time.   [name], CPP notified  [name], [place] Clinical Pharmacy Assistant [phone]

## 2021-11-18 ENCOUNTER — Other Ambulatory Visit: Payer: Self-pay | Admitting: Internal Medicine

## 2021-12-16 ENCOUNTER — Other Ambulatory Visit: Payer: Self-pay | Admitting: Internal Medicine

## 2021-12-31 ENCOUNTER — Other Ambulatory Visit: Payer: Self-pay | Admitting: *Deleted

## 2021-12-31 DIAGNOSIS — D509 Iron deficiency anemia, unspecified: Secondary | ICD-10-CM

## 2022-01-02 ENCOUNTER — Other Ambulatory Visit: Payer: Self-pay

## 2022-01-02 ENCOUNTER — Ambulatory Visit (INDEPENDENT_AMBULATORY_CARE_PROVIDER_SITE_OTHER): Payer: PPO | Admitting: Internal Medicine

## 2022-01-02 ENCOUNTER — Encounter: Payer: Self-pay | Admitting: Internal Medicine

## 2022-01-02 VITALS — BP 130/80 | HR 81 | Temp 97.6°F | Ht 65.0 in | Wt 245.0 lb

## 2022-01-02 DIAGNOSIS — M48062 Spinal stenosis, lumbar region with neurogenic claudication: Secondary | ICD-10-CM

## 2022-01-02 DIAGNOSIS — I700 Atherosclerosis of aorta: Secondary | ICD-10-CM | POA: Diagnosis not present

## 2022-01-02 DIAGNOSIS — I2699 Other pulmonary embolism without acute cor pulmonale: Secondary | ICD-10-CM

## 2022-01-02 DIAGNOSIS — F39 Unspecified mood [affective] disorder: Secondary | ICD-10-CM

## 2022-01-02 DIAGNOSIS — Z0000 Encounter for general adult medical examination without abnormal findings: Secondary | ICD-10-CM

## 2022-01-02 NOTE — Assessment & Plan Note (Signed)
On imaging Did have bad muscle symptoms with statin in the past--will hold off

## 2022-01-02 NOTE — Progress Notes (Signed)
Hearing Screening - Comments:: Passed whisper test Vision Screening - Comments:: Appt in March 2023. Aware vision is off due to cataracts

## 2022-01-02 NOTE — Assessment & Plan Note (Signed)
I have personally reviewed the Medicare Annual Wellness questionnaire and have noted 1. The patient's medical and social history 2. Their use of alcohol, tobacco or illicit drugs 3. Their current medications and supplements 4. The patient's functional ability including ADL's, fall risks, home safety risks and hearing or visual             impairment. 5. Diet and physical activities 6. Evidence for depression or mood disorders  The patients weight, height, BMI and visual acuity have been recorded in the chart I have made referrals, counseling and provided education to the patient based review of the above and I have provided the pt with a written personalized care plan for preventive services.  I have provided you with a copy of your personalized plan for preventive services. Please take the time to review along with your updated medication list.  Not sure about bivalent COVID vaccine Discussed Td and shingrix at pharmacy Needs to work on lifestyle Last colon in 2024 Needs mammogram--every 2 years till 62

## 2022-01-02 NOTE — Progress Notes (Signed)
Subjective:    Patient ID: Alice Donaldson, female    DOB: 03/28/1946, 76 y.o.   MRN: [id number]  HPI Here for Medicare wellness visit and follow up of chronic health conditions Reviewed advanced directives Reviewed other doctors--Mr [facility], Dr Candie Chroman, Dr Dew--vascular, dentist? No surgery or hospitalizations in past year Vision is poor due to cataracts---will take care of these soon Hearing is okay Not really exercising No falls Mild mood issues Gets groceries prepared for pick up, she does other instrumental ADLs No sig memory problems---mild issues (not as sharp)  Still grieving Mostly doing okay with husband's death --last 2023/03/20 Socially involved--managing okay No daily depressed feelings  Last DVT was a bit over a year ago Did have clot in lung also On eliquis--plan indefinite Rx  Back "kills me all the time" Has had shots in knees---didn't help Relates a lot of this to her weight--and fell in the past Continues on lodine and glucosamine/chodroitin  Takes pantoprazole daily No heartburn or dysphagia on this  BMI still 40 Very frustrated---plans to try herbalife  Current Outpatient Medications on File Prior to Visit  Medication Sig Dispense Refill   acetaminophen (TYLENOL) 500 MG tablet Take 1,000 mg by mouth every 8 (eight) hours as needed.      albuterol (PROAIR HFA) 108 (90 Base) MCG/ACT inhaler INHALE 2 PUFFS INTO THE LUNGS EVERY 6 (SIX) HOURS AS NEEDED FOR WHEEZING. (Patient taking differently: Inhale 2 puffs into the lungs every 6 (six) hours as needed for wheezing.) 8.5 each 1   apixaban (ELIQUIS) 5 MG TABS tablet Take 1 tablet (5 mg total) by mouth 2 (two) times daily. 180 tablet 2   cetirizine (ZYRTEC) 10 MG tablet Take 10 mg by mouth at bedtime.      cholecalciferol (VITAMIN D) 1000 units tablet Take 1,000 Units by mouth daily.     docusate sodium (COLACE) 100 MG capsule Take 100 mg by mouth daily as needed.      etodolac (LODINE) 500  MG tablet TAKE ONE TABLET BY MOUTH TWICE DAILY AS NEEDED 180 tablet 0   Glucos-Chondroit-Hyaluron-MSM (GLUCOSAMINE CHONDROITIN JOINT) TABS Take 2 tablets by mouth daily.     LORazepam (ATIVAN) 0.5 MG tablet TAKE 1/2 TO 1 TABLET BY MOUTH EVERYDAY AT BEDTIME 30 tablet 0   Menthol-Methyl Salicylate (SALONPAS PAIN RELIEF PATCH EX) Apply topically. prn     niacin 500 MG tablet Take 500 mg by mouth daily.     pantoprazole (PROTONIX) 40 MG tablet TAKE ONE TABLET BY MOUTH TWICE DAILY. NEEDS OFFICE VISIT FOR FURTHER REFILLS 180 tablet 0   polyethylene glycol (MIRALAX / GLYCOLAX) packet Take 17 g by mouth daily as needed for moderate constipation.      tiZANidine (ZANAFLEX) 2 MG tablet TAKE ONE TABLET BY MOUTH AT BEDTIME AS NEEDED FOR muscle SPASMS 90 tablet 0   No current facility-administered medications on file prior to visit.    Allergies  Allergen Reactions   Prevpac [Amoxicill-Clarithro-Lansopraz] Other (See Comments)    Throat closed up   Latex Rash   Duloxetine Other (See Comments)    Nausea, diarrhea and jittery   Tramadol     GI symptoms    Past Medical History:  Diagnosis Date   Allergic rhinitis, cause unspecified    Allergy    Anemia    Complication of anesthesia    Diverticulosis    DVT (deep venous thrombosis) (HCC)    Gastric ulcer    in past   GERD (gastroesophageal reflux  disease)    Hemorrhoids, internal    Hyperlipidemia    Osteoarthrosis involving, or with mention of more than one site, but not specified as generalized, multiple sites    PONV (postoperative nausea and vomiting)    Ulcer (traumatic) of oral mucosa    Unspecified venous (peripheral) insufficiency     Past Surgical History:  Procedure Laterality Date   ABDOMINAL HYSTERECTOMY  1996   BREAST BIOPSY  2003   CHOLECYSTECTOMY  2000   COLONOSCOPY WITH PROPOFOL N/A 10/02/2015   Procedure: COLONOSCOPY WITH PROPOFOL;  Surgeon: Lollie Sails, MD;  Location: [facility] ENDOSCOPY;  Service: Endoscopy;   Laterality: N/A;   COLONOSCOPY WITH PROPOFOL N/A 06/17/2018   Procedure: COLONOSCOPY WITH PROPOFOL;  Surgeon: Toledo, Benay Pike, MD;  Location: [facility] ENDOSCOPY;  Service: Gastroenterology;  Laterality: N/A;   DEXA  5/14   Normal   ESOPHAGOGASTRODUODENOSCOPY (EGD) WITH PROPOFOL N/A 06/17/2018   Procedure: ESOPHAGOGASTRODUODENOSCOPY (EGD) WITH PROPOFOL;  Surgeon: Toledo, Benay Pike, MD;  Location: [facility] ENDOSCOPY;  Service: Gastroenterology;  Laterality: N/A;   KNEE ARTHROSCOPY     right in 2007, left in 2011   PERIPHERAL VASCULAR THROMBECTOMY Left 08/27/2020   Procedure: PERIPHERAL VASCULAR THROMBECTOMY;  Surgeon: Algernon Huxley, MD;  Location: [place] CV LAB;  Service: Cardiovascular;  Laterality: Left;   TONSILLECTOMY AND ADENOIDECTOMY  childhood    Family History  Problem Relation Age of Onset   Heart disease Mother    Heart disease Father    Diabetes Son 5       Type 1   Hypertension Son    Stroke Brother    Diabetes Other 2       type 1   Hyperlipidemia Neg Hx    Depression Neg Hx     Social History   Socioeconomic History   Marital status: Widowed    Spouse name: Not on file   Number of children: 1   Years of education: Not on file   Highest education level: Not on file  Occupational History   Occupation: Copywriter, advertising    Comment: retired  Tobacco Use   Smoking status: Former    Types: Cigarettes   Smokeless tobacco: Never  Vaping Use   Vaping Use: Never used  Substance and Sexual Activity   Alcohol use: No    Comment: rare glass of wine   Drug use: No   Sexual activity: Not on file  Other Topics Concern   Not on file  Social History Narrative   Husband died 2023/03/26   Has living will   Asks for [name] to make decisions   Would accept resuscitation--but no prolonged ventilation   Doesn't want tube feeds if cognitively unaware   Social Determinants of Health   Financial Resource Strain: Low Risk    Difficulty of Paying Living Expenses: Not very hard   Food Insecurity: Not on file  Transportation Needs: Not on file  Physical Activity: Not on file  Stress: Not on file  Social Connections: Not on file  Intimate Partner Violence: Not on file   Review of Systems Sleeping well lately--hasn't needed the lorazepam Wears seat belt Teeth okay No suspicious skin lesions--had bad derm visit recently (won't go back) Bowels are fine---no blood Voids okay--but starting to have urge incontinent No chest pain or SOB---but does get easy DOE (poor fitness) No dizziness or syncope Chronic edema--mostly on left    Objective:   Physical Exam Constitutional:      Appearance: Normal appearance.  HENT:     Mouth/Throat:     Comments: No lesions Eyes:     Conjunctiva/sclera: Conjunctivae normal.     Pupils: Pupils are equal, round, and reactive to light.  Cardiovascular:     Rate and Rhythm: Normal rate and regular rhythm.     Pulses: Normal pulses.     Heart sounds: No murmur heard.   No gallop.  Pulmonary:     Effort: Pulmonary effort is normal.     Breath sounds: Normal breath sounds. No wheezing or rales.  Abdominal:     Palpations: Abdomen is soft.     Tenderness: There is no abdominal tenderness.  Musculoskeletal:     Cervical back: Neck supple.     Comments: Thick calves without pitting or tenderness  Lymphadenopathy:     Cervical: No cervical adenopathy.  Skin:    Findings: No lesion or rash.  Neurological:     General: No focal deficit present.     Mental Status: She is alert and oriented to person, place, and time.     Comments: Mini-cog normal  Psychiatric:        Mood and Affect: Mood normal.        Behavior: Behavior normal.           Assessment & Plan:

## 2022-01-02 NOTE — Assessment & Plan Note (Signed)
Plans to try Herbalife Discussed slow increase in exercise BMI 40

## 2022-01-02 NOTE — Assessment & Plan Note (Signed)
Some anxiety in past---has lorazepam for rare use Grieving/dysthymia---meds not indicated

## 2022-01-02 NOTE — Assessment & Plan Note (Signed)
Chronic pain Uses lodine 500 bid Uses heat, etc

## 2022-01-02 NOTE — Assessment & Plan Note (Signed)
Will continue on eliquis 5 bid  due to recurrent DVT/PE

## 2022-01-06 ENCOUNTER — Inpatient Hospital Stay: Payer: PPO | Attending: Oncology

## 2022-01-06 ENCOUNTER — Inpatient Hospital Stay: Payer: PPO

## 2022-01-06 ENCOUNTER — Inpatient Hospital Stay: Payer: PPO | Admitting: Nurse Practitioner

## 2022-01-06 ENCOUNTER — Other Ambulatory Visit: Payer: Self-pay

## 2022-01-06 VITALS — BP 159/97 | HR 86 | Temp 98.2°F | Resp 16 | Wt 245.5 lb

## 2022-01-06 DIAGNOSIS — D509 Iron deficiency anemia, unspecified: Secondary | ICD-10-CM | POA: Diagnosis not present

## 2022-01-06 DIAGNOSIS — Z862 Personal history of diseases of the blood and blood-forming organs and certain disorders involving the immune mechanism: Secondary | ICD-10-CM | POA: Insufficient documentation

## 2022-01-06 LAB — CBC WITH DIFFERENTIAL/PLATELET
Abs Immature Granulocytes: 0.04 10*3/uL (ref 0.00–0.07)
Basophils Absolute: 0 10*3/uL (ref 0.0–0.1)
Basophils Relative: 1 %
Eosinophils Absolute: 0.2 10*3/uL (ref 0.0–0.5)
Eosinophils Relative: 4 %
HCT: 41.2 % (ref 36.0–46.0)
Hemoglobin: 13.1 g/dL (ref 12.0–15.0)
Immature Granulocytes: 1 %
Lymphocytes Relative: 28 %
Lymphs Abs: 1.5 10*3/uL (ref 0.7–4.0)
MCH: 28.5 pg (ref 26.0–34.0)
MCHC: 31.8 g/dL (ref 30.0–36.0)
MCV: 89.6 fL (ref 80.0–100.0)
Monocytes Absolute: 0.6 10*3/uL (ref 0.1–1.0)
Monocytes Relative: 11 %
Neutro Abs: 2.9 10*3/uL (ref 1.7–7.7)
Neutrophils Relative %: 55 %
Platelets: 244 10*3/uL (ref 150–400)
RBC: 4.6 MIL/uL (ref 3.87–5.11)
RDW: 13.1 % (ref 11.5–15.5)
WBC: 5.3 10*3/uL (ref 4.0–10.5)
nRBC: 0 % (ref 0.0–0.2)

## 2022-01-06 LAB — IRON AND TIBC
Iron: 54 ug/dL (ref 28–170)
Saturation Ratios: 13 % (ref 10.4–31.8)
TIBC: 407 ug/dL (ref 250–450)
UIBC: 353 ug/dL

## 2022-01-06 LAB — VITAMIN B12: Vitamin B-12: 213 pg/mL (ref 180–914)

## 2022-01-06 LAB — FERRITIN: Ferritin: 11 ng/mL (ref 11–307)

## 2022-01-06 NOTE — Progress Notes (Signed)
[place]  [phone] [phone]  ID: Aralyn C Utt OB: 12-27-1945  MR#: [id number]  WUG#:[id number]  Patient Care Team: [name], MD as PCP - General (Pediatrics) Lloyd Huger, MD as Consulting Physician (Hematology and Oncology) [name], [facility] as Pharmacist (Pharmacist)  CHIEF COMPLAINT: Iron deficiency anemia.  INTERVAL HISTORY: Patient returns to clinic today for repeat laboratory work, further evaluation, and consideration of additional IV iron.  She complains of weakness, lethargy, fatigue. Her husband recently passed away. No neurologic complaint.  No fevers or illness.  Appetite is stable and she denies weight loss.  No nausea, vomiting, constipation, diarrhea.  No melena or hematochezia.  No urinary complaints.  No further specific complaints today.  REVIEW OF SYSTEMS:   Review of Systems  Constitutional:  Positive for malaise/fatigue. Negative for fever and weight loss.  Respiratory: Negative.  Negative for cough, hemoptysis and shortness of breath.   Cardiovascular: Negative.  Negative for chest pain and leg swelling.  Gastrointestinal: Negative.  Negative for abdominal pain, blood in stool and melena.  Genitourinary: Negative.  Negative for hematuria.  Musculoskeletal: Negative.  Negative for back pain.  Skin: Negative.  Negative for rash.  Neurological: Negative.  Negative for sensory change, focal weakness, weakness and headaches.  Psychiatric/Behavioral: Negative.  The patient is not nervous/anxious.    As per HPI. Otherwise, a complete review of systems is negative.  PAST MEDICAL HISTORY: Past Medical History:  Diagnosis Date   Allergic rhinitis, cause unspecified    Allergy    Anemia    Complication of anesthesia    Diverticulosis    DVT (deep venous thrombosis) ([place])    Gastric ulcer    in past   GERD (gastroesophageal reflux disease)    Hemorrhoids, internal    Hyperlipidemia     Osteoarthrosis involving, or with mention of more than one site, but not specified as generalized, multiple sites    PONV (postoperative nausea and vomiting)    Ulcer (traumatic) of oral mucosa    Unspecified venous (peripheral) insufficiency     PAST SURGICAL HISTORY: Past Surgical History:  Procedure Laterality Date   ABDOMINAL HYSTERECTOMY  1996   BREAST BIOPSY  2003   CHOLECYSTECTOMY  2000   COLONOSCOPY WITH PROPOFOL N/A 10/02/2015   Procedure: COLONOSCOPY WITH PROPOFOL;  Surgeon: Lollie Sails, MD;  Location: [facility] ENDOSCOPY;  Service: Endoscopy;  Laterality: N/A;   COLONOSCOPY WITH PROPOFOL N/A 06/17/2018   Procedure: COLONOSCOPY WITH PROPOFOL;  Surgeon: Toledo, Benay Pike, MD;  Location: [facility] ENDOSCOPY;  Service: Gastroenterology;  Laterality: N/A;   DEXA  5/14   Normal   ESOPHAGOGASTRODUODENOSCOPY (EGD) WITH PROPOFOL N/A 06/17/2018   Procedure: ESOPHAGOGASTRODUODENOSCOPY (EGD) WITH PROPOFOL;  Surgeon: Toledo, Benay Pike, MD;  Location: [facility] ENDOSCOPY;  Service: Gastroenterology;  Laterality: N/A;   KNEE ARTHROSCOPY     right in 2007, left in 2011   PERIPHERAL VASCULAR THROMBECTOMY Left 08/27/2020   Procedure: PERIPHERAL VASCULAR THROMBECTOMY;  Surgeon: Algernon Huxley, MD;  Location: [place] CV LAB;  Service: Cardiovascular;  Laterality: Left;   TONSILLECTOMY AND ADENOIDECTOMY  childhood    FAMILY HISTORY: Family History  Problem Relation Age of Onset   Heart disease Mother    Heart disease Father    Diabetes Son 5       Type 1   Hypertension Son    Stroke Brother    Diabetes Other 2       type 1   Hyperlipidemia Neg Hx  Depression Neg Hx     ADVANCED DIRECTIVES (Y/N):  N  HEALTH MAINTENANCE: Social History   Tobacco Use   Smoking status: Former    Types: Cigarettes   Smokeless tobacco: Never  Vaping Use   Vaping Use: Never used  Substance Use Topics   Alcohol use: No    Comment: rare glass of wine   Drug use: No     Colonoscopy:  PAP:  Bone  density:  Lipid panel:  Allergies  Allergen Reactions   Prevpac [Amoxicill-Clarithro-Lansopraz] Other (See Comments)    Throat closed up   Latex Rash   Duloxetine Other (See Comments)    Nausea, diarrhea and jittery   Tramadol     GI symptoms    Current Outpatient Medications  Medication Sig Dispense Refill   acetaminophen (TYLENOL) 500 MG tablet Take 1,000 mg by mouth every 8 (eight) hours as needed.      albuterol (PROAIR HFA) 108 (90 Base) MCG/ACT inhaler INHALE 2 PUFFS INTO THE LUNGS EVERY 6 (SIX) HOURS AS NEEDED FOR WHEEZING. (Patient taking differently: Inhale 2 puffs into the lungs every 6 (six) hours as needed for wheezing.) 8.5 each 1   apixaban (ELIQUIS) 5 MG TABS tablet Take 1 tablet (5 mg total) by mouth 2 (two) times daily. 180 tablet 2   cetirizine (ZYRTEC) 10 MG tablet Take 10 mg by mouth at bedtime.      cholecalciferol (VITAMIN D) 1000 units tablet Take 1,000 Units by mouth daily.     docusate sodium (COLACE) 100 MG capsule Take 100 mg by mouth daily as needed.      etodolac (LODINE) 500 MG tablet TAKE ONE TABLET BY MOUTH TWICE DAILY AS NEEDED 180 tablet 0   Glucos-Chondroit-Hyaluron-MSM (GLUCOSAMINE CHONDROITIN JOINT) TABS Take 2 tablets by mouth daily.     LORazepam (ATIVAN) 0.5 MG tablet TAKE 1/2 TO 1 TABLET BY MOUTH EVERYDAY AT BEDTIME 30 tablet 0   Menthol-Methyl Salicylate (SALONPAS PAIN RELIEF PATCH EX) Apply topically. prn     niacin 500 MG tablet Take 500 mg by mouth daily.     nortriptyline (PAMELOR) 10 MG capsule Take 2 capsules by mouth at bedtime.     pantoprazole (PROTONIX) 40 MG tablet TAKE ONE TABLET BY MOUTH TWICE DAILY. NEEDS OFFICE VISIT FOR FURTHER REFILLS 180 tablet 0   polyethylene glycol (MIRALAX / GLYCOLAX) packet Take 17 g by mouth daily as needed for moderate constipation.      tiZANidine (ZANAFLEX) 2 MG tablet TAKE ONE TABLET BY MOUTH AT BEDTIME AS NEEDED FOR muscle SPASMS 90 tablet 0   No current facility-administered medications for this  visit.    OBJECTIVE: Vitals:   01/06/22 1258  BP: (!) 159/97  Pulse: 86  Resp: 16  Temp: 98.2 F (36.8 C)  SpO2: 99%     Body mass index is 40.85 kg/m.    ECOG FS:0 - Asymptomatic  General: Well-developed, well-nourished, no acute distress. Eyes: Pink conjunctiva, anicteric sclera. Lungs: Clear to auscultation bilaterally.  No audible wheezing or coughing Heart: Regular rate and rhythm.  Abdomen: Soft, nontender, nondistended.  Musculoskeletal: No edema, cyanosis, or clubbing. Neuro: Alert, answering all questions appropriately. Cranial nerves grossly intact. Skin: No rashes or petechiae noted. Psych: Normal affect.  LAB RESULTS:  Lab Results  Component Value Date   NA 140 08/18/2020   K 4.3 08/18/2020   CL 107 08/18/2020   CO2 25 08/18/2020   GLUCOSE 96 08/18/2020   BUN 14 08/27/2020   CREATININE 0.86 08/27/2020  CALCIUM 9.4 08/18/2020   PROT 7.0 08/02/2020   ALBUMIN 4.0 08/02/2020   AST 15 08/02/2020   ALT 13 08/02/2020   ALKPHOS 131 (H) 08/02/2020   BILITOT 0.8 08/02/2020   GFRNONAA >60 08/27/2020   GFRAA >60 08/27/2020    Lab Results  Component Value Date   WBC 5.3 01/06/2022   NEUTROABS 2.9 01/06/2022   HGB 13.1 01/06/2022   HCT 41.2 01/06/2022   MCV 89.6 01/06/2022   PLT 244 01/06/2022   Lab Results  Component Value Date   IRON 110 09/03/2021   TIBC 419 09/03/2021   IRONPCTSAT 26 09/03/2021   Lab Results  Component Value Date   FERRITIN 13 09/03/2021     STUDIES: No results found.  ASSESSMENT: Iron deficiency anemia.  PLAN:    1.  Iron deficiency anemia: Etiology-likely related to chronic gastritis based on EGD and colonoscopy from June 17, 2018.  No obvious bleeding at that time.  Her last dose of IV Feraheme was on December 24, 2020.  Her hemoglobin continues to remain within normal limits with no evidence of microcytosis.  Ferritin and iron stores are pending at time of visit.  Due to insurance purposes, she will switch to IV Venofer  if needed.  IV iron at baseline.  2.  B12 deficiency: Resolved.  Patient last received B12 injection on February 28, 2021.    3.  Thrombocytopenia: Resolved.    4.  Family history of multiple myeloma: Brother has multiple myeloma.  Currently, there is nothing in her laboratory work that suggests she has myeloma.  Can consider SPEP in the future, but this is not necessary at this point.    Disposition: 4 months-labs (CBC, ferritin, B12, iron and TIBC) states Day to week later follow-up with Dr. Grayland Ormond for possible Venofer  Patient expressed understanding and was in agreement with this plan. She also understands that She can call clinic at any time with any questions, concerns, or complaints.    Verlon Au, NP   01/06/2022

## 2022-01-06 NOTE — Progress Notes (Signed)
Pt c/o "lethargy, fatigue and generalized weakness."

## 2022-01-13 ENCOUNTER — Other Ambulatory Visit: Payer: Self-pay | Admitting: Internal Medicine

## 2022-01-13 NOTE — Telephone Encounter (Signed)
Last filled 11-13-21 #30 Last OV 01-02-22 Next OV 01-06-23 [facility]

## 2022-02-03 ENCOUNTER — Telehealth: Payer: Self-pay

## 2022-02-03 NOTE — Progress Notes (Signed)
Error

## 2022-02-12 ENCOUNTER — Other Ambulatory Visit: Payer: Self-pay | Admitting: Internal Medicine

## 2022-02-13 ENCOUNTER — Telehealth: Payer: Self-pay

## 2022-02-13 NOTE — Progress Notes (Signed)
? ? ?Chronic Care Management ?Pharmacy Assistant  ? ?Name: Alice Donaldson  MRN: [id number] DOB: [birth year] ? ?Reason for Encounter: CCM (Medication Adherence and Delivery Coordination) ?  ?Recent office visits:  ?01/02/2022 - Viviana Simpler, MD - Patient presented for [facility]. No medication changes.  ? ?Recent consult visits:  ?01/06/2022 - Beckey Rutter, NP - Oncology - Patient presented for iron deficiency anemia. No medication changes.  ? ?Hospital visits:  ?None in previous 6 months ? ?Medications: ?Outpatient Encounter Medications as of 02/13/2022  ?Medication Sig  ? acetaminophen (TYLENOL) 500 MG tablet Take 1,000 mg by mouth every 8 (eight) hours as needed.   ? albuterol (PROAIR HFA) 108 (90 Base) MCG/ACT inhaler INHALE 2 PUFFS INTO THE LUNGS EVERY 6 (SIX) HOURS AS NEEDED FOR WHEEZING. (Patient taking differently: Inhale 2 puffs into the lungs every 6 (six) hours as needed for wheezing.)  ? apixaban (ELIQUIS) 5 MG TABS tablet Take 1 tablet (5 mg total) by mouth 2 (two) times daily.  ? cetirizine (ZYRTEC) 10 MG tablet Take 10 mg by mouth at bedtime.   ? cholecalciferol (VITAMIN D) 1000 units tablet Take 1,000 Units by mouth daily.  ? docusate sodium (COLACE) 100 MG capsule Take 100 mg by mouth daily as needed.   ? etodolac (LODINE) 500 MG tablet TAKE ONE TABLET BY MOUTH TWICE DAILY AS NEEDED  ? Glucos-Chondroit-Hyaluron-MSM (GLUCOSAMINE CHONDROITIN JOINT) TABS Take 2 tablets by mouth daily.  ? LORazepam (ATIVAN) 0.5 MG tablet TAKE 1/2 TO 1 TABLET BY MOUTH daily AT bedtime  ? Menthol-Methyl Salicylate (SALONPAS PAIN RELIEF PATCH EX) Apply topically. prn  ? niacin 500 MG tablet Take 500 mg by mouth daily.  ? nortriptyline (PAMELOR) 10 MG capsule Take 2 capsules by mouth at bedtime.  ? pantoprazole (PROTONIX) 40 MG tablet TAKE ONE TABLET BY MOUTH TWICE DAILY. Needs appointment for further refills  ? polyethylene glycol (MIRALAX / GLYCOLAX) packet Take 17 g by mouth daily as needed for moderate constipation.    ? tiZANidine (ZANAFLEX) 2 MG tablet TAKE ONE TABLET BY MOUTH EVERYDAY AT BEDTIME AS NEEDED FOR muscle SPASMS  ? ?No facility-administered encounter medications on file as of 02/13/2022.  ? ?BP Readings from Last 3 Encounters:  ?01/06/22 (!) 159/97  ?01/02/22 130/80  ?09/03/21 (!) 155/87  ?  ?Lab Results  ?Component Value Date  ? HGBA1C 6.1 04/18/2015  ?  ?Recent OV, Consult or Hospital visit:  ?Recent medication changes indicated:  ? ?Last adherence delivery date: 11/26/2021     ? ?Patient is due for next adherence delivery on: 02/25/2022; patient called [facility] and needs her medication delivered on 02/19/2022 due to being low on Eliquis.  ? ?Spoke with patient on 02/13/2022 reviewed medications and coordinated delivery. ? ?This delivery to include: Vials  90 Days  ?Packs: No Packs ?  ?VIAL medications:   ?Eliquis 5 mg - 1 tablet twice daily (1 morning and 1 evening) - 16 tablets remaining ?Etodolac 500 mg- 1 tablet twice daily (1 morning and 1 evening) - 50 tablets remaining ?Lorazepam 0.5 mg - 0.5 - 1 tablet daily (bedtime) -  2 1/2 bottles remaining ?  ?Patient declined the following medications this month: ?Tizanidine 2 mg- 1 capsule 3 times daily (PRN - only 1 at bedtime) - 1 full bottle remaining ?Pantoprazole 40 mg - 1 tab every morning and evening -  3 bottles remaining ?  ?Will continue to buy OTC ?Biotin 5000 mcg- 1 tablet daily  ?Vitamin D 1000 Units ?Glucosamine  Chondroitin Joint tablets ?Cetirizine 10mg  ?Colace 100 mg - No longer taking ?Niacin 500 mg ? ?Any concerns about your medications? No ? ?How often do you forget or accidentally miss a dose? Never ? ?Do you use a pillbox? Yes ? ?Is patient in packaging No ?  ?Refills requested from providers include: ?Eliquis 5 mg - 1 tablet twice daily (1 morning and 1 evening) ?Etodolac 500 mg- 1 tablet twice daily (1 morning and 1 evening) ?Tizanidine 2 mg- 1 capsule 3 times daily (PRN - only 1 at bedtime) ?Pantoprazole 40 mg - 1 tab every morning  and evening ? ?Confirmed delivery date of 02/19/2022, advised patient that pharmacy will contact them the morning of delivery.   ?  ?Recent blood pressure readings are as follows: Patient does not have a blood pressure cuff at home. ? ?Annual wellness visit in last year? Yes 01/02/2022 ?Most Recent BP reading: 159/97 on 01/06/2022 ? ?Charlene Brooke, CPP notified ? ?[name], RMA ?Clinical Pharmacy Assistant ?[phone] ? ?Time Spent: 40 Minutes ? ?

## 2022-02-17 ENCOUNTER — Telehealth (INDEPENDENT_AMBULATORY_CARE_PROVIDER_SITE_OTHER): Payer: Self-pay

## 2022-02-17 NOTE — Telephone Encounter (Signed)
Chasity from [facility] left a voicemail checking on the status of rx fax over for Eliquis 90 supply. I informed Ally per Eulogio Ditch NP that the patient has been receiving refills through case management with [facility] and we have not seen the patient in over a year. Pharmacy was made aware to contact patient PCP for refill request. ?

## 2022-02-18 ENCOUNTER — Other Ambulatory Visit: Payer: Self-pay | Admitting: Family

## 2022-02-18 MED ORDER — APIXABAN 5 MG PO TABS
5.0000 mg | ORAL_TABLET | Freq: Two times a day (BID) | ORAL | 2 refills | Status: DC
Start: 2022-02-18 — End: 2022-02-18

## 2022-02-18 MED ORDER — APIXABAN 5 MG PO TABS: 5.0000 mg | ORAL_TABLET | Freq: Two times a day (BID) | ORAL | 2 refills | Status: DC

## 2022-02-21 DIAGNOSIS — H43813 Vitreous degeneration, bilateral: Secondary | ICD-10-CM | POA: Diagnosis not present

## 2022-03-06 DIAGNOSIS — H2511 Age-related nuclear cataract, right eye: Secondary | ICD-10-CM | POA: Diagnosis not present

## 2022-03-09 ENCOUNTER — Other Ambulatory Visit: Payer: Self-pay | Admitting: Internal Medicine

## 2022-03-10 NOTE — Telephone Encounter (Signed)
Last filled 01-13-22 #30 ?Last OV 01-02-22 ?Next OV 01-06-23 [facility] ?

## 2022-03-18 ENCOUNTER — Encounter: Payer: Self-pay | Admitting: Ophthalmology

## 2022-03-24 NOTE — Discharge Instructions (Signed)

## 2022-03-25 ENCOUNTER — Encounter
Admission: RE | Disposition: A | Discharge status: Home / Self Care | Payer: Self-pay | Source: Home / Self Care | Attending: Ophthalmology

## 2022-03-25 ENCOUNTER — Ambulatory Visit
Admission: RE | Admit: 2022-03-25 | Discharge: 2022-03-25 | Disposition: A | Discharge status: Home / Self Care | Payer: PPO | Attending: Ophthalmology | Admitting: Ophthalmology

## 2022-03-25 ENCOUNTER — Ambulatory Visit: Payer: PPO | Admitting: Anesthesiology

## 2022-03-25 ENCOUNTER — Other Ambulatory Visit: Payer: Self-pay

## 2022-03-25 ENCOUNTER — Encounter: Payer: Self-pay | Admitting: Ophthalmology

## 2022-03-25 DIAGNOSIS — H25041 Posterior subcapsular polar age-related cataract, right eye: Secondary | ICD-10-CM | POA: Diagnosis not present

## 2022-03-25 DIAGNOSIS — Z87891 Personal history of nicotine dependence: Secondary | ICD-10-CM | POA: Diagnosis not present

## 2022-03-25 DIAGNOSIS — Z86718 Personal history of other venous thrombosis and embolism: Secondary | ICD-10-CM | POA: Insufficient documentation

## 2022-03-25 DIAGNOSIS — Z6838 Body mass index (BMI) 38.0-38.9, adult: Secondary | ICD-10-CM | POA: Diagnosis not present

## 2022-03-25 DIAGNOSIS — E663 Overweight: Secondary | ICD-10-CM | POA: Diagnosis not present

## 2022-03-25 DIAGNOSIS — H25811 Combined forms of age-related cataract, right eye: Secondary | ICD-10-CM | POA: Diagnosis not present

## 2022-03-25 DIAGNOSIS — K219 Gastro-esophageal reflux disease without esophagitis: Secondary | ICD-10-CM | POA: Insufficient documentation

## 2022-03-25 DIAGNOSIS — H2511 Age-related nuclear cataract, right eye: Secondary | ICD-10-CM | POA: Diagnosis not present

## 2022-03-25 DIAGNOSIS — Z7901 Long term (current) use of anticoagulants: Secondary | ICD-10-CM | POA: Insufficient documentation

## 2022-03-25 HISTORY — DX: Dizziness and giddiness: R42

## 2022-03-25 HISTORY — DX: Motion sickness, initial encounter: T75.3XXA

## 2022-03-25 HISTORY — PX: CATARACT EXTRACTION W/PHACO: SHX586

## 2022-03-25 SURGERY — PHACOEMULSIFICATION, CATARACT, WITH IOL INSERTION
Anesthesia: Monitor Anesthesia Care | Site: Eye | Laterality: Right

## 2022-03-25 MED ORDER — MIDAZOLAM HCL 2 MG/2ML IJ SOLN
INTRAMUSCULAR | Status: DC | PRN
  Administered 2022-03-25: 1 mg via INTRAVENOUS

## 2022-03-25 MED ORDER — MOXIFLOXACIN HCL 0.5 % OP SOLN
OPHTHALMIC | Status: DC | PRN
  Administered 2022-03-25: 0.2 mL via OPHTHALMIC

## 2022-03-25 MED ORDER — SIGHTPATH DOSE#1 NA CHONDROIT SULF-NA HYALURON 40-17 MG/ML IO SOLN
INTRAOCULAR | Status: DC | PRN
  Administered 2022-03-25: 1 mL via INTRAOCULAR

## 2022-03-25 MED ORDER — SIGHTPATH DOSE#1 BSS IO SOLN
INTRAOCULAR | Status: DC | PRN
  Administered 2022-03-25: 15 mL

## 2022-03-25 MED ORDER — SIGHTPATH DOSE#1 BSS IO SOLN
INTRAOCULAR | Status: DC | PRN
  Administered 2022-03-25: 109 mL via OPHTHALMIC

## 2022-03-25 MED ORDER — ARMC OPHTHALMIC DILATING DROPS
1.0000 "application " | OPHTHALMIC | Status: DC | PRN
  Administered 2022-03-25 (×3): 1 via OPHTHALMIC

## 2022-03-25 MED ORDER — BRIMONIDINE TARTRATE-TIMOLOL 0.2-0.5 % OP SOLN
OPHTHALMIC | Status: DC | PRN
  Administered 2022-03-25: 1 [drp] via OPHTHALMIC

## 2022-03-25 MED ORDER — SIGHTPATH DOSE#1 BSS IO SOLN
INTRAOCULAR | Status: DC | PRN
  Administered 2022-03-25: 1 mL via INTRAMUSCULAR

## 2022-03-25 MED ORDER — TETRACAINE HCL 0.5 % OP SOLN
1.0000 [drp] | OPHTHALMIC | Status: DC | PRN
  Administered 2022-03-25 (×3): 1 [drp] via OPHTHALMIC

## 2022-03-25 MED ORDER — FENTANYL CITRATE (PF) 100 MCG/2ML IJ SOLN
INTRAMUSCULAR | Status: DC | PRN
  Administered 2022-03-25: 50 ug via INTRAVENOUS

## 2022-03-25 MED ORDER — LACTATED RINGERS IV SOLN: INTRAVENOUS | Status: DC

## 2022-03-25 SURGICAL SUPPLY — 12 items
CATARACT SUITE SIGHTPATH (MISCELLANEOUS) ×2 IMPLANT
FEE CATARACT SUITE SIGHTPATH (MISCELLANEOUS) ×1 IMPLANT
GLOVE SURG ENC TEXT LTX SZ8 (GLOVE) ×2 IMPLANT
GLOVE SURG TRIUMPH 8.0 PF LTX (GLOVE) ×2 IMPLANT
LENS IOL ACRSF IQ VT 15 20.5 IMPLANT
LENS IOL ACRYSOF VIVITY 20.5 ×2 IMPLANT
LENS IOL VIVITY 015 20.5 ×1 IMPLANT
NDL FILTER BLUNT 18X1 1/2 (NEEDLE) ×1 IMPLANT
NEEDLE FILTER BLUNT 18X 1/2SAF (NEEDLE) ×1
NEEDLE FILTER BLUNT 18X1 1/2 (NEEDLE) ×1 IMPLANT
SYR 3ML LL SCALE MARK (SYRINGE) ×2 IMPLANT
WATER STERILE IRR 250ML POUR (IV SOLUTION) ×2 IMPLANT

## 2022-03-25 NOTE — Anesthesia Postprocedure Evaluation (Signed)
Anesthesia Post Note ? ?Patient: Alice Donaldson ? ?Procedure(s) Performed: CATARACT EXTRACTION PHACO AND INTRAOCULAR LENS PLACEMENT (IOC) RIGHT  VIVITY LENS 22.90 01:57.1 (Right: Eye) ? ? ?  ?Patient location during evaluation: PACU ?Anesthesia Type: MAC ?Level of consciousness: awake and alert and oriented ?Pain management: satisfactory to patient ?Vital Signs Assessment: post-procedure vital signs reviewed and stable ?Respiratory status: spontaneous breathing, nonlabored ventilation and respiratory function stable ?Cardiovascular status: blood pressure returned to baseline and stable ?Postop Assessment: Adequate PO intake and No signs of nausea or vomiting ?Anesthetic complications: no ? ? ?No notable events documented. ? ?Raliegh Ip ? ? ? ? ? ?

## 2022-03-25 NOTE — H&P (Signed)
[place]  ? ?Primary Care Physician:  [name], MD ?Ophthalmologist: Dr. George Ina ? ?Pre-Procedure History & Physical: ?HPI:  Alice Donaldson is a 76 y.o. female here for cataract surgery. ?  ?Past Medical History:  ?Diagnosis Date  ? Allergic rhinitis, cause unspecified   ? Allergy   ? Anemia   ? Complication of anesthesia   ? Diverticulosis   ? DVT (deep venous thrombosis) ([place])   ? x2.  Last in 9/21  ? Gastric ulcer   ? in past  ? GERD (gastroesophageal reflux disease)   ? Hemorrhoids, internal   ? Hyperlipidemia   ? Motion sickness   ? ocean ships  ? Osteoarthrosis involving, or with mention of more than one site, but not specified as generalized, multiple sites   ? PONV (postoperative nausea and vomiting)   ? Ulcer (traumatic) of oral mucosa   ? Unspecified venous (peripheral) insufficiency   ? Vertigo   ? No episodes over 10 years  ? ? ?Past Surgical History:  ?Procedure Laterality Date  ? ABDOMINAL HYSTERECTOMY  1996  ? BREAST BIOPSY  2003  ? CHOLECYSTECTOMY  2000  ? COLONOSCOPY WITH PROPOFOL N/A 10/02/2015  ? Procedure: COLONOSCOPY WITH PROPOFOL;  Surgeon: Lollie Sails, MD;  Location: [facility] ENDOSCOPY;  Service: Endoscopy;  Laterality: N/A;  ? COLONOSCOPY WITH PROPOFOL N/A 06/17/2018  ? Procedure: COLONOSCOPY WITH PROPOFOL;  Surgeon: Toledo, Benay Pike, MD;  Location: [facility] ENDOSCOPY;  Service: Gastroenterology;  Laterality: N/A;  ? DEXA  5/14  ? Normal  ? ESOPHAGOGASTRODUODENOSCOPY (EGD) WITH PROPOFOL N/A 06/17/2018  ? Procedure: ESOPHAGOGASTRODUODENOSCOPY (EGD) WITH PROPOFOL;  Surgeon: Toledo, Benay Pike, MD;  Location: [facility] ENDOSCOPY;  Service: Gastroenterology;  Laterality: N/A;  ? KNEE ARTHROSCOPY    ? right in 2007, left in 2011  ? PERIPHERAL VASCULAR THROMBECTOMY Left 08/27/2020  ? Procedure: PERIPHERAL VASCULAR THROMBECTOMY;  Surgeon: Algernon Huxley, MD;  Location: [place] CV LAB;  Service: Cardiovascular;  Laterality: Left;  ? TONSILLECTOMY AND ADENOIDECTOMY  childhood  ? ? ?Prior  to Admission medications   ?Medication Sig Start Date End Date Taking? Authorizing Provider  ?acetaminophen (TYLENOL) 500 MG tablet Take 1,000 mg by mouth every 8 (eight) hours as needed.    Yes [provider]  ?albuterol (PROAIR HFA) 108 (90 Base) MCG/ACT inhaler INHALE 2 PUFFS INTO THE LUNGS EVERY 6 (SIX) HOURS AS NEEDED FOR WHEEZING. ?Patient taking differently: Inhale 2 puffs into the lungs every 6 (six) hours as needed for wheezing. 05/06/16  Yes [name], MD  ?apixaban (ELIQUIS) 5 MG TABS tablet Take 1 tablet (5 mg total) by mouth 2 (two) times daily. 02/18/22  Yes Kennyth Arnold, FNP  ?cetirizine (ZYRTEC) 10 MG tablet Take 10 mg by mouth at bedtime.    Yes [provider]  ?cholecalciferol (VITAMIN D) 1000 units tablet Take 1,000 Units by mouth daily.   Yes [provider]  ?docusate sodium (COLACE) 100 MG capsule Take 100 mg by mouth daily as needed.    Yes [provider]  ?etodolac (LODINE) 500 MG tablet TAKE ONE TABLET BY MOUTH TWICE DAILY AS NEEDED 02/13/22  Yes [name], MD  ?Glucos-Chondroit-Hyaluron-MSM (GLUCOSAMINE CHONDROITIN JOINT) TABS Take 2 tablets by mouth daily.   Yes [provider]  ?LORazepam (ATIVAN) 0.5 MG tablet TAKE 1/2 TO 1 TABLET BY MOUTH AT bedtime 03/10/22  Yes [name], MD  ?Menthol-Methyl Salicylate (SALONPAS PAIN RELIEF PATCH EX) Apply topically. prn   Yes  [provider]  ?niacin 500 MG tablet Take 500 mg by mouth daily.   Yes [provider]  ?nortriptyline (PAMELOR) 10 MG capsule Take 2 capsules by mouth at bedtime. 04/12/18  Yes [provider]  ?pantoprazole (PROTONIX) 40 MG tablet TAKE ONE TABLET BY MOUTH TWICE DAILY. Needs appointment for further refills 02/12/22  Yes Viviana Simpler I, MD  ?polyethylene glycol (MIRALAX / GLYCOLAX) packet Take 17 g by mouth daily as needed for moderate constipation.    Yes [provider]  ?tiZANidine (ZANAFLEX) 2 MG tablet TAKE ONE TABLET  BY MOUTH EVERYDAY AT BEDTIME AS NEEDED FOR muscle SPASMS 02/13/22   [name], MD  ? ? ?Allergies as of 02/25/2022 - Review Complete 01/06/2022  ?Allergen Reaction Noted  ? Prevpac [amoxicill-clarithro-lansopraz] Other (See Comments) 03/03/2016  ? Latex Rash 03/03/2016  ? Duloxetine Other (See Comments) 11/03/2016  ? Tramadol  06/21/2018  ? ? ?Family History  ?Problem Relation Age of Onset  ? Heart disease Mother   ? Heart disease Father   ? Diabetes Son 5  ?     Type 1  ? Hypertension Son   ? Stroke Brother   ? Diabetes Other 2  ?     type 1  ? Hyperlipidemia Neg Hx   ? Depression Neg Hx   ? ? ?Social History  ? ?Socioeconomic History  ? Marital status: Widowed  ?  Spouse name: Not on file  ? Number of children: 1  ? Years of education: Not on file  ? Highest education level: Not on file  ?Occupational History  ? Occupation: Copywriter, advertising  ?  Comment: retired  ?Tobacco Use  ? Smoking status: Former  ?  Types: Cigarettes  ?  Quit date: 80  ?  Years since quitting: 33.2  ? Smokeless tobacco: Never  ?Vaping Use  ? Vaping Use: Never used  ?Substance and Sexual Activity  ? Alcohol use: Yes  ?  Alcohol/week: 2.0 standard drinks  ?  Types: 2 Glasses of wine per week  ?  Comment: glass of wine 2x/wk  ? Drug use: No  ? Sexual activity: Not on file  ?Other Topics Concern  ? Not on file  ?Social History Narrative  ? Husband died 2023/03/09  ? Has living will  ? Asks for [name] to make decisions  ? Would accept resuscitation--but no prolonged ventilation  ? Doesn't want tube feeds if cognitively unaware  ? ?Social Determinants of Health  ? ?Financial Resource Strain: Low Risk   ? Difficulty of Paying Living Expenses: Not very hard  ?Food Insecurity: Not on file  ?Transportation Needs: Not on file  ?Physical Activity: Not on file  ?Stress: Not on file  ?Social Connections: Not on file  ?Intimate Partner Violence: Not on file  ? ? ?Review of Systems: ?See HPI, otherwise negative ROS ? ?Physical Exam: ?BP (!) 155/106    Pulse 68   Temp (!) 97.5 ?F (36.4 ?C) (Temporal)   Ht '5\' 5"'$  (1.651 m)   Wt 105.5 kg   LMP  (LMP Unknown)   SpO2 98%   BMI 38.71 kg/m?  ?General:   Alert, cooperative in NAD ?Head:  Normocephalic and atraumatic. ?Respiratory:  Normal work of breathing. ?Cardiovascular:  RRR ? ?Impression/Plan: ?Sola C Kydd is here for cataract surgery. ? ?Risks, benefits, limitations, and alternatives regarding cataract surgery have been reviewed with the patient.  Questions have been answered.  All parties agreeable. ? ? ?Birder Robson, MD  03/25/2022, 10:18 AM ? ? ?

## 2022-03-25 NOTE — Transfer of Care (Signed)
Immediate Anesthesia Transfer of Care Note ? ?Patient: Alice Donaldson ? ?Procedure(s) Performed: CATARACT EXTRACTION PHACO AND INTRAOCULAR LENS PLACEMENT (IOC) RIGHT  VIVITY LENS 22.90 01:57.1 (Right: Eye) ? ?Patient Location: PACU ? ?Anesthesia Type: MAC ? ?Level of Consciousness: awake, alert  and patient cooperative ? ?Airway and Oxygen Therapy: Patient Spontanous Breathing and Patient connected to supplemental oxygen ? ?Post-op Assessment: Post-op Vital signs reviewed, Patient's Cardiovascular Status Stable, Respiratory Function Stable, Patent Airway and No signs of Nausea or vomiting ? ?Post-op Vital Signs: Reviewed and stable ? ?Complications: No notable events documented. ? ?

## 2022-03-25 NOTE — Anesthesia Preprocedure Evaluation (Addendum)
Anesthesia Evaluation  ?Patient identified by MRN, date of birth, ID band ?Patient awake ? ? ? ?Reviewed: ?Allergy & Precautions, H&P , NPO status , Patient's Chart, lab work & pertinent test results ? ?History of Anesthesia Complications ?(+) PONV and history of anesthetic complications ? ?Airway ?Mallampati: II ? ?TM Distance: >3 FB ?Neck ROM: full ? ? ? Dental ?no notable dental hx. ? ?  ?Pulmonary ?former smoker,  ?  ?Pulmonary exam normal ?breath sounds clear to auscultation ? ? ? ? ? ? Cardiovascular ?+ DVT  ?Normal cardiovascular exam ?Rhythm:regular Rate:Normal ? ?Chronic anticoagulation ?  ?Neuro/Psych ?  ? GI/Hepatic ?GERD  ,  ?Endo/Other  ?Overweight ? ? Renal/GU ?  ? ?  ?Musculoskeletal ? ? Abdominal ?  ?Peds ? Hematology ?  ?Anesthesia Other Findings ? ? Reproductive/Obstetrics ? ?  ? ? ? ? ? ? ? ? ? ? ? ? ? ?  ?  ? ? ? ? ? ? ? ? ?Anesthesia Physical ?Anesthesia Plan ? ?ASA: 3 ? ?Anesthesia Plan: MAC  ? ?Post-op Pain Management: Minimal or no pain anticipated  ? ?Induction:  ? ?PONV Risk Score and Plan: 3 and Treatment may vary due to age or medical condition, TIVA, Midazolam and Ondansetron ? ?Airway Management Planned:  ? ?Additional Equipment:  ? ?Intra-op Plan:  ? ?Post-operative Plan:  ? ?Informed Consent: I have reviewed the patients History and Physical, chart, labs and discussed the procedure including the risks, benefits and alternatives for the proposed anesthesia with the patient or authorized representative who has indicated his/her understanding and acceptance.  ? ? ? ?Dental Advisory Given ? ?Plan Discussed with: CRNA ? ?Anesthesia Plan Comments:   ? ? ? ? ? ?Anesthesia Quick Evaluation ? ?

## 2022-03-25 NOTE — Op Note (Signed)
PREOPERATIVE DIAGNOSIS:  Nuclear sclerotic cataract of the right eye. ?  ?POSTOPERATIVE DIAGNOSIS:  H25.11  NUCLEAR SCLEROSIS CATARACT ?H25.41 POSTERIOR CATARACT ?  ?OPERATIVE PROCEDURE:ORPROCALL@ ?  ?SURGEON:  Birder Robson, MD. ?  ?ANESTHESIA: ? ?Anesthesiologist: Ronelle Nigh, MD ?CRNA: Silvana Newness, CRNA ? ?1.      Managed anesthesia care. ?2.      0.63m of Shugarcaine was instilled in the eye following the paracentesis. ?  ?COMPLICATIONS:  None. ?  ?TECHNIQUE:   Stop and chop ?  ?DESCRIPTION OF PROCEDURE:  The patient was examined and consented in the preoperative holding area where the aforementioned topical anesthesia was applied to the right eye and then brought back to the Operating Room where the right eye was prepped and draped in the usual sterile ophthalmic fashion and a lid speculum was placed. A paracentesis was created with the side port blade and the anterior chamber was filled with viscoelastic. A near clear corneal incision was performed with the steel keratome. A continuous curvilinear capsulorrhexis was performed with a cystotome followed by the capsulorrhexis forceps. Hydrodissection and hydrodelineation were carried out with BSS on a blunt cannula. The lens was removed in a stop and chop  technique and the remaining cortical material was removed with the irrigation-aspiration handpiece. The capsular bag was inflated with viscoelastic and the Alcon DXT015 lens was placed in the capsular bag without complication. The remaining viscoelastic was removed from the eye with the irrigation-aspiration handpiece. The wounds were hydrated. The anterior chamber was flushed with BSS and the eye was inflated to physiologic pressure. 0.136mof Vigamox was placed in the anterior chamber. The wounds were found to be water tight. The eye was dressed with Combigan. The patient was given protective glasses to wear throughout the day and a shield with which to sleep tonight. The patient was also given drops  with which to begin a drop regimen today and will follow-up with me in one day. ?Implant Name Type Inv. Item Serial No. Manufacturer Lot No. LRB No. Used Action  ?LENS IOL ACRYSOF VIVITY 20.5 - [id number] IOL ACRYSOF VIVITY 20.5 [id number]  Right 1 Implanted  ? ?Procedure(s) with comments: ?CATARACT EXTRACTION PHACO AND INTRAOCULAR LENS PLACEMENT (IOC) RIGHT  VIVITY LENS 22.90 01:57.1 (Right) - Latex ? ?Electronically signed: WiBirder Robson/10/2022 10:52 AM ? ?

## 2022-03-26 ENCOUNTER — Encounter: Payer: Self-pay | Admitting: Ophthalmology

## 2022-04-23 ENCOUNTER — Encounter: Payer: Self-pay | Admitting: Internal Medicine

## 2022-05-04 NOTE — Progress Notes (Signed)
[place]  [phone] [phone]) [phone]  ID: Alice Donaldson OB: December 27, 1945  MR#: [id number]  RFF#:[id number]  Patient Care Team: [name], MD as PCP - General (Pediatrics) Lloyd Huger, MD as Consulting Physician (Hematology and Oncology) [name], [facility] as Pharmacist (Pharmacist)  I connected with Raymon Mutton on 05/09/22 at  2:45 PM EDT by video enabled telemedicine visit and verified that I am speaking with the correct person using two identifiers.   I discussed the limitations, risks, security and privacy concerns of performing an evaluation and management service by telemedicine and the availability of in-person appointments. I also discussed with the patient that there may be a patient responsible charge related to this service. The patient expressed understanding and agreed to proceed.   Other persons participating in the visit and their role in the encounter: Patient, MD.  Patient's location: Home. Provider's location: Clinic.  CHIEF COMPLAINT: Iron deficiency anemia.  INTERVAL HISTORY: Patient agreed to video assisted telemedicine visit for further evaluation and discussion of her laboratory results.  She continues to feel well and remains asymptomatic.  She does not complain of any weakness or fatigue today.  She has no neurologic complaints.  She denies any recent fevers or illnesses. She has a good appetite and denies weight loss.  She denies any chest pain, shortness of breath, cough, or hemoptysis.  She denies any nausea, vomiting, constipation, or diarrhea. She denies any melena or hematochezia. She has no urinary complaints.  Patient offers no specific complaints today.  REVIEW OF SYSTEMS:   Review of Systems  Constitutional: Negative.  Negative for fever, malaise/fatigue and weight loss.  Respiratory: Negative.  Negative for cough, hemoptysis and shortness of breath.   Cardiovascular: Negative.  Negative for chest  pain and leg swelling.  Gastrointestinal: Negative.  Negative for abdominal pain, blood in stool and melena.  Genitourinary: Negative.  Negative for hematuria.  Musculoskeletal: Negative.  Negative for back pain.  Skin: Negative.  Negative for rash.  Neurological: Negative.  Negative for sensory change, focal weakness, weakness and headaches.  Psychiatric/Behavioral: Negative.  The patient is not nervous/anxious.    As per HPI. Otherwise, a complete review of systems is negative.  PAST MEDICAL HISTORY: Past Medical History:  Diagnosis Date   Allergic rhinitis, cause unspecified    Allergy    Anemia    Complication of anesthesia    Diverticulosis    DVT (deep venous thrombosis) ([place])    x2.  Last in 9/21   Gastric ulcer    in past   GERD (gastroesophageal reflux disease)    Hemorrhoids, internal    Hyperlipidemia    Motion sickness    ocean ships   Osteoarthrosis involving, or with mention of more than one site, but not specified as generalized, multiple sites    PONV (postoperative nausea and vomiting)    Ulcer (traumatic) of oral mucosa    Unspecified venous (peripheral) insufficiency    Vertigo    No episodes over 10 years    PAST SURGICAL HISTORY: Past Surgical History:  Procedure Laterality Date   ABDOMINAL HYSTERECTOMY  1996   BREAST BIOPSY  2003   CATARACT EXTRACTION W/PHACO Right 03/25/2022   Procedure: CATARACT EXTRACTION PHACO AND INTRAOCULAR LENS PLACEMENT ([place]) RIGHT  VIVITY LENS 22.90 01:57.1;  Surgeon: Birder Robson, MD;  Location: [place];  Service: Ophthalmology;  Laterality: Right;  Latex   CHOLECYSTECTOMY  2000   COLONOSCOPY WITH PROPOFOL N/A 10/02/2015   Procedure:  COLONOSCOPY WITH PROPOFOL;  Surgeon: Lollie Sails, MD;  Location: [facility] ENDOSCOPY;  Service: Endoscopy;  Laterality: N/A;   COLONOSCOPY WITH PROPOFOL N/A 06/17/2018   Procedure: COLONOSCOPY WITH PROPOFOL;  Surgeon: Toledo, Benay Pike, MD;  Location: [facility] ENDOSCOPY;  Service:  Gastroenterology;  Laterality: N/A;   DEXA  5/14   Normal   ESOPHAGOGASTRODUODENOSCOPY (EGD) WITH PROPOFOL N/A 06/17/2018   Procedure: ESOPHAGOGASTRODUODENOSCOPY (EGD) WITH PROPOFOL;  Surgeon: Toledo, Benay Pike, MD;  Location: [facility] ENDOSCOPY;  Service: Gastroenterology;  Laterality: N/A;   KNEE ARTHROSCOPY     right in 2007, left in 2011   PERIPHERAL VASCULAR THROMBECTOMY Left 08/27/2020   Procedure: PERIPHERAL VASCULAR THROMBECTOMY;  Surgeon: Algernon Huxley, MD;  Location: [place] CV LAB;  Service: Cardiovascular;  Laterality: Left;   TONSILLECTOMY AND ADENOIDECTOMY  childhood    FAMILY HISTORY: Family History  Problem Relation Age of Onset   Heart disease Mother    Heart disease Father    Diabetes Son 5       Type 1   Hypertension Son    Stroke Brother    Diabetes Other 2       type 1   Hyperlipidemia Neg Hx    Depression Neg Hx     ADVANCED DIRECTIVES (Y/N):  N  HEALTH MAINTENANCE: Social History   Tobacco Use   Smoking status: Former    Types: Cigarettes    Quit date: 1990    Years since quitting: 33.4   Smokeless tobacco: Never  Vaping Use   Vaping Use: Never used  Substance Use Topics   Alcohol use: Yes    Alcohol/week: 2.0 standard drinks    Types: 2 Glasses of wine per week    Comment: glass of wine 2x/wk   Drug use: No     Colonoscopy:  PAP:  Bone density:  Lipid panel:  Allergies  Allergen Reactions   Prevpac [Amoxicill-Clarithro-Lansopraz] Other (See Comments)    Throat closed up   Latex Rash    Had issues when wearing powdered gloves all day long while working.   Duloxetine Other (See Comments)    Nausea, diarrhea and jittery   Tramadol     GI symptoms    Current Outpatient Medications  Medication Sig Dispense Refill   acetaminophen (TYLENOL) 500 MG tablet Take 1,000 mg by mouth every 8 (eight) hours as needed.      albuterol (PROAIR HFA) 108 (90 Base) MCG/ACT inhaler INHALE 2 PUFFS INTO THE LUNGS EVERY 6 (SIX) HOURS AS NEEDED FOR  WHEEZING. (Patient taking differently: Inhale 2 puffs into the lungs every 6 (six) hours as needed for wheezing.) 8.5 each 1   apixaban (ELIQUIS) 5 MG TABS tablet Take 1 tablet (5 mg total) by mouth 2 (two) times daily. 180 tablet 2   cetirizine (ZYRTEC) 10 MG tablet Take 10 mg by mouth at bedtime.      cholecalciferol (VITAMIN D) 1000 units tablet Take 1,000 Units by mouth daily.     docusate sodium (COLACE) 100 MG capsule Take 100 mg by mouth daily as needed.      etodolac (LODINE) 500 MG tablet TAKE ONE TABLET BY MOUTH TWICE DAILY AS NEEDED 180 tablet 0   Glucos-Chondroit-Hyaluron-MSM (GLUCOSAMINE CHONDROITIN JOINT) TABS Take 2 tablets by mouth daily.     LORazepam (ATIVAN) 0.5 MG tablet TAKE 1/2 TO 1 TABLET BY MOUTH AT bedtime 30 tablet 0   Menthol-Methyl Salicylate (SALONPAS PAIN RELIEF PATCH EX) Apply topically. prn     niacin 500  MG tablet Take 500 mg by mouth daily.     nortriptyline (PAMELOR) 10 MG capsule Take 2 capsules by mouth at bedtime.     pantoprazole (PROTONIX) 40 MG tablet TAKE ONE TABLET BY MOUTH TWICE DAILY. Needs appointment for further refills 180 tablet 3   polyethylene glycol (MIRALAX / GLYCOLAX) packet Take 17 g by mouth daily as needed for moderate constipation.      tiZANidine (ZANAFLEX) 2 MG tablet TAKE ONE TABLET BY MOUTH EVERYDAY AT BEDTIME AS NEEDED FOR muscle SPASMS 90 tablet 0   No current facility-administered medications for this visit.    OBJECTIVE: There were no vitals filed for this visit.    There is no height or weight on file to calculate BMI.    ECOG FS:0 - Asymptomatic  General: Well-developed, well-nourished, no acute distress. HEENT: Normocephalic. Neuro: Alert, answering all questions appropriately. Cranial nerves grossly intact. Psych: Normal affect.  LAB RESULTS:  Lab Results  Component Value Date   NA 140 08/18/2020   K 4.3 08/18/2020   CL 107 08/18/2020   CO2 25 08/18/2020   GLUCOSE 96 08/18/2020   BUN 14 08/27/2020   CREATININE  0.86 08/27/2020   CALCIUM 9.4 08/18/2020   PROT 7.0 08/02/2020   ALBUMIN 4.0 08/02/2020   AST 15 08/02/2020   ALT 13 08/02/2020   ALKPHOS 131 (H) 08/02/2020   BILITOT 0.8 08/02/2020   GFRNONAA >60 08/27/2020   GFRAA >60 08/27/2020    Lab Results  Component Value Date   WBC 5.2 05/05/2022   NEUTROABS 2.6 05/05/2022   HGB 13.6 05/05/2022   HCT 42.5 05/05/2022   MCV 88.0 05/05/2022   PLT 221 05/05/2022   Lab Results  Component Value Date   IRON 82 05/05/2022   TIBC 416 05/05/2022   IRONPCTSAT 20 05/05/2022   Lab Results  Component Value Date   FERRITIN 13 05/05/2022     STUDIES: No results found.  ASSESSMENT: Iron deficiency anemia.  PLAN:    1.  Iron deficiency anemia: Patient's hemoglobin and iron stores continue to be within normal limits.  Her most recent EGD and colonoscopy on June 17, 2018 did not reveal specific source of GI bleed, but did note chronic gastritis.  She does not require additional IV iron today.  Her last dose of IV Feraheme was on December 24, 2020.  For insurance purposes, patient's treatment will be switched to IV Venofer if needed.  Patient has not required treatment in over 1 year, therefore can be discharged from clinic.  No further interventions are needed.  Please refer patient back if there are any questions or concerns.   2.  B12 deficiency: Resolved.  Patient last received B12 injection on February 28, 2021.   3.  Thrombocytopenia: Resolved.   4.  Family history of multiple myeloma: Brother has multiple myeloma.  Currently, there is nothing in her laboratory work that suggests she has myeloma.  Can consider SPEP in the future, but this is not necessary at this point.    I provided 20 minutes of face-to-face video visit time during this encounter which included chart review, counseling, and coordination of care as documented above.   Patient expressed understanding and was in agreement with this plan. She also understands that She can call  clinic at any time with any questions, concerns, or complaints.    Lloyd Huger, MD   05/09/2022 9:53 AM

## 2022-05-05 ENCOUNTER — Inpatient Hospital Stay: Payer: PPO | Attending: Oncology

## 2022-05-05 DIAGNOSIS — D509 Iron deficiency anemia, unspecified: Secondary | ICD-10-CM | POA: Insufficient documentation

## 2022-05-05 DIAGNOSIS — Z862 Personal history of diseases of the blood and blood-forming organs and certain disorders involving the immune mechanism: Secondary | ICD-10-CM | POA: Insufficient documentation

## 2022-05-05 LAB — CBC WITH DIFFERENTIAL/PLATELET
Abs Immature Granulocytes: 0.02 10*3/uL (ref 0.00–0.07)
Basophils Absolute: 0 10*3/uL (ref 0.0–0.1)
Basophils Relative: 1 %
Eosinophils Absolute: 0.2 10*3/uL (ref 0.0–0.5)
Eosinophils Relative: 4 %
HCT: 42.5 % (ref 36.0–46.0)
Hemoglobin: 13.6 g/dL (ref 12.0–15.0)
Immature Granulocytes: 0 %
Lymphocytes Relative: 34 %
Lymphs Abs: 1.8 10*3/uL (ref 0.7–4.0)
MCH: 28.2 pg (ref 26.0–34.0)
MCHC: 32 g/dL (ref 30.0–36.0)
MCV: 88 fL (ref 80.0–100.0)
Monocytes Absolute: 0.6 10*3/uL (ref 0.1–1.0)
Monocytes Relative: 12 %
Neutro Abs: 2.6 10*3/uL (ref 1.7–7.7)
Neutrophils Relative %: 49 %
Platelets: 221 10*3/uL (ref 150–400)
RBC: 4.83 MIL/uL (ref 3.87–5.11)
RDW: 14.9 % (ref 11.5–15.5)
WBC: 5.2 10*3/uL (ref 4.0–10.5)
nRBC: 0 % (ref 0.0–0.2)

## 2022-05-05 LAB — FERRITIN: Ferritin: 13 ng/mL (ref 11–307)

## 2022-05-05 LAB — IRON AND TIBC
Iron: 82 ug/dL (ref 28–170)
Saturation Ratios: 20 % (ref 10.4–31.8)
TIBC: 416 ug/dL (ref 250–450)
UIBC: 334 ug/dL

## 2022-05-05 LAB — VITAMIN B12: Vitamin B-12: 954 pg/mL — ABNORMAL HIGH (ref 180–914)

## 2022-05-07 ENCOUNTER — Telehealth: Payer: Self-pay

## 2022-05-07 NOTE — Progress Notes (Signed)
Chronic Care Management Pharmacy Assistant   Name: Alice Donaldson  MRN: [id number] DOB: [birth year]  Reason for Encounter: CCM (Medication Adherence and Delivery Coordination)  Recent office visits:  None since last CCM contact  Recent consult visits:  03/25/22 Cataract Extraction and Intraocular lens placement 03/06/22 Birder Robson (Ophthalmology): Age-related nuclear cataract 02/21/22 Birder Robson (Ophthalmology): Vitreous degeneration, bilateral.   Hospital visits:  None in previous 6 months  Medications: Outpatient Encounter Medications as of 05/07/2022  Medication Sig   acetaminophen (TYLENOL) 500 MG tablet Take 1,000 mg by mouth every 8 (eight) hours as needed.    albuterol (PROAIR HFA) 108 (90 Base) MCG/ACT inhaler INHALE 2 PUFFS INTO THE LUNGS EVERY 6 (SIX) HOURS AS NEEDED FOR WHEEZING. (Patient taking differently: Inhale 2 puffs into the lungs every 6 (six) hours as needed for wheezing.)   apixaban (ELIQUIS) 5 MG TABS tablet Take 1 tablet (5 mg total) by mouth 2 (two) times daily.   cetirizine (ZYRTEC) 10 MG tablet Take 10 mg by mouth at bedtime.    cholecalciferol (VITAMIN D) 1000 units tablet Take 1,000 Units by mouth daily.   docusate sodium (COLACE) 100 MG capsule Take 100 mg by mouth daily as needed.    etodolac (LODINE) 500 MG tablet TAKE ONE TABLET BY MOUTH TWICE DAILY AS NEEDED   Glucos-Chondroit-Hyaluron-MSM (GLUCOSAMINE CHONDROITIN JOINT) TABS Take 2 tablets by mouth daily.   LORazepam (ATIVAN) 0.5 MG tablet TAKE 1/2 TO 1 TABLET BY MOUTH AT bedtime   Menthol-Methyl Salicylate (SALONPAS PAIN RELIEF PATCH EX) Apply topically. prn   niacin 500 MG tablet Take 500 mg by mouth daily.   nortriptyline (PAMELOR) 10 MG capsule Take 2 capsules by mouth at bedtime.   pantoprazole (PROTONIX) 40 MG tablet TAKE ONE TABLET BY MOUTH TWICE DAILY. Needs appointment for further refills   polyethylene glycol (MIRALAX / GLYCOLAX) packet Take 17 g by mouth daily as needed for  moderate constipation.    tiZANidine (ZANAFLEX) 2 MG tablet TAKE ONE TABLET BY MOUTH EVERYDAY AT BEDTIME AS NEEDED FOR muscle SPASMS   No facility-administered encounter medications on file as of 05/07/2022.   BP Readings from Last 3 Encounters:  03/25/22 (!) 158/92  01/06/22 (!) 159/97  01/02/22 130/80    Lab Results  Component Value Date   HGBA1C 6.1 04/18/2015    Recent OV, Consult or Hospital visit:  No medication changes indicated  Last adherence delivery date: 02/25/2022      Patient is due for next adherence delivery on: 05/20/2022  Spoke with patient on 05/20/2022 reviewed medications and coordinated delivery.  This delivery to include: Vials  90 Days  Packs: No Packs   VIAL medications:   Eliquis 5 mg - 1 tablet twice daily (1 morning and 1 evening) - 16 tablets remaining Etodolac 500 mg- 1 tablet twice daily (1 morning and 1 evening) - 50 tablets remaining Lorazepam 0.5 mg - 0.5 - 1 tablet daily (bedtime) -  2 1/2 bottles remaining   Patient declined the following medications this month: Tizanidine 2 mg- 1 capsule 3 times daily (PRN - only 1 at bedtime) - 1 full bottle remaining Pantoprazole 40 mg - 1 tab every morning and evening -  3 bottles remaining   Will continue to buy OTC Biotin 5000 mcg- 1 tablet daily  Vitamin D 1000 Units Glucosamine Chondroitin Joint tablets Cetirizine '10mg'$  Niacin 500 mg  Any concerns about your medications? No  How often do you forget or accidentally miss a dose? Never  Do you use a pillbox? Yes  Is patient in packaging No   No refill request needed.  Confirmed delivery date of 05/20/2022, advised patient that pharmacy will contact them the morning of delivery.  Recent blood pressure readings are as follows:  Patient does not have a blood pressure cuff at home.  Annual wellness visit in last year? Yes 01/02/2022 Most Recent BP reading: 158/92 on 03/25/2022   Charlene Brooke, CPP notified   [name], [place]  Pharmacy Assistant [phone]

## 2022-05-08 ENCOUNTER — Encounter: Payer: Self-pay | Admitting: Oncology

## 2022-05-08 ENCOUNTER — Inpatient Hospital Stay (HOSPITAL_BASED_OUTPATIENT_CLINIC_OR_DEPARTMENT_OTHER): Payer: PPO | Admitting: Oncology

## 2022-05-08 ENCOUNTER — Other Ambulatory Visit: Payer: Self-pay | Admitting: Internal Medicine

## 2022-05-08 DIAGNOSIS — D509 Iron deficiency anemia, unspecified: Secondary | ICD-10-CM

## 2022-05-08 NOTE — Progress Notes (Signed)
Patient denies any concerns today.  

## 2022-05-09 ENCOUNTER — Encounter: Payer: Self-pay | Admitting: Oncology

## 2022-05-21 ENCOUNTER — Ambulatory Visit: Payer: PPO | Admitting: Podiatry

## 2022-05-21 ENCOUNTER — Encounter: Payer: Self-pay | Admitting: Podiatry

## 2022-05-21 DIAGNOSIS — B351 Tinea unguium: Secondary | ICD-10-CM | POA: Diagnosis not present

## 2022-05-21 DIAGNOSIS — L03031 Cellulitis of right toe: Secondary | ICD-10-CM | POA: Diagnosis not present

## 2022-05-21 DIAGNOSIS — L603 Nail dystrophy: Secondary | ICD-10-CM

## 2022-05-21 DIAGNOSIS — L03032 Cellulitis of left toe: Secondary | ICD-10-CM | POA: Diagnosis not present

## 2022-05-21 NOTE — Progress Notes (Signed)
Subjective:  Patient ID: Alice Donaldson, female    DOB: 05-Jun-1946,  MRN: [id number] HPI Chief Complaint  Patient presents with   Nail Problem    Hallux nail right - dark, thick, loose, injured years ago, tender when polished or rubbed   New Patient (Initial Visit)    76 y.o. female presents with the above complaint.   ROS: Denies fever chills nausea vomiting muscle aches pains calf pain back pain chest pain shortness of breath.  Past Medical History:  Diagnosis Date   Allergic rhinitis, cause unspecified    Allergy    Anemia    Complication of anesthesia    Diverticulosis    DVT (deep venous thrombosis) ([place])    x2.  Last in 9/21   Gastric ulcer    in past   GERD (gastroesophageal reflux disease)    Hemorrhoids, internal    Hyperlipidemia    Motion sickness    ocean ships   Osteoarthrosis involving, or with mention of more than one site, but not specified as generalized, multiple sites    PONV (postoperative nausea and vomiting)    Ulcer (traumatic) of oral mucosa    Unspecified venous (peripheral) insufficiency    Vertigo    No episodes over 10 years   Past Surgical History:  Procedure Laterality Date   ABDOMINAL HYSTERECTOMY  1996   BREAST BIOPSY  2003   CATARACT EXTRACTION W/PHACO Right 03/25/2022   Procedure: CATARACT EXTRACTION PHACO AND INTRAOCULAR LENS PLACEMENT ([place]) RIGHT  VIVITY LENS 22.90 01:57.1;  Surgeon: Birder Robson, MD;  Location: [place];  Service: Ophthalmology;  Laterality: Right;  Latex   CHOLECYSTECTOMY  2000   COLONOSCOPY WITH PROPOFOL N/A 10/02/2015   Procedure: COLONOSCOPY WITH PROPOFOL;  Surgeon: Lollie Sails, MD;  Location: [facility] ENDOSCOPY;  Service: Endoscopy;  Laterality: N/A;   COLONOSCOPY WITH PROPOFOL N/A 06/17/2018   Procedure: COLONOSCOPY WITH PROPOFOL;  Surgeon: Toledo, Benay Pike, MD;  Location: [facility] ENDOSCOPY;  Service: Gastroenterology;  Laterality: N/A;   DEXA  5/14   Normal   ESOPHAGOGASTRODUODENOSCOPY (EGD)  WITH PROPOFOL N/A 06/17/2018   Procedure: ESOPHAGOGASTRODUODENOSCOPY (EGD) WITH PROPOFOL;  Surgeon: Toledo, Benay Pike, MD;  Location: [facility] ENDOSCOPY;  Service: Gastroenterology;  Laterality: N/A;   KNEE ARTHROSCOPY     right in 2007, left in 2011   PERIPHERAL VASCULAR THROMBECTOMY Left 08/27/2020   Procedure: PERIPHERAL VASCULAR THROMBECTOMY;  Surgeon: Algernon Huxley, MD;  Location: [place] [facility];  Service: Cardiovascular;  Laterality: Left;   TONSILLECTOMY AND ADENOIDECTOMY  childhood    Current Outpatient Medications:    acetaminophen (TYLENOL) 500 MG tablet, Take 1,000 mg by mouth every 8 (eight) hours as needed. , Disp: , Rfl:    albuterol (PROAIR HFA) 108 (90 Base) MCG/ACT inhaler, INHALE 2 PUFFS INTO THE LUNGS EVERY 6 (SIX) HOURS AS NEEDED FOR WHEEZING. (Patient taking differently: Inhale 2 puffs into the lungs every 6 (six) hours as needed for wheezing.), Disp: 8.5 each, Rfl: 1   apixaban (ELIQUIS) 5 MG TABS tablet, Take 1 tablet (5 mg total) by mouth 2 (two) times daily., Disp: 180 tablet, Rfl: 2   cetirizine (ZYRTEC) 10 MG tablet, Take 10 mg by mouth at bedtime. , Disp: , Rfl:    Cholecalciferol (VITAMIN D3) 25 MCG (1000 UT) CAPS, , Disp: , Rfl:    docusate sodium (COLACE) 100 MG capsule, Take 100 mg by mouth daily as needed. , Disp: , Rfl:    etodolac (LODINE) 500 MG tablet, TAKE ONE  TABLET BY MOUTH TWICE DAILY AS NEEDED, Disp: 180 tablet, Rfl: 0   Glucos-Chondroit-Hyaluron-MSM (GLUCOSAMINE CHONDROITIN JOINT) TABS, Take 2 tablets by mouth daily., Disp: , Rfl:    LORazepam (ATIVAN) 0.5 MG tablet, TAKE 1/2 TO 1 TABLET BY MOUTH AT bedtime, Disp: 30 tablet, Rfl: 0   Menthol-Methyl Salicylate (SALONPAS PAIN RELIEF PATCH EX), Apply topically. prn, Disp: , Rfl:    niacin (NIASPAN) 500 MG CR tablet, , Disp: , Rfl:    nortriptyline (PAMELOR) 10 MG capsule, Take 2 capsules by mouth at bedtime., Disp: , Rfl:    pantoprazole (PROTONIX) 40 MG tablet, TAKE ONE TABLET BY MOUTH TWICE DAILY. Needs  appointment for further refills, Disp: 180 tablet, Rfl: 3   polyethylene glycol (MIRALAX / GLYCOLAX) packet, Take 17 g by mouth daily as needed for moderate constipation. , Disp: , Rfl:    tiZANidine (ZANAFLEX) 2 MG tablet, TAKE ONE TABLET BY MOUTH EVERYDAY AT BEDTIME AS NEEDED FOR muscle SPASMS, Disp: 90 tablet, Rfl: 0  Allergies  Allergen Reactions   Prevpac [Amoxicill-Clarithro-Lansopraz] Other (See Comments)    Throat closed up   Latex Rash    Had issues when wearing powdered gloves all day long while working.   Duloxetine Other (See Comments)    Nausea, diarrhea and jittery   Tramadol     GI symptoms   Review of Systems Objective:  There were no vitals filed for this visit.  General: Well developed, nourished, in no acute distress, alert and oriented x3   Dermatological: Skin is warm, dry and supple bilateral. Nails x 10 are well maintained; remaining integument appears unremarkable at this time. There are no open sores, no preulcerative lesions, no rash or signs of infection present.  Discoloration to the hallux nail right.  The nail plate appears to be of normal thickness but discoloration.  There also appears to be dry cuticle and some discoloration to the proximal nail fold and lunula.  Vascular: Dorsalis Pedis artery and Posterior Tibial artery pedal pulses are 2/4 bilateral with immedate capillary fill time. Pedal hair growth present. No varicosities and no lower extremity edema present bilateral.   Neruologic: Grossly intact via light touch bilateral. Vibratory intact via tuning fork bilateral. Protective threshold with Semmes Wienstein monofilament intact to all pedal sites bilateral. Patellar and Achilles deep tendon reflexes 2+ bilateral. No Babinski or clonus noted bilateral.   Musculoskeletal: No gross boney pedal deformities bilateral. No pain, crepitus, or limitation noted with foot and ankle range of motion bilateral. Muscular strength 5/5 in all groups tested  bilateral.  Gait: Unassisted, Nonantalgic.    Radiographs:  None taken  Assessment & Plan:   Assessment: Nail dystrophy hallux right  Plan: Samples of the skin and nail were taken today for pathologic evaluation.  Follow-up with her in 1 month.  She does have blood dyscrasias which may limit our treatment options.     Stephen Baruch T. [place], [place]

## 2022-05-23 ENCOUNTER — Encounter: Payer: Self-pay | Admitting: Internal Medicine

## 2022-06-05 ENCOUNTER — Other Ambulatory Visit: Payer: Self-pay | Admitting: Internal Medicine

## 2022-06-05 NOTE — Telephone Encounter (Signed)
Last filled 02-28-22 #30 Last OV 01-02-22 Next OV 01-06-23 [facility]

## 2022-06-12 ENCOUNTER — Telehealth: Payer: Self-pay | Admitting: Podiatry

## 2022-06-12 NOTE — Telephone Encounter (Signed)
Patient called and stated that she needed to cx her appointment because she will be out of town and she would like Dr. Milinda Pointer to call her with the results and if there would be any treatment she can then call back and schedule an appointment

## 2022-06-18 ENCOUNTER — Ambulatory Visit: Payer: PPO | Admitting: Podiatry

## 2022-06-24 DIAGNOSIS — Z1231 Encounter for screening mammogram for malignant neoplasm of breast: Secondary | ICD-10-CM | POA: Diagnosis not present

## 2022-06-27 DIAGNOSIS — H26491 Other secondary cataract, right eye: Secondary | ICD-10-CM | POA: Diagnosis not present

## 2022-07-02 ENCOUNTER — Ambulatory Visit
Admission: RE | Admit: 2022-07-02 | Discharge: 2022-07-02 | Disposition: A | Discharge status: Home / Self Care | Payer: PPO | Source: Ambulatory Visit | Attending: Oncology | Admitting: Oncology

## 2022-07-02 DIAGNOSIS — J9811 Atelectasis: Secondary | ICD-10-CM | POA: Diagnosis not present

## 2022-07-02 DIAGNOSIS — R918 Other nonspecific abnormal finding of lung field: Secondary | ICD-10-CM | POA: Diagnosis not present

## 2022-07-02 DIAGNOSIS — R911 Solitary pulmonary nodule: Secondary | ICD-10-CM | POA: Insufficient documentation

## 2022-07-04 ENCOUNTER — Encounter: Payer: Self-pay | Admitting: Medical Oncology

## 2022-07-04 ENCOUNTER — Inpatient Hospital Stay: Payer: PPO | Attending: Oncology | Admitting: Medical Oncology

## 2022-07-04 DIAGNOSIS — R911 Solitary pulmonary nodule: Secondary | ICD-10-CM | POA: Diagnosis not present

## 2022-07-04 DIAGNOSIS — K449 Diaphragmatic hernia without obstruction or gangrene: Secondary | ICD-10-CM

## 2022-07-04 NOTE — Progress Notes (Signed)
Virtual Visit Progress Note  Ms. Dimario,you are scheduled for a virtual visit with your provider today.    Just as we do with appointments in the office, we must obtain your consent to participate.  Your consent will be active for this visit and any virtual visit you may have with one of our providers in the next 365 days.    If you have a MyChart account, I can also send a copy of this consent to you electronically.  All virtual visits are billed to your insurance company just like a traditional visit in the office.  As this is a virtual visit, video technology does not allow for your provider to perform a traditional examination.  This may limit your provider's ability to fully assess your condition.  If your provider identifies any concerns that need to be evaluated in person or the need to arrange testing such as labs, EKG, etc, we will make arrangements to do so.    Although advances in technology are sophisticated, we cannot ensure that it will always work on either your end or our end.  If the connection with a video visit is poor, we may have to switch to a telephone visit.  With either a video or telephone visit, we are not always able to ensure that we have a secure connection.   I need to obtain your verbal consent now.   Are you willing to proceed with your visit today?   Zafiro C Cham has provided verbal consent on 07/04/2022 for a virtual visit (video or telephone).   Hughie Closs, PA-C 07/04/2022  5:09 PM    I connected with Odean C Hillyard on 07/04/22 at 10:30 AM EDT by video enabled telemedicine visit and verified that I am speaking with the correct person using two identifiers.   I discussed the limitations, risks, security and privacy concerns of performing an evaluation and management service by telemedicine and the availability of in-person appointments. I also discussed with the patient that there may be a patient responsible charge related to this service. The  patient expressed understanding and agreed to proceed.   Other persons participating in the visit and their role in the encounter: None   Patient's location: Home Provider's location: Clinic   Chief Complaint:   Pulmonary Nodules: Was referred for CT evaluation for lung nodules with history of smoking. She reports that she feels well. No new chronic cough, hemoptysis, SOB. Smoked for 20 years smoking 1 pack per week. She was a Copywriter, advertising. No other smokers in the house. She did grow up with parents who smoked in the household. Wishes to have continued yearly CT screenings.     Patient Care Team: [name], MD as PCP - General (Pediatrics) Lloyd Huger, MD as Consulting Physician (Hematology and Oncology) [name], [facility] as Pharmacist (Pharmacist)   Name of the patient: Alice Donaldson  [id number]  [birth year]   Date of visit: 07/04/22  History of Presenting Illness-   Review of systems- ROS   Allergies  Allergen Reactions   Prevpac [Amoxicill-Clarithro-Lansopraz] Other (See Comments)    Throat closed up   Latex Rash    Had issues when wearing powdered gloves all day long while working.   Duloxetine Other (See Comments)    Nausea, diarrhea and jittery   Tramadol     GI symptoms    Past Medical History:  Diagnosis Date   Allergic rhinitis, cause unspecified    Allergy  Anemia    Complication of anesthesia    Diverticulosis    DVT (deep venous thrombosis) ([facility])    x2.  Last in 9/21   Gastric ulcer    in past   GERD (gastroesophageal reflux disease)    Hemorrhoids, internal    Hyperlipidemia    Motion sickness    ocean ships   Osteoarthrosis involving, or with mention of more than one site, but not specified as generalized, multiple sites    PONV (postoperative nausea and vomiting)    Ulcer (traumatic) of oral mucosa    Unspecified venous (peripheral) insufficiency    Vertigo    No episodes over 10 years    Past Surgical History:   Procedure Laterality Date   ABDOMINAL HYSTERECTOMY  1996   BREAST BIOPSY  2003   CATARACT EXTRACTION W/PHACO Right 03/25/2022   Procedure: CATARACT EXTRACTION PHACO AND INTRAOCULAR LENS PLACEMENT ([place]) RIGHT  VIVITY LENS 22.90 01:57.1;  Surgeon: Birder Robson, MD;  Location: [place];  Service: Ophthalmology;  Laterality: Right;  Latex   CHOLECYSTECTOMY  2000   COLONOSCOPY WITH PROPOFOL N/A 10/02/2015   Procedure: COLONOSCOPY WITH PROPOFOL;  Surgeon: Lollie Sails, MD;  Location: [facility] ENDOSCOPY;  Service: Endoscopy;  Laterality: N/A;   COLONOSCOPY WITH PROPOFOL N/A 06/17/2018   Procedure: COLONOSCOPY WITH PROPOFOL;  Surgeon: Toledo, Benay Pike, MD;  Location: [facility] ENDOSCOPY;  Service: Gastroenterology;  Laterality: N/A;   DEXA  5/14   Normal   ESOPHAGOGASTRODUODENOSCOPY (EGD) WITH PROPOFOL N/A 06/17/2018   Procedure: ESOPHAGOGASTRODUODENOSCOPY (EGD) WITH PROPOFOL;  Surgeon: Toledo, Benay Pike, MD;  Location: [facility] ENDOSCOPY;  Service: Gastroenterology;  Laterality: N/A;   KNEE ARTHROSCOPY     right in 2007, left in 2011   PERIPHERAL VASCULAR THROMBECTOMY Left 08/27/2020   Procedure: PERIPHERAL VASCULAR THROMBECTOMY;  Surgeon: Algernon Huxley, MD;  Location: [place] CV LAB;  Service: Cardiovascular;  Laterality: Left;   TONSILLECTOMY AND ADENOIDECTOMY  childhood    Social History   Socioeconomic History   Marital status: Widowed    Spouse name: Not on file   Number of children: 1   Years of education: Not on file   Highest education level: Not on file  Occupational History   Occupation: Copywriter, advertising    Comment: retired  Tobacco Use   Smoking status: Former    Types: Cigarettes    Quit date: 1990    Years since quitting: 33.5   Smokeless tobacco: Never  Vaping Use   Vaping Use: Never used  Substance and Sexual Activity   Alcohol use: Yes    Alcohol/week: 2.0 standard drinks of alcohol    Types: 2 Glasses of wine per week    Comment: glass of wine 2x/wk    Drug use: No   Sexual activity: Not on file  Other Topics Concern   Not on file  Social History Narrative   Husband died March 09, 2023   Has living will   Asks for [name] to make decisions   Would accept resuscitation--but no prolonged ventilation   Doesn't want tube feeds if cognitively unaware   Social Determinants of Health   Financial Resource Strain: Low Risk  (04/08/2021)   Overall Financial Resource Strain (CARDIA)    Difficulty of Paying Living Expenses: Not very hard  Food Insecurity: Not on file  Transportation Needs: Not on file  Physical Activity: Not on file  Stress: Not on file  Social Connections: Not on file  Intimate Partner Violence: Not on file  Immunization History  Administered Date(s) Administered   Fluad Quad(high Dose 65+) 09/12/2020, 09/06/2021   Influenza Whole 08/26/2011   Influenza, High Dose Seasonal PF 09/04/2015, 09/01/2016   Influenza,inj,Quad PF,6+ Mos 11/01/2014, 09/03/2017, 09/16/2018, 09/09/2019   Influenza-Unspecified 11/01/2014, 09/03/2017   Moderna Sars-Covid-2 Vaccination 12/28/2019, 01/25/2020, 10/19/2020, 07/18/2021   Pneumococcal Conjugate-13 04/14/2014   Pneumococcal Polysaccharide-23 08/15/2010, 06/03/2017   Tdap 08/26/2011   Zoster, Live 12/15/2008    Family History  Problem Relation Age of Onset   Heart disease Mother    Heart disease Father    Diabetes Son 5       Type 1   Hypertension Son    Stroke Brother    Diabetes Other 2       type 1   Hyperlipidemia Neg Hx    Depression Neg Hx      Current Outpatient Medications:    acetaminophen (TYLENOL) 500 MG tablet, Take 1,000 mg by mouth every 8 (eight) hours as needed. , Disp: , Rfl:    albuterol (PROAIR HFA) 108 (90 Base) MCG/ACT inhaler, INHALE 2 PUFFS INTO THE LUNGS EVERY 6 (SIX) HOURS AS NEEDED FOR WHEEZING. (Patient taking differently: Inhale 2 puffs into the lungs every 6 (six) hours as needed for wheezing.), Disp: 8.5 each, Rfl: 1   apixaban (ELIQUIS) 5 MG TABS  tablet, Take 1 tablet (5 mg total) by mouth 2 (two) times daily., Disp: 180 tablet, Rfl: 2   cetirizine (ZYRTEC) 10 MG tablet, Take 10 mg by mouth at bedtime. , Disp: , Rfl:    Cholecalciferol (VITAMIN D3) 25 MCG (1000 UT) CAPS, , Disp: , Rfl:    docusate sodium (COLACE) 100 MG capsule, Take 100 mg by mouth daily as needed. , Disp: , Rfl:    etodolac (LODINE) 500 MG tablet, TAKE ONE TABLET BY MOUTH TWICE DAILY AS NEEDED, Disp: 180 tablet, Rfl: 0   Glucos-Chondroit-Hyaluron-MSM (GLUCOSAMINE CHONDROITIN JOINT) TABS, Take 2 tablets by mouth daily., Disp: , Rfl:    LORazepam (ATIVAN) 0.5 MG tablet, TAKE 1/2 TO 1 TABLET BY MOUTH AT bedtime, Disp: 30 tablet, Rfl: 0   Menthol-Methyl Salicylate (SALONPAS PAIN RELIEF PATCH EX), Apply topically. prn, Disp: , Rfl:    pantoprazole (PROTONIX) 40 MG tablet, TAKE ONE TABLET BY MOUTH TWICE DAILY. Needs appointment for further refills, Disp: 180 tablet, Rfl: 3   polyethylene glycol (MIRALAX / GLYCOLAX) packet, Take 17 g by mouth daily as needed for moderate constipation. , Disp: , Rfl:    tiZANidine (ZANAFLEX) 2 MG tablet, TAKE ONE TABLET BY MOUTH EVERYDAY AT BEDTIME AS NEEDED FOR muscle SPASMS, Disp: 90 tablet, Rfl: 0   nortriptyline (PAMELOR) 10 MG capsule, Take 2 capsules by mouth at bedtime. (Patient not taking: Reported on 07/04/2022), Disp: , Rfl:   Physical exam: Exam limited due to telemedicine Physical Exam    Assessment and plan- Patient is a 76 y.o. female    Visit Diagnosis 1. Lung nodule     1. Lung Nodule: CT scan reviewed and discussed with patient. She wishes to have yearly monitoring.   2. Hiatal hernia: Found on CT scan. Long discussion regarding the etiology, risks, management. Pt reports that she does not have any symptoms now that she is on a PPI. Discussed management. Follow up with general surgery PRN.   Patient expressed understanding and was in agreement with this plan. She also understands that She can call clinic at any time with  any questions, concerns, or complaints.   I discussed the assessment and treatment  plan with the patient. The patient was provided an opportunity to ask questions and all were answered. The patient agreed with the plan and demonstrated an understanding of the instructions.   The patient was advised to call back or seek an in-person evaluation if the symptoms worsen or if the condition fails to improve as anticipated.   I spent 25 minutes face-to-face video visit time dedicated to the care of this patient on the date of this encounter to include pre-visit review of labs, imaging, recent notes, face-to-face time with the patient, and post visit ordering of testing/documentation.    Thank you for allowing me to participate in the care of this very pleasant patient.   [facility] [employer]  CC:

## 2022-07-04 NOTE — Progress Notes (Signed)
Called pt and verified name and date of birth.  Pt medication reviewed and pt is aware of mychart video visit at 1030 am this morning.

## 2022-07-07 ENCOUNTER — Other Ambulatory Visit: Payer: Self-pay | Admitting: Internal Medicine

## 2022-07-07 NOTE — Telephone Encounter (Signed)
Refill request for lorazepam 0.5 mg tablet  LOV - 01/02/22 Next OV - 01/06/2023 Last refill - 06/05/22 #30/0

## 2022-08-05 ENCOUNTER — Other Ambulatory Visit: Payer: Self-pay | Admitting: Internal Medicine

## 2022-08-06 ENCOUNTER — Telehealth: Payer: Self-pay

## 2022-08-06 ENCOUNTER — Ambulatory Visit: Payer: PPO | Admitting: Podiatry

## 2022-08-06 NOTE — Progress Notes (Signed)
Chronic Care Management Pharmacy Assistant   Name: Verline C Kief  MRN: [id number] DOB: 1946/03/29  Reason for Encounter: CCM (Medication Adherence and Delivery Coordination)  Recent office visits:  None since last CCM contact  Recent consult visits:  07/04/22 Nelwyn Salisbury, PA (Oncology) Lung nodule - Results of CT - Hiatal Hernia - yearly monitoring.  07/02/22 CT Chest 06/24/22 Mammogram - results normal 06/12/22 Telephone from Podiatry: Results: Positive for fungus. 05/21/22 Max Hyatt, DPM Nail dystrophy Stop: Niacin 500 mg Procedure: Samples of the skin and nail were taken today for pathologic evaluation FU 4 weeks 05/08/22 Delight Hoh, MD Anemia No med changes  Hospital visits:  None since last CCM contact  Medications: Outpatient Encounter Medications as of 08/06/2022  Medication Sig   acetaminophen (TYLENOL) 500 MG tablet Take 1,000 mg by mouth every 8 (eight) hours as needed.    albuterol (PROAIR HFA) 108 (90 Base) MCG/ACT inhaler INHALE 2 PUFFS INTO THE LUNGS EVERY 6 (SIX) HOURS AS NEEDED FOR WHEEZING. (Patient taking differently: Inhale 2 puffs into the lungs every 6 (six) hours as needed for wheezing.)   apixaban (ELIQUIS) 5 MG TABS tablet Take 1 tablet (5 mg total) by mouth 2 (two) times daily.   cetirizine (ZYRTEC) 10 MG tablet Take 10 mg by mouth at bedtime.    Cholecalciferol (VITAMIN D3) 25 MCG (1000 UT) CAPS    docusate sodium (COLACE) 100 MG capsule Take 100 mg by mouth daily as needed.    etodolac (LODINE) 500 MG tablet TAKE ONE TABLET BY MOUTH twice daily AS NEEDED   Glucos-Chondroit-Hyaluron-MSM (GLUCOSAMINE CHONDROITIN JOINT) TABS Take 2 tablets by mouth daily.   LORazepam (ATIVAN) 0.5 MG tablet TAKE 1/2 TO 1 TABLET BY MOUTH AT bedtime   Menthol-Methyl Salicylate (SALONPAS PAIN RELIEF PATCH EX) Apply topically. prn   nortriptyline (PAMELOR) 10 MG capsule Take 2 capsules by mouth at bedtime. (Patient not taking: Reported on 07/04/2022)   pantoprazole  (PROTONIX) 40 MG tablet TAKE ONE TABLET BY MOUTH TWICE DAILY. Needs appointment for further refills   polyethylene glycol (MIRALAX / GLYCOLAX) packet Take 17 g by mouth daily as needed for moderate constipation.    tiZANidine (ZANAFLEX) 2 MG tablet TAKE ONE TABLET BY MOUTH AT bedtime AS NEEDED FOR muscle SPASMS   No facility-administered encounter medications on file as of 08/06/2022.   BP Readings from Last 3 Encounters:  03/25/22 (!) 158/92  01/06/22 (!) 159/97  01/02/22 130/80    Lab Results  Component Value Date   HGBA1C 6.1 04/18/2015    Recent OV, Consult or Hospital visit:  Recent medication changes indicated:   Stop: Niacin 500 mg   Last adherence delivery date: 05/20/2022      Patient is due for next adherence delivery on: 08/18/2022  Spoke with patient on 08/06/2022 reviewed medications and coordinated delivery.  This delivery to include: Vials  90 Days  Packs: No Packs   VIAL medications:   Eliquis 5 mg - 1 tablet twice daily (1 morning and 1 evening) - 16 tablets remaining Etodolac 500 mg- 1 tablet twice daily (1 morning and 1 evening) - 50 tablets remaining Lorazepam 0.5 mg - 0.5 - 1 tablet daily (bedtime) -  2 1/2 bottles remaining Tizanidine 2 mg- 1 capsule 3 times daily (PRN - only 1 at bedtime)   Patient declined the following medications this month: Pantoprazole 40 mg - 1 tab every morning and evening -  Plenty on hand  Will continue to buy OTC Biotin 5000  mcg- 1 tablet daily  Vitamin D 1000 Units Glucosamine Chondroitin Joint tablets Cetirizine '10mg'$  Niacin 500 mg   Any concerns about your medications? No   How often do you forget or accidentally miss a dose? Never   Do you use a pillbox? Yes   Is patient in packaging No              Refills requested from providers include: Completed Etodolac 500 mg- 1 tablet twice daily (1 morning and 1 evening) - 50 tablets remaining Tizanidine 2 mg- 1 capsule 3 times daily (PRN - only 1 at  bedtime)  Confirmed delivery date of 08/18/2022, advised patient that pharmacy will contact them the morning of delivery.   Recent blood pressure readings are as follows:  Patient does not have a blood pressure cuff at home.   Annual wellness visit in last year? Yes 01/02/2022 Most Recent BP reading: 158/92 on 03/25/2022  Cycle dispensing form sent to Margaretmary Dys, PTM for review.

## 2022-08-11 ENCOUNTER — Ambulatory Visit: Payer: PPO | Admitting: Podiatry

## 2022-08-11 ENCOUNTER — Encounter: Payer: Self-pay | Admitting: Podiatry

## 2022-08-11 DIAGNOSIS — Z79899 Other long term (current) drug therapy: Secondary | ICD-10-CM | POA: Diagnosis not present

## 2022-08-11 DIAGNOSIS — L603 Nail dystrophy: Secondary | ICD-10-CM

## 2022-08-11 MED ORDER — TERBINAFINE HCL 250 MG PO TABS: 250.0000 mg | ORAL_TABLET | Freq: Every day | ORAL | 0 refills | Status: DC

## 2022-08-11 NOTE — Progress Notes (Signed)
She presents today for follow-up of her nail pathology results.  Objective: Pathology results do demonstrate saprophytic fungus today.  And recommendations is for oral Lamisil.  Assessment: Onychomycosis.  Plan: At this point we discussed topical therapy laser therapy and oral therapy.  She would like to try the oral therapy understanding that is only about 60% efficacy with this type of organism.  We did discuss the pros and cons of this medication and possible side effects and complications associated with that she understands that and is amendable to it.  Currently we are going to request blood work consisting of a complete metabolic panel should this be abnormal we will notify her immediately.  But we are going to go ahead and get her started on 30 days of Lamisil.  Blood work will be necessary next visit.

## 2022-08-13 ENCOUNTER — Encounter: Payer: Self-pay | Admitting: Podiatry

## 2022-08-14 ENCOUNTER — Telehealth: Payer: Self-pay | Admitting: Podiatry

## 2022-08-14 MED ORDER — CICLOPIROX 8 % EX SOLN: Freq: Every day | CUTANEOUS | 11 refills | Status: DC

## 2022-08-14 NOTE — Telephone Encounter (Signed)
Pt called to cancel her follow up appt. She states she has decided not to go with taking the oral medication prescribed. She would like to know if her insurance will cover the nail polish for toe nail fungus. If so, she would like a prescription for the polish instead.  Please advise

## 2022-08-14 NOTE — Addendum Note (Signed)
Addended by: Clovis Riley E on: 08/14/2022 01:46 PM   Modules accepted: Orders

## 2022-08-19 ENCOUNTER — Telehealth: Payer: Self-pay | Admitting: *Deleted

## 2022-08-19 NOTE — Telephone Encounter (Signed)
Patient is calling and asking for topical nail polish for fungus instead of the Lamisil pills, please advise.

## 2022-08-20 NOTE — Telephone Encounter (Signed)
Patient notified

## 2022-08-20 NOTE — Telephone Encounter (Signed)
Penlac has been sent to pharmacy,patient notified.

## 2022-08-29 ENCOUNTER — Telehealth: Payer: Self-pay

## 2022-08-29 NOTE — Progress Notes (Signed)
    Chronic Care Management Pharmacy Assistant   Name: Alice Donaldson  MRN: [id number] DOB: [birth year]  Reason for Encounter: CCM (Blood Pressure Reading)  Called patient for an updated blood pressure reading.  Patient has not been taking her blood pressure at home as she does not have a blood pressure cuff. She will have her blood pressure taken next time she has a doctors appointment.   Charlene Brooke, CPP notified  [name], [place] Clinical Pharmacy Assistant [phone]

## 2022-08-30 ENCOUNTER — Encounter: Payer: Self-pay | Admitting: Internal Medicine

## 2022-09-04 NOTE — Telephone Encounter (Signed)
Called , coud not get thru, will try later

## 2022-09-08 NOTE — Telephone Encounter (Signed)
This encounter was created in error - please disregard.

## 2022-09-10 ENCOUNTER — Ambulatory Visit: Payer: PPO | Admitting: Podiatry

## 2022-09-17 ENCOUNTER — Encounter: Payer: Self-pay | Admitting: Nurse Practitioner

## 2022-09-24 ENCOUNTER — Telehealth: Payer: Self-pay

## 2022-09-24 NOTE — Chronic Care Management (AMB) (Signed)
    Chronic Care Management Pharmacy Assistant   Name: Alice Donaldson  MRN: [id number] DOB: 12/31/1945  Reason for Encounter: Reminder Call     Recent office visits:  None in 6 months  Recent consult visits:  08/11/22-Max Hyatt,DPM-labs ordered, start on Lamisil '250mg'$  take 1 daily, f/u 1 month 07/04/22-Sarah Covington,PA(onco)-Telemedicine for f/u lung nodules,will monitor yearly 06/27/22-William Porfilio(ophth)- no data 05/21/22-Max Hyatt,DPM-toenail culture-f/u 1 month 05/08/22-Timothy Finnegan,MD(onco)-Telemedicine-lab results-Patient has not required treatment in over 1 year, therefore can be discharged from clinic.   Hospital visits:  None in previous 6 months  Medications: Outpatient Encounter Medications as of 09/24/2022  Medication Sig   acetaminophen (TYLENOL) 500 MG tablet Take 1,000 mg by mouth every 8 (eight) hours as needed.    albuterol (PROAIR HFA) 108 (90 Base) MCG/ACT inhaler INHALE 2 PUFFS INTO THE LUNGS EVERY 6 (SIX) HOURS AS NEEDED FOR WHEEZING. (Patient taking differently: Inhale 2 puffs into the lungs every 6 (six) hours as needed for wheezing.)   apixaban (ELIQUIS) 5 MG TABS tablet Take 1 tablet (5 mg total) by mouth 2 (two) times daily.   cetirizine (ZYRTEC) 10 MG tablet Take 10 mg by mouth at bedtime.    Cholecalciferol (VITAMIN D3) 25 MCG (1000 UT) CAPS    ciclopirox (PENLAC) 8 % solution Apply topically at bedtime. Apply to nail/surrounding skin and daily over previous coat. Every 7 days remove with alcohol and continue.   docusate sodium (COLACE) 100 MG capsule Take 100 mg by mouth daily as needed.    etodolac (LODINE) 500 MG tablet TAKE ONE TABLET BY MOUTH twice daily AS NEEDED   Glucos-Chondroit-Hyaluron-MSM (GLUCOSAMINE CHONDROITIN JOINT) TABS Take 2 tablets by mouth daily.   LORazepam (ATIVAN) 0.5 MG tablet TAKE 1/2 TO 1 TABLET BY MOUTH AT bedtime   Menthol-Methyl Salicylate (SALONPAS PAIN RELIEF PATCH EX) Apply topically. prn   nortriptyline (PAMELOR) 10  MG capsule Take 2 capsules by mouth at bedtime. (Patient not taking: Reported on 07/04/2022)   pantoprazole (PROTONIX) 40 MG tablet TAKE ONE TABLET BY MOUTH TWICE DAILY. Needs appointment for further refills   polyethylene glycol (MIRALAX / GLYCOLAX) packet Take 17 g by mouth daily as needed for moderate constipation.    terbinafine (LAMISIL) 250 MG tablet Take 1 tablet (250 mg total) by mouth daily.   tiZANidine (ZANAFLEX) 2 MG tablet TAKE ONE TABLET BY MOUTH AT bedtime AS NEEDED FOR muscle SPASMS   No facility-administered encounter medications on file as of 09/24/2022.    Alice Donaldson was contacted to remind of upcoming telephone visit with Charlene Brooke on 09/29/22 at 3:45. Patient was reminded to have any blood glucose and blood pressure readings available for review at appointment.   Message was left reminding patient of appointment.   CCM referral has been placed prior to visit?  Yes   Star Rating Drugs: Medication:  Last Fill: Day Supply No star medications identified   Charlene Brooke, CPP notified  Avel Sensor, [place]  [phone]

## 2022-09-29 ENCOUNTER — Ambulatory Visit: Payer: PPO | Admitting: Pharmacist

## 2022-09-29 DIAGNOSIS — E785 Hyperlipidemia, unspecified: Secondary | ICD-10-CM

## 2022-09-29 DIAGNOSIS — M48062 Spinal stenosis, lumbar region with neurogenic claudication: Secondary | ICD-10-CM

## 2022-09-29 DIAGNOSIS — K219 Gastro-esophageal reflux disease without esophagitis: Secondary | ICD-10-CM

## 2022-09-29 DIAGNOSIS — F419 Anxiety disorder, unspecified: Secondary | ICD-10-CM

## 2022-09-29 DIAGNOSIS — Z86711 Personal history of pulmonary embolism: Secondary | ICD-10-CM

## 2022-09-29 NOTE — Progress Notes (Unsigned)
Chronic Care Management Pharmacy Note  09/29/2022 Name:  Alice Donaldson MRN:  [id number] DOB:  1946-10-22  Summary: CCM F/U visit -Reviewed medications; pt affirms compliance as directed -Reviewed bleeding risk with Eliquis and etodolac; she is also on pantoprazole BID which offers some GI protection; she denies s/sx of bleeding -Reviewed risks of chronic NSAID use - kidney impairment, elevated BP. Pt is due for BMET to evaluate kidney function (last done 08/2020 - GFR was 53)  Recommendations/Changes made from today's visit: -Submitted tier exception request for etodolac -Recommend BMET at next OV to monitor kidney fxn (on chronic NSAID, Eliquis)  Plan: -[facility] will call patient periodically for medication delivery -Pharmacist follow up televisit PRN -PCP annual visit 01/06/23    Subjective: Senna Donaldson Alice is an 76 y.o. year old female who is a primary patient of Alice Carbon, MD.  The CCM team was consulted for assistance with disease management and care coordination needs.    Engaged with patient by telephone for follow up visit in response to provider referral for pharmacy case management and/or care coordination services.   Consent to Services:  The patient was given information about Chronic Care Management services, agreed to services, and gave verbal consent prior to initiation of services.  Please see initial visit note for detailed documentation.   Patient Care Team: Alice Carbon, MD as PCP - General (Pediatrics) Lloyd Huger, MD as Consulting Physician (Hematology and Oncology) Charlton Haws, [facility] as Pharmacist (Pharmacist)  Recent office visits: 01/02/22 Dr Silvio Pate OV: annual - hold off statin d/t myalgias previously. Will try herbalife for weight loss. RTC 1 year.  Recent consult visits: 09/17/22 pt message (Oncology): Donaldson/o stomach pain with eating. Advised Pepcid for 14 days, referral to surgery to discuss hiatal hernia. Pt  declined referral.   07/04/22 PA Nelwyn Salisbury (Oncology): lung nodule - CT yearly monitoring.  Hospital visits: None in previous 6 months   Objective:  Lab Results  Component Value Date   CREATININE 0.86 08/27/2020   BUN 14 08/27/2020   GFR 53.93 (L) 08/02/2020   GFRNONAA >60 08/27/2020   GFRAA >60 08/27/2020   NA 140 08/18/2020   K 4.3 08/18/2020   CALCIUM 9.4 08/18/2020   CO2 25 08/18/2020   GLUCOSE 96 08/18/2020    Lab Results  Component Value Date/Time   HGBA1C 6.1 04/18/2015 11:50 AM   GFR 53.93 (L) 08/02/2020 03:19 PM   GFR 70.71 06/03/2017 11:35 AM    Last diabetic Eye exam: No results found for: "HMDIABEYEEXA"  Last diabetic Foot exam: No results found for: "HMDIABFOOTEX"   Lab Results  Component Value Date   CHOL 196 05/06/2016   HDL 46.20 05/06/2016   LDLCALC 132 (H) 05/06/2016   LDLDIRECT 179.3 04/12/2013   TRIG 87.0 05/06/2016   CHOLHDL 4 05/06/2016       Latest Ref Rng & Units 08/02/2020    3:19 PM 06/15/2018    6:44 PM 06/03/2017   11:35 AM  Hepatic Function  Total Protein 6.0 - 8.3 g/dL 7.0  7.1  7.1   Albumin 3.5 - 5.2 g/dL 4.0  3.6  4.2   AST 0 - 37 U/L '15  18  19   ' ALT 0 - 35 U/L '13  15  18   ' Alk Phosphatase 39 - 117 U/L 131  108  138   Total Bilirubin 0.2 - 1.2 mg/dL 0.8  0.5  0.7     Lab Results  Component  Value Date/Time   TSH 0.85 04/14/2014 11:46 AM   TSH 1.20 04/12/2013 12:41 PM   FREET4 0.84 08/02/2020 03:19 PM   FREET4 1.05 05/06/2016 11:31 AM       Latest Ref Rng & Units 05/05/2022   11:21 AM 01/06/2022   12:43 PM 09/03/2021    1:36 PM  CBC  WBC 4.0 - 10.5 K/uL 5.2  5.3  5.4   Hemoglobin 12.0 - 15.0 g/dL 13.6  13.1  14.4   Hematocrit 36.0 - 46.0 % 42.5  41.2  44.7   Platelets 150 - 400 K/uL 221  244  247     No results found for: "VD25OH"  Clinical ASCVD: Yes  The ASCVD Risk score (Arnett DK, et al., 2019) failed to calculate for the following reasons:   Cannot find a previous HDL lab   Cannot find a previous  total cholesterol lab       01/02/2022    3:40 PM 08/02/2020    3:15 PM 06/03/2017   11:26 AM  Depression screen PHQ 2/9  Decreased Interest 0 0 0  Down, Depressed, Hopeless '1 1 1  ' PHQ - 2 Score '1 1 1     ' Social History   Tobacco Use  Smoking Status Former   Types: Cigarettes   Quit date: 1990   Years since quitting: 33.8  Smokeless Tobacco Never   BP Readings from Last 3 Encounters:  03/25/22 (!) 158/92  01/06/22 (!) 159/97  01/02/22 130/80   Pulse Readings from Last 3 Encounters:  03/25/22 62  01/06/22 86  01/02/22 81   Wt Readings from Last 3 Encounters:  03/25/22 232 lb 9.6 oz (105.5 kg)  01/06/22 245 lb 8 oz (111.4 kg)  01/02/22 245 lb (111.1 kg)   BMI Readings from Last 3 Encounters:  03/25/22 38.71 kg/m  01/06/22 40.85 kg/m  01/02/22 40.77 kg/m    Assessment/Interventions: Review of patient past medical history, allergies, medications, health status, including review of consultants reports, laboratory and other test data, was performed as part of comprehensive evaluation and provision of chronic care management services.   SDOH:  (Social Determinants of Health) assessments and interventions performed: No SDOH Interventions    Flowsheet Row Chronic Care Management from 04/02/2021 in [place] at [facility] Visit from 03/03/2016 in [place]  SDOH Interventions    Depression Interventions/Treatment  -- --  [is not on medication and has not seen psychiatrist]  Financial Strain Interventions Intervention Not Indicated --      SDOH Screenings   Depression (PHQ2-9): Low Risk  (01/02/2022)  Financial Resource Strain: Low Risk  (04/08/2021)  Tobacco Use: Medium Risk (08/11/2022)    CCM Care Plan  Allergies  Allergen Reactions   Prevpac [Amoxicill-Clarithro-Lansopraz] Other (See Comments)    Throat closed up   Latex Rash    Had issues when wearing powdered gloves all day long while working.    Duloxetine Other (See Comments)    Nausea, diarrhea and jittery   Tramadol     GI symptoms    Medications Reviewed Today     Reviewed by Charlton Haws, [facility] (Pharmacist) on 09/29/22 at [place] List Status: <None>   Medication Order Taking? Sig Documenting Provider Last Dose Status Informant  acetaminophen (TYLENOL) 500 MG tablet [id number] Yes Take 1,000 mg by mouth every 8 (eight) hours as needed.  [provider] Taking Active   albuterol [facility] HFA) [phone] Base) MCG/ACT inhaler [id number]  Yes INHALE 2 PUFFS INTO THE LUNGS EVERY 6 (SIX) HOURS AS NEEDED FOR WHEEZING.  Patient taking differently: Inhale 2 puffs into the lungs every 6 (six) hours as needed for wheezing.   Alice Carbon, MD Taking Active   apixaban (ELIQUIS) 5 MG TABS tablet [id number] Yes Take 1 tablet (5 mg total) by mouth 2 (two) times daily. Dutch Quint B, FNP Taking Active   Biotin (BIOTIN 5000) 5 MG CAPS [id number] Yes Take by mouth. [provider] Taking Active   cetirizine (ZYRTEC) 10 MG tablet [id number] Yes Take 10 mg by mouth at bedtime.  [provider] Taking Active Self  Cholecalciferol (VITAMIN D3) 25 MCG (1000 UT) CAPS [id number] Yes  [provider] Taking Active   ciclopirox (PENLAC) 8 % solution [id number] Yes Apply topically at bedtime. Apply to nail/surrounding skin and daily over previous coat. Every 7 days remove with alcohol and continue. Hyatt, Max T, DPM Taking Active   docusate sodium (COLACE) 100 MG capsule [id number] Yes Take 100 mg by mouth daily as needed.  [provider] Taking Active Self           Med Note Kathlen Mody, Renato Gails   Tue Jun 15, 2018  7:52 PM)    etodolac (LODINE) 500 MG tablet [id number] Yes TAKE ONE TABLET BY MOUTH twice daily AS NEEDED Alice Carbon, MD Taking Active   Glucos-Chondroit-Hyaluron-[facility] CHONDROITIN JOINT) TABS [id number] Yes Take 2 tablets by mouth daily. [provider] Taking Active Self   LORazepam (ATIVAN) 0.5 MG tablet [id number] Yes TAKE 1/2 TO 1 TABLET BY MOUTH AT bedtime Alice Carbon, MD Taking Active   Menthol-Methyl Salicylate [facility] PAIN RELIEF PATCH EX) [id number] Yes Apply topically. prn [provider] Taking Active   niacin (VITAMIN B3) 500 MG tablet [id number] Yes Take 500 mg by mouth at bedtime. [provider] Taking Active   pantoprazole (PROTONIX) 40 MG tablet [id number] Yes TAKE ONE TABLET BY MOUTH TWICE DAILY. Needs appointment for further refills Alice Carbon, MD Taking Active   polyethylene glycol [facility] / GLYCOLAX) packet [id number] Yes Take 17 g by mouth daily as needed for moderate constipation.  [provider] Taking Active Self  tiZANidine (ZANAFLEX) 2 MG tablet [id number] Yes TAKE ONE TABLET BY MOUTH AT bedtime AS NEEDED FOR muscle SPASMS Alice Carbon, MD Taking Active   Med List Note Lona Millard, RN 03/03/16 1454): Uds 03/04/2015            Patient Active Problem List   Diagnosis Date Noted   Aortic atherosclerosis ([place]) 01/02/2022   Recurrent pulmonary emboli ([place]) 08/21/2020   Gastritis 06/21/2018   Symptomatic anemia 06/15/2018   Morbid obesity ([place]) 06/03/2017   Iron deficiency anemia 08/14/2016   Advance directive discussed with patient 05/06/2016   Episodic mood disorder ([place]) 04/18/2015   Routine general medical examination at a health care facility 04/12/2013   GERD (gastroesophageal reflux disease)    Allergic rhinitis    Spinal stenosis, lumbar region, with neurogenic claudication    Hyperlipidemia    DVT (deep venous thrombosis) ([place])    Chronic venous insufficiency     Immunization History  Administered Date(s) Administered   Fluad Quad(high Dose 65+) 09/12/2020, 09/06/2021   Influenza Whole 08/26/2011   Influenza, High Dose Seasonal PF 09/04/2015, 09/01/2016   Influenza,inj,Quad PF,6+ Mos 11/01/2014, 09/03/2017, 09/16/2018, 09/09/2019   Influenza-Unspecified 11/01/2014,  09/03/2017   Moderna Sars-Covid-2 Vaccination 12/28/2019, 01/25/2020, 10/19/2020, 07/18/2021   Pneumococcal Conjugate-13 04/14/2014  Pneumococcal Polysaccharide-23 08/15/2010, 06/03/2017   Tdap 08/26/2011   Zoster, Live 12/15/2008    Conditions to be addressed/monitored:  Hyperlipidemia, GERD, and Anxiety, Hx recurrent VTE, chronic back pain  Care Plan : [place]  Updates made by Charlton Haws, [place] since 09/30/2022 12:00 AM     Problem: Hyperlipidemia, GERD, and Anxiety, Hx recurrent VTE, chronic back pain      Long-Range Goal: [place] mgmt   Start Date: 04/02/2021  Expected End Date: 09/30/2022  This Visit's Progress: On track  Priority: High  Note:   Current Barriers:  Due for BMET (on chronic NSAID, Eliquis)  Pharmacist Clinical Goal(s):  Patient will contact provider office for questions/concerns as evidenced notation of same in electronic health record through collaboration with PharmD and provider.   Interventions: 1:1 collaboration with Alice Carbon, MD regarding development and update of comprehensive plan of care as evidenced by provider attestation and co-signature Inter-disciplinary care team collaboration (see longitudinal plan of care) Comprehensive medication review performed; medication list updated in electronic medical record  Hyperlipidemia: (LDL goal < 100) -Not ideally controlled - last LDL 132 (04/2016); pt had myalgias with statin in the past, so PCP has opted to forego statin therapy and has not repeated lipid panel since would not treat anyway -Current treatment: Niacin 500 mg - Query appropriate -Medications previously tried: none -Encouraged reduction in dietary cholesterol  Prediabetes: (A1c goal < 6.4%) -Query controlled - A1c 6.1% (04/2015), due for repeat -Current treatment: None -Medications previously tried: none -Recommend updating A1c at follow up  Episodic mood disorder/anxiety (Goal: Control  symptoms) -Controlled - per patient report; Takes lorazepam most nights, helps with sleep/anxiety. -Current treatment: Lorazepam 0.5 mg - 1/2-1 tab HS - Appropriate, Effective, Safe, Accessible -Medications previously tried/failed: duloxetine  -Recommended to continue current medication  Chronic PE/DVT (Goal: Prevent clots) -Controlled -Hx recurrent PE, DVT. Denies bleeding s/sx -Current treatment  Eliquis 5 mg BID -Appropriate, Effective, Safe, Accessible -Medications previously tried: none -Reviewed bleeding risk with Eliquis, especially in combination with chronic NSAID; she is also taking PPI BID -Recommended to continue current medication; repeat BMET at next OV  GERD (Goal: minimize symptoms of reflux ) -Controlled - pt is on chronic NSAID and Eliquis, PPI is providing a degree of GI protection in addition to treating GERD symptoms -Current treatment  Pantoprazole 40 mg BID - Appropriate, Effective, Safe, Accessible -Medications previously tried: n/a -Hx of Bleeds/ulcers: No -Recommended to continue current medication  Chronic Back Pain (Goal: Control symptoms) -Controlled - per patient report; she reports etodolac is costly; per formulary it is Tier 3, Tier 2 drugs include celecoxib, indomethacin IR, naproxen DR 375 mg, sulindac; Tier 1 drugs include meloxicam, naproxen 250-500 mg, ibuprofen -Hx spinal stenosis w/ neurogenic claudication -Current treatment  Etodolac 500 mg BID - Appropriate, Effective, Safe, Query Accessible Tylenol 500 mg PRN -Appropriate, Effective, Safe, Accessible Tizanidine 2 mg TID PRN -Appropriate, Effective, Safe, Accessible Glucosamine-Chondroitin -Appropriate, Effective, Safe, Accessible -Medications previously tried: ibuprofen, tramadol, celecoxib  -Watch for bleeding signs/symptoms with Eliquis/Etodolac, continue PPI BID -Reviewed risks of chronic NSAID use - bleeding complications, kidney impairment, hypertension; pt has not had kidney evaluation  in 2 years; recommend BMET at next OV -Considered changing Etodolac to alternate NSAID, however given relative control on current medication and lack of GI/bleeding complications, agreed it made more sense to continue current therapy; will pursue tier exception for etodolac  Health Maintenance -Vaccine gaps: Flu, TDAP, Shingrix -Hx IDA - has had iron infusions in  the past; has had B12 injections; she has been drinking red wine and hasn't needed iron infusions since -Current therapy:  Biotin 5000 mcg daily  Vitamin D 1000 Units Glucosamine Chondroitin Joint tablets Cetirizine 75m -Patient is satisfied with current therapy and denies issues -Recommended to continue current medication  Patient Goals/Self-Care Activities Patient will:  - take medications as prescribed as evidenced by patient report and record review focus on medication adherence by routine       Medication Assistance: None required.  Patient affirms current coverage meets needs.  Compliance/Adherence/Medication fill history: Care Gaps: None  Star-Rating Drugs: None  Medication Access: Within the past 30 days, how often has patient missed a dose of medication? 0 Is a pillbox or other method used to improve adherence? No  Factors that may affect medication adherence? no barriers identified Are meds synced by current pharmacy? Yes  Are meds delivered by current pharmacy? Yes  Does patient experience delays in picking up medications due to transportation concerns? No   [facility] Reviewed: Is patient disadvantaged to use [facility]?: No  Current Rx insurance plan: HTA Name and location of [place]  [facility] [place] [id number]: [phone]: [phone] [facility] services reviewed with patient today?: Yes  90-day vials (last delivery 08/18/22): Eliquis 5 mg - 1 tablet  twice daily (1 morning and 1 evening) - 16 tablets remaining Etodolac 500 mg- 1 tablet twice daily (1 morning and 1 evening) - 50 tablets remaining Lorazepam 0.5 mg - 0.5 - 1 tablet daily (bedtime) -  2 1/2 bottles remaining Tizanidine 2 mg- 1 capsule 3 times daily (PRN - only 1 at bedtime)   Care Plan and Follow Up Patient Decision:  Patient agrees to Care Plan and Follow-up.  Plan: The patient has been provided with contact information for the care management team and has been advised to call with any health related questions or concerns.   LCharlene Brooke PharmD, BCACP Clinical Pharmacist [phone]

## 2022-09-30 NOTE — Patient Instructions (Signed)
Visit Information  Phone number for Pharmacist: [phone]   Goals Addressed   None     Care Plan : [place]  Updates made by Charlton Haws, [facility] since 09/30/2022 12:00 AM     Problem: Hyperlipidemia, GERD, and Anxiety, Hx recurrent VTE, chronic back pain      Long-Range Goal: [place] mgmt   Start Date: 04/02/2021  Expected End Date: 09/30/2022  This Visit's Progress: On track  Priority: High  Note:   Current Barriers:  Due for BMET (on chronic NSAID, Eliquis)  Pharmacist Clinical Goal(s):  Patient will contact provider office for questions/concerns as evidenced notation of same in electronic health record through collaboration with PharmD and provider.   Interventions: 1:1 collaboration with [name], MD regarding development and update of comprehensive plan of care as evidenced by provider attestation and co-signature Inter-disciplinary care team collaboration (see longitudinal plan of care) Comprehensive medication review performed; medication list updated in electronic medical record  Hyperlipidemia: (LDL goal < 100) -Not ideally controlled - last LDL 132 (04/2016); pt had myalgias with statin in the past, so PCP has opted to forego statin therapy and has not repeated lipid panel since would not treat anyway -Current treatment: Niacin 500 mg - Query appropriate -Medications previously tried: none -Encouraged reduction in dietary cholesterol  Prediabetes: (A1c goal < 6.4%) -Query controlled - A1c 6.1% (04/2015), due for repeat -Current treatment: None -Medications previously tried: none -Recommend updating A1c at follow up  Episodic mood disorder/anxiety (Goal: Control symptoms) -Controlled - per patient report; Takes lorazepam most nights, helps with sleep/anxiety. -Current treatment: Lorazepam 0.5 mg - 1/2-1 tab HS - Appropriate, Effective, Safe, Accessible -Medications previously tried/failed: duloxetine  -Recommended to continue  current medication  Chronic PE/DVT (Goal: Prevent clots) -Controlled -Hx recurrent PE, DVT. Denies bleeding s/sx -Current treatment  Eliquis 5 mg BID -Appropriate, Effective, Safe, Accessible -Medications previously tried: none -Reviewed bleeding risk with Eliquis, especially in combination with chronic NSAID; she is also taking PPI BID -Recommended to continue current medication; repeat BMET at next OV  GERD (Goal: minimize symptoms of reflux ) -Controlled - pt is on chronic NSAID and Eliquis, PPI is providing a degree of GI protection in addition to treating GERD symptoms -Current treatment  Pantoprazole 40 mg BID - Appropriate, Effective, Safe, Accessible -Medications previously tried: n/a -Hx of Bleeds/ulcers: No -Recommended to continue current medication  Chronic Back Pain (Goal: Control symptoms) -Controlled - per patient report; she reports etodolac is costly; per formulary it is Tier 3, Tier 2 drugs include celecoxib, indomethacin IR, naproxen DR 375 mg, sulindac; Tier 1 drugs include meloxicam, naproxen 250-500 mg, ibuprofen -Hx spinal stenosis w/ neurogenic claudication -Current treatment  Etodolac 500 mg BID - Appropriate, Effective, Safe, Query Accessible Tylenol 500 mg PRN -Appropriate, Effective, Safe, Accessible Tizanidine 2 mg TID PRN -Appropriate, Effective, Safe, Accessible Glucosamine-Chondroitin -Appropriate, Effective, Safe, Accessible -Medications previously tried: ibuprofen, tramadol, celecoxib  -Watch for bleeding signs/symptoms with Eliquis/Etodolac, continue PPI BID -Reviewed risks of chronic NSAID use - bleeding complications, kidney impairment, hypertension; pt has not had kidney evaluation in 2 years; recommend BMET at next OV -Considered changing Etodolac to alternate NSAID, however given relative control on current medication and lack of GI/bleeding complications, agreed it made more sense to continue current therapy; will pursue tier exception for  etodolac  Health Maintenance -Vaccine gaps: Flu, TDAP, Shingrix -Hx IDA - has had iron infusions in the past; has had B12 injections; she has been drinking red wine and  hasn't needed iron infusions since -Current therapy:  Biotin 5000 mcg daily  Vitamin D 1000 Units Glucosamine Chondroitin Joint tablets Cetirizine '10mg'$  -Patient is satisfied with current therapy and denies issues -Recommended to continue current medication  Patient Goals/Self-Care Activities Patient will:  - take medications as prescribed as evidenced by patient report and record review focus on medication adherence by routine       Patient verbalizes understanding of instructions and care plan provided today and agrees to view in [place]. Active MyChart status and patient understanding of how to access instructions and care plan via MyChart confirmed with patient.    Telephone follow up appointment with pharmacy team member scheduled for: PRN  Charlene Brooke, PharmD, BCACP Clinical Pharmacist [place] [facility] [phone]

## 2022-10-13 ENCOUNTER — Encounter (INDEPENDENT_AMBULATORY_CARE_PROVIDER_SITE_OTHER): Payer: Self-pay

## 2022-11-03 ENCOUNTER — Telehealth: Payer: Self-pay

## 2022-11-03 NOTE — Progress Notes (Unsigned)
Chronic Care Management Pharmacy Assistant   Name: Alice Donaldson  MRN: [id number] DOB: Mar 22, 1946  Reason for Encounter: CCM (Medication Adherence and Delivery Coordination)  Recent office visits:  None since last CCM contact  Recent consult visits:  None since last CCM contact  Hospital visits:  None in previous 6 months  Medications: Outpatient Encounter Medications as of 11/03/2022  Medication Sig   acetaminophen (TYLENOL) 500 MG tablet Take 1,000 mg by mouth every 8 (eight) hours as needed.    albuterol (PROAIR HFA) 108 (90 Base) MCG/ACT inhaler INHALE 2 PUFFS INTO THE LUNGS EVERY 6 (SIX) HOURS AS NEEDED FOR WHEEZING. (Patient taking differently: Inhale 2 puffs into the lungs every 6 (six) hours as needed for wheezing.)   apixaban (ELIQUIS) 5 MG TABS tablet Take 1 tablet (5 mg total) by mouth 2 (two) times daily.   Biotin (BIOTIN 5000) 5 MG CAPS Take by mouth.   cetirizine (ZYRTEC) 10 MG tablet Take 10 mg by mouth at bedtime.    Cholecalciferol (VITAMIN D3) 25 MCG (1000 UT) CAPS    ciclopirox (PENLAC) 8 % solution Apply topically at bedtime. Apply to nail/surrounding skin and daily over previous coat. Every 7 days remove with alcohol and continue.   docusate sodium (COLACE) 100 MG capsule Take 100 mg by mouth daily as needed.    etodolac (LODINE) 500 MG tablet TAKE ONE TABLET BY MOUTH twice daily AS NEEDED   Glucos-Chondroit-Hyaluron-MSM (GLUCOSAMINE CHONDROITIN JOINT) TABS Take 2 tablets by mouth daily.   LORazepam (ATIVAN) 0.5 MG tablet TAKE 1/2 TO 1 TABLET BY MOUTH AT bedtime   Menthol-Methyl Salicylate (SALONPAS PAIN RELIEF PATCH EX) Apply topically. prn   niacin (VITAMIN B3) 500 MG tablet Take 500 mg by mouth at bedtime.   pantoprazole (PROTONIX) 40 MG tablet TAKE ONE TABLET BY MOUTH TWICE DAILY. Needs appointment for further refills   polyethylene glycol (MIRALAX / GLYCOLAX) packet Take 17 g by mouth daily as needed for moderate constipation.    tiZANidine (ZANAFLEX)  2 MG tablet TAKE ONE TABLET BY MOUTH AT bedtime AS NEEDED FOR muscle SPASMS   No facility-administered encounter medications on file as of 11/03/2022.   BP Readings from Last 3 Encounters:  03/25/22 (!) 158/92  01/06/22 (!) 159/97  01/02/22 130/80    Lab Results  Component Value Date   HGBA1C 6.1 04/18/2015      No OVs, Consults, or hospital visits since last care coordination call / Pharmacist visit. No medication changes indicated  Last adherence delivery date: 08/18/2022      Patient is due for next adherence delivery on: 11/17/2022  {Med Review:25223}  This delivery to include: Vials  90 Days  Packs: No Packs   VIAL medications:   Eliquis 5 mg - 1 tablet twice daily (1 morning and 1 evening) - 16 tablets remaining Etodolac 500 mg- 1 tablet twice daily (1 morning and 1 evening) - 50 tablets remaining Lorazepam 0.5 mg - 0.5 - 1 tablet daily (bedtime) -  2 1/2 bottles remaining Tizanidine 2 mg- 1 capsule 3 times daily (PRN - only 1 at bedtime)    Patient declined the following medications this month: Pantoprazole 40 mg - 1 tab every morning and evening -  Plenty on hand   Will continue to buy OTC Biotin 5000 mcg- 1 tablet daily  Vitamin D 1000 Units Glucosamine Chondroitin Joint tablets Cetirizine '10mg'$  Niacin 500 mg   Any concerns about your medications? No   How often do you forget  or accidentally miss a dose? Never   Do you use a pillbox? Yes   Is patient in packaging No  Refills requested from providers include: Not complete Etodolac 500 mg- 1 tablet twice daily (1 morning and 1 evening)  Tizanidine 2 mg- 1 capsule 3 times daily (PRN - only 1 at bedtime)   {Delivery XOVA:91916} 11/17/2022  Recent blood pressure readings are as follows:  Patient does not have a blood pressure cuff at home.   Annual wellness visit in last year? Yes 01/02/2022 Most Recent BP reading: 158/92 on 03/25/2022  Cycle dispensing form sent to {Medreview:27653} for review.

## 2022-11-10 ENCOUNTER — Other Ambulatory Visit: Payer: Self-pay | Admitting: Family

## 2022-11-10 ENCOUNTER — Other Ambulatory Visit: Payer: Self-pay | Admitting: Internal Medicine

## 2022-11-10 NOTE — Telephone Encounter (Signed)
Lorazepam last filled 07-07-22 #30 Last OV 01-02-22 Next OV 01-06-23 [facility]

## 2022-11-28 DIAGNOSIS — J0110 Acute frontal sinusitis, unspecified: Secondary | ICD-10-CM | POA: Diagnosis not present

## 2022-11-28 DIAGNOSIS — Z6822 Body mass index (BMI) 22.0-22.9, adult: Secondary | ICD-10-CM | POA: Diagnosis not present

## 2022-11-28 DIAGNOSIS — R0602 Shortness of breath: Secondary | ICD-10-CM | POA: Diagnosis not present

## 2022-12-01 ENCOUNTER — Ambulatory Visit: Payer: PPO | Admitting: Family Medicine

## 2022-12-04 ENCOUNTER — Other Ambulatory Visit: Payer: Self-pay | Admitting: Family

## 2022-12-04 ENCOUNTER — Telehealth: Payer: Self-pay | Admitting: Internal Medicine

## 2022-12-04 MED ORDER — APIXABAN 5 MG PO TABS: 5.0000 mg | ORAL_TABLET | Freq: Two times a day (BID) | ORAL | 2 refills | Status: DC

## 2022-12-04 NOTE — Telephone Encounter (Signed)
  Encourage patient to contact the pharmacy for refills or they can request refills through [facility]  Did the patient contact the pharmacy:  yes   LAST APPOINTMENT DATE: 01/02/22  NEXT APPOINTMENT DATE:12/05/22(acute visit)  MEDICATION:apixaban (ELIQUIS) 5 MG TABS tablet   Is the patient out of medication? no  If not, how much is left? 2 pills left  Is this a 90 day supply: 180  PHARMACY: [facility] - [place], [place] - [address] Phone: [phone]  Fax: [phone]      Let patient know to contact pharmacy at the end of the day to make sure medication is ready.  Please notify patient to allow 48-72 hours to process

## 2022-12-05 ENCOUNTER — Encounter: Payer: Self-pay | Admitting: Nurse Practitioner

## 2022-12-05 ENCOUNTER — Ambulatory Visit (INDEPENDENT_AMBULATORY_CARE_PROVIDER_SITE_OTHER): Payer: PPO | Admitting: Nurse Practitioner

## 2022-12-05 ENCOUNTER — Other Ambulatory Visit: Payer: Self-pay

## 2022-12-05 ENCOUNTER — Emergency Department: Payer: PPO

## 2022-12-05 ENCOUNTER — Encounter: Payer: Self-pay | Admitting: Internal Medicine

## 2022-12-05 ENCOUNTER — Inpatient Hospital Stay
Admission: EM | Admit: 2022-12-05 | Discharge: 2022-12-16 | DRG: 177 | Disposition: A | Discharge status: Home Health | Payer: PPO | Attending: Internal Medicine | Admitting: Internal Medicine

## 2022-12-05 VITALS — BP 152/96 | HR 88 | Temp 97.9°F | Resp 16 | Ht 65.0 in

## 2022-12-05 DIAGNOSIS — Z961 Presence of intraocular lens: Secondary | ICD-10-CM | POA: Diagnosis present

## 2022-12-05 DIAGNOSIS — Z8249 Family history of ischemic heart disease and other diseases of the circulatory system: Secondary | ICD-10-CM | POA: Diagnosis not present

## 2022-12-05 DIAGNOSIS — K7689 Other specified diseases of liver: Secondary | ICD-10-CM | POA: Diagnosis present

## 2022-12-05 DIAGNOSIS — U071 COVID-19: Principal | ICD-10-CM | POA: Diagnosis present

## 2022-12-05 DIAGNOSIS — R0902 Hypoxemia: Secondary | ICD-10-CM | POA: Insufficient documentation

## 2022-12-05 DIAGNOSIS — K449 Diaphragmatic hernia without obstruction or gangrene: Secondary | ICD-10-CM | POA: Diagnosis not present

## 2022-12-05 DIAGNOSIS — Z91040 Latex allergy status: Secondary | ICD-10-CM | POA: Diagnosis not present

## 2022-12-05 DIAGNOSIS — Z79899 Other long term (current) drug therapy: Secondary | ICD-10-CM

## 2022-12-05 DIAGNOSIS — D509 Iron deficiency anemia, unspecified: Secondary | ICD-10-CM | POA: Diagnosis present

## 2022-12-05 DIAGNOSIS — Z87891 Personal history of nicotine dependence: Secondary | ICD-10-CM | POA: Diagnosis not present

## 2022-12-05 DIAGNOSIS — R0600 Dyspnea, unspecified: Secondary | ICD-10-CM | POA: Diagnosis not present

## 2022-12-05 DIAGNOSIS — I10 Essential (primary) hypertension: Secondary | ICD-10-CM | POA: Diagnosis present

## 2022-12-05 DIAGNOSIS — J9601 Acute respiratory failure with hypoxia: Secondary | ICD-10-CM | POA: Diagnosis not present

## 2022-12-05 DIAGNOSIS — Z86718 Personal history of other venous thrombosis and embolism: Secondary | ICD-10-CM | POA: Diagnosis not present

## 2022-12-05 DIAGNOSIS — Z888 Allergy status to other drugs, medicaments and biological substances status: Secondary | ICD-10-CM | POA: Diagnosis not present

## 2022-12-05 DIAGNOSIS — Z8711 Personal history of peptic ulcer disease: Secondary | ICD-10-CM

## 2022-12-05 DIAGNOSIS — E785 Hyperlipidemia, unspecified: Secondary | ICD-10-CM | POA: Diagnosis not present

## 2022-12-05 DIAGNOSIS — Z86711 Personal history of pulmonary embolism: Secondary | ICD-10-CM

## 2022-12-05 DIAGNOSIS — G4700 Insomnia, unspecified: Secondary | ICD-10-CM | POA: Diagnosis not present

## 2022-12-05 DIAGNOSIS — R059 Cough, unspecified: Secondary | ICD-10-CM | POA: Diagnosis not present

## 2022-12-05 DIAGNOSIS — R0602 Shortness of breath: Secondary | ICD-10-CM

## 2022-12-05 DIAGNOSIS — K219 Gastro-esophageal reflux disease without esophagitis: Secondary | ICD-10-CM | POA: Diagnosis present

## 2022-12-05 DIAGNOSIS — Z885 Allergy status to narcotic agent status: Secondary | ICD-10-CM

## 2022-12-05 DIAGNOSIS — Z823 Family history of stroke: Secondary | ICD-10-CM

## 2022-12-05 DIAGNOSIS — I739 Peripheral vascular disease, unspecified: Secondary | ICD-10-CM | POA: Diagnosis present

## 2022-12-05 DIAGNOSIS — Z6835 Body mass index (BMI) 35.0-35.9, adult: Secondary | ICD-10-CM | POA: Diagnosis not present

## 2022-12-05 DIAGNOSIS — J1282 Pneumonia due to coronavirus disease 2019: Secondary | ICD-10-CM | POA: Diagnosis not present

## 2022-12-05 DIAGNOSIS — J189 Pneumonia, unspecified organism: Secondary | ICD-10-CM

## 2022-12-05 DIAGNOSIS — I2699 Other pulmonary embolism without acute cor pulmonale: Secondary | ICD-10-CM | POA: Diagnosis present

## 2022-12-05 DIAGNOSIS — Z9841 Cataract extraction status, right eye: Secondary | ICD-10-CM

## 2022-12-05 DIAGNOSIS — F419 Anxiety disorder, unspecified: Secondary | ICD-10-CM | POA: Diagnosis present

## 2022-12-05 DIAGNOSIS — Z833 Family history of diabetes mellitus: Secondary | ICD-10-CM

## 2022-12-05 DIAGNOSIS — Z7901 Long term (current) use of anticoagulants: Secondary | ICD-10-CM | POA: Diagnosis not present

## 2022-12-05 DIAGNOSIS — J9811 Atelectasis: Secondary | ICD-10-CM | POA: Diagnosis not present

## 2022-12-05 DIAGNOSIS — J9611 Chronic respiratory failure with hypoxia: Secondary | ICD-10-CM | POA: Diagnosis present

## 2022-12-05 DIAGNOSIS — R051 Acute cough: Secondary | ICD-10-CM | POA: Diagnosis not present

## 2022-12-05 DIAGNOSIS — M48062 Spinal stenosis, lumbar region with neurogenic claudication: Secondary | ICD-10-CM | POA: Diagnosis present

## 2022-12-05 LAB — COMPREHENSIVE METABOLIC PANEL
ALT: 20 U/L (ref 0–44)
AST: 29 U/L (ref 15–41)
Albumin: 2.8 g/dL — ABNORMAL LOW (ref 3.5–5.0)
Alkaline Phosphatase: 110 U/L (ref 38–126)
Anion gap: 13 (ref 5–15)
BUN: 20 mg/dL (ref 8–23)
CO2: 23 mmol/L (ref 22–32)
Calcium: 9.7 mg/dL (ref 8.9–10.3)
Chloride: 104 mmol/L (ref 98–111)
Creatinine, Ser: 0.99 mg/dL (ref 0.44–1.00)
GFR, Estimated: 58 mL/min — ABNORMAL LOW (ref >60)
Glucose, Bld: 134 mg/dL — ABNORMAL HIGH (ref 70–99)
Potassium: 3.4 mmol/L — ABNORMAL LOW (ref 3.5–5.1)
Sodium: 140 mmol/L (ref 135–145)
Total Bilirubin: 1.4 mg/dL — ABNORMAL HIGH (ref 0.3–1.2)
Total Protein: 7.9 g/dL (ref 6.5–8.1)

## 2022-12-05 LAB — CBC WITH DIFFERENTIAL/PLATELET
Abs Immature Granulocytes: 0.5 10*3/uL — ABNORMAL HIGH (ref 0.00–0.07)
Basophils Absolute: 0.1 10*3/uL (ref 0.0–0.1)
Basophils Relative: 1 %
Eosinophils Absolute: 0.1 10*3/uL (ref 0.0–0.5)
Eosinophils Relative: 1 %
HCT: 38.3 % (ref 36.0–46.0)
Hemoglobin: 11.8 g/dL — ABNORMAL LOW (ref 12.0–15.0)
Immature Granulocytes: 3 %
Lymphocytes Relative: 19 %
Lymphs Abs: 2.8 10*3/uL (ref 0.7–4.0)
MCH: 25.3 pg — ABNORMAL LOW (ref 26.0–34.0)
MCHC: 30.8 g/dL (ref 30.0–36.0)
MCV: 82.2 fL (ref 80.0–100.0)
Monocytes Absolute: 0.9 10*3/uL (ref 0.1–1.0)
Monocytes Relative: 6 %
Neutro Abs: 10.1 10*3/uL — ABNORMAL HIGH (ref 1.7–7.7)
Neutrophils Relative %: 70 %
Platelets: 396 10*3/uL (ref 150–400)
RBC: 4.66 MIL/uL (ref 3.87–5.11)
RDW: 14.5 % (ref 11.5–15.5)
WBC: 14.5 10*3/uL — ABNORMAL HIGH (ref 4.0–10.5)
nRBC: 0 % (ref 0.0–0.2)

## 2022-12-05 LAB — BLOOD GAS, VENOUS
Acid-Base Excess: 0.8 mmol/L (ref 0.0–2.0)
Bicarbonate: 26 mmol/L (ref 20.0–28.0)
O2 Saturation: 45.9 %
Patient temperature: 37
pCO2, Ven: 43 mmHg — ABNORMAL LOW (ref 44–60)
pH, Ven: 7.39 (ref 7.25–7.43)
pO2, Ven: 35 mmHg (ref 32–45)

## 2022-12-05 LAB — BRAIN NATRIURETIC PEPTIDE: B Natriuretic Peptide: 76 pg/mL (ref 0.0–100.0)

## 2022-12-05 LAB — RESP PANEL BY RT-PCR (RSV, FLU A&B, COVID)  RVPGX2
Influenza A by PCR: NEGATIVE
Influenza B by PCR: NEGATIVE
Resp Syncytial Virus by PCR: NEGATIVE
SARS Coronavirus 2 by RT PCR: POSITIVE — AB

## 2022-12-05 LAB — PROCALCITONIN: Procalcitonin: 0.38 ng/mL

## 2022-12-05 MED ORDER — ACETAMINOPHEN 650 MG RE SUPP: 650.0000 mg | Freq: Four times a day (QID) | RECTAL | Status: DC | PRN

## 2022-12-05 MED ORDER — ONDANSETRON HCL 4 MG PO TABS: 4.0000 mg | ORAL_TABLET | Freq: Four times a day (QID) | ORAL | Status: DC | PRN

## 2022-12-05 MED ORDER — SODIUM CHLORIDE 0.9 % IV SOLN
2.0000 g | INTRAVENOUS | Status: AC
  Administered 2022-12-05 – 2022-12-09 (×5): 2 g via INTRAVENOUS
  Filled 2022-12-05 (×4): qty 20
  Filled 2022-12-05: qty 2

## 2022-12-05 MED ORDER — LEVOFLOXACIN IN D5W 750 MG/150ML IV SOLN
750.0000 mg | Freq: Once | INTRAVENOUS | Status: DC
  Administered 2022-12-05: 750 mg via INTRAVENOUS
  Filled 2022-12-05: qty 150

## 2022-12-05 MED ORDER — APIXABAN 5 MG PO TABS
5.0000 mg | ORAL_TABLET | Freq: Two times a day (BID) | ORAL | Status: DC
  Administered 2022-12-05 – 2022-12-16 (×21): 5 mg via ORAL
  Filled 2022-12-05 (×21): qty 1

## 2022-12-05 MED ORDER — POLYETHYLENE GLYCOL 3350 17 G PO PACK: 17.0000 g | PACK | Freq: Every day | ORAL | Status: DC | PRN

## 2022-12-05 MED ORDER — ADULT MULTIVITAMIN W/MINERALS CH
1.0000 | ORAL_TABLET | Freq: Every day | ORAL | Status: DC
  Administered 2022-12-05 – 2022-12-16 (×12): 1 via ORAL
  Filled 2022-12-05 (×12): qty 1

## 2022-12-05 MED ORDER — GUAIFENESIN-DM 100-10 MG/5ML PO SYRP
10.0000 mL | ORAL_SOLUTION | ORAL | Status: DC | PRN
  Administered 2022-12-06 – 2022-12-15 (×4): 10 mL via ORAL
  Filled 2022-12-05 (×4): qty 10

## 2022-12-05 MED ORDER — SODIUM CHLORIDE 0.9 % IV SOLN
500.0000 mg | INTRAVENOUS | Status: DC
  Administered 2022-12-05 – 2022-12-08 (×4): 500 mg via INTRAVENOUS
  Filled 2022-12-05: qty 500
  Filled 2022-12-05 (×3): qty 5

## 2022-12-05 MED ORDER — LACTATED RINGERS IV SOLN: INTRAVENOUS | Status: AC

## 2022-12-05 MED ORDER — ETODOLAC 300 MG PO CAPS: 500.0000 mg | ORAL_CAPSULE | Freq: Two times a day (BID) | ORAL | Status: DC | PRN

## 2022-12-05 MED ORDER — SODIUM CHLORIDE 0.9% FLUSH
3.0000 mL | Freq: Two times a day (BID) | INTRAVENOUS | Status: DC
  Administered 2022-12-05 – 2022-12-16 (×19): 3 mL via INTRAVENOUS

## 2022-12-05 MED ORDER — ACETAMINOPHEN 325 MG PO TABS
650.0000 mg | ORAL_TABLET | Freq: Four times a day (QID) | ORAL | Status: DC | PRN
  Administered 2022-12-08 – 2022-12-15 (×9): 650 mg via ORAL
  Filled 2022-12-05 (×10): qty 2

## 2022-12-05 MED ORDER — ETODOLAC 500 MG PO TABS: 500.0000 mg | ORAL_TABLET | Freq: Two times a day (BID) | ORAL | Status: DC | PRN

## 2022-12-05 MED ORDER — SODIUM CHLORIDE 0.9 % IV SOLN: 500.0000 mg | INTRAVENOUS | Status: DC

## 2022-12-05 MED ORDER — DEXAMETHASONE SODIUM PHOSPHATE 10 MG/ML IJ SOLN
6.0000 mg | INTRAMUSCULAR | Status: AC
  Administered 2022-12-05 – 2022-12-14 (×10): 6 mg via INTRAVENOUS
  Filled 2022-12-05 (×10): qty 1

## 2022-12-05 MED ORDER — LORAZEPAM 0.5 MG PO TABS: 0.2500 mg | ORAL_TABLET | Freq: Every evening | ORAL | Status: DC | PRN

## 2022-12-05 MED ORDER — HYDROCOD POLI-CHLORPHE POLI ER 10-8 MG/5ML PO SUER: 5.0000 mL | Freq: Two times a day (BID) | ORAL | Status: DC | PRN

## 2022-12-05 MED ORDER — ONDANSETRON HCL 4 MG/2ML IJ SOLN: 4.0000 mg | Freq: Four times a day (QID) | INTRAMUSCULAR | Status: DC | PRN

## 2022-12-05 NOTE — ED Notes (Signed)
Pt complained to RN that her NRB was painful. RN contacted RT for consult.

## 2022-12-05 NOTE — ED Triage Notes (Signed)
Pt presents to the ED via [facility] from [facility] due to SOB. Pt has had a cough x2weeks. Was given prednisone and Amoxicillin. Pt felt worse. Pt went to the UC today O2 was 80% RA. En route pt was given solu-medrol '125mg'$  and duo neb x1. Pt does not wear O2 at baseline. Pt denies N/V/D. Pt A&Ox4

## 2022-12-05 NOTE — Assessment & Plan Note (Signed)
-   Continue home etodolac

## 2022-12-05 NOTE — Assessment & Plan Note (Signed)
With history of DVT times and recurrent pulmonary embolus and patient requiring 4 L nasal cannula which she is not normally requiring any oxygen EMS was called and patient was sent to the local emergency department for further evaluation.

## 2022-12-05 NOTE — Assessment & Plan Note (Signed)
Low suspicion for PE at this time given patient has been compliant with Eliquis and clinical picture most consistent with pneumonia.  - Continue home Eliquis

## 2022-12-05 NOTE — Assessment & Plan Note (Signed)
Given acute hypoxia requiring 4 L of nasal cannula patient was sent via EMS to the nearest emergency department for further evaluation.

## 2022-12-05 NOTE — Progress Notes (Signed)
   Acute Office Visit  Subjective:     Patient ID: Vaniyah C Finan, female    DOB: [birth year], 76 y.o.   MRN: [id number]  Chief Complaint  Patient presents with   Cough    X 2 weeks which has gotten worse when walk in gave medication it gave her chills.     Patient is in today for Sick symptoms No sick contacts Went to the UC and 11/28/2022 and was given augmentin alubterol and prednison. Stopped all the medications as they made her feel worse and her symptoms have continued to deteriorate.  Patient is accompanied by family member.  Patient also was noted to be hypoxic at 80% when first brought back on room air.  Patient was placed on nasal cannula and was brought into the room.  States that she has a history of DVT last 2017 and required left lower leg stenting.  Patient has been on anticoagulation ever since.  She denies any PEs in the past.  Patient also denies any chest pain or any cardiac problems that she is aware of   Review of Systems  Constitutional:  Positive for chills and malaise/fatigue. Negative for fever.       Appetite decreased Fluid intake good   HENT:  Positive for sore throat (onset not know and was left sided and ear). Negative for ear discharge, ear pain and sinus pain.   Respiratory:  Positive for cough, sputum production (clear to green) and shortness of breath.   Cardiovascular:  Negative for chest pain.  Musculoskeletal:  Negative for joint pain and myalgias.  Neurological:  Positive for headaches.        Objective:    BP (!) 152/96   Pulse 88   Temp 97.9 F (36.6 C)   Resp 16   Ht '5\' 5"'$  (1.651 m)   LMP  (LMP Unknown)   SpO2 (!) 88%   BMI 38.71 kg/m    Physical Exam Vitals and nursing note reviewed.  Constitutional:      Appearance: Normal appearance.  Cardiovascular:     Rate and Rhythm: Normal rate and regular rhythm.     Heart sounds: Normal heart sounds.  Pulmonary:     Comments: Diminished breath sounds and slight labored  respirations. Lymphadenopathy:     Cervical: No cervical adenopathy.  Neurological:     Mental Status: She is alert.     No results found for any visits on 12/05/22.      Assessment & Plan:   Problem List Items Addressed This Visit       Respiratory   Hypoxia - Primary    With history of DVT times and recurrent pulmonary embolus and patient requiring 4 L nasal cannula which she is not normally requiring any oxygen EMS was called and patient was sent to the local emergency department for further evaluation.        Other   Acute cough    Given acute hypoxia requiring 4 L of nasal cannula patient was sent via EMS to the nearest emergency department for further evaluation.       No orders of the defined types were placed in this encounter.   No follow-ups on file.  Romilda Garret, NP

## 2022-12-05 NOTE — Assessment & Plan Note (Addendum)
PCR positive for COVID-19 with evidence of multifocal pneumonia on x-ray.  Given high oxygen requirements at this time, will start Decadron.  If oxygen requirements continue to increase, will consider Tocilizumab.  Unfortunately baricitinib is contraindicated given history of recurrent thrombosis.  - Continue supplemental oxygen to maintain oxygen saturation above 90% - Wean as tolerated - Decadron 6 mg IV daily  - Robitussin and Tussionex as needed for cough - Pulmonary toilet - If patient's oxygen requirements increase to [place], will start tocilizumab.  Discussed with patient, and she is in agreement. - Procalcitonin pending to evaluate for postviral bacterial component

## 2022-12-05 NOTE — ED Provider Notes (Signed)
[facility] Provider Note    Event Date/Time   First MD Initiated Contact with Patient 12/05/22 1341     (approximate)   History   Shortness of Breath   HPI  Alice Donaldson is a 76 y.o. female   Past medical history of VTE on Eliquis, iron deficient anemia, elevated BMI who presents to the emergency department with 2 weeks of worsening productive cough with green sputum production, shortness of breath and found to be hypoxemic to 80% on room air by EMS and put on nonrebreather with good response 95%.  She is tachypneic.    She was seen at outpatient clinic about 1 week ago and given Augmentin albuterol and prednisone but continued to get worse.  History was obtained via the patient.  Her cousin is at bedside to give collateral information as an independent historian corroborating information as given above.  I also reviewed outpatient clinic note from earlier today which states course of Augmentin/albuterol/prednisone prescribed last week and worsening despite all these medications.      Physical Exam   Triage Vital Signs: ED Triage Vitals  Enc Vitals Group     BP 12/05/22 1335 (!) 160/88     Pulse Rate 12/05/22 1335 (!) 108     Resp 12/05/22 1335 18     Temp 12/05/22 1335 98 F (36.7 C)     Temp Source 12/05/22 1335 Axillary     SpO2 12/05/22 1335 92 %     Weight --      Height --      Head Circumference --      Peak Flow --      Pain Score 12/05/22 1337 0     Pain Loc --      Pain Edu? --      Excl. in [place]? --     Most recent vital signs: Vitals:   12/05/22 1345 12/05/22 1413  BP: (!) 166/74   Pulse: (!) 101   Resp: (!) 30   Temp:    SpO2: 92% 95%    General: Awake, no distress.  CV:  Good peripheral perfusion.  Resp:  Normal effort.  Abd:  No distention.  Other:  She is tachypneic, mild tachycardia 100, hypertensive and afebrile.  Lung sounds without obvious wheezing or focalities.  She does have some rales at the bases  bilaterally and her legs appear mildly edematous though she states this is her baseline.   ED Results / Procedures / Treatments   Labs (all labs ordered are listed, but only abnormal results are displayed) Labs Reviewed  COMPREHENSIVE METABOLIC PANEL - Abnormal; Notable for the following components:      Result Value   Potassium 3.4 (*)    Glucose, Bld 134 (*)    Albumin 2.8 (*)    Total Bilirubin 1.4 (*)    GFR, Estimated 58 (*)    All other components within normal limits  CBC WITH DIFFERENTIAL/PLATELET - Abnormal; Notable for the following components:   WBC 14.5 (*)    Hemoglobin 11.8 (*)    MCH 25.3 (*)    Neutro Abs 10.1 (*)    Abs Immature Granulocytes 0.50 (*)    All other components within normal limits  BLOOD GAS, VENOUS - Abnormal; Notable for the following components:   pCO2, Ven 43 (*)    All other components within normal limits  RESP PANEL BY RT-PCR (RSV, FLU A&B, COVID)  [id number]  BRAIN NATRIURETIC PEPTIDE  I reviewed labs and they are notable for she has a white blood cell count of 14.5 and hemoglobin of 11.8.  EKG  ED ECG REPORT I, Lucillie Garfinkel, the attending physician, personally viewed and interpreted this ECG.   Date: 12/05/2022  EKG Time: 1425  Rate: 99  Rhythm: normal sinus rhythm  Axis: nl  Intervals:none  ST&T Change: no acute ischemic changes    RADIOLOGY I independently reviewed and interpreted chest x-ray and see diffuse opacities consistent with multifocal pneumonia or pulmonary edema   PROCEDURES:  Critical Care performed: Yes, see critical care procedure note(s)  .Critical Care  Performed by: Lucillie Garfinkel, MD Authorized by: Lucillie Garfinkel, MD   Critical care provider statement:    Critical care time (minutes):  30   Critical care was necessary to treat or prevent imminent or life-threatening deterioration of the following conditions:  Respiratory failure   Critical care was time spent personally by me on the following activities:   Development of treatment plan with patient or surrogate, discussions with consultants, evaluation of patient's response to treatment, examination of patient, ordering and review of laboratory studies, ordering and review of radiographic studies, ordering and performing treatments and interventions, pulse oximetry, re-evaluation of patient's condition and review of old charts    MEDICATIONS ORDERED IN ED: Medications  levofloxacin (LEVAQUIN) IVPB 750 mg (has no administration in time range)    Consultants:  I spoke with hospitalist for admission and regarding care plan for this patient.   IMPRESSION / MDM / ASSESSMENT AND PLAN / ED COURSE  I reviewed the triage vital signs and the nursing notes.                              Differential diagnosis includes, but is not limited to, pulmonary infection, bacterial pneumonia, viral URI, ACS, pulmonary edema, PE   The patient is on the cardiac monitor to evaluate for evidence of arrhythmia and/or significant heart rate changes.  MDM: This is a patient with productive cough most concerning for respiratory infection with hypoxemic respiratory failure requiring nonrebreather.  Her chest x-ray shows evidence of multifocal pneumonia so I will start her on antibiotics.  She has no history of heart failure and some baseline peripheral edema and some rales on examination, I will proceed with antibiotics, hold fluids given hemodynamic stability and that she is appear euvolemic otherwise, and get a proBNP to assess for possibility of pulmonary edema/CHF.  I do not think she has ACS because she does not have chest pain.  I do not think she has a PE because she has been fully compliant with her Eliquis.   Patient's presentation is most consistent with acute presentation with potential threat to life or bodily function.       FINAL CLINICAL IMPRESSION(S) / ED DIAGNOSES   Final diagnoses:  Multifocal pneumonia  Shortness of breath  Hypoxemia      Rx / DC Orders   ED Discharge Orders     None        Note:  This document was prepared using Dragon voice recognition software and may include unintentional dictation errors.    Lucillie Garfinkel, MD 12/05/22 [phone]

## 2022-12-05 NOTE — H&P (Signed)
History and Physical    Patient: Alice Donaldson YBW:[id number] DOB: 1946-05-05 DOA: 12/05/2022 DOS: the patient was seen and examined on 12/05/2022 PCP: [name], MD  Patient coming from: Home  Chief Complaint:  Chief Complaint  Patient presents with   Shortness of Breath   HPI: Alice Donaldson is a 76 y.o. female with medical history significant of recurrent PE/DVT on Eliquis, spinal stenosis, iron deficiency anemia, hyperlipidemia, gastric ulcer, who presents to the ED with complaints of shortness of breath.  [name] states that approximately 11 days ago, she developed a productive cough in addition to shortness of breath that has progressively worsened.  For the last 3 days, she has also been experiencing rigors with subsequent drenching sweats.  She endorses mild nausea that is fleeting, denies any vomiting, abdominal pain, diarrhea.  She denies any chest pain but endorses palpitations when walking for the last several days.  Per chart review, patient presented to her PCPs office today due to cough over the last 2 weeks.  On arrival, she was found to be saturating at 80% on room air and was placed on 4 L.  EMS was called at this time.  ED course: On arrival to the ED, patient was saturating at 92% on nonrebreather at 15 L, she was hypertensive at 166/74 with heart rate of 101.  She was tachypneic at 30/minute.  VBG with pCO2 of 43.  Remainder of workup remarkable includes COVID-19 PCR positive, WBC of 14.5, hemoglobin of 11.8, and potassium of 3.4.  Chest x-ray with evidence of multifocal pneumonia.  Due to this, patient was started on levofloxacin prior to COVID-19 test returning.  TRH contacted for admission for COVID-19 pneumonia.  Review of Systems: As mentioned in the history of present illness. All other systems reviewed and are negative.  Past Medical History:  Diagnosis Date   Allergic rhinitis, cause unspecified    Allergy    Anemia    Complication of  anesthesia    Diverticulosis    DVT (deep venous thrombosis) ([place])    x2.  Last in 9/21   Gastric ulcer    in past   GERD (gastroesophageal reflux disease)    Hemorrhoids, internal    Hyperlipidemia    Motion sickness    ocean ships   Osteoarthrosis involving, or with mention of more than one site, but not specified as generalized, multiple sites    PONV (postoperative nausea and vomiting)    Ulcer (traumatic) of oral mucosa    Unspecified venous (peripheral) insufficiency    Vertigo    No episodes over 10 years   Past Surgical History:  Procedure Laterality Date   ABDOMINAL HYSTERECTOMY  1996   BREAST BIOPSY  2003   CATARACT EXTRACTION W/PHACO Right 03/25/2022   Procedure: CATARACT EXTRACTION PHACO AND INTRAOCULAR LENS PLACEMENT ([place]) RIGHT  VIVITY LENS 22.90 01:57.1;  Surgeon: Birder Robson, MD;  Location: [facility];  Service: Ophthalmology;  Laterality: Right;  Latex   CHOLECYSTECTOMY  2000   COLONOSCOPY WITH PROPOFOL N/A 10/02/2015   Procedure: COLONOSCOPY WITH PROPOFOL;  Surgeon: Lollie Sails, MD;  Location: [facility] ENDOSCOPY;  Service: Endoscopy;  Laterality: N/A;   COLONOSCOPY WITH PROPOFOL N/A 06/17/2018   Procedure: COLONOSCOPY WITH PROPOFOL;  Surgeon: Toledo, Benay Pike, MD;  Location: [facility] ENDOSCOPY;  Service: Gastroenterology;  Laterality: N/A;   DEXA  5/14   Normal   ESOPHAGOGASTRODUODENOSCOPY (EGD) WITH PROPOFOL N/A 06/17/2018   Procedure: ESOPHAGOGASTRODUODENOSCOPY (EGD) WITH PROPOFOL;  Surgeon: Blissfield,  Benay Pike, MD;  Location: [facility] ENDOSCOPY;  Service: Gastroenterology;  Laterality: N/A;   KNEE ARTHROSCOPY     right in 2007, left in 2011   PERIPHERAL VASCULAR THROMBECTOMY Left 08/27/2020   Procedure: PERIPHERAL VASCULAR THROMBECTOMY;  Surgeon: Algernon Huxley, MD;  Location: [facility];  Service: Cardiovascular;  Laterality: Left;   TONSILLECTOMY AND ADENOIDECTOMY  childhood   Social History:  reports that she quit smoking about 33 years ago.  Her smoking use included cigarettes. She has never used smokeless tobacco. She reports current alcohol use of about 2.0 standard drinks of alcohol per week. She reports that she does not use drugs.  Allergies  Allergen Reactions   Prevpac [Amoxicill-Clarithro-Lansopraz] Other (See Comments)    Throat closed up   Latex Rash    Had issues when wearing powdered gloves all day long while working.   Duloxetine Other (See Comments)    Nausea, diarrhea and jittery   Tramadol     GI symptoms    Family History  Problem Relation Age of Onset   Heart disease Mother    Heart disease Father    Diabetes Son 5       Type 1   Hypertension Son    Stroke Brother    Diabetes Other 2       type 1   Hyperlipidemia Neg Hx    Depression Neg Hx     Prior to Admission medications   Medication Sig Start Date End Date Taking? Authorizing Provider  acetaminophen (TYLENOL) 500 MG tablet Take 1,000 mg by mouth every 8 (eight) hours as needed.     [provider]  albuterol (PROAIR HFA) 108 (90 Base) MCG/ACT inhaler INHALE 2 PUFFS INTO THE LUNGS EVERY 6 (SIX) HOURS AS NEEDED FOR WHEEZING. 05/06/16   [name], MD  apixaban (ELIQUIS) 5 MG TABS tablet Take 1 tablet (5 mg total) by mouth 2 (two) times daily. 12/04/22   Dutch Quint B, FNP  Biotin (BIOTIN 5000) 5 MG CAPS Take by mouth.    [provider]  cetirizine (ZYRTEC) 10 MG tablet Take 10 mg by mouth at bedtime.     [provider]  Cholecalciferol (VITAMIN D3) 25 MCG (1000 UT) CAPS  02/21/22   [provider]  ciclopirox (PENLAC) 8 % solution Apply topically at bedtime. Apply to nail/surrounding skin and daily over previous coat. Every 7 days remove with alcohol and continue. 08/14/22   Hyatt, Max T, DPM  docusate sodium (COLACE) 100 MG capsule Take 100 mg by mouth daily as needed.     [provider]  etodolac (LODINE) 500 MG tablet TAKE ONE TABLET BY MOUTH twice daily AS NEEDED 11/10/22   [name], MD  Glucos-Chondroit-Hyaluron-MSM (GLUCOSAMINE CHONDROITIN JOINT) TABS Take 2 tablets by mouth daily.    [provider]  LORazepam (ATIVAN) 0.5 MG tablet TAKE 1/2 TO 1 TABLET BY MOUTH AT bedtime 11/10/22   [name], MD  Menthol-Methyl Salicylate (SALONPAS PAIN RELIEF PATCH EX) Apply topically. prn    [provider]  niacin (VITAMIN B3) 500 MG tablet Take 500 mg by mouth at bedtime.    [provider]  pantoprazole (PROTONIX) 40 MG tablet TAKE ONE TABLET BY MOUTH TWICE DAILY. Needs appointment for further refills 02/12/22   Viviana Simpler I, MD  polyethylene glycol (MIRALAX / GLYCOLAX) packet Take 17 g by mouth daily as needed for moderate constipation.     [provider]  tiZANidine (  ZANAFLEX) 2 MG tablet TAKE ONE TABLET BY MOUTH AT bedtime AS NEEDED FOR muscle SPASMS 11/10/22   [name], MD    Physical Exam: Vitals:   12/05/22 1335 12/05/22 1345 12/05/22 1412 12/05/22 1413  BP: (!) 160/88 (!) 166/74    Pulse: (!) 108 (!) 101    Resp: 18 (!) 30    Temp: 98 F (36.7 C)     TempSrc: Axillary     SpO2: 92% 92%  95%  Weight:   107.2 kg   Height:   '5\' 8"'$  (1.727 m)    Physical Exam Vitals and nursing note reviewed.  HENT:     Head: Normocephalic and atraumatic.  Eyes:     Extraocular Movements: Extraocular movements intact.     Pupils: Pupils are equal, round, and reactive to light.  Cardiovascular:     Rate and Rhythm: Regular rhythm. Tachycardia present.     Heart sounds: No murmur heard.    No gallop.  Pulmonary:     Effort: Tachypnea and accessory muscle usage present. No respiratory distress.     Breath sounds: Rales (Diffuse Rales heard throughout all lung fields) present. No decreased breath sounds, wheezing or rhonchi.  Abdominal:     General: Bowel sounds are normal.     Palpations: Abdomen is soft.     Tenderness: There is no abdominal tenderness. There is no guarding.  Musculoskeletal:     Cervical back:  Neck supple.     Right lower leg: No tenderness. No edema.     Left lower leg: No tenderness. No edema.  Skin:    General: Skin is warm and dry.  Neurological:     General: No focal deficit present.     Mental Status: She is alert and oriented to person, place, and time.     Motor: No weakness.  Psychiatric:        Mood and Affect: Mood normal.        Behavior: Behavior normal.    Data Reviewed: CBC with WBC of 14.5, hemoglobin of 11.8, MCV of 82.2, and platelets of 396 BMP with potassium of 3.4, bicarb 23, glucose of 134, creatinine of 0.99, albumin of 2.8, total bilirubin of 1.4 and GFR 58.  LFTs otherwise within normal limits BNP within normal limits at 76.  COVID-19 PCR positive.  Influenza and RSV PCR negative.  EKG personally reviewed.  Sinus rhythm with rate of 99.  No ST changes or T wave inversions.  DG Chest Port 1 View  Result Date: 12/05/2022 CLINICAL DATA:  Shortness of breath with cough for 2 weeks. EXAM: PORTABLE CHEST 1 VIEW COMPARISON:  Radiographs 06/15/2018 and 02/08/2016.  CT 07/02/2022. FINDINGS: 1349 hours. The heart size and mediastinal contours are stable with a moderate-sized hiatal hernia. There are new patchy perihilar and lower lobe airspace opacities bilaterally suspicious for multilobar pneumonia. No pleural effusion or significant pleural effusion is identified. No acute osseous findings are demonstrated. There is a mild thoracolumbar scoliosis. Telemetry leads overlie the chest. IMPRESSION: New patchy perihilar and lower lobe airspace opacities bilaterally suspicious for multilobar pneumonia. Radiographic follow-up recommended to document clearing and exclude alternative etiologies. Electronically Signed   By: Richardean Sale M.D.   On: 12/05/2022 13:56    There are no new results to review at this time.  Assessment and Plan: * Pneumonia due to COVID-19 virus PCR positive for COVID-19 with evidence of multifocal pneumonia on x-ray.  Given high oxygen  requirements at this time, will  start Decadron.  If oxygen requirements continue to increase, will consider Tocilizumab.  Unfortunately baricitinib is contraindicated given history of recurrent thrombosis.  - Continue supplemental oxygen to maintain oxygen saturation above 90% - Wean as tolerated - Decadron 6 mg IV daily  - Robitussin and Tussionex as needed for cough - Pulmonary toilet - If patient's oxygen requirements increase to [place], will start tocilizumab.  Discussed with patient, and she is in agreement. - Procalcitonin pending to evaluate for postviral bacterial component  Recurrent pulmonary emboli (HCC) Low suspicion for PE at this time given patient has been compliant with Eliquis and clinical picture most consistent with pneumonia.  - Continue home Eliquis  Spinal stenosis, lumbar region, with neurogenic claudication - Continue home etodolac  Advance Care Planning:   Code Status: Full Code.  Verified with patient.  Consults: None  Family Communication: Patient states she will update her family.  Severity of Illness: The appropriate patient status for this patient is INPATIENT. Inpatient status is judged to be reasonable and necessary in order to provide the required intensity of service to ensure the patient's safety. The patient's presenting symptoms, physical exam findings, and initial radiographic and laboratory data in the context of their chronic comorbidities is felt to place them at high risk for further clinical deterioration. Furthermore, it is not anticipated that the patient will be medically stable for discharge from the hospital within 2 midnights of admission.   * I certify that at the point of admission it is my clinical judgment that the patient will require inpatient hospital care spanning beyond 2 midnights from the point of admission due to high intensity of service, high risk for further deterioration and high frequency of surveillance  required.*  Author: Jose Persia, MD 12/05/2022 4:30 PM  For on call review [email or web address].

## 2022-12-06 DIAGNOSIS — J9611 Chronic respiratory failure with hypoxia: Secondary | ICD-10-CM | POA: Diagnosis present

## 2022-12-06 DIAGNOSIS — I2699 Other pulmonary embolism without acute cor pulmonale: Secondary | ICD-10-CM | POA: Diagnosis not present

## 2022-12-06 DIAGNOSIS — J1282 Pneumonia due to coronavirus disease 2019: Secondary | ICD-10-CM | POA: Diagnosis not present

## 2022-12-06 DIAGNOSIS — U071 COVID-19: Secondary | ICD-10-CM | POA: Diagnosis not present

## 2022-12-06 DIAGNOSIS — J9601 Acute respiratory failure with hypoxia: Secondary | ICD-10-CM

## 2022-12-06 LAB — COMPREHENSIVE METABOLIC PANEL
ALT: 15 U/L (ref 0–44)
AST: 19 U/L (ref 15–41)
Albumin: 2.4 g/dL — ABNORMAL LOW (ref 3.5–5.0)
Alkaline Phosphatase: 88 U/L (ref 38–126)
Anion gap: 7 (ref 5–15)
BUN: 19 mg/dL (ref 8–23)
CO2: 26 mmol/L (ref 22–32)
Calcium: 9.7 mg/dL (ref 8.9–10.3)
Chloride: 109 mmol/L (ref 98–111)
Creatinine, Ser: 0.7 mg/dL (ref 0.44–1.00)
GFR, Estimated: >60 mL/min (ref >60)
Glucose, Bld: 157 mg/dL — ABNORMAL HIGH (ref 70–99)
Potassium: 4.1 mmol/L (ref 3.5–5.1)
Sodium: 142 mmol/L (ref 135–145)
Total Bilirubin: 0.8 mg/dL (ref 0.3–1.2)
Total Protein: 6.8 g/dL (ref 6.5–8.1)

## 2022-12-06 LAB — CBC WITH DIFFERENTIAL/PLATELET
Abs Immature Granulocytes: 0.51 10*3/uL — ABNORMAL HIGH (ref 0.00–0.07)
Basophils Absolute: 0 10*3/uL (ref 0.0–0.1)
Basophils Relative: 1 %
Eosinophils Absolute: 0 10*3/uL (ref 0.0–0.5)
Eosinophils Relative: 0 %
HCT: 33.1 % — ABNORMAL LOW (ref 36.0–46.0)
Hemoglobin: 10.3 g/dL — ABNORMAL LOW (ref 12.0–15.0)
Immature Granulocytes: 7 %
Lymphocytes Relative: 10 %
Lymphs Abs: 0.8 10*3/uL (ref 0.7–4.0)
MCH: 25.4 pg — ABNORMAL LOW (ref 26.0–34.0)
MCHC: 31.1 g/dL (ref 30.0–36.0)
MCV: 81.5 fL (ref 80.0–100.0)
Monocytes Absolute: 0.4 10*3/uL (ref 0.1–1.0)
Monocytes Relative: 5 %
Neutro Abs: 6.1 10*3/uL (ref 1.7–7.7)
Neutrophils Relative %: 77 %
Platelets: 351 10*3/uL (ref 150–400)
RBC: 4.06 MIL/uL (ref 3.87–5.11)
RDW: 14.3 % (ref 11.5–15.5)
WBC: 7.8 10*3/uL (ref 4.0–10.5)
nRBC: 0.3 % — ABNORMAL HIGH (ref 0.0–0.2)

## 2022-12-06 LAB — C-REACTIVE PROTEIN: CRP: 11.9 mg/dL — ABNORMAL HIGH (ref <1.0)

## 2022-12-06 MED ORDER — POLYETHYLENE GLYCOL 3350 17 G PO PACK
17.0000 g | PACK | Freq: Every day | ORAL | Status: DC
  Administered 2022-12-06 – 2022-12-16 (×7): 17 g via ORAL
  Filled 2022-12-06 (×10): qty 1

## 2022-12-06 MED ORDER — ENSURE ENLIVE PO LIQD
237.0000 mL | Freq: Two times a day (BID) | ORAL | Status: DC
  Administered 2022-12-06: 237 mL via ORAL

## 2022-12-06 MED ORDER — BARICITINIB 2 MG PO TABS
4.0000 mg | ORAL_TABLET | Freq: Every day | ORAL | Status: DC
  Administered 2022-12-06 – 2022-12-16 (×11): 4 mg via ORAL
  Filled 2022-12-06 (×3): qty 4
  Filled 2022-12-06: qty 2
  Filled 2022-12-06: qty 4
  Filled 2022-12-06 (×3): qty 2
  Filled 2022-12-06 (×5): qty 4

## 2022-12-06 NOTE — ED Notes (Signed)
Pt's family member called for update. Pt gave verbal consent for RN to update family member.

## 2022-12-06 NOTE — ED Notes (Signed)
Pt desatting to low 80s-85s. RT called, placed on NRB on top of high flow. Randol Kern NP paged and aware

## 2022-12-06 NOTE — Progress Notes (Addendum)
Progress Note    Gwyneth C Okelley  ZOX:[id number] DOB: 29-Sep-1946  DOA: 12/05/2022 PCP: [name], MD      Brief Narrative:    Medical records reviewed and are as summarized below:  Leinani C Windsor is a 76 y.o. 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  Symptoms started about 11 days prior to admission and progressively worsened.  She developed rigors with drenching sweats about 3 days prior to admission.  She went to her PCPs office because of worsening symptoms.  Her oxygen saturation was low and was requiring 4 L/min oxygen.  Subsequently, she was referred to the emergency department for further management.  She tested positive for COVID infection.  She was found to have [id number] pneumonia complicated by acute hypoxic respiratory failure.         Assessment/Plan:   Principal Problem:   Pneumonia due to COVID-19 virus Active Problems:   Acute respiratory failure with hypoxia (HCC)   Recurrent pulmonary emboli (HCC)   Spinal stenosis, lumbar region, with neurogenic claudication    Body mass index is 35.93 kg/m.  (Obesity)   COVID-19 pneumonia: Continue IV dexamethasone.  Added baricitinib.  Continue empiric IV ceftriaxone and azithromycin.   Acute hypoxic respiratory failure: She is requiring 15 L/min oxygen via nonrebreather mask.  Taper down oxygen as able.   History of DVT and recurrent pulmonary embolism: Continue Eliquis   Management plan discussed with the patient.  Risks and benefits of medications discussed and she is agreeable with the plan.   Diet Order             Diet heart healthy/carb modified Room service appropriate? Yes; Fluid consistency: Thin  Diet effective now                             Consultants: None  Procedures: None    Medications:    apixaban  5 mg Oral BID   baricitinib  4 mg Oral Daily   dexamethasone (DECADRON) injection  6 mg Intravenous Q24H   multivitamin with minerals  1 tablet Oral Daily   polyethylene glycol  17 g Oral Daily   sodium chloride flush  3 mL Intravenous Q12H   Continuous Infusions:  azithromycin Stopped (12/05/22 2118)   cefTRIAXone (ROCEPHIN)  IV Stopped (12/05/22 2011)     Anti-infectives (From admission, onward)    Start     Dose/Rate Route Frequency Ordered Stop   12/06/22 1500  azithromycin (ZITHROMAX) 500 mg in sodium chloride 0.9 % 250 mL IVPB  Status:  Discontinued        500 mg 250 mL/hr over 60 Minutes Intravenous Every 24 hours 12/05/22 1713 12/05/22 1849   12/05/22 1848  azithromycin (ZITHROMAX) 500 mg in sodium chloride 0.9 % 250 mL IVPB       Note to Pharmacy: Pt never got the Levofloxacin, I turned the pump off.   500 mg 250 mL/hr over 60 Minutes Intravenous Every 24 hours 12/05/22 1849 12/10/22 1859   12/05/22 1715  cefTRIAXone (ROCEPHIN) 2 g in sodium chloride 0.9 % 100 mL IVPB        2 g 200 mL/hr over 30 Minutes Intravenous Every 24 hours 12/05/22 1713 12/10/22 1714   12/05/22 1430  levofloxacin (LEVAQUIN) IVPB 750 mg  Status:  Discontinued        750 mg 100 mL/hr over 90 Minutes Intravenous  Once 12/05/22 1421 12/05/22 1632              Family Communication/Anticipated D/C date and plan/Code Status   DVT prophylaxis:  apixaban (ELIQUIS) tablet 5 mg     Code Status: Full Code  Family Communication: None Disposition Plan: Plan to discharge home with 3 to 5 days   Status is: Inpatient Remains inpatient appropriate because: Severe hypoxia       Subjective:   Interval events noted.  She complains of cough and shortness of breath  Objective:    Vitals:   12/06/22 0718 12/06/22 0730 12/06/22 0800 12/06/22 0830  BP: (!) 156/72 (!) 165/83 97/84 138/87   Pulse: 75 83 72 100  Resp: 20 (!) 27 (!) 21 (!) 31  Temp: (!) 97.5 F (36.4 C)     TempSrc: Oral     SpO2: 93% (!) 87% (!) 88% (!) 82%  Weight:      Height:       No data found.   Intake/Output Summary (Last 24 hours) at 12/06/2022 1104 Last data filed at 12/06/2022 0719 Gross per 24 hour  Intake 999 ml  Output --  Net 999 ml   Filed Weights   12/05/22 1412  Weight: 107.2 kg    Exam:   GEN: NAD SKIN: Warm and dry EYES: No pallor or icterus ENT: MMM CV: RRR PULM: On oxygen via nonbreathing mask, bibasilar rales, no wheezing ABD: soft, obese, NT, +BS CNS: AAO x 3, non focal EXT: No edema or tenderness        Data Reviewed:   I have personally reviewed following labs and imaging studies:  Labs: Labs show the following:   Basic Metabolic Panel: Recent Labs  Lab 12/05/22 1349 12/06/22 0540  NA 140 142  K 3.4* 4.1  CL 104 109  CO2 23 26  GLUCOSE 134* 157*  BUN 20 19  CREATININE 0.99 0.70  CALCIUM 9.7 9.7   GFR Estimated Creatinine Clearance: 74.3 mL/min (by C-G formula based on SCr of 0.7 mg/dL). Liver Function Tests: Recent Labs  Lab 12/05/22 1349 12/06/22 0540  AST 29 19  ALT 20 15  ALKPHOS 110 88  BILITOT 1.4* 0.8  PROT 7.9 6.8  ALBUMIN 2.8* 2.4*   No results for input(s): "LIPASE", "AMYLASE" in the last 168 hours. No results for input(s): "AMMONIA" in the last 168 hours. Coagulation profile No results for input(s): "INR", "PROTIME" in the last 168 hours.  CBC: Recent Labs  Lab 12/05/22 1349 12/06/22 0540  WBC 14.5* 7.8  NEUTROABS 10.1* 6.1  HGB 11.8* 10.3*  HCT 38.3 33.1*  MCV 82.2 81.5  PLT 396 351   Cardiac Enzymes: No results for input(s): "CKTOTAL", "CKMB", "CKMBINDEX", "TROPONINI" in the last 168 hours. BNP (last 3 results) No results for input(s): "PROBNP" in the last 8760 hours. CBG: No results for input(s): "GLUCAP" in the last 168 hours. D-Dimer: No results for input(s): "DDIMER" in the last 72 hours. Hgb  A1c: No results for input(s): "HGBA1C" in the last 72 hours. Lipid Profile: No results for input(s): "CHOL", "HDL", "LDLCALC", "TRIG", "CHOLHDL", "LDLDIRECT" in the last 72 hours. Thyroid function studies: No results for input(s): "TSH", "T4TOTAL", "T3FREE", "THYROIDAB" in the last 72 hours.  Invalid input(s): "FREET3" Anemia work up: No results for input(s): "VITAMINB12", "FOLATE", "FERRITIN", "TIBC", "IRON", "RETICCTPCT" in the last 72 hours. Sepsis Labs: Recent Labs  Lab 12/05/22  1349 12/06/22 0540  PROCALCITON 0.38  --   WBC 14.5* 7.8    Microbiology Recent Results (from the past 240 hour(s))  Resp panel by RT-PCR (RSV, Flu A&B, Covid) Anterior Nasal Swab     Status: Abnormal   Collection Time: 12/05/22  1:42 PM   Specimen: Anterior Nasal Swab  Result Value Ref Range Status   SARS Coronavirus 2 by RT PCR POSITIVE (A) NEGATIVE Final    Comment: (NOTE) SARS-CoV-2 target nucleic acids are DETECTED.  The SARS-CoV-2 RNA is generally detectable in upper respiratory specimens during the acute phase of infection. Positive results are indicative of the presence of the identified virus, but do not rule out bacterial infection or co-infection with other pathogens not detected by the test. Clinical correlation with patient history and other diagnostic information is necessary to determine patient infection status. The expected result is Negative.  Fact Sheet for Patients: EntrepreneurPulse.com.au  Fact Sheet for Healthcare Providers: IncredibleEmployment.be  This test is not yet approved or cleared by the [place] FDA and  has been authorized for detection and/or diagnosis of SARS-CoV-2 by FDA under an Emergency Use Authorization (EUA).  This EUA will remain in effect (meaning this test can be used) for the duration of  the COVID-19 declaration under Section 564(b)(1) of the A ct, 21 U.S.C. section 360bbb-3(b)(1), unless the  authorization is terminated or revoked sooner.     Influenza A by PCR NEGATIVE NEGATIVE Final   Influenza B by PCR NEGATIVE NEGATIVE Final    Comment: (NOTE) The Xpert Xpress SARS-CoV-2/FLU/RSV plus assay is intended as an aid in the diagnosis of influenza from Nasopharyngeal swab specimens and should not be used as a sole basis for treatment. Nasal washings and aspirates are unacceptable for Xpert Xpress SARS-CoV-2/FLU/RSV testing.  Fact Sheet for Patients: EntrepreneurPulse.com.au  Fact Sheet for Healthcare Providers: IncredibleEmployment.be  This test is not yet approved or cleared by the [place] FDA and has been authorized for detection and/or diagnosis of SARS-CoV-2 by FDA under an Emergency Use Authorization (EUA). This EUA will remain in effect (meaning this test can be used) for the duration of the COVID-19 declaration under Section 564(b)(1) of the Act, 21 U.S.C. section 360bbb-3(b)(1), unless the authorization is terminated or revoked.     Resp Syncytial Virus by PCR NEGATIVE NEGATIVE Final    Comment: (NOTE) Fact Sheet for Patients: EntrepreneurPulse.com.au  Fact Sheet for Healthcare Providers: IncredibleEmployment.be  This test is not yet approved or cleared by the [place] FDA and has been authorized for detection and/or diagnosis of SARS-CoV-2 by FDA under an Emergency Use Authorization (EUA). This EUA will remain in effect (meaning this test can be used) for the duration of the COVID-19 declaration under Section 564(b)(1) of the Act, 21 U.S.C. section 360bbb-3(b)(1), unless the authorization is terminated or revoked.  Performed at [facility], [address], [place],  [address]     Procedures and diagnostic studies:  DG Chest [facility] 1 View  Result Date: 12/05/2022 CLINICAL DATA:  Shortness of breath with cough for 2 weeks. EXAM: PORTABLE CHEST 1 VIEW  COMPARISON:  Radiographs 06/15/2018 and 02/08/2016.  CT 07/02/2022. FINDINGS: 1349 hours. The heart size and mediastinal contours are stable with a moderate-sized hiatal hernia. There are new patchy perihilar and lower lobe airspace opacities bilaterally suspicious for multilobar pneumonia. No pleural effusion or significant pleural effusion is identified. No acute osseous findings are demonstrated. There is a mild thoracolumbar scoliosis. Telemetry leads overlie the chest. IMPRESSION: New patchy  perihilar and lower lobe airspace opacities bilaterally suspicious for multilobar pneumonia. Radiographic follow-up recommended to document clearing and exclude alternative etiologies. Electronically Signed   By: Richardean Sale M.D.   On: 12/05/2022 13:56               LOS: 1 day   Tyshana Nishida  [facility]   Pager on [email or web address]. If 7PM-7AM, please contact night-coverage at [email or web address]     12/06/2022, 11:04 AM

## 2022-12-06 NOTE — ED Notes (Signed)
Pt changed into new brief and new bed chuck placed.

## 2022-12-07 DIAGNOSIS — J1282 Pneumonia due to coronavirus disease 2019: Secondary | ICD-10-CM | POA: Diagnosis not present

## 2022-12-07 DIAGNOSIS — U071 COVID-19: Secondary | ICD-10-CM | POA: Diagnosis not present

## 2022-12-07 DIAGNOSIS — J9601 Acute respiratory failure with hypoxia: Secondary | ICD-10-CM | POA: Diagnosis not present

## 2022-12-07 DIAGNOSIS — I2699 Other pulmonary embolism without acute cor pulmonale: Secondary | ICD-10-CM | POA: Diagnosis not present

## 2022-12-07 LAB — COMPREHENSIVE METABOLIC PANEL
ALT: 19 U/L (ref 0–44)
AST: 25 U/L (ref 15–41)
Albumin: 2.5 g/dL — ABNORMAL LOW (ref 3.5–5.0)
Alkaline Phosphatase: 83 U/L (ref 38–126)
Anion gap: 7 (ref 5–15)
BUN: 28 mg/dL — ABNORMAL HIGH (ref 8–23)
CO2: 27 mmol/L (ref 22–32)
Calcium: 9.9 mg/dL (ref 8.9–10.3)
Chloride: 109 mmol/L (ref 98–111)
Creatinine, Ser: 0.89 mg/dL (ref 0.44–1.00)
GFR, Estimated: >60 mL/min (ref >60)
Glucose, Bld: 130 mg/dL — ABNORMAL HIGH (ref 70–99)
Potassium: 4.1 mmol/L (ref 3.5–5.1)
Sodium: 143 mmol/L (ref 135–145)
Total Bilirubin: 0.7 mg/dL (ref 0.3–1.2)
Total Protein: 6.9 g/dL (ref 6.5–8.1)

## 2022-12-07 LAB — CBC WITH DIFFERENTIAL/PLATELET
Abs Immature Granulocytes: 0.45 10*3/uL — ABNORMAL HIGH (ref 0.00–0.07)
Basophils Absolute: 0.1 10*3/uL (ref 0.0–0.1)
Basophils Relative: 0 %
Eosinophils Absolute: 0 10*3/uL (ref 0.0–0.5)
Eosinophils Relative: 0 %
HCT: 35.9 % — ABNORMAL LOW (ref 36.0–46.0)
Hemoglobin: 11.2 g/dL — ABNORMAL LOW (ref 12.0–15.0)
Immature Granulocytes: 3 %
Lymphocytes Relative: 7 %
Lymphs Abs: 1.2 10*3/uL (ref 0.7–4.0)
MCH: 25.6 pg — ABNORMAL LOW (ref 26.0–34.0)
MCHC: 31.2 g/dL (ref 30.0–36.0)
MCV: 82 fL (ref 80.0–100.0)
Monocytes Absolute: 1.3 10*3/uL — ABNORMAL HIGH (ref 0.1–1.0)
Monocytes Relative: 7 %
Neutro Abs: 13.9 10*3/uL — ABNORMAL HIGH (ref 1.7–7.7)
Neutrophils Relative %: 83 %
Platelets: 394 10*3/uL (ref 150–400)
RBC: 4.38 MIL/uL (ref 3.87–5.11)
RDW: 14.4 % (ref 11.5–15.5)
WBC: 16.9 10*3/uL — ABNORMAL HIGH (ref 4.0–10.5)
nRBC: 0 % (ref 0.0–0.2)

## 2022-12-07 LAB — C-REACTIVE PROTEIN: CRP: 5.4 mg/dL — ABNORMAL HIGH (ref <1.0)

## 2022-12-07 MED ORDER — ORAL CARE MOUTH RINSE: 15.0000 mL | OROMUCOSAL | Status: DC | PRN

## 2022-12-07 NOTE — Progress Notes (Signed)
Progress Note    Alice Donaldson  RCV:[id number] DOB: 05/21/1946  DOA: 12/05/2022 PCP: [name], MD      Brief Narrative:    Medical records reviewed and are as summarized below:  Alice Donaldson is a 76 y.o. 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  Symptoms started about 11 days prior to admission and progressively worsened.  She developed rigors with drenching sweats about 3 days prior to admission.  She went to her PCPs office because of worsening symptoms.  Her oxygen saturation was low and was requiring 4 L/min oxygen.  Subsequently, she was referred to the emergency department for further management.  She tested positive for COVID infection.  She was found to have [id number] pneumonia complicated by acute hypoxic respiratory failure.         Assessment/Plan:   Principal Problem:   Pneumonia due to COVID-19 virus Active Problems:   Acute respiratory failure with hypoxia (HCC)   Recurrent pulmonary emboli (HCC)   Spinal stenosis, lumbar region, with neurogenic claudication    Body mass index is 35.12 kg/m.  (Obesity)   COVID-19 pneumonia: Continue IV dexamethasone and baricitinib.  Continue empiric IV ceftriaxone and azithromycin.     Acute hypoxic respiratory failure: Worsening hypoxia.  Oxygen via heated humidified high flow nasal cannula has been ordered.  Taper down oxygen as able.  History of DVT and recurrent pulmonary embolism: Continue Eliquis  Management plan discussed with the patient.  Risks and benefits of medications discussed and she is agreeable with the plan.   Diet Order             Diet heart healthy/carb modified Room service appropriate? Yes; Fluid consistency: Thin  Diet effective now                             Consultants: None  Procedures: None    Medications:    apixaban  5 mg Oral BID   baricitinib  4 mg Oral Daily   dexamethasone (DECADRON) injection  6 mg Intravenous Q24H   feeding supplement  237 mL Oral BID BM   multivitamin with minerals  1 tablet Oral Daily   polyethylene glycol  17 g Oral Daily   sodium chloride flush  3 mL Intravenous Q12H   Continuous Infusions:  azithromycin Stopped (12/06/22 2041)   cefTRIAXone (ROCEPHIN)  IV Stopped (12/06/22 1852)     Anti-infectives (From admission, onward)    Start     Dose/Rate Route Frequency Ordered Stop   12/06/22 1500  azithromycin (ZITHROMAX) 500 mg in sodium chloride 0.9 % 250 mL IVPB  Status:  Discontinued        500 mg 250 mL/hr over 60 Minutes Intravenous Every 24 hours 12/05/22 1713 12/05/22 1849   12/05/22 1848  azithromycin (ZITHROMAX) 500 mg in sodium chloride 0.9 % 250 mL IVPB       Note to Pharmacy: Pt never got the Levofloxacin, I turned the pump off.   500 mg 250 mL/hr over 60 Minutes Intravenous Every 24 hours 12/05/22 1849 12/10/22 1859   12/05/22 1715  cefTRIAXone (ROCEPHIN) 2 g in sodium chloride 0.9 % 100 mL IVPB        2 g 200 mL/hr over 30 Minutes Intravenous Every 24 hours 12/05/22 1713 12/10/22  1714   12/05/22 1430  levofloxacin (LEVAQUIN) IVPB 750 mg  Status:  Discontinued        750 mg 100 mL/hr over 90 Minutes Intravenous  Once 12/05/22 1421 12/05/22 1632              Family Communication/Anticipated D/C date and plan/Code Status   DVT prophylaxis:  apixaban (ELIQUIS) tablet 5 mg     Code Status: Full Code  Family Communication: None Disposition Plan: Plan to discharge home with 3 to 5 days   Status is: Inpatient Remains inpatient appropriate because: Severe hypoxia       Subjective:   Interval events noted.  Rapid response was called this morning because oxygen saturation dropped to 82 to 84% on 15 L/min oxygen via high flow nasal cannula.   However, patient says she feels comfortable at rest.  She complains of cough but no shortness of breath or chest pain.  Objective:    Vitals:   12/07/22 0801 12/07/22 0839 12/07/22 1000 12/07/22 1159  BP: (!) 168/81  (!) 145/79 (!) 140/82  Pulse:    64  Resp:      Temp: 98.4 F (36.9 C)  98.4 F (36.9 C) 98 F (36.7 C)  TempSrc: Oral  Oral Oral  SpO2: 90% 99%  94%  Weight:      Height:       No data found.   Intake/Output Summary (Last 24 hours) at 12/07/2022 1235 Last data filed at 12/07/2022 3825 Gross per 24 hour  Intake --  Output 150 ml  Net -150 ml   Filed Weights   12/05/22 1412 12/07/22 0625  Weight: 107.2 kg 104.8 kg    Exam:  GEN: NAD SKIN: Warm and dry EYES: No pallor or icterus ENT: MMM CV: RRR PULM: She was on oxygen via nonrebreather mask ABD: soft, obese, NT, +BS CNS: AAO x 3, non focal EXT: No edema or tenderness      Data Reviewed:   I have personally reviewed following labs and imaging studies:  Labs: Labs show the following:   Basic Metabolic Panel: Recent Labs  Lab 12/05/22 1349 12/06/22 0540 12/07/22 0650  NA 140 142 143  K 3.4* 4.1 4.1  CL 104 109 109  CO2 '23 26 27  '$ GLUCOSE 134* 157* 130*  BUN 20 19 28*  CREATININE 0.99 0.70 0.89  CALCIUM 9.7 9.7 9.9   GFR Estimated Creatinine Clearance: 66 mL/min (by C-G formula based on SCr of 0.89 mg/dL). Liver Function Tests: Recent Labs  Lab 12/05/22 1349 12/06/22 0540 12/07/22 0650  AST '29 19 25  '$ ALT '20 15 19  '$ ALKPHOS 110 88 83  BILITOT 1.4* 0.8 0.7  PROT 7.9 6.8 6.9  ALBUMIN 2.8* 2.4* 2.5*   No results for input(s): "LIPASE", "AMYLASE" in the last 168 hours. No results for input(s): "AMMONIA" in the last 168 hours. Coagulation profile No results for input(s): "INR", "PROTIME" in the last 168 hours.  CBC: Recent Labs  Lab 12/05/22 1349 12/06/22 0540 12/07/22 0650  WBC 14.5* 7.8 16.9*  NEUTROABS 10.1* 6.1 13.9*  HGB 11.8* 10.3* 11.2*  HCT 38.3 33.1* 35.9*   MCV 82.2 81.5 82.0  PLT 396 351 394   Cardiac Enzymes: No results for input(s): "CKTOTAL", "CKMB", "CKMBINDEX", "TROPONINI" in the last 168 hours. BNP (last 3 results) No results for input(s): "PROBNP" in the last 8760 hours. CBG: No results for input(s): "GLUCAP" in the last 168 hours. D-Dimer: No results for input(s): "DDIMER" in the last  72 hours. Hgb A1c: No results for input(s): "HGBA1C" in the last 72 hours. Lipid Profile: No results for input(s): "CHOL", "HDL", "LDLCALC", "TRIG", "CHOLHDL", "LDLDIRECT" in the last 72 hours. Thyroid function studies: No results for input(s): "TSH", "T4TOTAL", "T3FREE", "THYROIDAB" in the last 72 hours.  Invalid input(s): "FREET3" Anemia work up: No results for input(s): "VITAMINB12", "FOLATE", "FERRITIN", "TIBC", "IRON", "RETICCTPCT" in the last 72 hours. Sepsis Labs: Recent Labs  Lab 12/05/22 1349 12/06/22 0540 12/07/22 0650  PROCALCITON 0.38  --   --   WBC 14.5* 7.8 16.9*    Microbiology Recent Results (from the past 240 hour(s))  Resp panel by RT-PCR (RSV, Flu A&B, Covid) Anterior Nasal Swab     Status: Abnormal   Collection Time: 12/05/22  1:42 PM   Specimen: Anterior Nasal Swab  Result Value Ref Range Status   SARS Coronavirus 2 by RT PCR POSITIVE (A) NEGATIVE Final    Comment: (NOTE) SARS-CoV-2 target nucleic acids are DETECTED.  The SARS-CoV-2 RNA is generally detectable in upper respiratory specimens during the acute phase of infection. Positive results are indicative of the presence of the identified virus, but do not rule out bacterial infection or co-infection with other pathogens not detected by the test. Clinical correlation with patient history and other diagnostic information is necessary to determine patient infection status. The expected result is Negative.  Fact Sheet for Patients: EntrepreneurPulse.com.au  Fact Sheet for Healthcare  Providers: IncredibleEmployment.be  This test is not yet approved or cleared by the [place] FDA and  has been authorized for detection and/or diagnosis of SARS-CoV-2 by FDA under an Emergency Use Authorization (EUA).  This EUA will remain in effect (meaning this test can be used) for the duration of  the COVID-19 declaration under Section 564(b)(1) of the A ct, 21 U.S.C. section 360bbb-3(b)(1), unless the authorization is terminated or revoked sooner.     Influenza A by PCR NEGATIVE NEGATIVE Final   Influenza B by PCR NEGATIVE NEGATIVE Final    Comment: (NOTE) The Xpert Xpress SARS-CoV-2/FLU/RSV plus assay is intended as an aid in the diagnosis of influenza from Nasopharyngeal swab specimens and should not be used as a sole basis for treatment. Nasal washings and aspirates are unacceptable for Xpert Xpress SARS-CoV-2/FLU/RSV testing.  Fact Sheet for Patients: EntrepreneurPulse.com.au  Fact Sheet for Healthcare Providers: IncredibleEmployment.be  This test is not yet approved or cleared by the [place] FDA and has been authorized for detection and/or diagnosis of SARS-CoV-2 by FDA under an Emergency Use Authorization (EUA). This EUA will remain in effect (meaning this test can be used) for the duration of the COVID-19 declaration under Section 564(b)(1) of the Act, 21 U.S.C. section 360bbb-3(b)(1), unless the authorization is terminated or revoked.     Resp Syncytial Virus by PCR NEGATIVE NEGATIVE Final    Comment: (NOTE) Fact Sheet for Patients: EntrepreneurPulse.com.au  Fact Sheet for Healthcare Providers: IncredibleEmployment.be  This test is not yet approved or cleared by the [place] FDA and has been authorized for detection and/or diagnosis of SARS-CoV-2 by FDA under an Emergency Use Authorization (EUA). This EUA will remain in effect (meaning this test can be  used) for the duration of the COVID-19 declaration under Section 564(b)(1) of the Act, 21 U.S.C. section 360bbb-3(b)(1), unless the authorization is terminated or revoked.  Performed at [facility], [address] [place], [place] [address]     Procedures and diagnostic studies:  DG Chest [facility] 1 View  Result Date: 12/05/2022 CLINICAL DATA:  Shortness of breath with cough for 2 weeks. EXAM: PORTABLE CHEST 1 VIEW COMPARISON:  Radiographs 06/15/2018 and 02/08/2016.  CT 07/02/2022. FINDINGS: 1349 hours. The heart size and mediastinal contours are stable with a moderate-sized hiatal hernia. There are new patchy perihilar and lower lobe airspace opacities bilaterally suspicious for multilobar pneumonia. No pleural effusion or significant pleural effusion is identified. No acute osseous findings are demonstrated. There is a mild thoracolumbar scoliosis. Telemetry leads overlie the chest. IMPRESSION: New patchy perihilar and lower lobe airspace opacities bilaterally suspicious for multilobar pneumonia. Radiographic follow-up recommended to document clearing and exclude alternative etiologies. Electronically Signed   By: Richardean Sale M.D.   On: 12/05/2022 13:56               LOS: 2 days   Hawkin Charo  [facility]   Pager on [email or web address]. If 7PM-7AM, please contact night-coverage at [email or web address]     12/07/2022, 12:35 PM

## 2022-12-07 NOTE — Significant Event (Signed)
Rapid Response Event Note   Reason for Call : called RRT for decreased 02 stats   Initial Focused Assessment: Laying in bed, no distress noted, 02 sats 89% , see flowsheets for VS.      Interventions: Dr Mal Misty and respiratory to bedside, changed 02 orders to heated HFNC, bipap at bedside if needed.   Plan of Care: as above    Event Summary: charge RN, Janett Billow to call for further assistance.  MD Notified: Dr Mal Misty Call [place] NBVA:[id number] End DCVU:[id number]  Jazon Jipson A, RN

## 2022-12-07 NOTE — Progress Notes (Signed)
At 8am this morning, patient's o2 saturation was 82-84% on 15L HFNC. RT notified and presented to the bedside and Rapid response was called as well as provider. Patient placed on Heated High flow. Patient showing no signs of distress. O2 improved.

## 2022-12-08 DIAGNOSIS — J1282 Pneumonia due to coronavirus disease 2019: Secondary | ICD-10-CM | POA: Diagnosis not present

## 2022-12-08 DIAGNOSIS — I2699 Other pulmonary embolism without acute cor pulmonale: Secondary | ICD-10-CM | POA: Diagnosis not present

## 2022-12-08 DIAGNOSIS — J9601 Acute respiratory failure with hypoxia: Secondary | ICD-10-CM | POA: Diagnosis not present

## 2022-12-08 DIAGNOSIS — U071 COVID-19: Secondary | ICD-10-CM | POA: Diagnosis not present

## 2022-12-08 LAB — CBC WITH DIFFERENTIAL/PLATELET
Abs Immature Granulocytes: 0.32 10*3/uL — ABNORMAL HIGH (ref 0.00–0.07)
Basophils Absolute: 0 10*3/uL (ref 0.0–0.1)
Basophils Relative: 0 %
Eosinophils Absolute: 0 10*3/uL (ref 0.0–0.5)
Eosinophils Relative: 0 %
HCT: 34.1 % — ABNORMAL LOW (ref 36.0–46.0)
Hemoglobin: 10.7 g/dL — ABNORMAL LOW (ref 12.0–15.0)
Immature Granulocytes: 3 %
Lymphocytes Relative: 10 %
Lymphs Abs: 1.1 10*3/uL (ref 0.7–4.0)
MCH: 25.8 pg — ABNORMAL LOW (ref 26.0–34.0)
MCHC: 31.4 g/dL (ref 30.0–36.0)
MCV: 82.4 fL (ref 80.0–100.0)
Monocytes Absolute: 0.9 10*3/uL (ref 0.1–1.0)
Monocytes Relative: 8 %
Neutro Abs: 8.8 10*3/uL — ABNORMAL HIGH (ref 1.7–7.7)
Neutrophils Relative %: 79 %
Platelets: 369 10*3/uL (ref 150–400)
RBC: 4.14 MIL/uL (ref 3.87–5.11)
RDW: 14.3 % (ref 11.5–15.5)
WBC: 11.1 10*3/uL — ABNORMAL HIGH (ref 4.0–10.5)
nRBC: 0 % (ref 0.0–0.2)

## 2022-12-08 LAB — COMPREHENSIVE METABOLIC PANEL
ALT: 20 U/L (ref 0–44)
AST: 24 U/L (ref 15–41)
Albumin: 2.3 g/dL — ABNORMAL LOW (ref 3.5–5.0)
Alkaline Phosphatase: 72 U/L (ref 38–126)
Anion gap: 7 (ref 5–15)
BUN: 29 mg/dL — ABNORMAL HIGH (ref 8–23)
CO2: 26 mmol/L (ref 22–32)
Calcium: 9.4 mg/dL (ref 8.9–10.3)
Chloride: 111 mmol/L (ref 98–111)
Creatinine, Ser: 0.74 mg/dL (ref 0.44–1.00)
GFR, Estimated: >60 mL/min (ref >60)
Glucose, Bld: 120 mg/dL — ABNORMAL HIGH (ref 70–99)
Potassium: 4.5 mmol/L (ref 3.5–5.1)
Sodium: 144 mmol/L (ref 135–145)
Total Bilirubin: 0.6 mg/dL (ref 0.3–1.2)
Total Protein: 6.4 g/dL — ABNORMAL LOW (ref 6.5–8.1)

## 2022-12-08 LAB — C-REACTIVE PROTEIN: CRP: 1.9 mg/dL — ABNORMAL HIGH (ref <1.0)

## 2022-12-08 NOTE — Progress Notes (Signed)
Progress Note    Alice Donaldson  EXN:[id number] DOB: 08-Sep-1946  DOA: 12/05/2022 PCP: [name], MD      Brief Narrative:    Medical records reviewed and are as summarized below:  Alice Donaldson is a 76 y.o. 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  Symptoms started about 11 days prior to admission and progressively worsened.  She developed rigors with drenching sweats about 3 days prior to admission.  She went to her PCPs office because of worsening symptoms.  Her oxygen saturation was low and was requiring 4 L/min oxygen.  Subsequently, she was referred to the emergency department for further management.  She tested positive for COVID infection.  She was found to have WHQPR-[id number] pneumonia complicated by acute hypoxic respiratory failure.         Assessment/Plan:   Principal Problem:   Pneumonia due to COVID-19 virus Active Problems:   Acute respiratory failure with hypoxia (HCC)   Recurrent pulmonary emboli (HCC)   Spinal stenosis, lumbar region, with neurogenic claudication    Body mass index is 35.12 kg/m.  (Obesity)   COVID-19 pneumonia: Continue IV dexamethasone and baricitinib.  Continue empiric IV azithromycin and ceftriaxone.    Acute hypoxic respiratory failure: Continue oxygen via heated humidified high flow nasal cannula.  Taper down oxygen as able.     History of DVT and recurrent pulmonary embolism: Continue Eliquis    Diet Order             Diet heart healthy/carb modified Room service appropriate? Yes; Fluid consistency: Thin  Diet effective now                            Consultants: None  Procedures: None    Medications:    apixaban  5 mg Oral BID   baricitinib  4 mg Oral Daily   dexamethasone (DECADRON) injection   6 mg Intravenous Q24H   feeding supplement  237 mL Oral BID BM   multivitamin with minerals  1 tablet Oral Daily   polyethylene glycol  17 g Oral Daily   sodium chloride flush  3 mL Intravenous Q12H   Continuous Infusions:  azithromycin 500 mg (12/07/22 1806)   cefTRIAXone (ROCEPHIN)  IV Stopped (12/07/22 1707)     Anti-infectives (From admission, onward)    Start     Dose/Rate Route Frequency Ordered Stop   12/06/22 1500  azithromycin (ZITHROMAX) 500 mg in sodium chloride 0.9 % 250 mL IVPB  Status:  Discontinued        500 mg 250 mL/hr over 60 Minutes Intravenous Every 24 hours 12/05/22 1713 12/05/22 1849   12/05/22 1848  azithromycin (ZITHROMAX) 500 mg in sodium chloride 0.9 % 250 mL IVPB       Note to Pharmacy: Pt never got the Levofloxacin, I turned the pump off.   500 mg 250 mL/hr over 60 Minutes Intravenous Every 24 hours 12/05/22 1849 12/10/22 1859   12/05/22 1715  cefTRIAXone (ROCEPHIN) 2 g in sodium chloride 0.9 % 100 mL IVPB        2 g 200 mL/hr over 30 Minutes Intravenous Every 24 hours 12/05/22 1713 12/10/22 1714   12/05/22 1430  levofloxacin (LEVAQUIN) IVPB 750 mg  Status:  Discontinued  750 mg 100 mL/hr over 90 Minutes Intravenous  Once 12/05/22 1421 12/05/22 1632              Family Communication/Anticipated D/C date and plan/Code Status   DVT prophylaxis:  apixaban (ELIQUIS) tablet 5 mg     Code Status: Full Code  Family Communication: None Disposition Plan: Plan to discharge home with 3 to 5 days   Status is: Inpatient Remains inpatient appropriate because: Severe hypoxia       Subjective:   Interval events noted.  She complains of cough and congestion.  Breathing is a little better.  No other complaints.  Objective:    Vitals:   12/08/22 0419 12/08/22 0825 12/08/22 0931 12/08/22 0947  BP: (!) 162/85  (!) 147/81   Pulse: 66  63   Resp: 19     Temp: 97.9 F (36.6 C)  97.7 F (36.5 C)   TempSrc: Oral  Oral   SpO2: 96% 91%  (!) 87% 94%  Weight:      Height:       No data found.   Intake/Output Summary (Last 24 hours) at 12/08/2022 1218 Last data filed at 12/08/2022 0930 Gross per 24 hour  Intake 623.27 ml  Output 800 ml  Net -176.73 ml   Filed Weights   12/05/22 1412 12/07/22 0625  Weight: 107.2 kg 104.8 kg    Exam:   GEN: NAD SKIN: Warm and dry EYES: EOMI ENT: MMM CV: RRR PULM: Bibasilar rales, no wheezing ABD: soft, obese, NT, +BS CNS: AAO x 3, non focal EXT: No edema or tenderness   Data Reviewed:   I have personally reviewed following labs and imaging studies:  Labs: Labs show the following:   Basic Metabolic Panel: Recent Labs  Lab 12/05/22 1349 12/06/22 0540 12/07/22 0650 12/08/22 0456  NA 140 142 143 144  K 3.4* 4.1 4.1 4.5  CL 104 109 109 111  CO2 '23 26 27 26  '$ GLUCOSE 134* 157* 130* 120*  BUN 20 19 28* 29*  CREATININE 0.99 0.70 0.89 0.74  CALCIUM 9.7 9.7 9.9 9.4   GFR Estimated Creatinine Clearance: 73.5 mL/min (by C-G formula based on SCr of 0.74 mg/dL). Liver Function Tests: Recent Labs  Lab 12/05/22 1349 12/06/22 0540 12/07/22 0650 12/08/22 0456  AST '29 19 25 24  '$ ALT '20 15 19 20  '$ ALKPHOS 110 88 83 72  BILITOT 1.4* 0.8 0.7 0.6  PROT 7.9 6.8 6.9 6.4*  ALBUMIN 2.8* 2.4* 2.5* 2.3*   No results for input(s): "LIPASE", "AMYLASE" in the last 168 hours. No results for input(s): "AMMONIA" in the last 168 hours. Coagulation profile No results for input(s): "INR", "PROTIME" in the last 168 hours.  CBC: Recent Labs  Lab 12/05/22 1349 12/06/22 0540 12/07/22 0650 12/08/22 0456  WBC 14.5* 7.8 16.9* 11.1*  NEUTROABS 10.1* 6.1 13.9* 8.8*  HGB 11.8* 10.3* 11.2* 10.7*  HCT 38.3 33.1* 35.9* 34.1*  MCV 82.2 81.5 82.0 82.4  PLT 396 351 394 369   Cardiac Enzymes: No results for input(s): "CKTOTAL", "CKMB", "CKMBINDEX", "TROPONINI" in the last 168 hours. BNP (last 3 results) No results for input(s): "PROBNP" in the last 8760 hours. CBG: No results for  input(s): "GLUCAP" in the last 168 hours. D-Dimer: No results for input(s): "DDIMER" in the last 72 hours. Hgb A1c: No results for input(s): "HGBA1C" in the last 72 hours. Lipid Profile: No results for input(s): "CHOL", "HDL", "LDLCALC", "TRIG", "CHOLHDL", "LDLDIRECT" in the last 72 hours. Thyroid function studies: No  results for input(s): "TSH", "T4TOTAL", "T3FREE", "THYROIDAB" in the last 72 hours.  Invalid input(s): "FREET3" Anemia work up: No results for input(s): "VITAMINB12", "FOLATE", "FERRITIN", "TIBC", "IRON", "RETICCTPCT" in the last 72 hours. Sepsis Labs: Recent Labs  Lab 12/05/22 1349 12/06/22 0540 12/07/22 0650 12/08/22 0456  PROCALCITON 0.38  --   --   --   WBC 14.5* 7.8 16.9* 11.1*    Microbiology Recent Results (from the past 240 hour(s))  Resp panel by RT-PCR (RSV, Flu A&B, Covid) Anterior Nasal Swab     Status: Abnormal   Collection Time: 12/05/22  1:42 PM   Specimen: Anterior Nasal Swab  Result Value Ref Range Status   SARS Coronavirus 2 by RT PCR POSITIVE (A) NEGATIVE Final    Comment: (NOTE) SARS-CoV-2 target nucleic acids are DETECTED.  The SARS-CoV-2 RNA is generally detectable in upper respiratory specimens during the acute phase of infection. Positive results are indicative of the presence of the identified virus, but do not rule out bacterial infection or co-infection with other pathogens not detected by the test. Clinical correlation with patient history and other diagnostic information is necessary to determine patient infection status. The expected result is Negative.  Fact Sheet for Patients: EntrepreneurPulse.com.au  Fact Sheet for Healthcare Providers: IncredibleEmployment.be  This test is not yet approved or cleared by the [place] FDA and  has been authorized for detection and/or diagnosis of SARS-CoV-2 by FDA under an Emergency Use Authorization (EUA).  This EUA will remain in effect (meaning  this test can be used) for the duration of  the COVID-19 declaration under Section 564(b)(1) of the A ct, 21 U.S.C. section 360bbb-3(b)(1), unless the authorization is terminated or revoked sooner.     Influenza A by PCR NEGATIVE NEGATIVE Final   Influenza B by PCR NEGATIVE NEGATIVE Final    Comment: (NOTE) The Xpert Xpress SARS-CoV-2/FLU/RSV plus assay is intended as an aid in the diagnosis of influenza from Nasopharyngeal swab specimens and should not be used as a sole basis for treatment. Nasal washings and aspirates are unacceptable for Xpert Xpress SARS-CoV-2/FLU/RSV testing.  Fact Sheet for Patients: EntrepreneurPulse.com.au  Fact Sheet for Healthcare Providers: IncredibleEmployment.be  This test is not yet approved or cleared by the [place] FDA and has been authorized for detection and/or diagnosis of SARS-CoV-2 by FDA under an Emergency Use Authorization (EUA). This EUA will remain in effect (meaning this test can be used) for the duration of the COVID-19 declaration under Section 564(b)(1) of the Act, 21 U.S.C. section 360bbb-3(b)(1), unless the authorization is terminated or revoked.     Resp Syncytial Virus by PCR NEGATIVE NEGATIVE Final    Comment: (NOTE) Fact Sheet for Patients: EntrepreneurPulse.com.au  Fact Sheet for Healthcare Providers: IncredibleEmployment.be  This test is not yet approved or cleared by the [place] FDA and has been authorized for detection and/or diagnosis of SARS-CoV-2 by FDA under an Emergency Use Authorization (EUA). This EUA will remain in effect (meaning this test can be used) for the duration of the COVID-19 declaration under Section 564(b)(1) of the Act, 21 U.S.C. section 360bbb-3(b)(1), unless the authorization is terminated or revoked.  Performed at [facility], [address] [place], [place] [address]     Procedures and  diagnostic studies:  No results found.             LOS: 3 days   Corban Kistler  [facility]   Pager on [email or web address]. If 7PM-7AM, please contact night-coverage at [email or web address]  12/08/2022, 12:18 PM

## 2022-12-09 ENCOUNTER — Ambulatory Visit: Payer: PPO | Admitting: Internal Medicine

## 2022-12-09 DIAGNOSIS — U071 COVID-19: Secondary | ICD-10-CM | POA: Diagnosis not present

## 2022-12-09 DIAGNOSIS — J1282 Pneumonia due to coronavirus disease 2019: Secondary | ICD-10-CM | POA: Diagnosis not present

## 2022-12-09 DIAGNOSIS — J9601 Acute respiratory failure with hypoxia: Secondary | ICD-10-CM | POA: Diagnosis not present

## 2022-12-09 DIAGNOSIS — I2699 Other pulmonary embolism without acute cor pulmonale: Secondary | ICD-10-CM | POA: Diagnosis not present

## 2022-12-09 LAB — CBC WITH DIFFERENTIAL/PLATELET
Abs Immature Granulocytes: 0.42 10*3/uL — ABNORMAL HIGH (ref 0.00–0.07)
Basophils Absolute: 0 10*3/uL (ref 0.0–0.1)
Basophils Relative: 0 %
Eosinophils Absolute: 0 10*3/uL (ref 0.0–0.5)
Eosinophils Relative: 0 %
HCT: 35.6 % — ABNORMAL LOW (ref 36.0–46.0)
Hemoglobin: 11.2 g/dL — ABNORMAL LOW (ref 12.0–15.0)
Immature Granulocytes: 4 %
Lymphocytes Relative: 10 %
Lymphs Abs: 1 10*3/uL (ref 0.7–4.0)
MCH: 25.6 pg — ABNORMAL LOW (ref 26.0–34.0)
MCHC: 31.5 g/dL (ref 30.0–36.0)
MCV: 81.5 fL (ref 80.0–100.0)
Monocytes Absolute: 0.8 10*3/uL (ref 0.1–1.0)
Monocytes Relative: 8 %
Neutro Abs: 7.5 10*3/uL (ref 1.7–7.7)
Neutrophils Relative %: 78 %
Platelets: 381 10*3/uL (ref 150–400)
RBC: 4.37 MIL/uL (ref 3.87–5.11)
RDW: 14 % (ref 11.5–15.5)
WBC: 9.8 10*3/uL (ref 4.0–10.5)
nRBC: 0 % (ref 0.0–0.2)

## 2022-12-09 LAB — COMPREHENSIVE METABOLIC PANEL
ALT: 19 U/L (ref 0–44)
AST: 20 U/L (ref 15–41)
Albumin: 2.4 g/dL — ABNORMAL LOW (ref 3.5–5.0)
Alkaline Phosphatase: 67 U/L (ref 38–126)
Anion gap: 8 (ref 5–15)
BUN: 27 mg/dL — ABNORMAL HIGH (ref 8–23)
CO2: 24 mmol/L (ref 22–32)
Calcium: 9.4 mg/dL (ref 8.9–10.3)
Chloride: 107 mmol/L (ref 98–111)
Creatinine, Ser: 0.6 mg/dL (ref 0.44–1.00)
GFR, Estimated: >60 mL/min (ref >60)
Glucose, Bld: 106 mg/dL — ABNORMAL HIGH (ref 70–99)
Potassium: 4.6 mmol/L (ref 3.5–5.1)
Sodium: 139 mmol/L (ref 135–145)
Total Bilirubin: 0.6 mg/dL (ref 0.3–1.2)
Total Protein: 6.4 g/dL — ABNORMAL LOW (ref 6.5–8.1)

## 2022-12-09 LAB — C-REACTIVE PROTEIN: CRP: 0.8 mg/dL (ref <1.0)

## 2022-12-09 MED ORDER — AZITHROMYCIN 250 MG PO TABS
500.0000 mg | ORAL_TABLET | Freq: Every day | ORAL | Status: AC
  Administered 2022-12-09: 500 mg via ORAL
  Filled 2022-12-09: qty 2

## 2022-12-09 MED ORDER — PANTOPRAZOLE SODIUM 40 MG PO TBEC
40.0000 mg | DELAYED_RELEASE_TABLET | Freq: Two times a day (BID) | ORAL | Status: DC
  Administered 2022-12-09 – 2022-12-16 (×15): 40 mg via ORAL
  Filled 2022-12-09 (×15): qty 1

## 2022-12-09 MED ORDER — TIZANIDINE HCL 2 MG PO TABS
2.0000 mg | ORAL_TABLET | Freq: Every evening | ORAL | Status: DC | PRN
  Administered 2022-12-09: 2 mg via ORAL
  Filled 2022-12-09 (×2): qty 1

## 2022-12-09 NOTE — Progress Notes (Signed)
Progress Note    Alice Donaldson  QIH:[id number] DOB: 08/18/1946  DOA: 12/05/2022 PCP: [name], MD      Brief Narrative:    Medical records reviewed and are as summarized below:  Alice Donaldson is a 76 y.o. 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  Symptoms started about 11 days prior to admission and progressively worsened.  She developed rigors with drenching sweats about 3 days prior to admission.  She went to her PCPs office because of worsening symptoms.  Her oxygen saturation was low and was requiring 4 L/min oxygen.  Subsequently, she was referred to the emergency department for further management.  She tested positive for COVID infection.  She was found to have OVFIE-33 pneumonia complicated by acute hypoxic respiratory failure.         Assessment/Plan:   Principal Problem:   Pneumonia due to COVID-19 virus Active Problems:   Acute respiratory failure with hypoxia (HCC)   Recurrent pulmonary emboli (HCC)   Spinal stenosis, lumbar region, with neurogenic claudication    Body mass index is 35.12 kg/m.  (Obesity)   COVID-19 pneumonia: Continue IV dexamethasone and baricitinib.  Completed IV azithromycin this morning.  1 dose of IV ceftriaxone left.    Acute hypoxic respiratory failure: Continue oxygen via heated humidified high flow nasal cannula, FiO2 65% at 60 L/min  History of DVT and recurrent pulmonary embolism: Continue Eliquis    Diet Order             Diet heart healthy/carb modified Room service appropriate? Yes; Fluid consistency: Thin  Diet effective now                            Consultants: None  Procedures: None    Medications:    apixaban  5 mg Oral BID   azithromycin  500 mg Oral Daily   baricitinib  4 mg Oral  Daily   dexamethasone (DECADRON) injection  6 mg Intravenous Q24H   feeding supplement  237 mL Oral BID BM   multivitamin with minerals  1 tablet Oral Daily   pantoprazole  40 mg Oral BID   polyethylene glycol  17 g Oral Daily   sodium chloride flush  3 mL Intravenous Q12H   Continuous Infusions:  cefTRIAXone (ROCEPHIN)  IV Stopped (12/08/22 1701)     Anti-infectives (From admission, onward)    Start     Dose/Rate Route Frequency Ordered Stop   12/09/22 1800  azithromycin (ZITHROMAX) tablet 500 mg        500 mg Oral Daily 12/09/22 1047 12/10/22 0959   12/06/22 1500  azithromycin (ZITHROMAX) 500 mg in sodium chloride 0.9 % 250 mL IVPB  Status:  Discontinued        500 mg 250 mL/hr over 60 Minutes Intravenous Every 24 hours 12/05/22 1713 12/05/22 1849   12/05/22 1848  azithromycin (ZITHROMAX) 500 mg in sodium chloride 0.9 % 250 mL IVPB  Status:  Discontinued       Note to Pharmacy: Pt never got the Levofloxacin, I turned the pump off.   500 mg 250 mL/hr over 60 Minutes Intravenous Every 24 hours 12/05/22 1849 12/09/22 1046   12/05/22 1715  cefTRIAXone (ROCEPHIN) 2 g in sodium chloride 0.9 % 100 mL IVPB  2 g 200 mL/hr over 30 Minutes Intravenous Every 24 hours 12/05/22 1713 12/10/22 1714   12/05/22 1430  levofloxacin (LEVAQUIN) IVPB 750 mg  Status:  Discontinued        750 mg 100 mL/hr over 90 Minutes Intravenous  Once 12/05/22 1421 12/05/22 1632              Family Communication/Anticipated D/C date and plan/Code Status   DVT prophylaxis:  apixaban (ELIQUIS) tablet 5 mg     Code Status: Full Code  Family Communication: None Disposition Plan: Plan to discharge home with 3 to 5 days   Status is: Inpatient Remains inpatient appropriate because: Severe hypoxia       Subjective:   She complains of cough and congestion.  She feels comfortable at rest.  She reported oxygen saturation drops into the 80s when she tries to sit up in bed or with little  activity.  Objective:    Vitals:   12/09/22 0303 12/09/22 0545 12/09/22 0813 12/09/22 1205  BP:  137/84 138/71 (!) 124/59  Pulse:  60 (!) 56 (!) 59  Resp:  '16 20 13  '$ Temp:  98.3 F (36.8 C) 97.7 F (36.5 C) 97.7 F (36.5 C)  TempSrc:   Axillary Oral  SpO2: 94% 96% 95% 94%  Weight:      Height:       No data found.   Intake/Output Summary (Last 24 hours) at 12/09/2022 1354 Last data filed at 12/09/2022 1216 Gross per 24 hour  Intake 3 ml  Output 850 ml  Net -847 ml   Filed Weights   12/05/22 1412 12/07/22 0625  Weight: 107.2 kg 104.8 kg    Exam:  GEN: NAD SKIN: Warm and dry EYES: Anicteric ENT: MMM CV: RRR PULM: Bibasilar rales, no wheezing ABD: soft, obese, NT, +BS CNS: AAO x 3, non focal EXT: No edema or tenderness    Data Reviewed:   I have personally reviewed following labs and imaging studies:  Labs: Labs show the following:   Basic Metabolic Panel: Recent Labs  Lab 12/05/22 1349 12/06/22 0540 12/07/22 0650 12/08/22 0456 12/09/22 0632  NA 140 142 143 144 139  K 3.4* 4.1 4.1 4.5 4.6  CL 104 109 109 111 107  CO2 '23 26 27 26 24  '$ GLUCOSE 134* 157* 130* 120* 106*  BUN 20 19 28* 29* 27*  CREATININE 0.99 0.70 0.89 0.74 0.60  CALCIUM 9.7 9.7 9.9 9.4 9.4   GFR Estimated Creatinine Clearance: 73.5 mL/min (by C-G formula based on SCr of 0.6 mg/dL). Liver Function Tests: Recent Labs  Lab 12/05/22 1349 12/06/22 0540 12/07/22 0650 12/08/22 0456 12/09/22 0632  AST '29 19 25 24 20  '$ ALT '20 15 19 20 19  '$ ALKPHOS 110 88 83 72 67  BILITOT 1.4* 0.8 0.7 0.6 0.6  PROT 7.9 6.8 6.9 6.4* 6.4*  ALBUMIN 2.8* 2.4* 2.5* 2.3* 2.4*   No results for input(s): "LIPASE", "AMYLASE" in the last 168 hours. No results for input(s): "AMMONIA" in the last 168 hours. Coagulation profile No results for input(s): "INR", "PROTIME" in the last 168 hours.  CBC: Recent Labs  Lab 12/05/22 1349 12/06/22 0540 12/07/22 0650 12/08/22 0456 12/09/22 0632  WBC 14.5*  7.8 16.9* 11.1* 9.8  NEUTROABS 10.1* 6.1 13.9* 8.8* 7.5  HGB 11.8* 10.3* 11.2* 10.7* 11.2*  HCT 38.3 33.1* 35.9* 34.1* 35.6*  MCV 82.2 81.5 82.0 82.4 81.5  PLT 396 351 394 369 381   Cardiac Enzymes: No results for input(s): "  CKTOTAL", "CKMB", "CKMBINDEX", "TROPONINI" in the last 168 hours. BNP (last 3 results) No results for input(s): "PROBNP" in the last 8760 hours. CBG: No results for input(s): "GLUCAP" in the last 168 hours. D-Dimer: No results for input(s): "DDIMER" in the last 72 hours. Hgb A1c: No results for input(s): "HGBA1C" in the last 72 hours. Lipid Profile: No results for input(s): "CHOL", "HDL", "LDLCALC", "TRIG", "CHOLHDL", "LDLDIRECT" in the last 72 hours. Thyroid function studies: No results for input(s): "TSH", "T4TOTAL", "T3FREE", "THYROIDAB" in the last 72 hours.  Invalid input(s): "FREET3" Anemia work up: No results for input(s): "VITAMINB12", "FOLATE", "FERRITIN", "TIBC", "IRON", "RETICCTPCT" in the last 72 hours. Sepsis Labs: Recent Labs  Lab 12/05/22 1349 12/06/22 0540 12/07/22 0650 12/08/22 0456 12/09/22 0632  PROCALCITON 0.38  --   --   --   --   WBC 14.5* 7.8 16.9* 11.1* 9.8    Microbiology Recent Results (from the past 240 hour(s))  Resp panel by RT-PCR (RSV, Flu A&B, Covid) Anterior Nasal Swab     Status: Abnormal   Collection Time: 12/05/22  1:42 PM   Specimen: Anterior Nasal Swab  Result Value Ref Range Status   SARS Coronavirus 2 by RT PCR POSITIVE (A) NEGATIVE Final    Comment: (NOTE) SARS-CoV-2 target nucleic acids are DETECTED.  The SARS-CoV-2 RNA is generally detectable in upper respiratory specimens during the acute phase of infection. Positive results are indicative of the presence of the identified virus, but do not rule out bacterial infection or co-infection with other pathogens not detected by the test. Clinical correlation with patient history and other diagnostic information is necessary to determine patient infection  status. The expected result is Negative.  Fact Sheet for Patients: EntrepreneurPulse.com.au  Fact Sheet for Healthcare Providers: IncredibleEmployment.be  This test is not yet approved or cleared by the [place] FDA and  has been authorized for detection and/or diagnosis of SARS-CoV-2 by FDA under an Emergency Use Authorization (EUA).  This EUA will remain in effect (meaning this test can be used) for the duration of  the COVID-19 declaration under Section 564(b)(1) of the A ct, 21 U.S.C. section 360bbb-3(b)(1), unless the authorization is terminated or revoked sooner.     Influenza A by PCR NEGATIVE NEGATIVE Final   Influenza B by PCR NEGATIVE NEGATIVE Final    Comment: (NOTE) The Xpert Xpress SARS-CoV-2/FLU/RSV plus assay is intended as an aid in the diagnosis of influenza from Nasopharyngeal swab specimens and should not be used as a sole basis for treatment. Nasal washings and aspirates are unacceptable for Xpert Xpress SARS-CoV-2/FLU/RSV testing.  Fact Sheet for Patients: EntrepreneurPulse.com.au  Fact Sheet for Healthcare Providers: IncredibleEmployment.be  This test is not yet approved or cleared by the [place] FDA and has been authorized for detection and/or diagnosis of SARS-CoV-2 by FDA under an Emergency Use Authorization (EUA). This EUA will remain in effect (meaning this test can be used) for the duration of the COVID-19 declaration under Section 564(b)(1) of the Act, 21 U.S.C. section 360bbb-3(b)(1), unless the authorization is terminated or revoked.     Resp Syncytial Virus by PCR NEGATIVE NEGATIVE Final    Comment: (NOTE) Fact Sheet for Patients: EntrepreneurPulse.com.au  Fact Sheet for Healthcare Providers: IncredibleEmployment.be  This test is not yet approved or cleared by the [place] FDA and has been authorized for detection  and/or diagnosis of SARS-CoV-2 by FDA under an Emergency Use Authorization (EUA). This EUA will remain in effect (meaning this test can be used) for the duration  of the COVID-19 declaration under Section 564(b)(1) of the Act, 21 U.S.C. section 360bbb-3(b)(1), unless the authorization is terminated or revoked.  Performed at [facility], [address] [place], [place] [address]     Procedures and diagnostic studies:  No results found.             LOS: 4 days   Ayana Imhof  [facility]   Pager on [email or web address]. If 7PM-7AM, please contact night-coverage at [email or web address]     12/09/2022, 1:54 PM

## 2022-12-09 NOTE — Progress Notes (Signed)
PHARMACIST - PHYSICIAN COMMUNICATION DR:   Mal Misty CONCERNING: Antibiotic IV to Oral Route Change Policy  RECOMMENDATION: This patient is receiving Azithromycin by the intravenous route.  Based on criteria approved by the Pharmacy and Therapeutics Committee, the antibiotic(s) is/are being converted to the equivalent oral dose form(s).   DESCRIPTION: These criteria include: Patient being treated for a respiratory tract infection, urinary tract infection, cellulitis or clostridium difficile associated diarrhea if on metronidazole The patient is not neutropenic and does not exhibit a GI malabsorption state The patient is eating (either orally or via tube) and/or has been taking other orally administered medications for a least 24 hours The patient is improving clinically and has a Tmax < 100.5  If you have questions about this conversion, please contact the Pharmacy Department   Adamarys Shall Rodriguez-Guzman PharmD, BCPS 12/09/2022 10:45 AM

## 2022-12-09 NOTE — Evaluation (Signed)
Occupational Therapy Evaluation Patient Details Name: Alice Donaldson MRN: [id number] DOB: 10-06-1946 Today's Date: 12/09/2022   History of Present Illness Alice Donaldson is a 76 y.o. 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 She was found to have OHYWV-37 pneumonia complicated by acute hypoxic respiratory failure.   Clinical Impression   Alice Donaldson was seen for OT evaluation this date. Prior to hospital admission, pt was IND for mobility and ADLs. Pt lives alone. Pt presents to acute OT demonstrating impaired ADL performance and functional mobility 2/2 decreased activity tolerance and functional strength/balance deficits. Pt currently requires SUPERVISION don B socks seated EOB. CGA + HHA bed>chair t/f, mild LOB self corrects. Educated on IS, importance of OOB daily, and HEP. Pt would benefit from skilled OT to address noted impairments and functional limitations (see below for any additional details). Upon hospital discharge, recommend HHOT to maximize pt safety and return to PLOF.   Recommendations for follow up therapy are one component of a multi-disciplinary discharge planning process, led by the attending physician.  Recommendations may be updated based on patient status, additional functional criteria and insurance authorization.   Follow Up Recommendations  Home health OT     Assistance Recommended at Discharge Frequent or constant Supervision/Assistance  Patient can return home with the following A little help with walking and/or transfers;A little help with bathing/dressing/bathroom    Functional Status Assessment  Patient has had a recent decline in their functional status and demonstrates the ability to make significant improvements in function in a reasonable and predictable amount of time.   Equipment Recommendations  BSC/3in1    Recommendations for Other Services       Precautions / Restrictions Precautions Precautions: Fall Restrictions Weight Bearing Restrictions: No      Mobility Bed Mobility Overal bed mobility: Modified Independent             General bed mobility comments: increased time    Transfers Overall transfer level: Needs assistance Equipment used: None Transfers: Sit to/from Stand, Bed to chair/wheelchair/BSC Sit to Stand: Min guard Stand pivot transfers: Min assist                Balance Overall balance assessment: Needs assistance Sitting-balance support: No upper extremity supported, Feet supported Sitting balance-Leahy Scale: Good     Standing balance support: No upper extremity supported Standing balance-Leahy Scale: Fair                             ADL either performed or assessed with clinical judgement   ADL Overall ADL's : Needs assistance/impaired                                       General ADL Comments: SUPERVISION don B socks seated EOB. CGA + HHA for BSC t/f.      Pertinent Vitals/Pain Pain Assessment Pain Assessment: No/denies pain     Hand Dominance     Extremity/Trunk Assessment Upper Extremity Assessment Upper Extremity Assessment: Overall WFL for tasks assessed   Lower Extremity Assessment Lower Extremity Assessment: Generalized weakness       Communication Communication Communication: No difficulties   Cognition Arousal/Alertness: Awake/alert Behavior During Therapy: WFL for tasks assessed/performed Overall Cognitive Status:  Within Functional Limits for tasks assessed                                        Home Living Family/patient expects to be discharged to:: Private residence Living Arrangements: Alone Available Help at Discharge: Family;Available PRN/intermittently Type of Home: House Home Access: Stairs to enter Entrance  Stairs-Number of Steps: 1+1 Entrance Stairs-Rails: Can reach both Home Layout: Able to live on main level with bedroom/bathroom               Home Equipment: Conservation officer, nature (2 wheels);Rollator (4 wheels);Hospital bed          Prior Functioning/Environment Prior Level of Function : Independent/Modified Independent             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          OT Problem List: Decreased strength;Decreased activity tolerance;Impaired balance (sitting and/or standing);Decreased safety awareness      OT Treatment/Interventions: Self-care/ADL training;Therapeutic exercise;Energy conservation;DME and/or AE instruction;Therapeutic activities;Patient/family education;Balance training    OT Goals(Current goals can be found in the care plan section) Acute Rehab OT Goals Patient Stated Goal: to go home OT Goal Formulation: With patient Time For Goal Achievement: 12/23/22 Potential to Achieve Goals: Good ADL Goals Pt Will Perform Grooming: with modified independence;standing Pt Will Perform Lower Body Dressing: Independently;sit to/from stand Pt Will Transfer to Toilet: with modified independence;ambulating;regular height toilet  OT Frequency: Min 3X/week    Co-evaluation PT/OT/SLP Co-Evaluation/Treatment: Yes Reason for Co-Treatment: For patient/therapist safety;To address functional/ADL transfers PT goals addressed during session: Mobility/safety with mobility OT goals addressed during session: ADL's and self-care      AM-PAC OT "6 Clicks" Daily Activity     Outcome Measure Help from another person eating meals?: None Help from another person taking care of personal grooming?: A Little Help from another person toileting, which includes using toliet, bedpan, or urinal?: A Little Help from another person bathing (including washing, rinsing, drying)?: A Little Help from another person to put on and taking off regular upper body  clothing?: None Help from another person to put on and taking off regular lower body clothing?: A Little 6 Click Score: 20   End of Session Nurse Communication: Mobility status  Activity Tolerance: Patient tolerated treatment well Patient left: in chair;with call bell/phone within reach  OT Visit Diagnosis: Other abnormalities of gait and mobility (R26.89);Muscle weakness (generalized) (M62.81)                Time: 2111-5520 OT Time Calculation (min): 28 min Charges:  OT General Charges $OT Visit: 1 Visit OT Evaluation $OT Eval Moderate Complexity: 1 Mod OT Treatments $Self Care/Home Management : 8-22 mins  Dessie Coma, M.S. OTR/L  12/09/22, 4:16 PM  ascom [phone]

## 2022-12-09 NOTE — Evaluation (Signed)
Physical Therapy Evaluation Patient Details Name: Alice Donaldson MRN: [id number] DOB: 07/03/1946 Today's Date: 12/09/2022  History of Present Illness  Alice Donaldson is a 76 y.o. 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 She was found to have VZCHY-85 pneumonia complicated by acute hypoxic respiratory failure.  Clinical Impression  Patient admitted with the above. 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 Patient presents with weakness, impaired balance, and decreased activity tolerance. Patient required min guard for sit to stand and minA to take steps over to chair with no AD. Educated patient on incentive spirometer, OOB for meals, and gentle HEP, patient verbalized understanding. Patient will benefit from skilled PT services during acute stay to address listed deficits. Recommend HHPT at discharge to maximize functional independence and safety.        Recommendations for follow up therapy are one component of a multi-disciplinary discharge planning process, led by the attending physician.  Recommendations may be updated based on patient status, additional functional criteria and insurance authorization.  Follow Up Recommendations Home health PT      Assistance Recommended at Discharge Frequent or constant Supervision/Assistance  Patient can return home with the following  A little help with walking and/or transfers;A little help with bathing/dressing/bathroom;Assistance with cooking/housework;Assist for transportation;Help with stairs or ramp for entrance    Equipment Recommendations BSC/3in1  Recommendations for Other Services       Functional Status Assessment Patient has had a recent decline in their functional status and  demonstrates the ability to make significant improvements in function in a reasonable and predictable amount of time.     Precautions / Restrictions Precautions Precautions: Fall Precaution Comments: watch O2 Restrictions Weight Bearing Restrictions: No      Mobility  Bed Mobility Overal bed mobility: Modified Independent             General bed mobility comments: increased time    Transfers Overall transfer level: Needs assistance Equipment used: None Transfers: Sit to/from Stand, Bed to chair/wheelchair/BSC Sit to Stand: Min guard   Step pivot transfers: Min assist            Ambulation/Gait                  Stairs            Wheelchair Mobility    Modified Rankin (Stroke Patients Only)       Balance Overall balance assessment: Needs assistance Sitting-balance support: No upper extremity supported, Feet supported Sitting balance-Leahy Scale: Good     Standing balance support: No upper extremity supported Standing balance-Leahy Scale: Fair                               Pertinent Vitals/Pain Pain Assessment Pain Assessment: No/denies pain    Home Living Family/patient expects to be discharged to:: Private residence Living Arrangements: Alone Available Help at Discharge: Family;Available PRN/intermittently Type of Home: House Home Access: Stairs to enter Entrance Stairs-Rails: Can reach both Entrance Stairs-Number of Steps: 1+1   Home Layout: Able to live on main level with bedroom/bathroom Home Equipment: Conservation officer, nature (2 wheels);Rollator (4 wheels);Hospital bed;Wheelchair - manual      Prior Function Prior Level of Function : Independent/Modified Independent  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       Hand Dominance        Extremity/Trunk Assessment   Upper Extremity Assessment Upper Extremity Assessment: Defer to OT evaluation    Lower Extremity  Assessment Lower Extremity Assessment: Generalized weakness       Communication   Communication: No difficulties  Cognition Arousal/Alertness: Awake/alert Behavior During Therapy: WFL for tasks assessed/performed Overall Cognitive Status: Within Functional Limits for tasks assessed                                          General Comments      Exercises     Assessment/Plan    PT Assessment Patient needs continued PT services  PT Problem List Decreased strength;Decreased activity tolerance;Decreased balance;Decreased mobility;Cardiopulmonary status limiting activity;Decreased safety awareness;Decreased knowledge of use of DME       PT Treatment Interventions DME instruction;Gait training;Functional mobility training;Therapeutic activities;Therapeutic exercise;Balance training;Stair training;Patient/family education    PT Goals (Current goals can be found in the Care Plan section)  Acute Rehab PT Goals Patient Stated Goal: to get stronger PT Goal Formulation: With patient Time For Goal Achievement: 12/23/22 Potential to Achieve Goals: Good    Frequency Min 2X/week     Co-evaluation PT/OT/SLP Co-Evaluation/Treatment: Yes Reason for Co-Treatment: For patient/therapist safety;To address functional/ADL transfers PT goals addressed during session: Mobility/safety with mobility;Balance OT goals addressed during session: ADL's and self-care       AM-PAC PT "6 Clicks" Mobility  Outcome Measure Help needed turning from your back to your side while in a flat bed without using bedrails?: None Help needed moving from lying on your back to sitting on the side of a flat bed without using bedrails?: None Help needed moving to and from a bed to a chair (including a wheelchair)?: A Little Help needed standing up from a chair using your arms (e.g., wheelchair or bedside chair)?: A Little Help needed to walk in hospital room?: A Little Help needed climbing 3-5 steps  with a railing? : A Little 6 Click Score: 20    End of Session Equipment Utilized During Treatment: Oxygen Activity Tolerance: Patient tolerated treatment well Patient left: in chair;with call bell/phone within reach;with chair alarm set Nurse Communication: Mobility status PT Visit Diagnosis: Unsteadiness on feet (R26.81);Muscle weakness (generalized) (M62.81);Other abnormalities of gait and mobility (R26.89)    Time: 1510-1534 PT Time Calculation (min) (ACUTE ONLY): 24 min   Charges:   PT Evaluation $PT Eval Moderate Complexity: 1 Mod          Faithlyn Recktenwald A. Gilford Rile PT, DPT [facility] - Acute Rehabilitation Services   Raaga Maeder A Almus Woodham 12/09/2022, 4:25 PM

## 2022-12-10 DIAGNOSIS — U071 COVID-19: Secondary | ICD-10-CM | POA: Diagnosis not present

## 2022-12-10 DIAGNOSIS — J9601 Acute respiratory failure with hypoxia: Secondary | ICD-10-CM | POA: Diagnosis not present

## 2022-12-10 DIAGNOSIS — I2699 Other pulmonary embolism without acute cor pulmonale: Secondary | ICD-10-CM | POA: Diagnosis not present

## 2022-12-10 DIAGNOSIS — J1282 Pneumonia due to coronavirus disease 2019: Secondary | ICD-10-CM | POA: Diagnosis not present

## 2022-12-10 NOTE — Plan of Care (Signed)

## 2022-12-10 NOTE — Progress Notes (Signed)
Physical Therapy Treatment Patient Details Name: Alice Donaldson MRN: [id number] DOB: 18-Feb-1946 Today's Date: 12/10/2022   History of Present Illness Alice Donaldson is a 76 y.o. 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 She was found to have IHKVQ-25 pneumonia complicated by acute hypoxic respiratory failure.    PT Comments    Patient supine in bed on arrival and agreeable to PT tx session. Continues to be limited by fatigue and SOB with decreasing spO2 levels. Able to complete sit to stand and step pivot transfer with no AD and min guard. Saturated in urine and attempted to encourage patient to perform pericare but patient reports being too fatigued to complete. Continued encouragement for OOB for meals to promote improving lung function. D/c plan remains appropriate if patient has family support.     Recommendations for follow up therapy are one component of a multi-disciplinary discharge planning process, led by the attending physician.  Recommendations may be updated based on patient status, additional functional criteria and insurance authorization.  Follow Up Recommendations  Home health PT     Assistance Recommended at Discharge Frequent or constant Supervision/Assistance  Patient can return home with the following A little help with walking and/or transfers;A little help with bathing/dressing/bathroom;Assistance with cooking/housework;Assist for transportation;Help with stairs or ramp for entrance   Equipment Recommendations  BSC/3in1    Recommendations for Other Services       Precautions / Restrictions Precautions Precautions: Fall Precaution Comments: watch O2 Restrictions Weight Bearing Restrictions: No     Mobility  Bed Mobility Overal bed mobility: Modified Independent              General bed mobility comments: increased time    Transfers Overall transfer level: Needs assistance Equipment used: None Transfers: Sit to/from Stand, Bed to chair/wheelchair/BSC Sit to Stand: Min guard   Step pivot transfers: Min guard       General transfer comment: min guard for safety. On 40L, spO2 dropped to 84% with mobility but quickly returns to 93% with focused breathing    Ambulation/Gait                   Stairs             Wheelchair Mobility    Modified Rankin (Stroke Patients Only)       Balance Overall balance assessment: Needs assistance Sitting-balance support: No upper extremity supported, Feet supported Sitting balance-Leahy Scale: Good     Standing balance support: No upper extremity supported Standing balance-Leahy Scale: Fair                              Cognition Arousal/Alertness: Awake/alert Behavior During Therapy: WFL for tasks assessed/performed Overall Cognitive Status: Within Functional Limits for tasks assessed                                          Exercises      General Comments        Pertinent Vitals/Pain Pain Assessment Pain Assessment: No/denies pain    Home Living  Prior Function            PT Goals (current goals can now be found in the care plan section) Acute Rehab PT Goals Patient Stated Goal: to get stronger PT Goal Formulation: With patient Time For Goal Achievement: 12/23/22 Potential to Achieve Goals: Good Progress towards PT goals: Progressing toward goals    Frequency    Min 2X/week      PT Plan Current plan remains appropriate    Co-evaluation              AM-PAC PT "6 Clicks" Mobility   Outcome Measure  Help needed turning from your back to your side while in a flat bed without using bedrails?: None Help needed moving from lying on your back to sitting on the side of a flat bed without  using bedrails?: None Help needed moving to and from a bed to a chair (including a wheelchair)?: A Little Help needed standing up from a chair using your arms (e.g., wheelchair or bedside chair)?: A Little Help needed to walk in hospital room?: A Lot Help needed climbing 3-5 steps with a railing? : A Lot 6 Click Score: 18    End of Session Equipment Utilized During Treatment: Oxygen Activity Tolerance: Patient limited by fatigue Patient left: in chair;with call bell/phone within reach;with chair alarm set Nurse Communication: Mobility status PT Visit Diagnosis: Unsteadiness on feet (R26.81);Muscle weakness (generalized) (M62.81);Other abnormalities of gait and mobility (R26.89)     Time: 7517-0017 PT Time Calculation (min) (ACUTE ONLY): 25 min  Charges:  $Therapeutic Activity: 23-37 mins                     Taeden Geller A. Gilford Rile PT, DPT [facility] - Acute Rehabilitation Services    Electa Sterry A Viyaan Champine 12/10/2022, 12:11 PM

## 2022-12-10 NOTE — Progress Notes (Signed)
Progress Note    Alice Donaldson  KGU:[id number] DOB: 09/21/1946  DOA: 12/05/2022 PCP: [name], MD      Brief Narrative:    Medical records reviewed and are as summarized below:  Alice Donaldson is a 76 y.o. 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  Symptoms started about 11 days prior to admission and progressively worsened.  She developed rigors with drenching sweats about 3 days prior to admission.  She went to her PCPs office because of worsening symptoms.  Her oxygen saturation was low and was requiring 4 L/min oxygen.  Subsequently, she was referred to the emergency department for further management.  She tested positive for COVID infection.  She was found to have SEGBT-51 pneumonia complicated by acute hypoxic respiratory failure.         Assessment/Plan:   Principal Problem:   Pneumonia due to COVID-19 virus Active Problems:   Acute respiratory failure with hypoxia (HCC)   Recurrent pulmonary emboli (HCC)   Spinal stenosis, lumbar region, with neurogenic claudication    Body mass index is 35.12 kg/m.  (Obesity)   COVID-19 pneumonia: Continue baricitinib and IV dexamethasone.  Completed 5-day course of azithromycin and ceftriaxone on 12/09/2022.  Antitussives as needed.  Acute hypoxic respiratory failure: Continue oxygen via heated humidified high flow nasal cannula, she is still on FiO2 of 65% blood flow rate has been decreased from 60 L/min to 40 L/min. Taper down oxygen as able   Debility: PT and OT recommended home health therapy.  History of DVT and recurrent pulmonary embolism: Continue Eliquis    Diet Order             Diet regular Room service appropriate? Yes; Fluid consistency: Thin  Diet effective now                             Consultants: None  Procedures: None    Medications:    apixaban  5 mg Oral BID   baricitinib  4 mg Oral Daily   dexamethasone (DECADRON) injection  6 mg Intravenous Q24H   multivitamin with minerals  1 tablet Oral Daily   pantoprazole  40 mg Oral BID   polyethylene glycol  17 g Oral Daily   sodium chloride flush  3 mL Intravenous Q12H   Continuous Infusions:     Anti-infectives (From admission, onward)    Start     Dose/Rate Route Frequency Ordered Stop   12/09/22 1800  azithromycin (ZITHROMAX) tablet 500 mg        500 mg Oral Daily 12/09/22 1047 12/09/22 1744   12/06/22 1500  azithromycin (ZITHROMAX) 500 mg in sodium chloride 0.9 % 250 mL IVPB  Status:  Discontinued        500 mg 250 mL/hr over 60 Minutes Intravenous Every 24 hours 12/05/22 1713 12/05/22 1849   12/05/22 1848  azithromycin (ZITHROMAX) 500 mg in sodium chloride 0.9 % 250 mL IVPB  Status:  Discontinued       Note to Pharmacy: Pt never got the Levofloxacin, I turned the pump off.   500 mg 250 mL/hr over 60 Minutes Intravenous Every 24 hours 12/05/22 1849 12/09/22 1046   12/05/22 1715  cefTRIAXone (ROCEPHIN) 2 g in sodium chloride 0.9 % 100 mL IVPB  2 g 200 mL/hr over 30 Minutes Intravenous Every 24 hours 12/05/22 1713 12/09/22 1822   12/05/22 1430  levofloxacin (LEVAQUIN) IVPB 750 mg  Status:  Discontinued        750 mg 100 mL/hr over 90 Minutes Intravenous  Once 12/05/22 1421 12/05/22 1632              Family Communication/Anticipated D/C date and plan/Code Status   DVT prophylaxis:  apixaban (ELIQUIS) tablet 5 mg     Code Status: Full Code  Family Communication: None Disposition Plan: Plan to discharge home with 3 to 5 days   Status is: Inpatient Remains inpatient appropriate because: Severe hypoxia       Subjective:   Interval events noted.  She is still requiring oxygen via high flow nasal cannula.  She has a cough but her breathing is better.   She also feels stronger and she was able to work with PT and OT yesterday.  Objective:    Vitals:   12/10/22 0800 12/10/22 0813 12/10/22 0835 12/10/22 1154  BP:   134/75 (!) 141/77  Pulse: (!) 58  60 (!) 57  Resp: '20  16 18  '$ Temp:   97.9 F (36.6 C) 98.2 F (36.8 C)  TempSrc:      SpO2: 96% 96% 91% 91%  Weight:      Height:       No data found.   Intake/Output Summary (Last 24 hours) at 12/10/2022 1346 Last data filed at 12/10/2022 1000 Gross per 24 hour  Intake 723 ml  Output 1900 ml  Net -1177 ml   Filed Weights   12/05/22 1412 12/07/22 0625  Weight: 107.2 kg 104.8 kg    Exam:   GEN: NAD SKIN: Warm and dry EYES: No pallor or icterus ENT: MMM CV: RRR PULM: Bibasilar rales, no wheezing ABD: soft, ND, NT, +BS CNS: AAO x 3, non focal EXT: No edema or tenderness     Data Reviewed:   I have personally reviewed following labs and imaging studies:  Labs: Labs show the following:   Basic Metabolic Panel: Recent Labs  Lab 12/05/22 1349 12/06/22 0540 12/07/22 0650 12/08/22 0456 12/09/22 0632  NA 140 142 143 144 139  K 3.4* 4.1 4.1 4.5 4.6  CL 104 109 109 111 107  CO2 '23 26 27 26 24  '$ GLUCOSE 134* 157* 130* 120* 106*  BUN 20 19 28* 29* 27*  CREATININE 0.99 0.70 0.89 0.74 0.60  CALCIUM 9.7 9.7 9.9 9.4 9.4   GFR Estimated Creatinine Clearance: 73.5 mL/min (by C-G formula based on SCr of 0.6 mg/dL). Liver Function Tests: Recent Labs  Lab 12/05/22 1349 12/06/22 0540 12/07/22 0650 12/08/22 0456 12/09/22 0632  AST '29 19 25 24 20  '$ ALT '20 15 19 20 19  '$ ALKPHOS 110 88 83 72 67  BILITOT 1.4* 0.8 0.7 0.6 0.6  PROT 7.9 6.8 6.9 6.4* 6.4*  ALBUMIN 2.8* 2.4* 2.5* 2.3* 2.4*   No results for input(s): "LIPASE", "AMYLASE" in the last 168 hours. No results for input(s): "AMMONIA" in the last 168 hours. Coagulation profile No results for input(s): "INR", "PROTIME" in the last 168 hours.  CBC: Recent Labs  Lab 12/05/22 1349 12/06/22 0540  12/07/22 0650 12/08/22 0456 12/09/22 0632  WBC 14.5* 7.8 16.9* 11.1* 9.8  NEUTROABS 10.1* 6.1 13.9* 8.8* 7.5  HGB 11.8* 10.3* 11.2* 10.7* 11.2*  HCT 38.3 33.1* 35.9* 34.1* 35.6*  MCV 82.2 81.5 82.0 82.4 81.5  PLT 396 351 394 369  381   Cardiac Enzymes: No results for input(s): "CKTOTAL", "CKMB", "CKMBINDEX", "TROPONINI" in the last 168 hours. BNP (last 3 results) No results for input(s): "PROBNP" in the last 8760 hours. CBG: No results for input(s): "GLUCAP" in the last 168 hours. D-Dimer: No results for input(s): "DDIMER" in the last 72 hours. Hgb A1c: No results for input(s): "HGBA1C" in the last 72 hours. Lipid Profile: No results for input(s): "CHOL", "HDL", "LDLCALC", "TRIG", "CHOLHDL", "LDLDIRECT" in the last 72 hours. Thyroid function studies: No results for input(s): "TSH", "T4TOTAL", "T3FREE", "THYROIDAB" in the last 72 hours.  Invalid input(s): "FREET3" Anemia work up: No results for input(s): "VITAMINB12", "FOLATE", "FERRITIN", "TIBC", "IRON", "RETICCTPCT" in the last 72 hours. Sepsis Labs: Recent Labs  Lab 12/05/22 1349 12/06/22 0540 12/07/22 0650 12/08/22 0456 12/09/22 0632  PROCALCITON 0.38  --   --   --   --   WBC 14.5* 7.8 16.9* 11.1* 9.8    Microbiology Recent Results (from the past 240 hour(s))  Resp panel by RT-PCR (RSV, Flu A&B, Covid) Anterior Nasal Swab     Status: Abnormal   Collection Time: 12/05/22  1:42 PM   Specimen: Anterior Nasal Swab  Result Value Ref Range Status   SARS Coronavirus 2 by RT PCR POSITIVE (A) NEGATIVE Final    Comment: (NOTE) SARS-CoV-2 target nucleic acids are DETECTED.  The SARS-CoV-2 RNA is generally detectable in upper respiratory specimens during the acute phase of infection. Positive results are indicative of the presence of the identified virus, but do not rule out bacterial infection or co-infection with other pathogens not detected by the test. Clinical correlation with patient history and other diagnostic  information is necessary to determine patient infection status. The expected result is Negative.  Fact Sheet for Patients: EntrepreneurPulse.com.au  Fact Sheet for Healthcare Providers: IncredibleEmployment.be  This test is not yet approved or cleared by the [place] FDA and  has been authorized for detection and/or diagnosis of SARS-CoV-2 by FDA under an Emergency Use Authorization (EUA).  This EUA will remain in effect (meaning this test can be used) for the duration of  the COVID-19 declaration under Section 564(b)(1) of the A ct, 21 U.S.C. section 360bbb-3(b)(1), unless the authorization is terminated or revoked sooner.     Influenza A by PCR NEGATIVE NEGATIVE Final   Influenza B by PCR NEGATIVE NEGATIVE Final    Comment: (NOTE) The Xpert Xpress SARS-CoV-2/FLU/RSV plus assay is intended as an aid in the diagnosis of influenza from Nasopharyngeal swab specimens and should not be used as a sole basis for treatment. Nasal washings and aspirates are unacceptable for Xpert Xpress SARS-CoV-2/FLU/RSV testing.  Fact Sheet for Patients: EntrepreneurPulse.com.au  Fact Sheet for Healthcare Providers: IncredibleEmployment.be  This test is not yet approved or cleared by the [place] FDA and has been authorized for detection and/or diagnosis of SARS-CoV-2 by FDA under an Emergency Use Authorization (EUA). This EUA will remain in effect (meaning this test can be used) for the duration of the COVID-19 declaration under Section 564(b)(1) of the Act, 21 U.S.C. section 360bbb-3(b)(1), unless the authorization is terminated or revoked.     Resp Syncytial Virus by PCR NEGATIVE NEGATIVE Final    Comment: (NOTE) Fact Sheet for Patients: EntrepreneurPulse.com.au  Fact Sheet for Healthcare Providers: IncredibleEmployment.be  This test is not yet approved or cleared by the  [place] FDA and has been authorized for detection and/or diagnosis of SARS-CoV-2 by FDA under an Emergency Use Authorization (EUA). This EUA will remain in effect (  meaning this test can be used) for the duration of the COVID-19 declaration under Section 564(b)(1) of the Act, 21 U.S.C. section 360bbb-3(b)(1), unless the authorization is terminated or revoked.  Performed at [facility], [address] [place], [place] [address]     Procedures and diagnostic studies:  No results found.             LOS: 5 days   Anaiyah Anglemyer  [facility]   Pager on [email or web address]. If 7PM-7AM, please contact night-coverage at [email or web address]     12/10/2022, 1:46 PM

## 2022-12-11 DIAGNOSIS — J9601 Acute respiratory failure with hypoxia: Secondary | ICD-10-CM | POA: Diagnosis not present

## 2022-12-11 DIAGNOSIS — J1282 Pneumonia due to coronavirus disease 2019: Secondary | ICD-10-CM | POA: Diagnosis not present

## 2022-12-11 DIAGNOSIS — U071 COVID-19: Secondary | ICD-10-CM | POA: Diagnosis not present

## 2022-12-11 NOTE — Progress Notes (Addendum)
1    Progress Note    Alice Donaldson  ZLD:[id number] DOB: 05/17/1946  DOA: 12/05/2022 PCP: [name], MD      Brief Narrative:    Medical records reviewed and are as summarized below:  Alice Donaldson is a 76 y.o. 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  Symptoms started about 11 days prior to admission and progressively worsened.  She developed rigors with drenching sweats about 3 days prior to admission.  She went to her PCPs office because of worsening symptoms.  Her oxygen saturation was low and was requiring 4 L/min oxygen.  Subsequently, she was referred to the emergency department for further management.  She tested positive for COVID infection.  She was found to have [id number] pneumonia complicated by acute hypoxic respiratory failure.         Assessment/Plan:   Principal Problem:   Pneumonia due to COVID-19 virus Active Problems:   Acute respiratory failure with hypoxia (HCC)   Recurrent pulmonary emboli (HCC)   Spinal stenosis, lumbar region, with neurogenic claudication    Body mass index is 35.12 kg/m.  (Obesity)   COVID-19 pneumonia: Continue IV dexamethasone and oral baricitinib.  Completed 5-day course of azithromycin and ceftriaxone on 12/09/2022.  Antitussives as needed for cough  Acute hypoxic respiratory failure: Slowly improving.  Continue oxygen via heated humidified high flow nasal cannula.  FiO2 is down from 65% to 45% and flow rate is still at 40 L/min.  Taper down oxygen as able..   Debility: PT and OT recommended home health therapy.  History of DVT and recurrent pulmonary embolism: Continue Eliquis    Diet Order             Diet regular Room service appropriate? Yes; Fluid consistency: Thin  Diet effective now                             Consultants: None  Procedures: None    Medications:    apixaban  5 mg Oral BID   baricitinib  4 mg Oral Daily   dexamethasone (DECADRON) injection  6 mg Intravenous Q24H   multivitamin with minerals  1 tablet Oral Daily   pantoprazole  40 mg Oral BID   polyethylene glycol  17 g Oral Daily   sodium chloride flush  3 mL Intravenous Q12H   Continuous Infusions:     Anti-infectives (From admission, onward)    Start     Dose/Rate Route Frequency Ordered Stop   12/09/22 1800  azithromycin (ZITHROMAX) tablet 500 mg        500 mg Oral Daily 12/09/22 1047 12/09/22 1744   12/06/22 1500  azithromycin (ZITHROMAX) 500 mg in sodium chloride 0.9 % 250 mL IVPB  Status:  Discontinued        500 mg 250 mL/hr over 60 Minutes Intravenous Every 24 hours 12/05/22 1713 12/05/22 1849   12/05/22 1848  azithromycin (ZITHROMAX) 500 mg in sodium chloride 0.9 % 250 mL IVPB  Status:  Discontinued       Note to Pharmacy: Pt never got the Levofloxacin, I turned the pump off.   500 mg 250 mL/hr over 60 Minutes Intravenous Every 24 hours 12/05/22 1849 12/09/22 1046   12/05/22 1715  cefTRIAXone (ROCEPHIN) 2 g in sodium chloride 0.9 % 100  mL IVPB        2 g 200 mL/hr over 30 Minutes Intravenous Every 24 hours 12/05/22 1713 12/09/22 1822   12/05/22 1430  levofloxacin (LEVAQUIN) IVPB 750 mg  Status:  Discontinued        750 mg 100 mL/hr over 90 Minutes Intravenous  Once 12/05/22 1421 12/05/22 1632              Family Communication/Anticipated D/C date and plan/Code Status   DVT prophylaxis:  apixaban (ELIQUIS) tablet 5 mg     Code Status: Full Code  Family Communication: None Disposition Plan: Plan to discharge home with 3 to 5 days   Status is: Inpatient Remains inpatient appropriate because: Severe hypoxia       Subjective:   She has no complaints.  She feels better.  She is comfortable at rest but has more cough and feels short of breath with activity.     Objective:    Vitals:   12/11/22 0017 12/11/22 0357 12/11/22 0730 12/11/22 0815  BP:    129/76  Pulse:   66 64  Resp:    18  Temp:  97.8 F (36.6 C)  (!) 97.5 F (36.4 C)  TempSrc:  Oral  Oral  SpO2: 93%  94% 93%  Weight:      Height:       No data found.   Intake/Output Summary (Last 24 hours) at 12/11/2022 1254 Last data filed at 12/11/2022 1000 Gross per 24 hour  Intake 720 ml  Output --  Net 720 ml   Filed Weights   12/05/22 1412 12/07/22 0625  Weight: 107.2 kg 104.8 kg    Exam:  GEN: NAD, sitting up in the chair SKIN: Warm and dry EYES: Anicteric ENT: MMM CV: RRR PULM: Bibasilar rales, no wheezing heard ABD: soft, obese, NT, +BS CNS: AAO x 3, non focal EXT: No edema or tenderness     Data Reviewed:   I have personally reviewed following labs and imaging studies:  Labs: Labs show the following:   Basic Metabolic Panel: Recent Labs  Lab 12/05/22 1349 12/06/22 0540 12/07/22 0650 12/08/22 0456 12/09/22 0632  NA 140 142 143 144 139  K 3.4* 4.1 4.1 4.5 4.6  CL 104 109 109 111 107  CO2 '23 26 27 26 24  '$ GLUCOSE 134* 157* 130* 120* 106*  BUN 20 19 28* 29* 27*  CREATININE 0.99 0.70 0.89 0.74 0.60  CALCIUM 9.7 9.7 9.9 9.4 9.4   GFR Estimated Creatinine Clearance: 73.5 mL/min (by C-G formula based on SCr of 0.6 mg/dL). Liver Function Tests: Recent Labs  Lab 12/05/22 1349 12/06/22 0540 12/07/22 0650 12/08/22 0456 12/09/22 0632  AST '29 19 25 24 20  '$ ALT '20 15 19 20 19  '$ ALKPHOS 110 88 83 72 67  BILITOT 1.4* 0.8 0.7 0.6 0.6  PROT 7.9 6.8 6.9 6.4* 6.4*  ALBUMIN 2.8* 2.4* 2.5* 2.3* 2.4*   No results for input(s): "LIPASE", "AMYLASE" in the last 168 hours. No results for input(s): "AMMONIA" in the last 168 hours. Coagulation profile No results for input(s): "INR", "PROTIME" in the last 168 hours.  CBC: Recent Labs  Lab 12/05/22 1349 12/06/22 0540 12/07/22 0650 12/08/22 0456 12/09/22 0632  WBC 14.5* 7.8 16.9* 11.1* 9.8  NEUTROABS  10.1* 6.1 13.9* 8.8* 7.5  HGB 11.8* 10.3* 11.2* 10.7* 11.2*  HCT 38.3 33.1* 35.9* 34.1* 35.6*  MCV 82.2 81.5 82.0 82.4 81.5  PLT 396 351 394 369 381   Cardiac Enzymes:  No results for input(s): "CKTOTAL", "CKMB", "CKMBINDEX", "TROPONINI" in the last 168 hours. BNP (last 3 results) No results for input(s): "PROBNP" in the last 8760 hours. CBG: No results for input(s): "GLUCAP" in the last 168 hours. D-Dimer: No results for input(s): "DDIMER" in the last 72 hours. Hgb A1c: No results for input(s): "HGBA1C" in the last 72 hours. Lipid Profile: No results for input(s): "CHOL", "HDL", "LDLCALC", "TRIG", "CHOLHDL", "LDLDIRECT" in the last 72 hours. Thyroid function studies: No results for input(s): "TSH", "T4TOTAL", "T3FREE", "THYROIDAB" in the last 72 hours.  Invalid input(s): "FREET3" Anemia work up: No results for input(s): "VITAMINB12", "FOLATE", "FERRITIN", "TIBC", "IRON", "RETICCTPCT" in the last 72 hours. Sepsis Labs: Recent Labs  Lab 12/05/22 1349 12/06/22 0540 12/07/22 0650 12/08/22 0456 12/09/22 0632  PROCALCITON 0.38  --   --   --   --   WBC 14.5* 7.8 16.9* 11.1* 9.8    Microbiology Recent Results (from the past 240 hour(s))  Resp panel by RT-PCR (RSV, Flu A&B, Covid) Anterior Nasal Swab     Status: Abnormal   Collection Time: 12/05/22  1:42 PM   Specimen: Anterior Nasal Swab  Result Value Ref Range Status   SARS Coronavirus 2 by RT PCR POSITIVE (A) NEGATIVE Final    Comment: (NOTE) SARS-CoV-2 target nucleic acids are DETECTED.  The SARS-CoV-2 RNA is generally detectable in upper respiratory specimens during the acute phase of infection. Positive results are indicative of the presence of the identified virus, but do not rule out bacterial infection or co-infection with other pathogens not detected by the test. Clinical correlation with patient history and other diagnostic information is necessary to determine patient infection status. The expected result is  Negative.  Fact Sheet for Patients: EntrepreneurPulse.com.au  Fact Sheet for Healthcare Providers: IncredibleEmployment.be  This test is not yet approved or cleared by the [place] FDA and  has been authorized for detection and/or diagnosis of SARS-CoV-2 by FDA under an Emergency Use Authorization (EUA).  This EUA will remain in effect (meaning this test can be used) for the duration of  the COVID-19 declaration under Section 564(b)(1) of the A ct, 21 U.S.C. section 360bbb-3(b)(1), unless the authorization is terminated or revoked sooner.     Influenza A by PCR NEGATIVE NEGATIVE Final   Influenza B by PCR NEGATIVE NEGATIVE Final    Comment: (NOTE) The Xpert Xpress SARS-CoV-2/FLU/RSV plus assay is intended as an aid in the diagnosis of influenza from Nasopharyngeal swab specimens and should not be used as a sole basis for treatment. Nasal washings and aspirates are unacceptable for Xpert Xpress SARS-CoV-2/FLU/RSV testing.  Fact Sheet for Patients: EntrepreneurPulse.com.au  Fact Sheet for Healthcare Providers: IncredibleEmployment.be  This test is not yet approved or cleared by the [place] FDA and has been authorized for detection and/or diagnosis of SARS-CoV-2 by FDA under an Emergency Use Authorization (EUA). This EUA will remain in effect (meaning this test can be used) for the duration of the COVID-19 declaration under Section 564(b)(1) of the Act, 21 U.S.C. section 360bbb-3(b)(1), unless the authorization is terminated or revoked.     Resp Syncytial Virus by PCR NEGATIVE NEGATIVE Final    Comment: (NOTE) Fact Sheet for Patients: EntrepreneurPulse.com.au  Fact Sheet for Healthcare Providers: IncredibleEmployment.be  This test is not yet approved or cleared by the [place] FDA and has been authorized for detection and/or diagnosis of SARS-CoV-2  by FDA under an Emergency Use Authorization (EUA). This EUA will remain in effect (meaning this test can be  used) for the duration of the COVID-19 declaration under Section 564(b)(1) of the Act, 21 U.S.C. section 360bbb-3(b)(1), unless the authorization is terminated or revoked.  Performed at [facility], [address] [place], [place] [address]     Procedures and diagnostic studies:  No results found.             LOS: 6 days   Daaiel Starlin  [facility]   Pager on [email or web address]. If 7PM-7AM, please contact night-coverage at [email or web address]     12/11/2022, 12:54 PM

## 2022-12-11 NOTE — Progress Notes (Signed)
OT Cancellation Note  Patient Details Name: Alice Donaldson MRN: [id number] DOB: [birth year]   Cancelled Treatment:    Reason Eval/Treat Not Completed: Fatigue/lethargy limiting ability to participate. Pt c/o fatigue from being up in chair all morning. Pt c/o air feeling hot coming from O2 nasal cannula. OT assisted pt with throwing away meal tray (pt finished eating) and with obtaining a Boost drink from her cooler. Pt requesting respiratory come fix her O2; OT reported to RN  Vania Rea 12/11/2022, 2:54 PM

## 2022-12-12 DIAGNOSIS — J1282 Pneumonia due to coronavirus disease 2019: Secondary | ICD-10-CM | POA: Diagnosis not present

## 2022-12-12 DIAGNOSIS — J9601 Acute respiratory failure with hypoxia: Secondary | ICD-10-CM | POA: Diagnosis not present

## 2022-12-12 DIAGNOSIS — U071 COVID-19: Secondary | ICD-10-CM | POA: Diagnosis not present

## 2022-12-12 NOTE — Progress Notes (Signed)
Occupational Therapy Treatment Patient Details Name: Alice Donaldson MRN: [id number] DOB: Mar 18, 1946 Today's Date: 12/12/2022   History of present illness Alice Donaldson is a 76 y.o. 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 She was found to have [id number] pneumonia complicated by acute hypoxic respiratory failure.   OT comments  Pt received seated in recliner. Appearing motivated and ready for therapy; willing to work with OT on grooming at the sink. T/f CGA and mobilized CGA without AD to bathroom. See flowsheet below for further details of session. Left seated in chair with all needs in reach.     Recommendations for follow up therapy are one component of a multi-disciplinary discharge planning process, led by the attending physician.  Recommendations may be updated based on patient status, additional functional criteria and insurance authorization.    Follow Up Recommendations  Home health OT     Assistance Recommended at Discharge Frequent or constant Supervision/Assistance  Patient can return home with the following  A little help with walking and/or transfers;A little help with bathing/dressing/bathroom   Equipment Recommendations  BSC/3in1    Recommendations for Other Services      Precautions / Restrictions Precautions Precautions: Fall Precaution Comments: watch O2 Restrictions Weight Bearing Restrictions: No       Mobility Bed Mobility                    Transfers     Transfers: Sit to/from Stand Sit to Stand: Min guard                 Balance Overall balance assessment: Needs assistance         Standing balance support: Single extremity supported Standing balance-Leahy Scale: Fair Standing balance comment: stood at sink for brushing teeth                            ADL either performed or assessed with clinical judgement   ADL Overall ADL's : Needs assistance/impaired     Grooming: Wash/dry hands;Oral care;Min guard;Standing;Brushing hair;Set up;Sitting Grooming Details (indicate cue type and reason): Pt able to stand at sink to brush teeth for approx 3 minutes; then needing seated rest break due to feeling short of breath; on 5L O2 throughout session; 88% while standing and with activity; recovered to low 90's. 97% at rest.                             Functional mobility during ADLs: Min guard (Walk to sink without AD) General ADL Comments: CGA mobility    Extremity/Trunk Assessment Upper Extremity Assessment Upper Extremity Assessment: Overall WFL for tasks assessed   Lower Extremity Assessment Lower Extremity Assessment: Defer to PT evaluation        Vision       Perception     Praxis      Cognition Arousal/Alertness: Awake/alert Behavior During Therapy: WFL for tasks assessed/performed Overall Cognitive Status: Within Functional Limits for tasks assessed                                 General Comments: Pt pleasant and motivated today        Exercises  Shoulder Instructions       General Comments On 5L O2, spO2 88-92% throughout mobility    Pertinent Vitals/ Pain       Pain Assessment Pain Assessment: 0-10 (chronic back pain) Pain Location: back, chronic Pain Descriptors / Indicators: Aching Pain Intervention(s): Limited activity within patient's tolerance  Home Living                                          Prior Functioning/Environment              Frequency  Min 2X/week        Progress Toward Goals  OT Goals(current goals can now be found in the care plan section)  Progress towards OT goals: Progressing toward goals  Acute Rehab OT Goals Patient Stated Goal: Go home OT Goal Formulation: With patient Time For Goal  Achievement: 12/23/22 Potential to Achieve Goals: Good ADL Goals Pt Will Perform Grooming: with modified independence;standing Pt Will Perform Lower Body Dressing: Independently;sit to/from stand Pt Will Transfer to Toilet: with modified independence;ambulating;regular height toilet  Plan Discharge plan remains appropriate    Co-evaluation    PT/OT/SLP Co-Evaluation/Treatment: Yes Reason for Co-Treatment: For patient/therapist safety   OT goals addressed during session: ADL's and self-care      AM-PAC OT "6 Clicks" Daily Activity     Outcome Measure   Help from another person eating meals?: None Help from another person taking care of personal grooming?: A Little Help from another person toileting, which includes using toliet, bedpan, or urinal?: A Little Help from another person bathing (including washing, rinsing, drying)?: A Little Help from another person to put on and taking off regular upper body clothing?: None Help from another person to put on and taking off regular lower body clothing?: A Little 6 Click Score: 20    End of Session    OT Visit Diagnosis: Other abnormalities of gait and mobility (R26.89);Muscle weakness (generalized) (M62.81)   Activity Tolerance Patient tolerated treatment well   Patient Left in chair;with call bell/phone within reach   Nurse Communication Mobility status        Time: 1610-9604 OT Time Calculation (min): 28 min  Charges: OT General Charges $OT Visit: 1 Visit OT Treatments $Self Care/Home Management : 8-22 mins  Waymon Amato, MS, OTR/L   Vania Rea 12/12/2022, 3:31 PM

## 2022-12-12 NOTE — Progress Notes (Signed)
Physical Therapy Treatment Patient Details Name: Alice Donaldson MRN: [id number] DOB: [birth year] Today's Date: 12/12/2022   History of Present Illness Alice Donaldson is a 76 y.o. 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 She was found to have [id number]-32 pneumonia complicated by acute hypoxic respiratory failure.    PT Comments    Patient progressing towards physical therapy goals. On 5L O2 [place], spO2 88-92% throughout mobility. Able to ambulate in the room to sink with min guard and no AD but required seated rest break between bouts.  Able to tolerate standing at sink ~5-6 minutes prior to seated rest break.  Patient excited about progress made this session. D/c plan remains appropriate.    Recommendations for follow up therapy are one component of a multi-disciplinary discharge planning process, led by the attending physician.  Recommendations may be updated based on patient status, additional functional criteria and insurance authorization.  Follow Up Recommendations  Home health PT     Assistance Recommended at Discharge Frequent or constant Supervision/Assistance  Patient can return home with the following A little help with walking and/or transfers;A little help with bathing/dressing/bathroom;Assistance with cooking/housework;Assist for transportation;Help with stairs or ramp for entrance   Equipment Recommendations  BSC/3in1    Recommendations for Other Services       Precautions / Restrictions Precautions Precautions: Fall Precaution Comments: watch O2 Restrictions Weight Bearing Restrictions: No     Mobility  Bed Mobility               General bed mobility comments: in recliner on arrival    Transfers Overall transfer level: Needs assistance Equipment used: None Transfers:  Sit to/from Stand Sit to Stand: Min guard           General transfer comment: min guard for safety. initially unsteady and reaching therapist to steady    Ambulation/Gait Ambulation/Gait assistance: Min guard Gait Distance (Feet): 10 Feet (10') Assistive device: None Gait Pattern/deviations: Step-through pattern, Decreased stride length, Trunk flexed Gait velocity: decreased     General Gait Details: min guard for safety during ambulation in the room. Able to walk to sink in room and stand ~5-6 minutes prior to requesting to sit. Ambulated back to recliner at end of session. SpO2 88-92% on 5L O2   Stairs             Wheelchair Mobility    Modified Rankin (Stroke Patients Only)       Balance Overall balance assessment: Needs assistance Sitting-balance support: No upper extremity supported, Feet supported Sitting balance-Leahy Scale: Good     Standing balance support: No upper extremity supported Standing balance-Leahy Scale: Fair                              Cognition Arousal/Alertness: Awake/alert Behavior During Therapy: WFL for tasks assessed/performed Overall Cognitive Status: Within Functional Limits for tasks assessed                                          Exercises Other Exercises Other Exercises: educated patient on seated marching, LAQ, SLR, and ankle pumps    General Comments General comments (skin integrity, edema, etc.): On 5L O2, spO2 88-92% throughout mobility  Pertinent Vitals/Pain Pain Assessment Pain Assessment: No/denies pain    Home Living                          Prior Function            PT Goals (current goals can now be found in the care plan section) Acute Rehab PT Goals PT Goal Formulation: With patient Time For Goal Achievement: 12/23/22 Potential to Achieve Goals: Good Progress towards PT goals: Progressing toward goals    Frequency    Min 2X/week      PT Plan  Current plan remains appropriate    Co-evaluation PT/OT/SLP Co-Evaluation/Treatment: Yes Reason for Co-Treatment: For patient/therapist safety PT goals addressed during session: Mobility/safety with mobility;Balance OT goals addressed during session: ADL's and self-care      AM-PAC PT "6 Clicks" Mobility   Outcome Measure  Help needed turning from your back to your side while in a flat bed without using bedrails?: None Help needed moving from lying on your back to sitting on the side of a flat bed without using bedrails?: None Help needed moving to and from a bed to a chair (including a wheelchair)?: A Little Help needed standing up from a chair using your arms (e.g., wheelchair or bedside chair)?: A Little Help needed to walk in hospital room?: A Little Help needed climbing 3-5 steps with a railing? : A Lot 6 Click Score: 19    End of Session Equipment Utilized During Treatment: Oxygen Activity Tolerance: Patient limited by fatigue Patient left: in chair;with call bell/phone within reach;with chair alarm set Nurse Communication: Mobility status PT Visit Diagnosis: Unsteadiness on feet (R26.81);Muscle weakness (generalized) (M62.81);Other abnormalities of gait and mobility (R26.89)     Time: 5681-2751 PT Time Calculation (min) (ACUTE ONLY): 24 min  Charges:  $Therapeutic Activity: 8-22 mins                     Derelle Cockrell A. Gilford Rile PT, DPT [facility] - Acute Rehabilitation Services    Shephanie Romas A Zekiah Caruth 12/12/2022, 3:38 PM

## 2022-12-12 NOTE — Progress Notes (Signed)
1    Progress Note    Alice Donaldson  DPO:[id number] DOB: 03-14-1946  DOA: 12/05/2022 PCP: [name], MD      Brief Narrative:    Medical records reviewed and are as summarized below:  Alice Donaldson is a 76 y.o. 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  Symptoms started about 11 days prior to admission and progressively worsened.  She developed rigors with drenching sweats about 3 days prior to admission.  She went to her PCPs office because of worsening symptoms.  Her oxygen saturation was low and was requiring 4 L/min oxygen.  Subsequently, she was referred to the emergency department for further management.  She tested positive for COVID infection.  She was found to have [id number] pneumonia complicated by acute hypoxic respiratory failure.         Assessment/Plan:   Principal Problem:   Pneumonia due to COVID-19 virus Active Problems:   Acute respiratory failure with hypoxia (HCC)   Recurrent pulmonary emboli (HCC)   Spinal stenosis, lumbar region, with neurogenic claudication    Body mass index is 35.12 kg/m.  (Obesity)   COVID-19 pneumonia: Continue baricitinib and IV dexamethasone.  Completed 5-day course of azithromycin and ceftriaxone on 12/09/2022.  Robitussin as needed for cough  Acute hypoxic respiratory failure: Improving.  She has been weaned off of heated humidified high flow nasal cannula.  She is now on 5 L/min oxygen via nasal cannula. Taper off oxygen as able   Debility: PT and OT recommended home health therapy.  History of DVT and recurrent pulmonary embolism: Continue Eliquis    Diet Order             Diet regular Room service appropriate? Yes; Fluid consistency: Thin  Diet effective now                             Consultants: None  Procedures: None    Medications:    apixaban  5 mg Oral BID   baricitinib  4 mg Oral Daily   dexamethasone (DECADRON) injection  6 mg Intravenous Q24H   multivitamin with minerals  1 tablet Oral Daily   pantoprazole  40 mg Oral BID   polyethylene glycol  17 g Oral Daily   sodium chloride flush  3 mL Intravenous Q12H   Continuous Infusions:     Anti-infectives (From admission, onward)    Start     Dose/Rate Route Frequency Ordered Stop   12/09/22 1800  azithromycin (ZITHROMAX) tablet 500 mg        500 mg Oral Daily 12/09/22 1047 12/09/22 1744   12/06/22 1500  azithromycin (ZITHROMAX) 500 mg in sodium chloride 0.9 % 250 mL IVPB  Status:  Discontinued        500 mg 250 mL/hr over 60 Minutes Intravenous Every 24 hours 12/05/22 1713 12/05/22 1849   12/05/22 1848  azithromycin (ZITHROMAX) 500 mg in sodium chloride 0.9 % 250 mL IVPB  Status:  Discontinued       Note to Pharmacy: Pt never got the Levofloxacin, I turned the pump off.   500 mg 250 mL/hr over 60 Minutes Intravenous Every 24 hours 12/05/22 1849 12/09/22 1046   12/05/22 1715  cefTRIAXone (ROCEPHIN) 2 g in sodium chloride 0.9 % 100 mL IVPB  2 g 200 mL/hr over 30 Minutes Intravenous Every 24 hours 12/05/22 1713 12/09/22 1822   12/05/22 1430  levofloxacin (LEVAQUIN) IVPB 750 mg  Status:  Discontinued        750 mg 100 mL/hr over 90 Minutes Intravenous  Once 12/05/22 1421 12/05/22 1632              Family Communication/Anticipated D/C date and plan/Code Status   DVT prophylaxis:  apixaban (ELIQUIS) tablet 5 mg     Code Status: Full Code  Family Communication: None Disposition Plan: Plan to discharge home with 2 to 3 days   Status is: Inpatient Remains inpatient appropriate because: Severe hypoxia       Subjective:   Interval events noted.  No shortness of breath or chest pain.  She still has a cough.  [name], RN, was at the bedside  Objective:     Vitals:   12/12/22 0500 12/12/22 0836 12/12/22 1051 12/12/22 1100  BP: (!) 118/55 (!) 125/90  124/74  Pulse:  65  71  Resp:    16  Temp: 98.1 F (36.7 C) 97.6 F (36.4 C)  97.7 F (36.5 C)  TempSrc: Oral Oral  Oral  SpO2:  97% 96% 98%  Weight:      Height:       No data found.   Intake/Output Summary (Last 24 hours) at 12/12/2022 1552 Last data filed at 12/12/2022 0900 Gross per 24 hour  Intake 240 ml  Output 300 ml  Net -60 ml   Filed Weights   12/05/22 1412 12/07/22 0625  Weight: 107.2 kg 104.8 kg    Exam:  GEN: NAD, sitting up in the chair SKIN: Warm and dry EYES: No pallor or icterus ENT: MMM CV: RRR PULM: Bibasilar rales ABD: soft, obese, NT, +BS CNS: AAO x 3, non focal EXT: No edema or tenderness      Data Reviewed:   I have personally reviewed following labs and imaging studies:  Labs: Labs show the following:   Basic Metabolic Panel: Recent Labs  Lab 12/06/22 0540 12/07/22 0650 12/08/22 0456 12/09/22 0632  NA 142 143 144 139  K 4.1 4.1 4.5 4.6  CL 109 109 111 107  CO2 '26 27 26 24  '$ GLUCOSE 157* 130* 120* 106*  BUN 19 28* 29* 27*  CREATININE 0.70 0.89 0.74 0.60  CALCIUM 9.7 9.9 9.4 9.4   GFR Estimated Creatinine Clearance: 73.5 mL/min (by C-G formula based on SCr of 0.6 mg/dL). Liver Function Tests: Recent Labs  Lab 12/06/22 0540 12/07/22 0650 12/08/22 0456 12/09/22 0632  AST '19 25 24 20  '$ ALT '15 19 20 19  '$ ALKPHOS 88 83 72 67  BILITOT 0.8 0.7 0.6 0.6  PROT 6.8 6.9 6.4* 6.4*  ALBUMIN 2.4* 2.5* 2.3* 2.4*   No results for input(s): "LIPASE", "AMYLASE" in the last 168 hours. No results for input(s): "AMMONIA" in the last 168 hours. Coagulation profile No results for input(s): "INR", "PROTIME" in the last 168 hours.  CBC: Recent Labs  Lab 12/06/22 0540 12/07/22 0650 12/08/22 0456 12/09/22 0632  WBC 7.8 16.9* 11.1* 9.8  NEUTROABS 6.1 13.9* 8.8* 7.5  HGB 10.3* 11.2* 10.7* 11.2*  HCT 33.1* 35.9* 34.1* 35.6*  MCV  81.5 82.0 82.4 81.5  PLT 351 394 369 381   Cardiac Enzymes: No results for input(s): "CKTOTAL", "CKMB", "CKMBINDEX", "TROPONINI" in the last 168 hours. BNP (last 3 results) No results for input(s): "PROBNP" in the last 8760 hours. CBG: No results for input(s): "  GLUCAP" in the last 168 hours. D-Dimer: No results for input(s): "DDIMER" in the last 72 hours. Hgb A1c: No results for input(s): "HGBA1C" in the last 72 hours. Lipid Profile: No results for input(s): "CHOL", "HDL", "LDLCALC", "TRIG", "CHOLHDL", "LDLDIRECT" in the last 72 hours. Thyroid function studies: No results for input(s): "TSH", "T4TOTAL", "T3FREE", "THYROIDAB" in the last 72 hours.  Invalid input(s): "FREET3" Anemia work up: No results for input(s): "VITAMINB12", "FOLATE", "FERRITIN", "TIBC", "IRON", "RETICCTPCT" in the last 72 hours. Sepsis Labs: Recent Labs  Lab 12/06/22 0540 12/07/22 0650 12/08/22 0456 12/09/22 0632  WBC 7.8 16.9* 11.1* 9.8    Microbiology Recent Results (from the past 240 hour(s))  Resp panel by RT-PCR (RSV, Flu A&B, Covid) Anterior Nasal Swab     Status: Abnormal   Collection Time: 12/05/22  1:42 PM   Specimen: Anterior Nasal Swab  Result Value Ref Range Status   SARS Coronavirus 2 by RT PCR POSITIVE (A) NEGATIVE Final    Comment: (NOTE) SARS-CoV-2 target nucleic acids are DETECTED.  The SARS-CoV-2 RNA is generally detectable in upper respiratory specimens during the acute phase of infection. Positive results are indicative of the presence of the identified virus, but do not rule out bacterial infection or co-infection with other pathogens not detected by the test. Clinical correlation with patient history and other diagnostic information is necessary to determine patient infection status. The expected result is Negative.  Fact Sheet for Patients: EntrepreneurPulse.com.au  Fact Sheet for Healthcare Providers: IncredibleEmployment.be  This  test is not yet approved or cleared by the [place] FDA and  has been authorized for detection and/or diagnosis of SARS-CoV-2 by FDA under an Emergency Use Authorization (EUA).  This EUA will remain in effect (meaning this test can be used) for the duration of  the COVID-19 declaration under Section 564(b)(1) of the A ct, 21 U.S.C. section 360bbb-3(b)(1), unless the authorization is terminated or revoked sooner.     Influenza A by PCR NEGATIVE NEGATIVE Final   Influenza B by PCR NEGATIVE NEGATIVE Final    Comment: (NOTE) The Xpert Xpress SARS-CoV-2/FLU/RSV plus assay is intended as an aid in the diagnosis of influenza from Nasopharyngeal swab specimens and should not be used as a sole basis for treatment. Nasal washings and aspirates are unacceptable for Xpert Xpress SARS-CoV-2/FLU/RSV testing.  Fact Sheet for Patients: EntrepreneurPulse.com.au  Fact Sheet for Healthcare Providers: IncredibleEmployment.be  This test is not yet approved or cleared by the [place] FDA and has been authorized for detection and/or diagnosis of SARS-CoV-2 by FDA under an Emergency Use Authorization (EUA). This EUA will remain in effect (meaning this test can be used) for the duration of the COVID-19 declaration under Section 564(b)(1) of the Act, 21 U.S.C. section 360bbb-3(b)(1), unless the authorization is terminated or revoked.     Resp Syncytial Virus by PCR NEGATIVE NEGATIVE Final    Comment: (NOTE) Fact Sheet for Patients: EntrepreneurPulse.com.au  Fact Sheet for Healthcare Providers: IncredibleEmployment.be  This test is not yet approved or cleared by the [place] FDA and has been authorized for detection and/or diagnosis of SARS-CoV-2 by FDA under an Emergency Use Authorization (EUA). This EUA will remain in effect (meaning this test can be used) for the duration of the COVID-19 declaration under  Section 564(b)(1) of the Act, 21 U.S.C. section 360bbb-3(b)(1), unless the authorization is terminated or revoked.  Performed at [facility], [address] [place], [place] [address]     Procedures and diagnostic studies:  No results found.  LOS: 7 days   Shawanna Zanders  [facility]   Pager on [email or web address]. If 7PM-7AM, please contact night-coverage at [email or web address]     12/12/2022, 3:52 PM

## 2022-12-13 DIAGNOSIS — U071 COVID-19: Secondary | ICD-10-CM | POA: Diagnosis not present

## 2022-12-13 DIAGNOSIS — J1282 Pneumonia due to coronavirus disease 2019: Secondary | ICD-10-CM | POA: Diagnosis not present

## 2022-12-13 MED ORDER — FUROSEMIDE 10 MG/ML IJ SOLN
20.0000 mg | Freq: Once | INTRAMUSCULAR | Status: AC
  Administered 2022-12-13: 20 mg via INTRAVENOUS
  Filled 2022-12-13: qty 2

## 2022-12-13 MED ORDER — LORAZEPAM 0.5 MG PO TABS
0.2500 mg | ORAL_TABLET | Freq: Every day | ORAL | Status: DC
  Administered 2022-12-13 – 2022-12-15 (×3): 0.25 mg via ORAL
  Filled 2022-12-13 (×3): qty 1

## 2022-12-13 NOTE — Progress Notes (Addendum)
1    Progress Note    Alice Donaldson  GNF:[id number] DOB: 03/19/1946  DOA: 12/05/2022 PCP: [name], MD      Brief Narrative:    Medical records reviewed and are as summarized below:  Alice Donaldson is a 76 y.o. female with PMH of HTN, HL, chronic PE and left leg DVT s/p 08/2020 thrombectomy on Eliquis, history of gastric ulcer, PVD who presented to the ED on 12/22 with acute hypoxia and dyspnea found to be COVID positive.  CXR shows significant bilateral opacities.   Assessment/Plan:   Principal Problem:   Pneumonia due to COVID-19 virus Active Problems:   Acute respiratory failure with hypoxia (HCC)   Recurrent pulmonary emboli (HCC)   Spinal stenosis, lumbar region, with neurogenic claudication   Acute Hypoxic Respiratory Failure: - Oxygen is down to 5L [place].  Continue to wean as tolerated.  COVID-19 pneumonia: Patient's baseline lung function includes history of COPP and mild passive atelectasis due to mass effect from large hiatal hernia. - Continue Baricitinib with stop date 12/20/2022 to complete 14 day course. - Continue nebs and Decadron 6 mg daily with stop date 12/15/22 to complete 10 day course. - S/P 5 day course of antibiotics Ceftriaxone Azithromycin (12/22-12/26). - Patient is pro-thrombotic at baseline in the setting of COVID.  Continue Eliquis for anticoagulation.  Check Echo to evaluate for any right heart strain and to evaluate cardiac function.  Hold off on Chest CT as this will not change management. - Start IV Lasix 20 mg daily.  History of PE / LLE DVT: - Continue Eliquis for anticoagulation.  Anxiety/Insomnia: - Start Ativan 0.25 mg nightly.  Large hiatal hernia / GERD: - PPI  Chronic Debility: - PT/OT recommends home with home health.  She may likely require home oxygen on discharge.   Morbid Obesity: Body mass index is 35.12 kg/m. - Counseled on weight loss through diet and lifestyle modifications.  Lumbar Spinal Stenosis Small  Bilateral Pulmonary Nodules, stable Left Hepatic Lobe Cyst   Diet Order             Diet regular Room service appropriate? Yes; Fluid consistency: Thin  Diet effective now                    Consultants: None  Procedures: None    Medications:    apixaban  5 mg Oral BID   baricitinib  4 mg Oral Daily   dexamethasone (DECADRON) injection  6 mg Intravenous Q24H   multivitamin with minerals  1 tablet Oral Daily   pantoprazole  40 mg Oral BID   polyethylene glycol  17 g Oral Daily   sodium chloride flush  3 mL Intravenous Q12H   Continuous Infusions:     Anti-infectives (From admission, onward)    Start     Dose/Rate Route Frequency Ordered Stop   12/09/22 1800  azithromycin (ZITHROMAX) tablet 500 mg        500 mg Oral Daily 12/09/22 1047 12/09/22 1744   12/06/22 1500  azithromycin (ZITHROMAX) 500 mg in sodium chloride 0.9 % 250 mL IVPB  Status:  Discontinued        500 mg 250 mL/hr over 60 Minutes Intravenous Every 24 hours 12/05/22 1713 12/05/22 1849   12/05/22 1848  azithromycin (ZITHROMAX) 500 mg in sodium chloride 0.9 % 250 mL IVPB  Status:  Discontinued       Note to Pharmacy: Pt never got the Levofloxacin, I turned the pump off.  500 mg 250 mL/hr over 60 Minutes Intravenous Every 24 hours 12/05/22 1849 12/09/22 1046   12/05/22 1715  cefTRIAXone (ROCEPHIN) 2 g in sodium chloride 0.9 % 100 mL IVPB        2 g 200 mL/hr over 30 Minutes Intravenous Every 24 hours 12/05/22 1713 12/09/22 1822   12/05/22 1430  levofloxacin (LEVAQUIN) IVPB 750 mg  Status:  Discontinued        750 mg 100 mL/hr over 90 Minutes Intravenous  Once 12/05/22 1421 12/05/22 1632         Family Communication/Anticipated D/C date and plan/Code Status   DVT prophylaxis:  apixaban (ELIQUIS) tablet 5 mg     Code Status: Full Code  Family Communication: None Disposition Plan: Plan to discharge home wit home health with 2 to 3 days  Status is: Inpatient Remains inpatient  appropriate because: Still requires 5L [place].  Discharge home with home health/PT once able to wean oxygen ~ 2L [place].   Subjective:   Ms. Rochele Pages denies any chest pain or shortness of breath. She states she is feeling better. She is a little down about being in the hospital.  We discussed gratitude, meditation, prayer, getting OOB, and talking to her family and loved ones daily.  Objective:    Vitals:   12/13/22 0534 12/13/22 0915 12/13/22 1058 12/13/22 1452  BP: 133/63 103/84 139/75 123/71  Pulse: 67 70 70 75  Resp: (!) '22 18 20 20  '$ Temp: 97.6 F (36.4 C) 98.3 F (36.8 C) (!) 97.3 F (36.3 C) (!) 97.4 F (36.3 C)  TempSrc: Oral Oral Oral Oral  SpO2: 93% 96% 95% 97%  Weight:      Height:       No data found.   Intake/Output Summary (Last 24 hours) at 12/13/2022 1722 Last data filed at 12/13/2022 1100 Gross per 24 hour  Intake 480 ml  Output 1600 ml  Net -1120 ml    Filed Weights   12/05/22 1412 12/07/22 0625  Weight: 107.2 kg 104.8 kg    Exam:  Physical Exam Constitutional:      Appearance: She is obese.     Comments: Chronically ill appearing Mild dyspnea 5L [place]  HENT:     Head: Normocephalic and atraumatic.  Eyes:     Extraocular Movements: Extraocular movements intact.     Pupils: Pupils are equal, round, and reactive to light.  Neck:     Thyroid: No thyromegaly.     Vascular: No JVD.  Cardiovascular:     Rate and Rhythm: Normal rate and regular rhythm.     Pulses: Normal pulses.     Heart sounds: Normal heart sounds.  Pulmonary:     Effort: Pulmonary effort is normal. No tachypnea, accessory muscle usage or respiratory distress.     Breath sounds: Wheezing present.  Abdominal:     General: Bowel sounds are normal.     Palpations: Abdomen is soft.  Musculoskeletal:        General: Normal range of motion.     Cervical back: Normal range of motion and neck supple.  Skin:    General: Skin is warm and dry.  Neurological:     General: No focal  deficit present.  Psychiatric:        Mood and Affect: Mood normal.        Data Reviewed:   I have personally reviewed following labs and imaging studies:  Labs: Labs show the following:   Basic Metabolic Panel: Recent Labs  Lab 12/07/22 0650 12/08/22 0456 12/09/22 0632  NA 143 144 139  K 4.1 4.5 4.6  CL 109 111 107  CO2 '27 26 24  '$ GLUCOSE 130* 120* 106*  BUN 28* 29* 27*  CREATININE 0.89 0.74 0.60  CALCIUM 9.9 9.4 9.4    GFR Estimated Creatinine Clearance: 73.5 mL/min (by C-G formula based on SCr of 0.6 mg/dL). Liver Function Tests: Recent Labs  Lab 12/07/22 0650 12/08/22 0456 12/09/22 0632  AST '25 24 20  '$ ALT '19 20 19  '$ ALKPHOS 83 72 67  BILITOT 0.7 0.6 0.6  PROT 6.9 6.4* 6.4*  ALBUMIN 2.5* 2.3* 2.4*     CBC: Recent Labs  Lab 12/07/22 0650 12/08/22 0456 12/09/22 0632  WBC 16.9* 11.1* 9.8  NEUTROABS 13.9* 8.8* 7.5  HGB 11.2* 10.7* 11.2*  HCT 35.9* 34.1* 35.6*  MCV 82.0 82.4 81.5  PLT 394 369 381     Sepsis Labs: Recent Labs  Lab 12/07/22 0650 12/08/22 0456 12/09/22 0632  WBC 16.9* 11.1* 9.8     Microbiology Recent Results (from the past 240 hour(s))  Resp panel by RT-PCR (RSV, Flu A&B, Covid) Anterior Nasal Swab     Status: Abnormal   Collection Time: 12/05/22  1:42 PM   Specimen: Anterior Nasal Swab  Result Value Ref Range Status   SARS Coronavirus 2 by RT PCR POSITIVE (A) NEGATIVE Final    Comment: (NOTE) SARS-CoV-2 target nucleic acids are DETECTED.  The SARS-CoV-2 RNA is generally detectable in upper respiratory specimens during the acute phase of infection. Positive results are indicative of the presence of the identified virus, but do not rule out bacterial infection or co-infection with other pathogens not detected by the test. Clinical correlation with patient history and other diagnostic information is necessary to determine patient infection status. The expected result is Negative.  Fact Sheet for  Patients: EntrepreneurPulse.com.au  Fact Sheet for Healthcare Providers: IncredibleEmployment.be  This test is not yet approved or cleared by the [place] FDA and  has been authorized for detection and/or diagnosis of SARS-CoV-2 by FDA under an Emergency Use Authorization (EUA).  This EUA will remain in effect (meaning this test can be used) for the duration of  the COVID-19 declaration under Section 564(b)(1) of the A ct, 21 U.S.C. section 360bbb-3(b)(1), unless the authorization is terminated or revoked sooner.     Influenza A by PCR NEGATIVE NEGATIVE Final   Influenza B by PCR NEGATIVE NEGATIVE Final    Comment: (NOTE) The Xpert Xpress SARS-CoV-2/FLU/RSV plus assay is intended as an aid in the diagnosis of influenza from Nasopharyngeal swab specimens and should not be used as a sole basis for treatment. Nasal washings and aspirates are unacceptable for Xpert Xpress SARS-CoV-2/FLU/RSV testing.  Fact Sheet for Patients: EntrepreneurPulse.com.au  Fact Sheet for Healthcare Providers: IncredibleEmployment.be  This test is not yet approved or cleared by the [place] FDA and has been authorized for detection and/or diagnosis of SARS-CoV-2 by FDA under an Emergency Use Authorization (EUA). This EUA will remain in effect (meaning this test can be used) for the duration of the COVID-19 declaration under Section 564(b)(1) of the Act, 21 U.S.C. section 360bbb-3(b)(1), unless the authorization is terminated or revoked.     Resp Syncytial Virus by PCR NEGATIVE NEGATIVE Final    Comment: (NOTE) Fact Sheet for Patients: EntrepreneurPulse.com.au  Fact Sheet for Healthcare Providers: IncredibleEmployment.be  This test is not yet approved or cleared by the [place] FDA and has been authorized for detection and/or diagnosis of  SARS-CoV-2 by FDA under an Emergency Use  Authorization (EUA). This EUA will remain in effect (meaning this test can be used) for the duration of the COVID-19 declaration under Section 564(b)(1) of the Act, 21 U.S.C. section 360bbb-3(b)(1), unless the authorization is terminated or revoked.  Performed at [facility], [address] [place], [place] [address]     Procedures and diagnostic studies:  No results found.     LOS: 8 days   George Hugh  [facility]   Pager on [email or web address]. If 7PM-7AM, please contact night-coverage at [email or web address]  12/13/2022, 5:22 PM

## 2022-12-13 NOTE — Plan of Care (Signed)

## 2022-12-14 ENCOUNTER — Inpatient Hospital Stay
Admit: 2022-12-14 | Discharge: 2022-12-14 | Disposition: A | Discharge status: Home / Self Care | Payer: PPO | Attending: Internal Medicine | Admitting: Internal Medicine

## 2022-12-14 LAB — BASIC METABOLIC PANEL
Anion gap: 6 (ref 5–15)
BUN: 29 mg/dL — ABNORMAL HIGH (ref 8–23)
CO2: 29 mmol/L (ref 22–32)
Calcium: 9.6 mg/dL (ref 8.9–10.3)
Chloride: 103 mmol/L (ref 98–111)
Creatinine, Ser: 0.69 mg/dL (ref 0.44–1.00)
GFR, Estimated: >60 mL/min (ref >60)
Glucose, Bld: 119 mg/dL — ABNORMAL HIGH (ref 70–99)
Potassium: 4.9 mmol/L (ref 3.5–5.1)
Sodium: 138 mmol/L (ref 135–145)

## 2022-12-14 LAB — CBC
HCT: 37.4 % (ref 36.0–46.0)
Hemoglobin: 12 g/dL (ref 12.0–15.0)
MCH: 26.3 pg (ref 26.0–34.0)
MCHC: 32.1 g/dL (ref 30.0–36.0)
MCV: 82 fL (ref 80.0–100.0)
Platelets: 449 10*3/uL — ABNORMAL HIGH (ref 150–400)
RBC: 4.56 MIL/uL (ref 3.87–5.11)
RDW: 14.7 % (ref 11.5–15.5)
WBC: 8.7 10*3/uL (ref 4.0–10.5)
nRBC: 0 % (ref 0.0–0.2)

## 2022-12-14 LAB — BRAIN NATRIURETIC PEPTIDE: B Natriuretic Peptide: 28 pg/mL (ref 0.0–100.0)

## 2022-12-14 MED ORDER — PERFLUTREN LIPID MICROSPHERE
1.0000 mL | INTRAVENOUS | Status: AC | PRN
  Administered 2022-12-14: 3 mL via INTRAVENOUS

## 2022-12-14 NOTE — Progress Notes (Signed)
.1    Progress Note    Alice Donaldson  ACZ:[id number] DOB: 04-20-1946  DOA: 12/05/2022 PCP: [name], MD      Brief Narrative:    Medical records reviewed and are as summarized below:  Alice Donaldson is a 76 y.o. female with PMH of HTN, HL, chronic PE and left leg DVT s/p 08/2020 thrombectomy on Eliquis, history of gastric ulcer, PVD who presented to the ED on 12/22 with acute hypoxia and dyspnea found to be COVID positive.  CXR shows significant bilateral opacities concerning for COVID-pneumonia versus superimposed bacterial pneumonia.  Patient on baricitinib to 12/20/2022, Decadron times 12/15/2022 and an antibiotics for possible superimposed pneumonia.  Patient finished a course of antibiotics on 12/26.   Assessment/Plan:   Principal Problem:   Pneumonia due to COVID-19 virus Active Problems:   Acute respiratory failure with hypoxia (HCC)   Recurrent pulmonary emboli (HCC)   Spinal stenosis, lumbar region, with neurogenic claudication   Acute Hypoxic Respiratory Failure: - Oxygen is down to 2L [place].  Continue to wean as tolerated.  COVID-19 pneumonia: Patient's baseline lung function includes history of COPP and mild passive atelectasis due to mass effect from large hiatal hernia. - Continue Baricitinib with stop date 12/20/2022 to complete 14 day course. - Continue nebs and Decadron 6 mg daily with stop date 12/15/22 to complete 10 day course. - S/P 5 day course of antibiotics Ceftriaxone Azithromycin (12/22-12/26). - Patient is pro-thrombotic at baseline in the setting of COVID.  Continue Eliquis for anticoagulation.  Check Echo to evaluate for any right heart strain and to evaluate cardiac function.  Hold off on Chest CT as this will not change management. -Follow Echo   History of PE / LLE DVT: - Continue Eliquis for anticoagulation.  Anxiety/Insomnia: - Start Ativan 0.25 mg nightly.  Large hiatal hernia / GERD: - PPI  Chronic Debility: - PT/OT recommends home  with home health.  She may likely require home oxygen on discharge.   Morbid Obesity: Body mass index is 35.12 kg/m. - Counseled on weight loss through diet and lifestyle modifications.  Lumbar Spinal Stenosis Small Bilateral Pulmonary Nodules, stable Left Hepatic Lobe Cyst   Diet Order             Diet regular Room service appropriate? Yes; Fluid consistency: Thin  Diet effective now                    Consultants: None  Procedures: None    Medications:    apixaban  5 mg Oral BID   baricitinib  4 mg Oral Daily   dexamethasone (DECADRON) injection  6 mg Intravenous Q24H   LORazepam  0.25 mg Oral QHS   multivitamin with minerals  1 tablet Oral Daily   pantoprazole  40 mg Oral BID   polyethylene glycol  17 g Oral Daily   sodium chloride flush  3 mL Intravenous Q12H   Continuous Infusions:     Anti-infectives (From admission, onward)    Start     Dose/Rate Route Frequency Ordered Stop   12/09/22 1800  azithromycin (ZITHROMAX) tablet 500 mg        500 mg Oral Daily 12/09/22 1047 12/09/22 1744   12/06/22 1500  azithromycin (ZITHROMAX) 500 mg in sodium chloride 0.9 % 250 mL IVPB  Status:  Discontinued        500 mg 250 mL/hr over 60 Minutes Intravenous Every 24 hours 12/05/22 1713 12/05/22 1849   12/05/22 1848  azithromycin (ZITHROMAX) 500 mg in sodium chloride 0.9 % 250 mL IVPB  Status:  Discontinued       Note to Pharmacy: Pt never got the Levofloxacin, I turned the pump off.   500 mg 250 mL/hr over 60 Minutes Intravenous Every 24 hours 12/05/22 1849 12/09/22 1046   12/05/22 1715  cefTRIAXone (ROCEPHIN) 2 g in sodium chloride 0.9 % 100 mL IVPB        2 g 200 mL/hr over 30 Minutes Intravenous Every 24 hours 12/05/22 1713 12/09/22 1822   12/05/22 1430  levofloxacin (LEVAQUIN) IVPB 750 mg  Status:  Discontinued        750 mg 100 mL/hr over 90 Minutes Intravenous  Once 12/05/22 1421 12/05/22 1632         Family Communication/Anticipated D/C date and  plan/Code Status   DVT prophylaxis:  apixaban (ELIQUIS) tablet 5 mg     Code Status: Full Code  Family Communication: None Disposition Plan: Plan to discharge home wit home health with 2 to 3 days  Status is: Inpatient Remains inpatient appropriate because: Still requires 5L [place].  Discharge home with home health/PT once able to wean oxygen ~ 2L [place].  Possibly tomorrow   Subjective:   [name] denies any chest pain or shortness of breath. She states she is feeling better.  Vital labs and imaging reviewed.  Patient vitally stable labs within reference range.  Patient's oxygen weaned to 2 L.  Possible discharge after PT eval tomorrow.   Objective:    Vitals:   12/13/22 1452 12/13/22 2014 12/13/22 2344 12/14/22 1247  BP: 123/71 129/78 125/67 (!) 142/86  Pulse: 75 88 70 67  Resp: 20 (!) 23 (!) 21 18  Temp: (!) 97.4 F (36.3 C) 97.9 F (36.6 C) 97.6 F (36.4 C) (!) 97.4 F (36.3 C)  TempSrc: Oral Oral Oral Oral  SpO2: 97% 94% 95% 98%  Weight:      Height:       No data found.   Intake/Output Summary (Last 24 hours) at 12/14/2022 1420 Last data filed at 12/14/2022 0900 Gross per 24 hour  Intake 483 ml  Output 550 ml  Net -67 ml    Filed Weights   12/05/22 1412 12/07/22 0625  Weight: 107.2 kg 104.8 kg    Exam:  Physical Exam Constitutional:      Appearance: She is obese.     Comments: Chronically ill appearing Mild dyspnea 5L [place]  HENT:     Head: Normocephalic and atraumatic.  Eyes:     Extraocular Movements: Extraocular movements intact.     Pupils: Pupils are equal, round, and reactive to light.  Neck:     Thyroid: No thyromegaly.     Vascular: No JVD.  Cardiovascular:     Rate and Rhythm: Normal rate and regular rhythm.     Pulses: Normal pulses.     Heart sounds: Normal heart sounds.  Pulmonary:     Effort: Pulmonary effort is normal. No tachypnea, accessory muscle usage or respiratory distress.     Breath sounds: Wheezing present.  Abdominal:      General: Bowel sounds are normal.     Palpations: Abdomen is soft.  Musculoskeletal:        General: Normal range of motion.     Cervical back: Normal range of motion and neck supple.  Skin:    General: Skin is warm and dry.  Neurological:     General: No focal deficit present.  Psychiatric:  Mood and Affect: Mood normal.        Data Reviewed:   I have personally reviewed following labs and imaging studies:  Labs: Labs show the following:   Basic Metabolic Panel: Recent Labs  Lab 12/08/22 0456 12/09/22 0632 12/14/22 0539  NA 144 139 138  K 4.5 4.6 4.9  CL 111 107 103  CO2 '26 24 29  '$ GLUCOSE 120* 106* 119*  BUN 29* 27* 29*  CREATININE 0.74 0.60 0.69  CALCIUM 9.4 9.4 9.6    GFR Estimated Creatinine Clearance: 73.5 mL/min (by C-G formula based on SCr of 0.69 mg/dL). Liver Function Tests: Recent Labs  Lab 12/08/22 0456 12/09/22 0632  AST 24 20  ALT 20 19  ALKPHOS 72 67  BILITOT 0.6 0.6  PROT 6.4* 6.4*  ALBUMIN 2.3* 2.4*     CBC: Recent Labs  Lab 12/08/22 0456 12/09/22 0632 12/14/22 0539  WBC 11.1* 9.8 8.7  NEUTROABS 8.8* 7.5  --   HGB 10.7* 11.2* 12.0  HCT 34.1* 35.6* 37.4  MCV 82.4 81.5 82.0  PLT 369 381 449*     Sepsis Labs: Recent Labs  Lab 12/08/22 0456 12/09/22 0632 12/14/22 0539  WBC 11.1* 9.8 8.7     Microbiology Recent Results (from the past 240 hour(s))  Resp panel by RT-PCR (RSV, Flu A&B, Covid) Anterior Nasal Swab     Status: Abnormal   Collection Time: 12/05/22  1:42 PM   Specimen: Anterior Nasal Swab  Result Value Ref Range Status   SARS Coronavirus 2 by RT PCR POSITIVE (A) NEGATIVE Final    Comment: (NOTE) SARS-CoV-2 target nucleic acids are DETECTED.  The SARS-CoV-2 RNA is generally detectable in upper respiratory specimens during the acute phase of infection. Positive results are indicative of the presence of the identified virus, but do not rule out bacterial infection or co-infection with other  pathogens not detected by the test. Clinical correlation with patient history and other diagnostic information is necessary to determine patient infection status. The expected result is Negative.  Fact Sheet for Patients: EntrepreneurPulse.com.au  Fact Sheet for Healthcare Providers: IncredibleEmployment.be  This test is not yet approved or cleared by the [place] FDA and  has been authorized for detection and/or diagnosis of SARS-CoV-2 by FDA under an Emergency Use Authorization (EUA).  This EUA will remain in effect (meaning this test can be used) for the duration of  the COVID-19 declaration under Section 564(b)(1) of the A ct, 21 U.S.C. section 360bbb-3(b)(1), unless the authorization is terminated or revoked sooner.     Influenza A by PCR NEGATIVE NEGATIVE Final   Influenza B by PCR NEGATIVE NEGATIVE Final    Comment: (NOTE) The Xpert Xpress SARS-CoV-2/FLU/RSV plus assay is intended as an aid in the diagnosis of influenza from Nasopharyngeal swab specimens and should not be used as a sole basis for treatment. Nasal washings and aspirates are unacceptable for Xpert Xpress SARS-CoV-2/FLU/RSV testing.  Fact Sheet for Patients: EntrepreneurPulse.com.au  Fact Sheet for Healthcare Providers: IncredibleEmployment.be  This test is not yet approved or cleared by the [place] FDA and has been authorized for detection and/or diagnosis of SARS-CoV-2 by FDA under an Emergency Use Authorization (EUA). This EUA will remain in effect (meaning this test can be used) for the duration of the COVID-19 declaration under Section 564(b)(1) of the Act, 21 U.S.C. section 360bbb-3(b)(1), unless the authorization is terminated or revoked.     Resp Syncytial Virus by PCR NEGATIVE NEGATIVE Final    Comment: (NOTE) Fact Sheet  for Patients: EntrepreneurPulse.com.au  Fact Sheet for Healthcare  Providers: IncredibleEmployment.be  This test is not yet approved or cleared by the [place] FDA and has been authorized for detection and/or diagnosis of SARS-CoV-2 by FDA under an Emergency Use Authorization (EUA). This EUA will remain in effect (meaning this test can be used) for the duration of the COVID-19 declaration under Section 564(b)(1) of the Act, 21 U.S.C. section 360bbb-3(b)(1), unless the authorization is terminated or revoked.  Performed at [facility], [address], [place] [address]     Procedures and diagnostic studies:  No results found.     LOS: 9 days   Kateleen Encarnacion [employer] on [email or web address]. If 7PM-7AM, please contact night-coverage at [email or web address]  12/14/2022, 2:20 PM

## 2022-12-14 NOTE — Progress Notes (Signed)
*  PRELIMINARY RESULTS* Echocardiogram 2D Echocardiogram has been performed.  Alice Donaldson 12/14/2022, 2:36 PM

## 2022-12-15 DIAGNOSIS — J1282 Pneumonia due to coronavirus disease 2019: Secondary | ICD-10-CM | POA: Diagnosis not present

## 2022-12-15 DIAGNOSIS — U071 COVID-19: Secondary | ICD-10-CM | POA: Diagnosis not present

## 2022-12-15 LAB — ECHOCARDIOGRAM COMPLETE
AR max vel: 2.26 cm2
AV Area VTI: 2.32 cm2
AV Area mean vel: 2.19 cm2
AV Mean grad: 4 mmHg
AV Peak grad: 8 mmHg
Ao pk vel: 1.41 m/s
Area-P 1/2: 3.03 cm2
Height: 68 in
S' Lateral: 2.2 cm
Weight: 3696 oz

## 2022-12-15 LAB — HEPATIC FUNCTION PANEL
ALT: 29 U/L (ref 0–44)
AST: 25 U/L (ref 15–41)
Albumin: 2.7 g/dL — ABNORMAL LOW (ref 3.5–5.0)
Alkaline Phosphatase: 75 U/L (ref 38–126)
Bilirubin, Direct: 0.1 mg/dL (ref 0.0–0.2)
Indirect Bilirubin: 1 mg/dL — ABNORMAL HIGH (ref 0.3–0.9)
Total Bilirubin: 1.1 mg/dL (ref 0.3–1.2)
Total Protein: 6.6 g/dL (ref 6.5–8.1)

## 2022-12-15 MED ORDER — FLUTICASONE PROPIONATE 50 MCG/ACT NA SUSP
2.0000 | Freq: Every day | NASAL | Status: DC
  Administered 2022-12-15 – 2022-12-16 (×2): 2 via NASAL
  Filled 2022-12-15: qty 16

## 2022-12-15 NOTE — TOC Initial Note (Signed)
Transition of Care [facility]) - Initial/Assessment Note    Patient Details  Name: Alice Donaldson MRN: [id number] Date of Birth: [birth year]  [facility]) CM/SW Contact:    Tiburcio Bash, LCSW Phone Number: 12/15/2022, 11:10 AM  Clinical Narrative:                  CSW spoke with patient regarding [facility] services, patient in agreement with PT OT and RN. No preference of agency.   Patient reports she continues to see Dr. Silvio Pate as PCP, [facility]   Patient reports she does not wear O2 at home, currently on 2L will watch for home O2 needs at dc.   Patient reports she may need a 3in1 but to hold off on ordering as she is coordinating with her son at home to see what she already has.     Expected Discharge Plan: [place] Barriers to Discharge: Continued Medical Work up   Patient Goals and CMS Choice Patient states their goals for this hospitalization and ongoing recovery are:: to go home CMS Medicare.gov Compare Post Acute Care list provided to:: Patient Choice offered to / list presented to : Patient      Expected Discharge Plan and Services       Living arrangements for the past 2 months: Single Family Home                           HH Arranged: PT, OT, RN          Prior Living Arrangements/Services Living arrangements for the past 2 months: Single Family Home Lives with:: Self                   Activities of Daily Living Home Assistive Devices/Equipment: None ADL Screening (condition at time of admission) Patient's cognitive ability adequate to safely complete daily activities?: Yes Is the patient deaf or have difficulty hearing?: No Does the patient have difficulty seeing, even when wearing glasses/contacts?: No Does the patient have difficulty concentrating, remembering, or making decisions?: No Patient able to express need for assistance with ADLs?: Yes Does the patient have difficulty dressing or  bathing?: No Independently performs ADLs?: Yes (appropriate for developmental age) Does the patient have difficulty walking or climbing stairs?: Yes (Now, since having COVID) Weakness of Legs: None Weakness of Arms/Hands: None  Permission Sought/Granted                  Emotional Assessment       Orientation: : Oriented to Self, Oriented to Place, Oriented to  Time, Oriented to Situation Alcohol / Substance Use: Not Applicable Psych Involvement: No (comment)  Admission diagnosis:  Shortness of breath [R06.02] Hypoxemia [R09.02] Multifocal pneumonia [J18.9] COVID [U07.1] Pneumonia due to COVID-19 virus [U07.1, J12.82] Patient Active Problem List   Diagnosis Date Noted   Acute respiratory failure with hypoxia ([place]) 12/06/2022   Hypoxia 12/05/2022   Pneumonia due to COVID-19 virus 12/05/2022   Aortic atherosclerosis ([place]) 01/02/2022   Recurrent pulmonary emboli ([place]) 08/21/2020   Gastritis 06/21/2018   Symptomatic anemia 06/15/2018   Morbid obesity ([place]) 06/03/2017   Iron deficiency anemia 08/14/2016   Advance directive discussed with patient 05/06/2016   Acute cough 02/08/2016   Episodic mood disorder ([place]) 04/18/2015   Routine general medical examination at a health care facility 04/12/2013   GERD (gastroesophageal reflux disease)    Allergic rhinitis  Spinal stenosis, lumbar region, with neurogenic claudication    Hyperlipidemia    DVT (deep venous thrombosis) ([place])    Chronic venous insufficiency    PCP:  [name], MD Pharmacy:   [facility] - [place], [place] - [address] [place] [address] Phone: [phone] Fax: [phone]     Social Determinants of Health (SDOH) Social History: [place]: No Food Insecurity (12/07/2022)  Housing: Low Risk  (12/07/2022)  Transportation Needs: No Transportation Needs (12/07/2022)  Utilities: Not At Risk (12/07/2022)   Depression (PHQ2-9): Low Risk  (01/02/2022)  Financial Resource Strain: Low Risk  (04/08/2021)  Tobacco Use: Medium Risk (12/05/2022)   SDOH Interventions:     Readmission Risk Interventions     No data to display

## 2022-12-15 NOTE — Progress Notes (Signed)
Progress Note    Alice Donaldson  ZHY:[id number] DOB: 1946/06/13  DOA: 12/05/2022 PCP: [name], MD   Brief Narrative:    Medical records reviewed and are as summarized below:  Alice Donaldson is a 77 y.o. female with PMH of HTN, HL, chronic PE and left leg DVT s/p 08/2020 thrombectomy on Eliquis, history of gastric ulcer, PVD who presented to the ED on 12/22 with acute hypoxia and dyspnea found to be COVID positive.  CXR shows significant bilateral opacities concerning for COVID-pneumonia versus superimposed bacterial pneumonia.  Patient on baricitinib to 12/20/2022, Decadron times 12/15/2022 and an antibiotics for possible superimposed pneumonia.  Patient finished a course of antibiotics on 12/26, she is stable and improving overall, now down to 2L [place], she is on room air at baseline.  Assessment/Plan:   Principal Problem:   Pneumonia due to COVID-19 virus Active Problems:   Acute respiratory failure with hypoxia (HCC)   Recurrent pulmonary emboli (HCC)   Spinal stenosis, lumbar region, with neurogenic claudication  Acute Hypoxic Respiratory Failure: - Oxygen is down to 2L [place].  Continue to wean as tolerated.  COVID-19 pneumonia: Patient's baseline lung function includes history of COPP and mild passive atelectasis due to mass effect from large hiatal hernia. - Continue Baricitinib with stop date 12/20/2022 to complete 14 day course. - Continue nebs and Decadron 6 mg completed today 12/15/22 to complete 10 day course. - S/P 5 day course of antibiotics Ceftriaxone Azithromycin (12/22-12/26). - Patient is pro-thrombotic at baseline in the setting of COVID.  Continue Eliquis for anticoagulation.  Echo to evaluate for any right heart strain and to evaluate cardiac function is unremarkable with normal EF and no evidence of R heart strain.  History of PE / LLE DVT: - Continue Eliquis for anticoagulation.  Anxiety/Insomnia: - Start Ativan 0.25 mg nightly.  Large hiatal hernia /  GERD: - PPI  Chronic Debility: - PT/OT recommends home with home health.  She may likely require home oxygen on discharge  Morbid Obesity: Body mass index is 35.12 kg/m. - Counseled on weight loss through diet and lifestyle modifications.  Lumbar Spinal Stenosis Small Bilateral Pulmonary Nodules, stable Left Hepatic Lobe Cyst   Diet Order             Diet regular Room service appropriate? Yes; Fluid consistency: Thin  Diet effective now                  Consultants: None  Procedures: None  Medications:    apixaban  5 mg Oral BID   baricitinib  4 mg Oral Daily   fluticasone  2 spray Each Nare Daily   LORazepam  0.25 mg Oral QHS   multivitamin with minerals  1 tablet Oral Daily   pantoprazole  40 mg Oral BID   polyethylene glycol  17 g Oral Daily   sodium chloride flush  3 mL Intravenous Q12H   Continuous Infusions:  Anti-infectives (From admission, onward)    Start     Dose/Rate Route Frequency Ordered Stop   12/09/22 1800  azithromycin (ZITHROMAX) tablet 500 mg        500 mg Oral Daily 12/09/22 1047 12/09/22 1744   12/06/22 1500  azithromycin (ZITHROMAX) 500 mg in sodium chloride 0.9 % 250 mL IVPB  Status:  Discontinued        500 mg 250 mL/hr over 60 Minutes Intravenous Every 24 hours 12/05/22 1713 12/05/22 1849   12/05/22 1848  azithromycin (ZITHROMAX)  500 mg in sodium chloride 0.9 % 250 mL IVPB  Status:  Discontinued       Note to Pharmacy: Pt never got the Levofloxacin, I turned the pump off.   500 mg 250 mL/hr over 60 Minutes Intravenous Every 24 hours 12/05/22 1849 12/09/22 1046   12/05/22 1715  cefTRIAXone (ROCEPHIN) 2 g in sodium chloride 0.9 % 100 mL IVPB        2 g 200 mL/hr over 30 Minutes Intravenous Every 24 hours 12/05/22 1713 12/09/22 1822   12/05/22 1430  levofloxacin (LEVAQUIN) IVPB 750 mg  Status:  Discontinued        750 mg 100 mL/hr over 90 Minutes Intravenous  Once 12/05/22 1421 12/05/22 1632        Family  Communication/Anticipated D/C date and plan/Code Status   DVT prophylaxis:  apixaban (ELIQUIS) tablet 5 mg     Code Status: Full Code  Family Communication: None Disposition Plan: Plan to discharge home with home health likely in AM.  Status is: Inpatient Remains inpatient appropriate because: Still requires 2L [place].  Will wean to room air likely home in AM.   Subjective:   [name] denies any chest pain or shortness of breath. She states she is feeling better still with productive cough. On 2L [place] this AM, she is on room air at baseline.   Objective:    Vitals:   12/14/22 1646 12/14/22 2021 12/15/22 0426 12/15/22 0935  BP: 132/71 136/62 (!) 115/53 (!) 122/105  Pulse: 74 76 73 75  Resp: 20 (!) '28 20 16  '$ Temp: (!) 97.5 F (36.4 C) 98.1 F (36.7 C) (!) 97.5 F (36.4 C) 97.8 F (36.6 C)  TempSrc: Oral Oral Axillary Axillary  SpO2: 94% 92% 95% 95%  Weight:      Height:       No data found.   Intake/Output Summary (Last 24 hours) at 12/15/2022 1042 Last data filed at 12/15/2022 1000 Gross per 24 hour  Intake 363 ml  Output 800 ml  Net -437 ml    Filed Weights   12/05/22 1412 12/07/22 0625  Weight: 107.2 kg 104.8 kg    Exam:  Physical Exam Vitals reviewed.  Constitutional:      General: She is not in acute distress.    Appearance: She is well-developed. She is obese. She is not ill-appearing.     Comments: Chronically ill appearing Mild dyspnea 5L [place]  HENT:     Head: Normocephalic and atraumatic.  Eyes:     Extraocular Movements: Extraocular movements intact.     Pupils: Pupils are equal, round, and reactive to light.  Neck:     Thyroid: No thyromegaly.     Vascular: No JVD.  Cardiovascular:     Rate and Rhythm: Normal rate and regular rhythm.     Pulses: Normal pulses.     Heart sounds: Normal heart sounds.  Pulmonary:     Effort: Pulmonary effort is normal. No tachypnea, accessory muscle usage or respiratory distress.     Breath sounds: No wheezing  or rhonchi.  Abdominal:     General: Bowel sounds are normal.     Palpations: Abdomen is soft.  Musculoskeletal:        General: Normal range of motion.     Cervical back: Normal range of motion and neck supple.  Skin:    General: Skin is warm and dry.  Neurological:     General: No focal deficit present.     Mental  Status: She is alert.  Psychiatric:        Mood and Affect: Mood normal.     Data Reviewed:   I have personally reviewed following labs and imaging studies:  Labs: Labs show the following:   Basic Metabolic Panel: Recent Labs  Lab 12/09/22 0632 12/14/22 0539  NA 139 138  K 4.6 4.9  CL 107 103  CO2 24 29  GLUCOSE 106* 119*  BUN 27* 29*  CREATININE 0.60 0.69  CALCIUM 9.4 9.6    GFR Estimated Creatinine Clearance: 73.5 mL/min (by C-G formula based on SCr of 0.69 mg/dL). Liver Function Tests: Recent Labs  Lab 12/09/22 8182 12/15/22 0620  AST 20 25  ALT 19 29  ALKPHOS 67 75  BILITOT 0.6 1.1  PROT 6.4* 6.6  ALBUMIN 2.4* 2.7*   CBC: Recent Labs  Lab 12/09/22 0632 12/14/22 0539  WBC 9.8 8.7  NEUTROABS 7.5  --   HGB 11.2* 12.0  HCT 35.6* 37.4  MCV 81.5 82.0  PLT 381 449*     Sepsis Labs: Recent Labs  Lab 12/09/22 0632 12/14/22 0539  WBC 9.8 8.7    Microbiology Recent Results (from the past 240 hour(s))  Resp panel by RT-PCR (RSV, Flu A&B, Covid) Anterior Nasal Swab     Status: Abnormal   Collection Time: 12/05/22  1:42 PM   Specimen: Anterior Nasal Swab  Result Value Ref Range Status   SARS Coronavirus 2 by RT PCR POSITIVE (A) NEGATIVE Final    Comment: (NOTE) SARS-CoV-2 target nucleic acids are DETECTED.  The SARS-CoV-2 RNA is generally detectable in upper respiratory specimens during the acute phase of infection. Positive results are indicative of the presence of the identified virus, but do not rule out bacterial infection or co-infection with other pathogens not detected by the test. Clinical correlation with patient  history and other diagnostic information is necessary to determine patient infection status. The expected result is Negative.  Fact Sheet for Patients: EntrepreneurPulse.com.au  Fact Sheet for Healthcare Providers: IncredibleEmployment.be  This test is not yet approved or cleared by the [place] FDA and  has been authorized for detection and/or diagnosis of SARS-CoV-2 by FDA under an Emergency Use Authorization (EUA).  This EUA will remain in effect (meaning this test can be used) for the duration of  the COVID-19 declaration under Section 564(b)(1) of the A ct, 21 U.S.C. section 360bbb-3(b)(1), unless the authorization is terminated or revoked sooner.     Influenza A by PCR NEGATIVE NEGATIVE Final   Influenza B by PCR NEGATIVE NEGATIVE Final    Comment: (NOTE) The Xpert Xpress SARS-CoV-2/FLU/RSV plus assay is intended as an aid in the diagnosis of influenza from Nasopharyngeal swab specimens and should not be used as a sole basis for treatment. Nasal washings and aspirates are unacceptable for Xpert Xpress SARS-CoV-2/FLU/RSV testing.  Fact Sheet for Patients: EntrepreneurPulse.com.au  Fact Sheet for Healthcare Providers: IncredibleEmployment.be  This test is not yet approved or cleared by the [place] FDA and has been authorized for detection and/or diagnosis of SARS-CoV-2 by FDA under an Emergency Use Authorization (EUA). This EUA will remain in effect (meaning this test can be used) for the duration of the COVID-19 declaration under Section 564(b)(1) of the Act, 21 U.S.C. section 360bbb-3(b)(1), unless the authorization is terminated or revoked.     Resp Syncytial Virus by PCR NEGATIVE NEGATIVE Final    Comment: (NOTE) Fact Sheet for Patients: EntrepreneurPulse.com.au  Fact Sheet for Healthcare Providers: IncredibleEmployment.be  This test is  not yet  approved or cleared by the [place] and has been authorized for detection and/or diagnosis of SARS-CoV-2 by FDA under an Emergency Use Authorization (EUA). This EUA will remain in effect (meaning this test can be used) for the duration of the COVID-19 declaration under Section 564(b)(1) of the Act, 21 U.S.C. section 360bbb-3(b)(1), unless the authorization is terminated or revoked.  Performed at [facility], [address] [place], [place] [address]     Procedures and diagnostic studies:  ECHOCARDIOGRAM COMPLETE  Result Date: 12/15/2022    ECHOCARDIOGRAM REPORT   Patient Name:   Alice Donaldson Date of Exam: 12/14/2022 Medical Rec #:  [id number]      Height:       68.0 in Accession #:    [id number]     Weight:       231.0 lb Date of Birth:  31-Mar-1946      BSA:          2.173 m Patient Age:    56 years       BP:           142/86 mmHg Patient Gender: F              HR:           66 bpm. Exam Location:  [facility] Procedure: 2D Echo, Color Doppler, Cardiac Doppler and Intracardiac            Opacification Agent Indications:     R06.00 Dyspnea  History:         Patient has no prior history of Echocardiogram examinations.                  Risk Factors:Dyslipidemia. History of DVT; pt tested positive                  for COVID-19 on 12/05/22.  Sonographer:     Charmayne Sheer Referring Phys:  [id number] George Hugh Diagnosing Phys: Isaias Cowman MD  Sonographer Comments: Suboptimal apical window and no subcostal window. IMPRESSIONS  1. Left ventricular ejection fraction, by estimation, is 60 to 65%. The left ventricle has normal function. The left ventricle has no regional wall motion abnormalities. There is mild left ventricular hypertrophy. Left ventricular diastolic parameters were normal.  2. Right ventricular systolic function is normal. The right ventricular size is normal.  3. The mitral valve is normal in structure. Mild mitral valve regurgitation. No evidence of mitral stenosis.  4.  Tricuspid valve regurgitation is mild to moderate.  5. The aortic valve is normal in structure. Aortic valve regurgitation is not visualized. No aortic stenosis is present.  6. The inferior vena cava is normal in size with greater than 50% respiratory variability, suggesting right atrial pressure of 3 mmHg. FINDINGS  Left Ventricle: Left ventricular ejection fraction, by estimation, is 60 to 65%. The left ventricle has normal function. The left ventricle has no regional wall motion abnormalities. Definity contrast agent was given IV to delineate the left ventricular  endocardial borders. The left ventricular internal cavity size was normal in size. There is mild left ventricular hypertrophy. Left ventricular diastolic parameters were normal. Right Ventricle: The right ventricular size is normal. No increase in right ventricular wall thickness. Right ventricular systolic function is normal. Left Atrium: Left atrial size was normal in size. Right Atrium: Right atrial size was normal in size. Pericardium: There is no evidence of pericardial effusion. Mitral Valve: The mitral valve is normal in structure. Mild mitral  valve regurgitation. No evidence of mitral valve stenosis. Tricuspid Valve: The tricuspid valve is normal in structure. Tricuspid valve regurgitation is mild to moderate. No evidence of tricuspid stenosis. Aortic Valve: The aortic valve is normal in structure. Aortic valve regurgitation is not visualized. No aortic stenosis is present. Aortic valve mean gradient measures 4.0 mmHg. Aortic valve peak gradient measures 8.0 mmHg. Aortic valve area, by VTI measures 2.32 cm. Pulmonic Valve: The pulmonic valve was normal in structure. Pulmonic valve regurgitation is not visualized. No evidence of pulmonic stenosis. Aorta: The aortic root is normal in size and structure. Venous: The inferior vena cava is normal in size with greater than 50% respiratory variability, suggesting right atrial pressure of 3 mmHg.  IAS/Shunts: No atrial level shunt detected by color flow Doppler.  LEFT VENTRICLE PLAX 2D LVIDd:         3.80 cm   Diastology LVIDs:         2.20 cm   LV e' medial:    5.00 cm/s LV PW:         1.20 cm   LV E/e' medial:  16.5 LV IVS:        1.00 cm   LV e' lateral:   7.29 cm/s LVOT diam:     1.80 cm   LV E/e' lateral: 11.3 LV SV:         54 LV SV Index:   25 LVOT Area:     2.54 cm  LEFT ATRIUM             Index LA diam:        2.30 cm 1.06 cm/m LA Vol (A2C):   39.3 ml 18.09 ml/m LA Vol (A4C):   71.5 ml 32.91 ml/m LA Biplane Vol: 54.9 ml 25.27 ml/m  AORTIC VALVE                    PULMONIC VALVE AV Area (Vmax):    2.26 cm     PV Vmax:       1.02 m/s AV Area (Vmean):   2.19 cm     PV Vmean:      77.900 cm/s AV Area (VTI):     2.32 cm     PV VTI:        0.201 m AV Vmax:           141.00 cm/s  PV Peak grad:  4.2 mmHg AV Vmean:          93.100 cm/s  PV Mean grad:  3.0 mmHg AV VTI:            0.235 m AV Peak Grad:      8.0 mmHg AV Mean Grad:      4.0 mmHg LVOT Vmax:         125.00 cm/s LVOT Vmean:        80.300 cm/s LVOT VTI:          0.214 m LVOT/AV VTI ratio: 0.91  AORTA Ao Root diam: 2.80 cm MITRAL VALVE               TRICUSPID VALVE MV Area (PHT): 3.03 cm    TR Peak grad:   36.7 mmHg MV Decel Time: 250 msec    TR Vmax:        303.00 cm/s MV E velocity: 82.30 cm/s MV A velocity: 86.50 cm/s  SHUNTS MV E/A ratio:  0.95        Systemic VTI:  0.21 m  Systemic Diam: 1.80 cm Isaias Cowman MD Electronically signed by Isaias Cowman MD Signature Date/Time: 12/15/2022/9:55:45 AM    Final      LOS: 10 days   Alice Donaldson  [facility]   Pager on [email or web address]. If 7PM-7AM, please contact night-coverage at [email or web address]  12/15/2022, 10:42 AM

## 2022-12-16 DIAGNOSIS — J1282 Pneumonia due to coronavirus disease 2019: Secondary | ICD-10-CM | POA: Diagnosis not present

## 2022-12-16 DIAGNOSIS — U071 COVID-19: Secondary | ICD-10-CM | POA: Diagnosis not present

## 2022-12-16 NOTE — Progress Notes (Signed)
Physical Therapy Treatment Patient Details Name: Alice Donaldson MRN: [id number] DOB: Sep 26, 1946 Today's Date: 12/16/2022   History of Present Illness Alice Donaldson is a 77 y.o. 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 She was found to have [id number] pneumonia complicated by acute hypoxic respiratory failure.    PT Comments    Patient progressing towards physical therapy goals. Patient ambulated to bathroom on RA with drop in spO2 to 84%. Ambulated back to recliner on 2L O2 with similar findings. On 3L O2, patient ambulated 57' with RW and supervision with spO2 88% or greater. Patient required frequent seated and standing rest breaks during session. Easily fatigues and would benefit from rollator for mobility. Encouraged continued mobility to improve endurance. D/c plan remains appropriate but patient may need assistance throughout the day for ADLs and iADLs.    Recommendations for follow up therapy are one component of a multi-disciplinary discharge planning process, led by the attending physician.  Recommendations may be updated based on patient status, additional functional criteria and insurance authorization.  Follow Up Recommendations  Home health PT     Assistance Recommended at Discharge Frequent or constant Supervision/Assistance  Patient can return home with the following A little help with walking and/or transfers;A little help with bathing/dressing/bathroom;Assistance with cooking/housework;Assist for transportation;Help with stairs or ramp for entrance   Equipment Recommendations  BSC/3in1    Recommendations for Other Services       Precautions / Restrictions Precautions Precautions: Fall Precaution Comments: watch O2 Restrictions Weight Bearing Restrictions: No     Mobility  Bed  Mobility Overal bed mobility: Modified Independent                  Transfers Overall transfer level: Needs assistance Equipment used: None Transfers: Sit to/from Stand Sit to Stand: Supervision                Ambulation/Gait Ambulation/Gait assistance: Supervision Gait Distance (Feet): 10 Feet (+10', +50') Assistive device: Rolling Johnmark Geiger (2 wheels) Gait Pattern/deviations: Step-through pattern, Decreased stride length Gait velocity: decreased     General Gait Details: supervision for safety. Requires 3L O2 to maintain spO2 >88% during mobility   Stairs             Wheelchair Mobility    Modified Rankin (Stroke Patients Only)       Balance Overall balance assessment: Needs assistance Sitting-balance support: No upper extremity supported, Feet supported Sitting balance-Leahy Scale: Good     Standing balance support: Bilateral upper extremity supported Standing balance-Leahy Scale: Fair                              Cognition Arousal/Alertness: Awake/alert Behavior During Therapy: WFL for tasks assessed/performed Overall Cognitive Status: Within Functional Limits for tasks assessed                                          Exercises      General Comments        Pertinent Vitals/Pain Pain Assessment Pain Assessment: Faces Faces Pain Scale: Hurts little more Pain Location: back, chronic Pain Descriptors / Indicators: Aching Pain Intervention(s): Limited activity within patient's tolerance, Monitored during session    Home Living  Prior Function            PT Goals (current goals can now be found in the care plan section) Acute Rehab PT Goals Patient Stated Goal: to get stronger PT Goal Formulation: With patient Time For Goal Achievement: 12/23/22 Potential to Achieve Goals: Good Progress towards PT goals: Progressing toward goals    Frequency    Min  2X/week      PT Plan Current plan remains appropriate    Co-evaluation              AM-PAC PT "6 Clicks" Mobility   Outcome Measure  Help needed turning from your back to your side while in a flat bed without using bedrails?: None Help needed moving from lying on your back to sitting on the side of a flat bed without using bedrails?: None Help needed moving to and from a bed to a chair (including a wheelchair)?: A Little Help needed standing up from a chair using your arms (e.g., wheelchair or bedside chair)?: A Little Help needed to walk in hospital room?: A Little Help needed climbing 3-5 steps with a railing? : A Lot 6 Click Score: 19    End of Session Equipment Utilized During Treatment: Oxygen Activity Tolerance: Patient limited by fatigue Patient left: in chair;with call bell/phone within reach Nurse Communication: Mobility status PT Visit Diagnosis: Unsteadiness on feet (R26.81);Muscle weakness (generalized) (M62.81);Other abnormalities of gait and mobility (R26.89)     Time: 1030-1109 PT Time Calculation (min) (ACUTE ONLY): 39 min  Charges:  $Therapeutic Activity: 38-52 mins                     Joban Colledge A. Gilford Rile PT, DPT [facility] - Acute Rehabilitation Services    Valborg Friar A Jamye Balicki 12/16/2022, 11:20 AM

## 2022-12-16 NOTE — Progress Notes (Signed)
Patient O2 saturations dropped below 88% after ambulating to the rest room on 2 L.  Patient reports some worsening SOB with activity.   Returned patient to chair, patient's O2 improved quickly with deep breathing and the SOB improved.  Patient remains on 2L at rest, but will need to increase with activity.

## 2022-12-16 NOTE — TOC Progression Note (Signed)
Transition of Care [facility]) - Progression Note    Patient Details  Name: Alice Donaldson MRN: [id number] Date of Birth: 01-03-1946  Transition of Care [facility]) CM/SW [place], [place] Phone Number: 12/16/2022, 9:31 AM  Clinical Narrative:     CSW spoke with Meg with [facility], she is able to accept patient for home health services.   Expected Discharge Plan: [place] Barriers to Discharge: Continued Medical Work up  Expected Discharge Plan and Services       Living arrangements for the past 2 months: Single Family Home                           HH Arranged: PT, OT, RN           Social Determinants of Health (SDOH) Interventions SDOH Screenings   Food Insecurity: No Food Insecurity (12/07/2022)  Housing: Low Risk  (12/07/2022)  Transportation Needs: No Transportation Needs (12/07/2022)  Utilities: Not At Risk (12/07/2022)  Depression (PHQ2-9): Low Risk  (01/02/2022)  Financial Resource Strain: Low Risk  (04/08/2021)  Tobacco Use: Medium Risk (12/05/2022)    Readmission Risk Interventions     No data to display

## 2022-12-16 NOTE — Care Management Important Message (Signed)
Important Message  Patient Details  Name: Doral PHILICIA HEYNE MRN: [id number] Date of Birth: 03/09/1946   Medicare Important Message Given:  Yes  Reviewed Medicare IM with patient via room phone [phone]).  Copy of Medicare IM sent securely via [employer].   Dannette Barbara 12/16/2022, 12:48 PM

## 2022-12-16 NOTE — TOC Transition Note (Signed)
Transition of Care [facility]) - CM/SW Discharge Note   Patient Details  Name: Alice Donaldson MRN: [id number] Date of Birth: June 27, 1946  [facility]) CM/SW Contact:  Tiburcio Bash, LCSW Phone Number: 12/16/2022, 1:28 PM   Clinical Narrative:     Patient to dc home with [facility] ordered through Terri Piedra with [facility] informed of dc. Patient qualifies for 2L O2, ordered via [employer].   No further dc needs identified at this time.    Final next level of care: Home w Home Health Services Barriers to Discharge: No Barriers Identified   Patient Goals and CMS Choice CMS Medicare.gov Compare Post Acute Care list provided to:: Patient Choice offered to / list presented to : Patient  Discharge Placement                         Discharge Plan and Services Additional resources added to the After Visit Summary for                  DME Arranged: (P) Oxygen DME Agency: (P) [facility]       HH Arranged: PT, OT, RN          Social Determinants of Health (SDOH) Interventions SDOH Screenings   Food Insecurity: No Food Insecurity (12/07/2022)  Housing: Low Risk  (12/07/2022)  Transportation Needs: No Transportation Needs (12/07/2022)  Utilities: Not At Risk (12/07/2022)  Depression (PHQ2-9): Low Risk  (01/02/2022)  Financial Resource Strain: Low Risk  (04/08/2021)  Tobacco Use: Medium Risk (12/05/2022)     Readmission Risk Interventions     No data to display

## 2022-12-16 NOTE — Discharge Summary (Signed)
Physician Discharge Summary   Patient: Alice Donaldson MRN: [id number] DOB: 07-09-1946  Admit date:     12/05/2022  Discharge date: 12/16/22  Discharge Physician: Tavian Callander Marry Guan   PCP: [name], MD   Recommendations at discharge:    Follow up with PCP in 2 weeks.  Discharge Diagnoses: Principal Problem:   Pneumonia due to COVID-19 virus Active Problems:   Acute respiratory failure with hypoxia (HCC)   Recurrent pulmonary emboli (HCC)   Spinal stenosis, lumbar region, with neurogenic claudication  Resolved Problems:   * No resolved hospital problems. [facility] Course: Alice Donaldson is a 77 y.o. female with PMH of HTN, HL, chronic PE and left leg DVT s/p 08/2020 thrombectomy on Eliquis, history of gastric ulcer, PVD who presented to the ED on 12/22 with acute hypoxia and dyspnea found to be COVID positive.  CXR shows significant bilateral opacities concerning for COVID-pneumonia versus superimposed bacterial pneumonia.  She was treated with baricitinib during her hospital stay, as well as IV Decadron and antibiotics for possible superimposed pneumonia.  CT of the chest was done which ruled out PE, her home Eliquis was continued.  Patient finished a course of antibiotics on 12/26, she is stable and improving overall, now down to 2L , she is on room air at baseline.  She was qualified for home oxygen today, she was stable on room air but desaturated with ambulation.  Home health has been arranged, including home oxygen.  She also completed a 10-day course of IV Decadron on 12/15/2022.  She will not need to continue her baricitinib and discharge.  Plan of care discussed in detail with the patient this morning, she is agreeable and feeling much better requesting discharge home from the hospital.  She will follow-up as an outpatient with her PCP within the next couple of weeks, will return to the ER or seek medical care for recurrent persistent fever, significant shortness of  breath.  Consultants: None  Disposition: Home health Diet recommendation:  Discharge Diet Orders (From admission, onward)     Start     Ordered   12/16/22 0000  Diet - low sodium heart healthy        12/16/22 1220           Regular diet DISCHARGE MEDICATION: Allergies as of 12/16/2022       Reactions   Prevpac [amoxicill-clarithro-lansopraz] Other (See Comments)   Throat closed up   Latex Rash   Had issues when wearing powdered gloves all day long while working.   Duloxetine Other (See Comments)   Nausea, diarrhea and jittery   Tramadol    GI symptoms        Medication List     STOP taking these medications    ciclopirox 8 % solution Commonly known as: PENLAC       TAKE these medications    acetaminophen 500 MG tablet Commonly known as: TYLENOL Take 1,000 mg by mouth every 8 (eight) hours as needed.   albuterol 108 (90 Base) MCG/ACT inhaler Commonly known as: ProAir HFA INHALE 2 PUFFS INTO THE LUNGS EVERY 6 (SIX) HOURS AS NEEDED FOR WHEEZING.   apixaban 5 MG Tabs tablet Commonly known as: Eliquis Take 1 tablet (5 mg total) by mouth 2 (two) times daily.   Biotin 5000 5 MG Caps Generic drug: Biotin Take by mouth.   cetirizine 10 MG tablet Commonly known as: ZYRTEC Take 10 mg by mouth at bedtime.   docusate sodium 100  MG capsule Commonly known as: COLACE Take 100 mg by mouth daily as needed.   etodolac 500 MG tablet Commonly known as: LODINE TAKE ONE TABLET BY MOUTH twice daily AS NEEDED   Glucosamine Chondroitin Joint Tabs Take 2 tablets by mouth daily.   LORazepam 0.5 MG tablet Commonly known as: ATIVAN TAKE 1/2 TO 1 TABLET BY MOUTH AT bedtime   niacin 500 MG tablet Commonly known as: VITAMIN B3 Take 500 mg by mouth at bedtime.   pantoprazole 40 MG tablet Commonly known as: PROTONIX TAKE ONE TABLET BY MOUTH TWICE DAILY. Needs appointment for further refills What changed: See the new instructions.   polyethylene glycol 17 g  packet Commonly known as: MIRALAX / GLYCOLAX Take 17 g by mouth daily as needed for moderate constipation.   SALONPAS PAIN RELIEF PATCH EX Apply topically. prn   tiZANidine 2 MG tablet Commonly known as: ZANAFLEX TAKE ONE TABLET BY MOUTH AT bedtime AS NEEDED FOR muscle SPASMS   Vitamin D3 25 MCG (1000 UT) Caps        Discharge Exam: Filed Weights   12/05/22 1412 12/07/22 0625  Weight: 107.2 kg 104.8 kg   Vitals reviewed.  Constitutional:      General: She is not in acute distress. Up in chair this AM on 2L looks well.    Appearance: She is well-developed. She is obese. She is not ill-appearing.  HENT:     Head: Normocephalic and atraumatic.  Eyes:     Extraocular Movements: Extraocular movements intact.     Pupils: Pupils are equal, round, and reactive to light.  Neck:     Thyroid: No thyromegaly.     Vascular: No JVD.  Cardiovascular:     Rate and Rhythm: Normal rate and regular rhythm.     Pulses: Normal pulses.     Heart sounds: Normal heart sounds.  Pulmonary:     Effort: Pulmonary effort is normal. No tachypnea, accessory muscle usage or respiratory distress.     Breath sounds: No wheezing or rhonchi.  Abdominal:     General: Bowel sounds are normal.     Palpations: Abdomen is soft.  Musculoskeletal:        General: Normal range of motion.     Cervical back: Normal range of motion and neck supple.  Skin:    General: Skin is warm and dry.  Neurological:     General: No focal deficit present.     Mental Status: She is alert.  Psychiatric:        Mood and Affect: Mood normal.   Condition at discharge: stable  The results of significant diagnostics from this hospitalization (including imaging, microbiology, ancillary and laboratory) are listed below for reference.   Imaging Studies: ECHOCARDIOGRAM COMPLETE  Result Date: 12/15/2022    ECHOCARDIOGRAM REPORT   Patient Name:   Alice Donaldson Date of Exam: 12/14/2022 Medical Rec #:  [id number]      Height:        68.0 in Accession #:    [id number]     Weight:       231.0 lb Date of Birth:  Jul 29, 1946      BSA:          2.173 m Patient Age:    66 years       BP:           142/86 mmHg Patient Gender: F              HR:  66 bpm. Exam Location:  [facility] Procedure: 2D Echo, Color Doppler, Cardiac Doppler and Intracardiac            Opacification Agent Indications:     R06.00 Dyspnea  History:         Patient has no prior history of Echocardiogram examinations.                  Risk Factors:Dyslipidemia. History of DVT; pt tested positive                  for COVID-19 on 12/05/22.  Sonographer:     Charmayne Sheer Referring Phys:  [id number] George Hugh Diagnosing Phys: Isaias Cowman MD  Sonographer Comments: Suboptimal apical window and no subcostal window. IMPRESSIONS  1. Left ventricular ejection fraction, by estimation, is 60 to 65%. The left ventricle has normal function. The left ventricle has no regional wall motion abnormalities. There is mild left ventricular hypertrophy. Left ventricular diastolic parameters were normal.  2. Right ventricular systolic function is normal. The right ventricular size is normal.  3. The mitral valve is normal in structure. Mild mitral valve regurgitation. No evidence of mitral stenosis.  4. Tricuspid valve regurgitation is mild to moderate.  5. The aortic valve is normal in structure. Aortic valve regurgitation is not visualized. No aortic stenosis is present.  6. The inferior vena cava is normal in size with greater than 50% respiratory variability, suggesting right atrial pressure of 3 mmHg. FINDINGS  Left Ventricle: Left ventricular ejection fraction, by estimation, is 60 to 65%. The left ventricle has normal function. The left ventricle has no regional wall motion abnormalities. Definity contrast agent was given IV to delineate the left ventricular  endocardial borders. The left ventricular internal cavity size was normal in size. There is mild left ventricular hypertrophy. Left  ventricular diastolic parameters were normal. Right Ventricle: The right ventricular size is normal. No increase in right ventricular wall thickness. Right ventricular systolic function is normal. Left Atrium: Left atrial size was normal in size. Right Atrium: Right atrial size was normal in size. Pericardium: There is no evidence of pericardial effusion. Mitral Valve: The mitral valve is normal in structure. Mild mitral valve regurgitation. No evidence of mitral valve stenosis. Tricuspid Valve: The tricuspid valve is normal in structure. Tricuspid valve regurgitation is mild to moderate. No evidence of tricuspid stenosis. Aortic Valve: The aortic valve is normal in structure. Aortic valve regurgitation is not visualized. No aortic stenosis is present. Aortic valve mean gradient measures 4.0 mmHg. Aortic valve peak gradient measures 8.0 mmHg. Aortic valve area, by VTI measures 2.32 cm. Pulmonic Valve: The pulmonic valve was normal in structure. Pulmonic valve regurgitation is not visualized. No evidence of pulmonic stenosis. Aorta: The aortic root is normal in size and structure. Venous: The inferior vena cava is normal in size with greater than 50% respiratory variability, suggesting right atrial pressure of 3 mmHg. IAS/Shunts: No atrial level shunt detected by color flow Doppler.  LEFT VENTRICLE PLAX 2D LVIDd:         3.80 cm   Diastology LVIDs:         2.20 cm   LV e' medial:    5.00 cm/s LV PW:         1.20 cm   LV E/e' medial:  16.5 LV IVS:        1.00 cm   LV e' lateral:   7.29 cm/s LVOT diam:     1.80 cm   LV E/e'  lateral: 11.3 LV SV:         54 LV SV Index:   25 LVOT Area:     2.54 cm  LEFT ATRIUM             Index LA diam:        2.30 cm 1.06 cm/m LA Vol (A2C):   39.3 ml 18.09 ml/m LA Vol (A4C):   71.5 ml 32.91 ml/m LA Biplane Vol: 54.9 ml 25.27 ml/m  AORTIC VALVE                    PULMONIC VALVE AV Area (Vmax):    2.26 cm     PV Vmax:       1.02 m/s AV Area (Vmean):   2.19 cm     PV Vmean:       77.900 cm/s AV Area (VTI):     2.32 cm     PV VTI:        0.201 m AV Vmax:           141.00 cm/s  PV Peak grad:  4.2 mmHg AV Vmean:          93.100 cm/s  PV Mean grad:  3.0 mmHg AV VTI:            0.235 m AV Peak Grad:      8.0 mmHg AV Mean Grad:      4.0 mmHg LVOT Vmax:         125.00 cm/s LVOT Vmean:        80.300 cm/s LVOT VTI:          0.214 m LVOT/AV VTI ratio: 0.91  AORTA Ao Root diam: 2.80 cm MITRAL VALVE               TRICUSPID VALVE MV Area (PHT): 3.03 cm    TR Peak grad:   36.7 mmHg MV Decel Time: 250 msec    TR Vmax:        303.00 cm/s MV E velocity: 82.30 cm/s MV A velocity: 86.50 cm/s  SHUNTS MV E/A ratio:  0.95        Systemic VTI:  0.21 m                            Systemic Diam: 1.80 cm Isaias Cowman MD Electronically signed by Isaias Cowman MD Signature Date/Time: 12/15/2022/9:55:45 AM    Final    DG Chest Port 1 View  Result Date: 12/05/2022 CLINICAL DATA:  Shortness of breath with cough for 2 weeks. EXAM: PORTABLE CHEST 1 VIEW COMPARISON:  Radiographs 06/15/2018 and 02/08/2016.  CT 07/02/2022. FINDINGS: 1349 hours. The heart size and mediastinal contours are stable with a moderate-sized hiatal hernia. There are new patchy perihilar and lower lobe airspace opacities bilaterally suspicious for multilobar pneumonia. No pleural effusion or significant pleural effusion is identified. No acute osseous findings are demonstrated. There is a mild thoracolumbar scoliosis. Telemetry leads overlie the chest. IMPRESSION: New patchy perihilar and lower lobe airspace opacities bilaterally suspicious for multilobar pneumonia. Radiographic follow-up recommended to document clearing and exclude alternative etiologies. Electronically Signed   By: Richardean Sale M.D.   On: 12/05/2022 13:56    Microbiology: Results for orders placed or performed during the hospital encounter of 12/05/22  Resp panel by RT-PCR (RSV, Flu A&B, Covid) Anterior Nasal Swab     Status: Abnormal   Collection Time:  12/05/22  1:42 PM  Specimen: Anterior Nasal Swab  Result Value Ref Range Status   SARS Coronavirus 2 by RT PCR POSITIVE (A) NEGATIVE Final    Comment: (NOTE) SARS-CoV-2 target nucleic acids are DETECTED.  The SARS-CoV-2 RNA is generally detectable in upper respiratory specimens during the acute phase of infection. Positive results are indicative of the presence of the identified virus, but do not rule out bacterial infection or co-infection with other pathogens not detected by the test. Clinical correlation with patient history and other diagnostic information is necessary to determine patient infection status. The expected result is Negative.  Fact Sheet for Patients: EntrepreneurPulse.com.au  Fact Sheet for Healthcare Providers: IncredibleEmployment.be  This test is not yet approved or cleared by the [place] FDA and  has been authorized for detection and/or diagnosis of SARS-CoV-2 by FDA under an Emergency Use Authorization (EUA).  This EUA will remain in effect (meaning this test can be used) for the duration of  the COVID-19 declaration under Section 564(b)(1) of the A ct, 21 U.S.C. section 360bbb-3(b)(1), unless the authorization is terminated or revoked sooner.     Influenza A by PCR NEGATIVE NEGATIVE Final   Influenza B by PCR NEGATIVE NEGATIVE Final    Comment: (NOTE) The Xpert Xpress SARS-CoV-2/FLU/RSV plus assay is intended as an aid in the diagnosis of influenza from Nasopharyngeal swab specimens and should not be used as a sole basis for treatment. Nasal washings and aspirates are unacceptable for Xpert Xpress SARS-CoV-2/FLU/RSV testing.  Fact Sheet for Patients: EntrepreneurPulse.com.au  Fact Sheet for Healthcare Providers: IncredibleEmployment.be  This test is not yet approved or cleared by the [place] FDA and has been authorized for detection and/or diagnosis of SARS-CoV-2  by FDA under an Emergency Use Authorization (EUA). This EUA will remain in effect (meaning this test can be used) for the duration of the COVID-19 declaration under Section 564(b)(1) of the Act, 21 U.S.C. section 360bbb-3(b)(1), unless the authorization is terminated or revoked.     Resp Syncytial Virus by PCR NEGATIVE NEGATIVE Final    Comment: (NOTE) Fact Sheet for Patients: EntrepreneurPulse.com.au  Fact Sheet for Healthcare Providers: IncredibleEmployment.be  This test is not yet approved or cleared by the [place] FDA and has been authorized for detection and/or diagnosis of SARS-CoV-2 by FDA under an Emergency Use Authorization (EUA). This EUA will remain in effect (meaning this test can be used) for the duration of the COVID-19 declaration under Section 564(b)(1) of the Act, 21 U.S.C. section 360bbb-3(b)(1), unless the authorization is terminated or revoked.  Performed at [facility], [address] [place], [place] [address]     Labs: CBC: Recent Labs  Lab 12/14/22 0539  WBC 8.7  HGB 12.0  HCT 37.4  MCV 82.0  PLT 749*   Basic Metabolic Panel: Recent Labs  Lab 12/14/22 0539  NA 138  K 4.9  CL 103  CO2 29  GLUCOSE 119*  BUN 29*  CREATININE 0.69  CALCIUM 9.6   Liver Function Tests: Recent Labs  Lab 12/15/22 0620  AST 25  ALT 29  ALKPHOS 75  BILITOT 1.1  PROT 6.6  ALBUMIN 2.7*   CBG: No results for input(s): "GLUCAP" in the last 168 hours.  Discharge time spent: greater than 30 minutes.  Signed: Jo-Ann Johanning Marry Guan, MD [facility] 12/16/2022

## 2022-12-16 NOTE — Progress Notes (Signed)
SATURATION QUALIFICATIONS: (This note is used to comply with regulatory documentation for home oxygen)  Patient Saturations on Room Air at Rest = 92%  Patient Saturations on Room Air while Ambulating = 84%  Patient Saturations on 3 Liters of oxygen while Ambulating = 88%  Please briefly explain why patient needs home oxygen: Patient requires supplemental O2 to maintain spO2 >88% during short ambulation distance.    Daivion Pape A. Gilford Rile PT, DPT [facility] - Acute Rehabilitation Services

## 2022-12-16 NOTE — Progress Notes (Signed)
Patient/family asking about how long she needs to be on covid restrictions. Patient is on day 10 with improved symptoms.   Called IP and left message, not available today.

## 2022-12-17 ENCOUNTER — Telehealth: Payer: Self-pay

## 2022-12-17 DIAGNOSIS — Z9981 Dependence on supplemental oxygen: Secondary | ICD-10-CM | POA: Diagnosis not present

## 2022-12-17 DIAGNOSIS — I82402 Acute embolism and thrombosis of unspecified deep veins of left lower extremity: Secondary | ICD-10-CM | POA: Diagnosis not present

## 2022-12-17 DIAGNOSIS — I739 Peripheral vascular disease, unspecified: Secondary | ICD-10-CM | POA: Diagnosis not present

## 2022-12-17 DIAGNOSIS — I10 Essential (primary) hypertension: Secondary | ICD-10-CM | POA: Diagnosis not present

## 2022-12-17 DIAGNOSIS — Z7901 Long term (current) use of anticoagulants: Secondary | ICD-10-CM | POA: Diagnosis not present

## 2022-12-17 DIAGNOSIS — I2782 Chronic pulmonary embolism: Secondary | ICD-10-CM | POA: Diagnosis not present

## 2022-12-17 DIAGNOSIS — J1282 Pneumonia due to coronavirus disease 2019: Secondary | ICD-10-CM | POA: Diagnosis not present

## 2022-12-17 DIAGNOSIS — E785 Hyperlipidemia, unspecified: Secondary | ICD-10-CM | POA: Diagnosis not present

## 2022-12-17 DIAGNOSIS — M48062 Spinal stenosis, lumbar region with neurogenic claudication: Secondary | ICD-10-CM | POA: Diagnosis not present

## 2022-12-17 DIAGNOSIS — U071 COVID-19: Secondary | ICD-10-CM | POA: Diagnosis not present

## 2022-12-17 NOTE — Progress Notes (Addendum)
Care Management & Coordination Services Pharmacy Team  Reason for Encounter: ED/Hospital Chart notification  Patient is actively enrolled in Care Management and Care Coordination with PharmD: Yes  Reviewed hospital notes for details of recent visit.  Admitted to the hospital on 12/05/2022. Discharge date was 12/16/2022.  Discharged from [facility].   Discharge diagnosis (Principal Problem): Pneumonia due to COVID-19 virus  Patient was discharged to Home  Brief summary of hospital course: Alice Donaldson is a 77 y.o. female with PMH of HTN, HL, chronic PE and left leg DVT s/p 08/2020 thrombectomy on Eliquis, history of gastric ulcer, PVD who presented to the ED on 12/22 with acute hypoxia and dyspnea found to be COVID positive.  CXR shows significant bilateral opacities concerning for COVID-pneumonia versus superimposed bacterial pneumonia.  She was treated with baricitinib during her hospital stay, as well as IV Decadron and antibiotics for possible superimposed pneumonia.  CT of the chest was done which ruled out PE, her home Eliquis was continued.  Patient finished a course of antibiotics on 12/26, she is stable and improving overall, now down to 2L [place], she is on room air at baseline.  She was qualified for home oxygen today, she was stable on room air but desaturated with ambulation.  Home health has been arranged, including home oxygen.  She also completed a 10-day course of IV Decadron on 12/15/2022.  She will not need to continue her baricitinib and discharge.  Plan of care discussed in detail with the patient this morning, she is agreeable and feeling much better requesting discharge home from the hospital.  She will follow-up as an outpatient with her PCP within the next couple of weeks, will return to the ER or seek medical care for recurrent persistent fever, significant shortness of breath.   New?Medications Started at [facility] Discharge:?? None noted  Medication Changes at  Hospital Discharge: None noted  Medications Discontinued at Hospital Discharge: -Stopped ciclopirox 8 % solution [facility])  Medications that remain the same after Hospital Discharge:??  -All other medications will remain the same.    Has patient been contacted by Transitions of Care team? No Has patient seen PCP/specialist for hospital follow up (summarize OV if yes): No  Other upcoming appts: PCP appointment on 01/06/2023 for physical  Charlene Brooke, PharmD notified and will determine if action is needed.  [name], CMA   Pharmacist addendum: Pt has spoken with primary team via mychart and has appt scheduled. No PharmD intervention required.  Charlene Brooke, PharmD, BCACP 12/24/22 9:22 AM

## 2022-12-17 NOTE — Telephone Encounter (Signed)
Called and left a message for pt to let her know that we can use her 01-06-23 AWV as a hospital follow-up. Advised if she was not doing well to call and get a sooner appointment.

## 2022-12-18 DIAGNOSIS — J9610 Chronic respiratory failure, unspecified whether with hypoxia or hypercapnia: Secondary | ICD-10-CM | POA: Diagnosis not present

## 2022-12-18 DIAGNOSIS — R0902 Hypoxemia: Secondary | ICD-10-CM | POA: Diagnosis not present

## 2022-12-18 DIAGNOSIS — U071 COVID-19: Secondary | ICD-10-CM | POA: Diagnosis not present

## 2022-12-19 ENCOUNTER — Encounter: Payer: Self-pay | Admitting: Internal Medicine

## 2022-12-19 MED ORDER — FLUTICASONE PROPIONATE 50 MCG/ACT NA SUSP: 2.0000 | Freq: Every day | NASAL | 3 refills | Status: DC

## 2022-12-22 ENCOUNTER — Telehealth: Payer: Self-pay | Admitting: Internal Medicine

## 2022-12-22 ENCOUNTER — Encounter: Payer: Self-pay | Admitting: Internal Medicine

## 2022-12-22 NOTE — Telephone Encounter (Signed)
Left detailed message on verified VM. 

## 2022-12-22 NOTE — Telephone Encounter (Signed)
Home Health verbal orders Caller Name:Emily Agency Name: [facility]  Callback number: [phone]  Requesting OT/PT/Skilled nursing/Social Work/Speech: Pt  Reason: discharged for hospital  for covid an pneumonia   Frequency:2wks 3 1 wk 1  Please forward to [facility] pool or providers CMA

## 2023-01-01 ENCOUNTER — Encounter: Payer: Self-pay | Admitting: Internal Medicine

## 2023-01-06 ENCOUNTER — Ambulatory Visit (INDEPENDENT_AMBULATORY_CARE_PROVIDER_SITE_OTHER): Payer: PPO | Admitting: Internal Medicine

## 2023-01-06 ENCOUNTER — Encounter: Payer: Self-pay | Admitting: Internal Medicine

## 2023-01-06 VITALS — BP 134/88 | HR 62 | Temp 97.7°F | Ht 64.0 in | Wt 236.0 lb

## 2023-01-06 DIAGNOSIS — D508 Other iron deficiency anemias: Secondary | ICD-10-CM | POA: Diagnosis not present

## 2023-01-06 DIAGNOSIS — F39 Unspecified mood [affective] disorder: Secondary | ICD-10-CM | POA: Diagnosis not present

## 2023-01-06 DIAGNOSIS — Z0000 Encounter for general adult medical examination without abnormal findings: Secondary | ICD-10-CM | POA: Diagnosis not present

## 2023-01-06 DIAGNOSIS — I700 Atherosclerosis of aorta: Secondary | ICD-10-CM | POA: Diagnosis not present

## 2023-01-06 DIAGNOSIS — I2699 Other pulmonary embolism without acute cor pulmonale: Secondary | ICD-10-CM

## 2023-01-06 DIAGNOSIS — J9611 Chronic respiratory failure with hypoxia: Secondary | ICD-10-CM | POA: Diagnosis not present

## 2023-01-06 NOTE — Assessment & Plan Note (Signed)
On imaging No statin for now

## 2023-01-06 NOTE — Assessment & Plan Note (Signed)
I have personally reviewed the Medicare Annual Wellness questionnaire and have noted 1. The patient's medical and social history 2. Their use of alcohol, tobacco or illicit drugs 3. Their current medications and supplements 4. The patient's functional ability including ADL's, fall risks, home safety risks and hearing or visual             impairment. 5. Diet and physical activities 6. Evidence for depression or mood disorders  The patients weight, height, BMI and visual acuity have been recorded in the chart I have made referrals, counseling and provided education to the patient based review of the above and I have provided the pt with a written personalized care plan for preventive services.  I have provided you with a copy of your personalized plan for preventive services. Please take the time to review along with your updated medication list.  Due for last screening colon later this year Mammogram every other year till 66 Needs to do some exercise--even just chair exercise Td at pharmacy Flu and updated COVID vaccine this fall

## 2023-01-06 NOTE — Assessment & Plan Note (Signed)
Ongoing hypoxemia --needs the ongoing oxygen Hopefully can wean over time Still working on PT

## 2023-01-06 NOTE — Assessment & Plan Note (Signed)
Hasn't needed iron infusions since starting red wine

## 2023-01-06 NOTE — Progress Notes (Signed)
Hearing Screening - Comments:: Some hearing loss possibly from congestion. Vision Screening - Comments:: July 2023

## 2023-01-06 NOTE — Assessment & Plan Note (Signed)
BMI stable Discussed exercise, etc

## 2023-01-06 NOTE — Assessment & Plan Note (Signed)
No recurrence even with COVID--since on eliquis 5 bid

## 2023-01-06 NOTE — Assessment & Plan Note (Signed)
Mostly reactive depression  Meds not indicated Just using lorazepam at bedtime now

## 2023-01-06 NOTE — Progress Notes (Signed)
Subjective:    Patient ID: Alice Donaldson, female    DOB: Apr 02, 1946, 77 y.o.   MRN: [id number]  HPI Here for Medicare wellness visit and hospital follow up Reviewed advanced directives Reviewed other doctors---Dr Porfilio--ophthal, Dr Candie Chroman, Mr [facility], Dr Dew--vascular, dentist?, Dr Kim--derm (not going back to her) Had cataract surgery 4/23 on right. Then needed 2 laser procedures after that COVID pneumonia in December Vision good now Hearing is not great--still feels congested from the [place] Not really exercising---discussed Having regular red wine now No tobacco No falls Reactive mood issues with illness Has been independent with instrumental ADLs Some memory issues at first with COVID--word recall, etc  Hospitalization last month for COVID Still on oxygen---does poorly with any walking when off it Still getting PT--and they note desaturation with activity She is noticing increased back and other pain with the therapy Cough is mostly better Not sleeping well---needs the lorazepam (since COVID) Now with fairly regular headaches  No recurrence of pulmonary embolus with COVID Still has left leg pain--post thrombotic (but may also be from fall on left knee) Steroid injections didn't help the knee though  Continues on pantoprazole No heartburn on this--no dysphagia  Continues with Dr Grayland Ormond Has had some iron infusions in the past--but not since starting some red wine  No regular depression Has some down days--but not persistent Oxygen need and illness mostly the cause  BMI stable at 40 Appetite is okay  Current Outpatient Medications on File Prior to Visit  Medication Sig Dispense Refill   acetaminophen (TYLENOL) 500 MG tablet Take 1,000 mg by mouth every 8 (eight) hours as needed.      albuterol (PROAIR HFA) 108 (90 Base) MCG/ACT inhaler INHALE 2 PUFFS INTO THE LUNGS EVERY 6 (SIX) HOURS AS NEEDED FOR WHEEZING. 8.5 each 1   apixaban (ELIQUIS) 5  MG TABS tablet Take 1 tablet (5 mg total) by mouth 2 (two) times daily. 180 tablet 2   Biotin (BIOTIN 5000) 5 MG CAPS Take by mouth.     cetirizine (ZYRTEC) 10 MG tablet Take 10 mg by mouth at bedtime.      Cholecalciferol (VITAMIN D3) 25 MCG (1000 UT) CAPS      docusate sodium (COLACE) 100 MG capsule Take 100 mg by mouth daily as needed.      etodolac (LODINE) 500 MG tablet TAKE ONE TABLET BY MOUTH twice daily AS NEEDED 180 tablet 0   fluticasone (FLONASE) 50 MCG/ACT nasal spray Place 2 sprays into both nostrils daily. 48 g 3   Glucos-Chondroit-Hyaluron-MSM (GLUCOSAMINE CHONDROITIN JOINT) TABS Take 2 tablets by mouth daily.     LORazepam (ATIVAN) 0.5 MG tablet TAKE 1/2 TO 1 TABLET BY MOUTH AT bedtime 30 tablet 0   Menthol-Methyl Salicylate (SALONPAS PAIN RELIEF PATCH EX) Apply topically. prn     niacin (VITAMIN B3) 500 MG tablet Take 500 mg by mouth at bedtime.     pantoprazole (PROTONIX) 40 MG tablet TAKE ONE TABLET BY MOUTH TWICE DAILY. Needs appointment for further refills (Patient taking differently: Take 40 mg by mouth 2 (two) times daily.) 180 tablet 3   polyethylene glycol (MIRALAX / GLYCOLAX) packet Take 17 g by mouth daily as needed for moderate constipation.      tiZANidine (ZANAFLEX) 2 MG tablet TAKE ONE TABLET BY MOUTH AT bedtime AS NEEDED FOR muscle SPASMS 90 tablet 0   No current facility-administered medications on file prior to visit.    Allergies  Allergen Reactions   Prevpac [Amoxicill-Clarithro-Lansopraz] Other (  See Comments)    Throat closed up   Latex Rash    Had issues when wearing powdered gloves all day long while working.   Duloxetine Other (See Comments)    Nausea, diarrhea and jittery   Tramadol     GI symptoms    Past Medical History:  Diagnosis Date   Allergic rhinitis, cause unspecified    Allergy    Anemia    Complication of anesthesia    Diverticulosis    DVT (deep venous thrombosis) ([place])    x2.  Last in 9/21   Gastric ulcer    in past   GERD  (gastroesophageal reflux disease)    Hemorrhoids, internal    Hyperlipidemia    Motion sickness    ocean ships   Osteoarthrosis involving, or with mention of more than one site, but not specified as generalized, multiple sites    PONV (postoperative nausea and vomiting)    Ulcer (traumatic) of oral mucosa    Unspecified venous (peripheral) insufficiency    Vertigo    No episodes over 10 years    Past Surgical History:  Procedure Laterality Date   ABDOMINAL HYSTERECTOMY  1996   BREAST BIOPSY  2003   CATARACT EXTRACTION W/PHACO Right 03/25/2022   Procedure: CATARACT EXTRACTION PHACO AND INTRAOCULAR LENS PLACEMENT ([place]) RIGHT  VIVITY LENS 22.90 01:57.1;  Surgeon: Birder Robson, MD;  Location: [facility];  Service: Ophthalmology;  Laterality: Right;  Latex   CHOLECYSTECTOMY  2000   COLONOSCOPY WITH PROPOFOL N/A 10/02/2015   Procedure: COLONOSCOPY WITH PROPOFOL;  Surgeon: Lollie Sails, MD;  Location: [facility] ENDOSCOPY;  Service: Endoscopy;  Laterality: N/A;   COLONOSCOPY WITH PROPOFOL N/A 06/17/2018   Procedure: COLONOSCOPY WITH PROPOFOL;  Surgeon: Toledo, Benay Pike, MD;  Location: [facility] ENDOSCOPY;  Service: Gastroenterology;  Laterality: N/A;   DEXA  5/14   Normal   ESOPHAGOGASTRODUODENOSCOPY (EGD) WITH PROPOFOL N/A 06/17/2018   Procedure: ESOPHAGOGASTRODUODENOSCOPY (EGD) WITH PROPOFOL;  Surgeon: Toledo, Benay Pike, MD;  Location: [facility] ENDOSCOPY;  Service: Gastroenterology;  Laterality: N/A;   KNEE ARTHROSCOPY     right in 2007, left in 2011   PERIPHERAL VASCULAR THROMBECTOMY Left 08/27/2020   Procedure: PERIPHERAL VASCULAR THROMBECTOMY;  Surgeon: Algernon Huxley, MD;  Location: [facility];  Service: Cardiovascular;  Laterality: Left;   TONSILLECTOMY AND ADENOIDECTOMY  childhood    Family History  Problem Relation Age of Onset   Heart disease Mother    Heart disease Father    Diabetes Son 5       Type 1   Hypertension Son    Stroke Brother    Diabetes Other 2        type 1   Hyperlipidemia Neg Hx    Depression Neg Hx     Social History   Socioeconomic History   Marital status: Widowed    Spouse name: Not on file   Number of children: 1   Years of education: Not on file   Highest education level: Not on file  Occupational History   Occupation: Copywriter, advertising    Comment: retired  Tobacco Use   Smoking status: Former    Types: Cigarettes    Quit date: 1990    Years since quitting: 34.0    Passive exposure: Never   Smokeless tobacco: Never  Vaping Use   Vaping Use: Never used  Substance and Sexual Activity   Alcohol use: Yes    Alcohol/week: 2.0 standard drinks of alcohol  Types: 2 Glasses of wine per week    Comment: glass of wine 2x/wk   Drug use: No   Sexual activity: Not on file  Other Topics Concern   Not on file  Social History Narrative   Husband died 03-26-23   Has living will   Asks for [name] to make decisions   Would accept resuscitation--but no prolonged ventilation   Doesn't want tube feeds if cognitively unaware   Social Determinants of Health   Financial Resource Strain: Low Risk  (04/08/2021)   Overall Financial Resource Strain (CARDIA)    Difficulty of Paying Living Expenses: Not very hard  Food Insecurity: No Food Insecurity (12/07/2022)   Hunger Vital Sign    Worried About Running Out of Food in the Last Year: Never true    Ran Out of Food in the Last Year: Never true  Transportation Needs: No Transportation Needs (12/07/2022)   PRAPARE - Hydrologist (Medical): No    Lack of Transportation (Non-Medical): No  Physical Activity: Not on file  Stress: Not on file  Social Connections: Not on file  Intimate Partner Violence: Not At Risk (12/07/2022)   Humiliation, Afraid, Rape, and Kick questionnaire    Fear of Current or Ex-Partner: No    Emotionally Abused: No    Physically Abused: No    Sexually Abused: No   Review of Systems Not sleeping well now Wears seat belt Teeth  okay--due to see dentist Known sun damage on back---will need to go to a different dermatologist Bowels are fine--no blood No chest pain--but feels pain in "lungs" No syncope. Gets some dizziness with the head congestion    Objective:   Physical Exam Constitutional:      Appearance: Normal appearance. She is obese.  HENT:     Mouth/Throat:     Comments: No lesions Eyes:     Conjunctiva/sclera: Conjunctivae normal.     Pupils: Pupils are equal, round, and reactive to light.  Cardiovascular:     Rate and Rhythm: Normal rate and regular rhythm.     Pulses: Normal pulses.     Heart sounds: No murmur heard.    No gallop.  Pulmonary:     Effort: Pulmonary effort is normal.     Breath sounds: No wheezing or rales.     Comments: Decreased breath sounds but clear Abdominal:     Palpations: Abdomen is soft.     Tenderness: There is no abdominal tenderness.  Musculoskeletal:     Cervical back: Neck supple.  Lymphadenopathy:     Cervical: No cervical adenopathy.  Skin:    Findings: No lesion or rash.  Neurological:     General: No focal deficit present.     Mental Status: She is alert and oriented to person, place, and time.     Comments: Word naming--- 12/1 minute Recall--2/3 (then 3rd with hint)  Psychiatric:        Mood and Affect: Mood normal.        Behavior: Behavior normal.            Assessment & Plan:

## 2023-01-12 ENCOUNTER — Other Ambulatory Visit: Payer: Self-pay | Admitting: Internal Medicine

## 2023-01-12 NOTE — Telephone Encounter (Signed)
Last office visit 01/06/23 for CPE.  Last refilled 11/10/22 for #30 with no refills.  Next Appt: 04/07/23 for 3 month follow up.

## 2023-01-14 ENCOUNTER — Telehealth: Payer: Self-pay | Admitting: Internal Medicine

## 2023-01-14 NOTE — Telephone Encounter (Signed)
Verbal orders left on verified VM for Alice Donaldson.

## 2023-01-14 NOTE — Telephone Encounter (Signed)
Home Health verbal orders [place] Agency Name: [facility]  Callback number:   Requesting OT/PT/Skilled nursing/Social Work/Speech:PT extended  Reason: respiratory  issue from covid,strength  Frequency: 1 wk for 4 wk  Please forward to [facility] pool or providers CMA

## 2023-01-15 DIAGNOSIS — I2782 Chronic pulmonary embolism: Secondary | ICD-10-CM | POA: Diagnosis not present

## 2023-01-15 DIAGNOSIS — U071 COVID-19: Secondary | ICD-10-CM | POA: Diagnosis not present

## 2023-01-15 DIAGNOSIS — E785 Hyperlipidemia, unspecified: Secondary | ICD-10-CM | POA: Diagnosis not present

## 2023-01-15 DIAGNOSIS — Z9981 Dependence on supplemental oxygen: Secondary | ICD-10-CM | POA: Diagnosis not present

## 2023-01-15 DIAGNOSIS — M48062 Spinal stenosis, lumbar region with neurogenic claudication: Secondary | ICD-10-CM | POA: Diagnosis not present

## 2023-01-15 DIAGNOSIS — J1282 Pneumonia due to coronavirus disease 2019: Secondary | ICD-10-CM | POA: Diagnosis not present

## 2023-01-15 DIAGNOSIS — Z7901 Long term (current) use of anticoagulants: Secondary | ICD-10-CM | POA: Diagnosis not present

## 2023-01-15 DIAGNOSIS — I739 Peripheral vascular disease, unspecified: Secondary | ICD-10-CM | POA: Diagnosis not present

## 2023-01-15 DIAGNOSIS — I10 Essential (primary) hypertension: Secondary | ICD-10-CM | POA: Diagnosis not present

## 2023-01-15 DIAGNOSIS — I82402 Acute embolism and thrombosis of unspecified deep veins of left lower extremity: Secondary | ICD-10-CM | POA: Diagnosis not present

## 2023-01-18 DIAGNOSIS — R0902 Hypoxemia: Secondary | ICD-10-CM | POA: Diagnosis not present

## 2023-01-18 DIAGNOSIS — U071 COVID-19: Secondary | ICD-10-CM | POA: Diagnosis not present

## 2023-01-18 DIAGNOSIS — J9610 Chronic respiratory failure, unspecified whether with hypoxia or hypercapnia: Secondary | ICD-10-CM | POA: Diagnosis not present

## 2023-01-30 ENCOUNTER — Telehealth: Payer: Self-pay | Admitting: Internal Medicine

## 2023-01-30 NOTE — Telephone Encounter (Signed)
Alice Donaldson  from [facility] called in to ask approval to move patients visit form this week to next week,per request of patient? Patient has family in town.

## 2023-01-30 NOTE — Telephone Encounter (Signed)
Verbal consent given to [facility].

## 2023-01-31 ENCOUNTER — Other Ambulatory Visit: Payer: Self-pay | Admitting: Internal Medicine

## 2023-02-03 ENCOUNTER — Telehealth: Payer: Self-pay

## 2023-02-03 NOTE — Progress Notes (Unsigned)
Care Management & Coordination Services Pharmacy Team  Reason for Encounter: Medication coordination and delivery  Contacted patient to discuss medications and coordinate delivery from [facility].  Cycle dispensing form sent to [facility] for review.   Last adherence delivery date: 11/17/2022      Patient is due for next adherence delivery on: 02/13/2023  This delivery to include: Vials  90 Days  Packs: No Packs   VIAL medications:   Etodolac 500 mg- 1 tablet twice daily (1 morning and 1 evening) - 50 tablets remaining Lorazepam 0.5 mg - 0.5 - 1 tablet daily (bedtime) -  2 1/2 bottles remaining Tizanidine 2 mg- 1 capsule 3 times daily (PRN - only 1 at bedtime)  Pantoprazole 40 mg - 1 tab every morning and evening   Will continue to buy OTC Biotin 5000 mcg- 1 tablet daily  Vitamin D 1000 Units Glucosamine Chondroitin Joint tablets Cetirizine 75m Niacin 500 mg   Refills requested from providers include: Etodolac 500 mg- 1 tablet twice daily (1 morning and 1 evening) - 50 tablets remaining Tizanidine 2 mg- 1 capsule 3 times daily (PRN - only 1 at bedtime)   Confirmed delivery date of 02/13/2023, advised patient that pharmacy will contact them the morning of delivery.   Any concerns about your medications? No  How often do you forget or accidentally miss a dose? Never  Do you use a pillbox? Yes  Is patient in packaging No  Recent blood pressure readings are as follows:  Patient does not have a blood pressure cuff at home.   Chart review: Recent office visits:  01/06/23 AWV F/U 3 months  Recent consult visits:  None since last contact  Hospital visits:  None in previous 6 months  Medications: Outpatient Encounter Medications as of 02/03/2023  Medication Sig   acetaminophen (TYLENOL) 500 MG tablet Take 1,000 mg by mouth every 8 (eight) hours as needed.    albuterol (PROAIR HFA) 108 (90 Base) MCG/ACT inhaler INHALE 2 PUFFS INTO THE LUNGS EVERY 6 (SIX)  HOURS AS NEEDED FOR WHEEZING.   apixaban (ELIQUIS) 5 MG TABS tablet Take 1 tablet (5 mg total) by mouth 2 (two) times daily.   Biotin (BIOTIN 5000) 5 MG CAPS Take by mouth.   cetirizine (ZYRTEC) 10 MG tablet Take 10 mg by mouth at bedtime.    Cholecalciferol (VITAMIN D3) 25 MCG (1000 UT) CAPS    docusate sodium (COLACE) 100 MG capsule Take 100 mg by mouth daily as needed.    etodolac (LODINE) 500 MG tablet TAKE ONE TABLET BY MOUTH twice daily AS NEEDED   fluticasone (FLONASE) 50 MCG/ACT nasal spray Place 2 sprays into both nostrils daily.   Glucos-Chondroit-Hyaluron-MSM (GLUCOSAMINE CHONDROITIN JOINT) TABS Take 2 tablets by mouth daily.   LORazepam (ATIVAN) 0.5 MG tablet TAKE ONE-HALF TO 1 TABLET BY MOUTH EVERYDAY AT BEDTIME   Menthol-Methyl Salicylate (SALONPAS PAIN RELIEF PATCH EX) Apply topically. prn   niacin (VITAMIN B3) 500 MG tablet Take 500 mg by mouth at bedtime.   pantoprazole (PROTONIX) 40 MG tablet TAKE ONE TABLET BY MOUTH TWICE DAILY. Needs appointment for further refills (Patient taking differently: Take 40 mg by mouth 2 (two) times daily.)   polyethylene glycol (MIRALAX / GLYCOLAX) packet Take 17 g by mouth daily as needed for moderate constipation.    tiZANidine (ZANAFLEX) 2 MG tablet TAKE ONE TABLET BY MOUTH AT bedtime AS NEEDED FOR muscle SPASMS   No facility-administered encounter medications on file as of 02/03/2023.   BP  Readings from Last 3 Encounters:  01/06/23 134/88  12/16/22 118/65  12/05/22 (!) 152/96    Pulse Readings from Last 3 Encounters:  01/06/23 62  12/16/22 69  12/05/22 88    Lab Results  Component Value Date/Time   HGBA1C 6.1 04/18/2015 11:50 AM   Lab Results  Component Value Date   CREATININE 0.69 12/14/2022   BUN 29 (H) 12/14/2022   GFR 53.93 (L) 08/02/2020   GFRNONAA >60 12/14/2022   GFRAA >60 08/27/2020   NA 138 12/14/2022   K 4.9 12/14/2022   CALCIUM 9.6 12/14/2022   CO2 29 12/14/2022   Charlene Brooke, PharmD notified  [name],  [place] Pharmacy Assistant [phone]

## 2023-02-13 ENCOUNTER — Encounter: Payer: Self-pay | Admitting: Internal Medicine

## 2023-02-13 NOTE — Telephone Encounter (Signed)
I spoke with pt; pt said that with it being the anniversary of Bob's passing and feeling extremely tired post covid recently pt has found herself crying to a lot of different things. Pt is requesting a med for anxiety to take for few weeks. Pt had medicare wellness on 01/06/23 and understands that Dr Silvio Pate is out of office and will review his desktop periodically. Pt has no SI/HI and would like to wait until first of next wk to see if Dr Silvio Pate would send an anxiety med to [employer]. Pt will cb first of week if has not heard from pharmacy or my chart note from our office after Dr Silvio Pate has responded to this note. Pt said she is not driving yet so has to depend on someone else to take her to and from appts. UC & ED precautions given and pt voiced understanding.Sending note to Dr Silvio Pate and Silvio Pate pool.

## 2023-02-13 NOTE — Telephone Encounter (Signed)
I would need to see the patient in office

## 2023-02-16 DIAGNOSIS — R0902 Hypoxemia: Secondary | ICD-10-CM | POA: Diagnosis not present

## 2023-02-16 DIAGNOSIS — J9610 Chronic respiratory failure, unspecified whether with hypoxia or hypercapnia: Secondary | ICD-10-CM | POA: Diagnosis not present

## 2023-02-16 DIAGNOSIS — U071 COVID-19: Secondary | ICD-10-CM | POA: Diagnosis not present

## 2023-02-16 MED ORDER — ALPRAZOLAM 0.25 MG PO TABS: 0.2500 mg | ORAL_TABLET | Freq: Two times a day (BID) | ORAL | 0 refills | Status: DC | PRN

## 2023-02-16 NOTE — Telephone Encounter (Signed)
Spoke to pt. She wants to to try the alprazolam first. I have called that in to [facility].

## 2023-02-27 DIAGNOSIS — J189 Pneumonia, unspecified organism: Secondary | ICD-10-CM

## 2023-03-01 ENCOUNTER — Encounter: Payer: Self-pay | Admitting: Internal Medicine

## 2023-03-19 DIAGNOSIS — U071 COVID-19: Secondary | ICD-10-CM | POA: Diagnosis not present

## 2023-03-19 DIAGNOSIS — J9610 Chronic respiratory failure, unspecified whether with hypoxia or hypercapnia: Secondary | ICD-10-CM | POA: Diagnosis not present

## 2023-03-19 DIAGNOSIS — R0902 Hypoxemia: Secondary | ICD-10-CM | POA: Diagnosis not present

## 2023-04-07 ENCOUNTER — Ambulatory Visit: Payer: PPO | Admitting: Internal Medicine

## 2023-04-09 ENCOUNTER — Encounter: Payer: Self-pay | Admitting: Internal Medicine

## 2023-04-09 ENCOUNTER — Encounter: Payer: Self-pay | Admitting: Oncology

## 2023-04-09 ENCOUNTER — Ambulatory Visit (INDEPENDENT_AMBULATORY_CARE_PROVIDER_SITE_OTHER): Payer: PPO | Admitting: Internal Medicine

## 2023-04-09 VITALS — BP 122/80 | HR 80 | Temp 97.6°F | Ht 64.0 in

## 2023-04-09 DIAGNOSIS — J9611 Chronic respiratory failure with hypoxia: Secondary | ICD-10-CM | POA: Diagnosis not present

## 2023-04-09 DIAGNOSIS — I2699 Other pulmonary embolism without acute cor pulmonale: Secondary | ICD-10-CM | POA: Diagnosis not present

## 2023-04-09 NOTE — Progress Notes (Signed)
Subjective:    Patient ID: Alice Donaldson, female    DOB: Jun 13, 1946, 77 y.o.   MRN: [id number]  HPI Here for follow up of hypoxemic respiratory failure  Generally hasn't been able to get out of house--since she can't carry her tank She only goes through drive threws Got really dyspneic after walking in here without oxygen Still uses the oxygen prn Tries to use at night--but the canula always pulls out  Will be SOB if bends forward If off the oxygen for several hours--will have headache  Uses albuterol at times--does help some  Current Outpatient Medications on File Prior to Visit  Medication Sig Dispense Refill   acetaminophen (TYLENOL) 500 MG tablet Take 1,000 mg by mouth every 8 (eight) hours as needed.      albuterol (PROAIR HFA) 108 (90 Base) MCG/ACT inhaler INHALE 2 PUFFS INTO THE LUNGS EVERY 6 (SIX) HOURS AS NEEDED FOR WHEEZING. 8.5 each 1   ALPRAZolam (XANAX) 0.25 MG tablet Take 1 tablet (0.25 mg total) by mouth 2 (two) times daily as needed for anxiety. 20 tablet 0   apixaban (ELIQUIS) 5 MG TABS tablet Take 1 tablet (5 mg total) by mouth 2 (two) times daily. 180 tablet 2   Biotin (BIOTIN 5000) 5 MG CAPS Take by mouth.     cetirizine (ZYRTEC) 10 MG tablet Take 10 mg by mouth at bedtime.      Cholecalciferol (VITAMIN D3) 25 MCG (1000 UT) CAPS      docusate sodium (COLACE) 100 MG capsule Take 100 mg by mouth daily as needed.      etodolac (LODINE) 500 MG tablet TAKE ONE TABLET BY MOUTH TWICE DAILY AS NEEDED 180 tablet 0   fluticasone (FLONASE) 50 MCG/ACT nasal spray Place 2 sprays into both nostrils daily. 48 g 3   Glucos-Chondroit-Hyaluron-MSM (GLUCOSAMINE CHONDROITIN JOINT) TABS Take 2 tablets by mouth daily.     LORazepam (ATIVAN) 0.5 MG tablet TAKE ONE-HALF TO 1 TABLET BY MOUTH EVERYDAY AT BEDTIME 30 tablet 0   Menthol-Methyl Salicylate (SALONPAS PAIN RELIEF PATCH EX) Apply topically. prn     niacin (VITAMIN B3) 500 MG tablet Take 500 mg by mouth at bedtime.      pantoprazole (PROTONIX) 40 MG tablet TAKE ONE TABLET BY MOUTH TWICE DAILY. Needs appointment for further refills (Patient taking differently: Take 40 mg by mouth 2 (two) times daily.) 180 tablet 3   polyethylene glycol (MIRALAX / GLYCOLAX) packet Take 17 g by mouth daily as needed for moderate constipation.      tiZANidine (ZANAFLEX) 2 MG tablet TAKE ONE TABLET BY MOUTH EVERYDAY AT BEDTIME AS NEEDED FOR MUSCLE SPASMS 90 tablet 0   No current facility-administered medications on file prior to visit.    Allergies  Allergen Reactions   Prevpac [Amoxicill-Clarithro-Lansopraz] Other (See Comments)    Throat closed up   Latex Rash    Had issues when wearing powdered gloves all day long while working.   Duloxetine Other (See Comments)    Nausea, diarrhea and jittery   Tramadol     GI symptoms    Past Medical History:  Diagnosis Date   Allergic rhinitis, cause unspecified    Allergy    Anemia    Complication of anesthesia    Diverticulosis    DVT (deep venous thrombosis)    x2.  Last in 9/21   Gastric ulcer    in past   GERD (gastroesophageal reflux disease)    Hemorrhoids, internal    Hyperlipidemia  Motion sickness    ocean ships   Osteoarthrosis involving, or with mention of more than one site, but not specified as generalized, multiple sites    PONV (postoperative nausea and vomiting)    Ulcer (traumatic) of oral mucosa    Unspecified venous (peripheral) insufficiency    Vertigo    No episodes over 10 years    Past Surgical History:  Procedure Laterality Date   ABDOMINAL HYSTERECTOMY  1996   BREAST BIOPSY  2003   CATARACT EXTRACTION W/PHACO Right 03/25/2022   Procedure: CATARACT EXTRACTION PHACO AND INTRAOCULAR LENS PLACEMENT ([place]) RIGHT  VIVITY LENS 22.90 01:57.1;  Surgeon: Galen Manila, MD;  Location: [facility];  Service: Ophthalmology;  Laterality: Right;  Latex   CHOLECYSTECTOMY  2000   COLONOSCOPY WITH PROPOFOL N/A 10/02/2015   Procedure:  COLONOSCOPY WITH PROPOFOL;  Surgeon: Christena Deem, MD;  Location: [facility] ENDOSCOPY;  Service: Endoscopy;  Laterality: N/A;   COLONOSCOPY WITH PROPOFOL N/A 06/17/2018   Procedure: COLONOSCOPY WITH PROPOFOL;  Surgeon: Toledo, Boykin Nearing, MD;  Location: [facility] ENDOSCOPY;  Service: Gastroenterology;  Laterality: N/A;   DEXA  5/14   Normal   ESOPHAGOGASTRODUODENOSCOPY (EGD) WITH PROPOFOL N/A 06/17/2018   Procedure: ESOPHAGOGASTRODUODENOSCOPY (EGD) WITH PROPOFOL;  Surgeon: Toledo, Boykin Nearing, MD;  Location: [facility] ENDOSCOPY;  Service: Gastroenterology;  Laterality: N/A;   KNEE ARTHROSCOPY     right in 2007, left in 2011   PERIPHERAL VASCULAR THROMBECTOMY Left 08/27/2020   Procedure: PERIPHERAL VASCULAR THROMBECTOMY;  Surgeon: Annice Needy, MD;  Location: [facility] INVASIVE CV LAB;  Service: Cardiovascular;  Laterality: Left;   TONSILLECTOMY AND ADENOIDECTOMY  childhood    Family History  Problem Relation Age of Onset   Heart disease Mother    Heart disease Father    Diabetes Son 5       Type 1   Hypertension Son    Stroke Brother    Diabetes Other 2       type 1   Hyperlipidemia Neg Hx    Depression Neg Hx     Social History   Socioeconomic History   Marital status: Widowed    Spouse name: Not on file   Number of children: 1   Years of education: Not on file   Highest education level: Not on file  Occupational History   Occupation: Armed forces operational officer    Comment: retired  Tobacco Use   Smoking status: Former    Types: Cigarettes    Quit date: 1990    Years since quitting: 34.3    Passive exposure: Never   Smokeless tobacco: Never  Vaping Use   Vaping Use: Never used  Substance and Sexual Activity   Alcohol use: Yes    Alcohol/week: 2.0 standard drinks of alcohol    Types: 2 Glasses of wine per week    Comment: glass of wine 2x/wk   Drug use: No   Sexual activity: Not on file  Other Topics Concern   Not on file  Social History Narrative   Husband died Mar 17, 2023   Has living will    Asks for Sylvie Farrier to make decisions   Would accept resuscitation--but no prolonged ventilation   Doesn't want tube feeds if cognitively unaware   Social Determinants of Health   Financial Resource Strain: Low Risk  (04/08/2021)   Overall Financial Resource Strain (CARDIA)    Difficulty of Paying Living Expenses: Not very hard  Food Insecurity: No Food Insecurity (12/07/2022)   Hunger Vital Sign  Worried About Programme researcher, broadcasting/film/video in the Last Year: Never true    Ran Out of Food in the Last Year: Never true  Transportation Needs: No Transportation Needs (12/07/2022)   PRAPARE - Administrator, Civil Service (Medical): No    Lack of Transportation (Non-Medical): No  Physical Activity: Not on file  Stress: Not on file  Social Connections: Not on file  Intimate Partner Violence: Not At Risk (12/07/2022)   Humiliation, Afraid, Rape, and Kick questionnaire    Fear of Current or Ex-Partner: No    Emotionally Abused: No    Physically Abused: No    Sexually Abused: No   Review of Systems Bad allergy symptoms---uses zyrtec nightly Eating well She thinks she may have gained some weight    Objective:   Physical Exam Constitutional:      Appearance: Normal appearance.  Cardiovascular:     Rate and Rhythm: Normal rate and regular rhythm.     Heart sounds: No murmur heard.    No gallop.  Pulmonary:     Effort: Pulmonary effort is normal.     Breath sounds: Normal breath sounds. No wheezing or rales.  Musculoskeletal:     Cervical back: Neck supple.     Right lower leg: No edema.     Left lower leg: No edema.  Lymphadenopathy:     Cervical: No cervical adenopathy.  Neurological:     Mental Status: She is alert.            Assessment & Plan:

## 2023-04-09 NOTE — Assessment & Plan Note (Signed)
Multifactorial I think Had pulmonary emboli--but then multifocal pneumonia Still highly symptomatic off oxygen--but has improved I recommended pulmonary evaluation--but she wants to hold off for now Has the albuterol She would benefit from lightweight oxygen to give her more freedom

## 2023-04-09 NOTE — Assessment & Plan Note (Signed)
No indication of recurrence on the eliquis 5 bid

## 2023-04-18 DIAGNOSIS — R0902 Hypoxemia: Secondary | ICD-10-CM | POA: Diagnosis not present

## 2023-04-18 DIAGNOSIS — J9610 Chronic respiratory failure, unspecified whether with hypoxia or hypercapnia: Secondary | ICD-10-CM | POA: Diagnosis not present

## 2023-04-18 DIAGNOSIS — U071 COVID-19: Secondary | ICD-10-CM | POA: Diagnosis not present

## 2023-05-05 ENCOUNTER — Encounter: Payer: Self-pay | Admitting: Internal Medicine

## 2023-05-19 DIAGNOSIS — U071 COVID-19: Secondary | ICD-10-CM | POA: Diagnosis not present

## 2023-05-19 DIAGNOSIS — R0902 Hypoxemia: Secondary | ICD-10-CM | POA: Diagnosis not present

## 2023-05-19 DIAGNOSIS — J9610 Chronic respiratory failure, unspecified whether with hypoxia or hypercapnia: Secondary | ICD-10-CM | POA: Diagnosis not present

## 2023-05-25 ENCOUNTER — Encounter: Payer: Self-pay | Admitting: Internal Medicine

## 2023-06-08 ENCOUNTER — Other Ambulatory Visit: Payer: Self-pay | Admitting: Internal Medicine

## 2023-06-12 ENCOUNTER — Telehealth: Payer: Self-pay | Admitting: Internal Medicine

## 2023-06-12 NOTE — Telephone Encounter (Signed)
Hailey from [employer] department called to see if Dr Alphonsus Sias is going to continue to order patients oxygen and cpap equipment?

## 2023-06-15 ENCOUNTER — Ambulatory Visit: Payer: Self-pay | Admitting: Urology

## 2023-06-15 NOTE — Telephone Encounter (Signed)
Alice Donaldson contacted the office again regarding this, also wanted to confirm if Dr. Alphonsus Sias is putting in DME order for this patient? Please advise, thank you. Can be reached at [phone] opt. 3

## 2023-06-17 ENCOUNTER — Ambulatory Visit: Payer: Self-pay | Admitting: Urology

## 2023-06-17 NOTE — Telephone Encounter (Signed)
Called and spoke to [place]. Apologized that she was not made aware Dr Alphonsus Sias would not be back in the office until next week. But, we faxed orders back to [facility] 06-05-23 with OV note that pt was staying on oxygen. She took that verbal authorization. Nothing more is needed.

## 2023-06-18 DIAGNOSIS — J9610 Chronic respiratory failure, unspecified whether with hypoxia or hypercapnia: Secondary | ICD-10-CM | POA: Diagnosis not present

## 2023-06-18 DIAGNOSIS — U071 COVID-19: Secondary | ICD-10-CM | POA: Diagnosis not present

## 2023-06-18 DIAGNOSIS — R0902 Hypoxemia: Secondary | ICD-10-CM | POA: Diagnosis not present

## 2023-07-03 ENCOUNTER — Telehealth: Payer: Self-pay | Admitting: Oncology

## 2023-07-03 ENCOUNTER — Ambulatory Visit: Payer: PPO

## 2023-07-03 ENCOUNTER — Other Ambulatory Visit: Payer: Self-pay | Admitting: Oncology

## 2023-07-03 DIAGNOSIS — R911 Solitary pulmonary nodule: Secondary | ICD-10-CM

## 2023-07-03 NOTE — Telephone Encounter (Signed)
Pt left a vm and stated she is throwing up and canceled CT for today. Pt stated that a new CT order had to be put in for it to be r/s.    Appts with Irving Copas will also have to be r/s as they were for CT results.   Thank you

## 2023-07-07 ENCOUNTER — Inpatient Hospital Stay: Payer: PPO | Admitting: Oncology

## 2023-07-09 ENCOUNTER — Ambulatory Visit: Payer: PPO | Admitting: Internal Medicine

## 2023-07-13 ENCOUNTER — Ambulatory Visit: Payer: PPO | Admitting: Urology

## 2023-07-13 ENCOUNTER — Encounter: Payer: Self-pay | Admitting: Oncology

## 2023-07-13 ENCOUNTER — Encounter: Payer: Self-pay | Admitting: Urology

## 2023-07-13 VITALS — BP 127/78 | HR 105 | Wt 252.4 lb

## 2023-07-13 DIAGNOSIS — R32 Unspecified urinary incontinence: Secondary | ICD-10-CM

## 2023-07-13 DIAGNOSIS — N3945 Continuous leakage: Secondary | ICD-10-CM

## 2023-07-13 DIAGNOSIS — R350 Frequency of micturition: Secondary | ICD-10-CM

## 2023-07-13 DIAGNOSIS — N3281 Overactive bladder: Secondary | ICD-10-CM

## 2023-07-13 DIAGNOSIS — N3941 Urge incontinence: Secondary | ICD-10-CM | POA: Diagnosis not present

## 2023-07-13 LAB — MICROSCOPIC EXAMINATION

## 2023-07-13 LAB — URINALYSIS, COMPLETE
Bilirubin, UA: NEGATIVE
Glucose, UA: NEGATIVE
Leukocytes,UA: NEGATIVE
Nitrite, UA: NEGATIVE
RBC, UA: NEGATIVE
Specific Gravity, UA: >1.03 — ABNORMAL HIGH (ref 1.005–1.030)
Urobilinogen, Ur: 0.2 mg/dL (ref 0.2–1.0)
pH, UA: 5.5 (ref 5.0–7.5)

## 2023-07-13 LAB — BLADDER SCAN AMB NON-IMAGING

## 2023-07-13 MED ORDER — GEMTESA 75 MG PO TABS: 1.0000 | ORAL_TABLET | Freq: Every day | ORAL | Status: DC

## 2023-07-13 NOTE — Progress Notes (Signed)
07/13/2023 1:58 PM   Alice Donaldson June 27, 1946 [id number]  Referring provider: Karie Schwalbe, MD [address] [place],  [place] [address]  Chief Complaint  Patient presents with   bladder leakage    HPI: I was consulted to assess the patient's frequency both day and night.  During the day she has had rare episodes of urge incontinence not wearing a pad.  She denies stress incontinence.  No bedwetting.  Once she had foot on the floor syndrome  She can void every 90 minutes to 2 hours at night and has significant leg edema.  She voids every hour and cannot hold it for 2 hours.  In January when she was quite sick with pneumonia and COVID she lost sensation to urinate and was leaking a lot  She has a weak flow that is prolonged with a lot of stopping and starting but she feels empty  She has had a hysterectomy  No neurologic issues.  No history of kidney stones bladder surgery or bladder infections  On pelvic examination patient had limited mobility.  Narrow introitus.  Well supported bladder neck.  No stress incontinence   PMH: Past Medical History:  Diagnosis Date   Allergic rhinitis, cause unspecified    Allergy    Anemia    Complication of anesthesia    Diverticulosis    DVT (deep venous thrombosis) (HCC)    x2.  Last in 9/21   Gastric ulcer    in past   GERD (gastroesophageal reflux disease)    Hemorrhoids, internal    Hyperlipidemia    Motion sickness    ocean ships   Osteoarthrosis involving, or with mention of more than one site, but not specified as generalized, multiple sites    PONV (postoperative nausea and vomiting)    Ulcer (traumatic) of oral mucosa    Unspecified venous (peripheral) insufficiency    Vertigo    No episodes over 10 years    Surgical History: Past Surgical History:  Procedure Laterality Date   ABDOMINAL HYSTERECTOMY  1996   BREAST BIOPSY  2003   CATARACT EXTRACTION W/PHACO Right 03/25/2022   Procedure: CATARACT  EXTRACTION PHACO AND INTRAOCULAR LENS PLACEMENT (IOC) RIGHT  VIVITY LENS 22.90 01:57.1;  Surgeon: Galen Manila, MD;  Location: [facility];  Service: Ophthalmology;  Laterality: Right;  Latex   CHOLECYSTECTOMY  2000   COLONOSCOPY WITH PROPOFOL N/A 10/02/2015   Procedure: COLONOSCOPY WITH PROPOFOL;  Surgeon: Christena Deem, MD;  Location: [facility] ENDOSCOPY;  Service: Endoscopy;  Laterality: N/A;   COLONOSCOPY WITH PROPOFOL N/A 06/17/2018   Procedure: COLONOSCOPY WITH PROPOFOL;  Surgeon: Toledo, Boykin Nearing, MD;  Location: [facility] ENDOSCOPY;  Service: Gastroenterology;  Laterality: N/A;   DEXA  5/14   Normal   ESOPHAGOGASTRODUODENOSCOPY (EGD) WITH PROPOFOL N/A 06/17/2018   Procedure: ESOPHAGOGASTRODUODENOSCOPY (EGD) WITH PROPOFOL;  Surgeon: Toledo, Boykin Nearing, MD;  Location: [facility] ENDOSCOPY;  Service: Gastroenterology;  Laterality: N/A;   KNEE ARTHROSCOPY     right in 2007, left in 2011   PERIPHERAL VASCULAR THROMBECTOMY Left 08/27/2020   Procedure: PERIPHERAL VASCULAR THROMBECTOMY;  Surgeon: Annice Needy, MD;  Location: [facility] INVASIVE CV LAB;  Service: Cardiovascular;  Laterality: Left;   TONSILLECTOMY AND ADENOIDECTOMY  childhood    Home Medications:  Allergies as of 07/13/2023       Reactions   Prevpac [amoxicill-clarithro-lansopraz] Other (See Comments)   Throat closed up   Latex Rash   Had issues when wearing powdered gloves all day  long while working.   Duloxetine Other (See Comments)   Nausea, diarrhea and jittery   Tramadol    GI symptoms        Medication List        Accurate as of July 13, 2023  1:58 PM. If you have any questions, ask your nurse or doctor.          acetaminophen 500 MG tablet Commonly known as: TYLENOL Take 1,000 mg by mouth every 8 (eight) hours as needed.   albuterol 108 (90 Base) MCG/ACT inhaler Commonly known as: ProAir HFA INHALE 2 PUFFS INTO THE LUNGS EVERY 6 (SIX) HOURS AS NEEDED FOR WHEEZING.   ALPRAZolam 0.25 MG tablet Commonly  known as: XANAX Take 1 tablet (0.25 mg total) by mouth 2 (two) times daily as needed for anxiety.   apixaban 5 MG Tabs tablet Commonly known as: Eliquis Take 1 tablet (5 mg total) by mouth 2 (two) times daily.   Biotin 5000 5 MG Caps Generic drug: Biotin Take by mouth.   cetirizine 10 MG tablet Commonly known as: ZYRTEC Take 10 mg by mouth at bedtime.   docusate sodium 100 MG capsule Commonly known as: COLACE Take 100 mg by mouth daily as needed.   etodolac 500 MG tablet Commonly known as: LODINE TAKE ONE TABLET BY MOUTH TWICE DAILY AS NEEDED   fluticasone 50 MCG/ACT nasal spray Commonly known as: FLONASE Place 2 sprays into both nostrils daily.   Glucosamine Chondroitin Joint Tabs Take 2 tablets by mouth daily.   LORazepam 0.5 MG tablet Commonly known as: ATIVAN TAKE ONE-HALF TO 1 TABLET BY MOUTH EVERYDAY AT BEDTIME   niacin 500 MG tablet Commonly known as: VITAMIN B3 Take 500 mg by mouth at bedtime.   pantoprazole 40 MG tablet Commonly known as: PROTONIX Take 1 tablet (40 mg total) by mouth 2 (two) times daily.   polyethylene glycol 17 g packet Commonly known as: MIRALAX / GLYCOLAX Take 17 g by mouth daily as needed for moderate constipation.   SALONPAS PAIN RELIEF PATCH EX Apply topically. prn   tiZANidine 2 MG tablet Commonly known as: ZANAFLEX TAKE ONE TABLET BY MOUTH EVERYDAY AT BEDTIME AS NEEDED FOR MUSCLE SPASMS   Vitamin D3 25 MCG (1000 UT) Caps        Allergies:  Allergies  Allergen Reactions   Prevpac [Amoxicill-Clarithro-Lansopraz] Other (See Comments)    Throat closed up   Latex Rash    Had issues when wearing powdered gloves all day long while working.   Duloxetine Other (See Comments)    Nausea, diarrhea and jittery   Tramadol     GI symptoms    Family History: Family History  Problem Relation Age of Onset   Heart disease Mother    Heart disease Father    Diabetes Son 5       Type 1   Hypertension Son    Stroke Brother     Diabetes Other 2       type 1   Hyperlipidemia Neg Hx    Depression Neg Hx     Social History:  reports that she quit smoking about 34 years ago. Her smoking use included cigarettes. She has never been exposed to tobacco smoke. She has never used smokeless tobacco. She reports current alcohol use of about 2.0 standard drinks of alcohol per week. She reports that she does not use drugs.  ROS:  Physical Exam: LMP  (LMP Unknown)   Constitutional:  Alert and oriented, No acute distress.  Laboratory Data: Lab Results  Component Value Date   WBC 8.7 12/14/2022   HGB 12.0 12/14/2022   HCT 37.4 12/14/2022   MCV 82.0 12/14/2022   PLT 449 (H) 12/14/2022    Lab Results  Component Value Date   CREATININE 0.69 12/14/2022    No results found for: "PSA"  No results found for: "TESTOSTERONE"  Lab Results  Component Value Date   HGBA1C 6.1 04/18/2015    Urinalysis    Component Value Date/Time   COLORURINE YELLOW (A) 06/16/2018 1356   APPEARANCEUR CLEAR (A) 06/16/2018 1356   LABSPEC 1.014 06/16/2018 1356   PHURINE 7.0 06/16/2018 1356   GLUCOSEU NEGATIVE 06/16/2018 1356   HGBUR NEGATIVE 06/16/2018 1356   BILIRUBINUR NEGATIVE 06/16/2018 1356   KETONESUR NEGATIVE 06/16/2018 1356   PROTEINUR NEGATIVE 06/16/2018 1356   NITRITE NEGATIVE 06/16/2018 1356   LEUKOCYTESUR NEGATIVE 06/16/2018 1356    Pertinent Imaging: Urine reviewed and sent for culture.  Chart reviewed  Assessment & Plan: Patient primarily has an overactive bladder with infrequent urge incontinence.  she has significant leg edema and likely has a nocturnal diuresis and this was discussed.  She has had blood clots.  She uses a cane.  Patient started on Gemtesa 75 mg samples and prescription and return in 6 weeks  I will check her for blood in the urine next time but otherwise I will hold off on cystoscopy for now based upon immobility There are no diagnoses  linked to this encounter.  No follow-ups on file.  Martina Sinner, MD  [facility] [address], [address] [place], [place] [address] [phone]

## 2023-07-16 ENCOUNTER — Encounter: Payer: Self-pay | Admitting: Urology

## 2023-07-17 ENCOUNTER — Ambulatory Visit: Admission: RE | Admit: 2023-07-17 | Payer: PPO | Source: Ambulatory Visit

## 2023-07-17 DIAGNOSIS — J90 Pleural effusion, not elsewhere classified: Secondary | ICD-10-CM | POA: Diagnosis not present

## 2023-07-17 DIAGNOSIS — R911 Solitary pulmonary nodule: Secondary | ICD-10-CM | POA: Diagnosis not present

## 2023-07-17 DIAGNOSIS — R590 Localized enlarged lymph nodes: Secondary | ICD-10-CM | POA: Diagnosis not present

## 2023-07-17 DIAGNOSIS — R918 Other nonspecific abnormal finding of lung field: Secondary | ICD-10-CM | POA: Diagnosis not present

## 2023-07-19 DIAGNOSIS — J9610 Chronic respiratory failure, unspecified whether with hypoxia or hypercapnia: Secondary | ICD-10-CM | POA: Diagnosis not present

## 2023-07-19 DIAGNOSIS — R0902 Hypoxemia: Secondary | ICD-10-CM | POA: Diagnosis not present

## 2023-07-19 DIAGNOSIS — U071 COVID-19: Secondary | ICD-10-CM | POA: Diagnosis not present

## 2023-07-21 ENCOUNTER — Ambulatory Visit (INDEPENDENT_AMBULATORY_CARE_PROVIDER_SITE_OTHER): Payer: PPO | Admitting: Internal Medicine

## 2023-07-21 ENCOUNTER — Telehealth: Payer: Self-pay | Admitting: *Deleted

## 2023-07-21 ENCOUNTER — Encounter: Payer: Self-pay | Admitting: Internal Medicine

## 2023-07-21 ENCOUNTER — Emergency Department
Admission: EM | Admit: 2023-07-21 | Discharge: 2023-07-22 | Disposition: A | Discharge status: Home / Self Care | Payer: PPO | Attending: Emergency Medicine | Admitting: Emergency Medicine

## 2023-07-21 ENCOUNTER — Telehealth: Payer: Self-pay

## 2023-07-21 ENCOUNTER — Other Ambulatory Visit: Payer: Self-pay

## 2023-07-21 VITALS — BP 132/84 | HR 86 | Temp 97.6°F | Ht 64.0 in | Wt 252.0 lb

## 2023-07-21 DIAGNOSIS — I5031 Acute diastolic (congestive) heart failure: Secondary | ICD-10-CM

## 2023-07-21 DIAGNOSIS — R600 Localized edema: Secondary | ICD-10-CM | POA: Diagnosis not present

## 2023-07-21 DIAGNOSIS — D509 Iron deficiency anemia, unspecified: Secondary | ICD-10-CM | POA: Diagnosis not present

## 2023-07-21 DIAGNOSIS — D649 Anemia, unspecified: Secondary | ICD-10-CM

## 2023-07-21 DIAGNOSIS — J9611 Chronic respiratory failure with hypoxia: Secondary | ICD-10-CM

## 2023-07-21 DIAGNOSIS — R531 Weakness: Secondary | ICD-10-CM | POA: Diagnosis present

## 2023-07-21 DIAGNOSIS — I5032 Chronic diastolic (congestive) heart failure: Secondary | ICD-10-CM | POA: Insufficient documentation

## 2023-07-21 LAB — RENAL FUNCTION PANEL
Albumin: 4.1 g/dL (ref 3.5–5.2)
BUN: 16 mg/dL (ref 6–23)
CO2: 23 mEq/L (ref 19–32)
Calcium: 9.6 mg/dL (ref 8.4–10.5)
Chloride: 106 mEq/L (ref 96–112)
Creatinine, Ser: 0.88 mg/dL (ref 0.40–1.20)
GFR: 62.79 mL/min (ref >60.00)
Glucose, Bld: 105 mg/dL — ABNORMAL HIGH (ref 70–99)
Phosphorus: 3.3 mg/dL (ref 2.3–4.6)
Potassium: 4 mEq/L (ref 3.5–5.1)
Sodium: 138 mEq/L (ref 135–145)

## 2023-07-21 LAB — CBC
HCT: 22.9 % — CL (ref 36.0–46.0)
HCT: 23.7 % — ABNORMAL LOW (ref 36.0–46.0)
Hemoglobin: 6.5 g/dL — CL (ref 12.0–15.0)
Hemoglobin: 6.4 g/dL — ABNORMAL LOW (ref 12.0–15.0)
MCH: 19.5 pg — ABNORMAL LOW (ref 26.0–34.0)
MCHC: 28.4 g/dL — ABNORMAL LOW (ref 30.0–36.0)
MCHC: 27 g/dL — ABNORMAL LOW (ref 30.0–36.0)
MCV: 68.3 fl — ABNORMAL LOW (ref 78.0–100.0)
MCV: 72 fL — ABNORMAL LOW (ref 80.0–100.0)
Platelets: 359 10*3/uL (ref 150.0–400.0)
Platelets: 371 10*3/uL (ref 150–400)
RBC: 3.36 Mil/uL — ABNORMAL LOW (ref 3.87–5.11)
RBC: 3.29 MIL/uL — ABNORMAL LOW (ref 3.87–5.11)
RDW: 17.2 % — ABNORMAL HIGH (ref 11.5–15.5)
RDW: 15.8 % — ABNORMAL HIGH (ref 11.5–15.5)
WBC: 6.3 10*3/uL (ref 4.0–10.5)
WBC: 5.7 10*3/uL (ref 4.0–10.5)
nRBC: 0 % (ref 0.0–0.2)

## 2023-07-21 LAB — HEPATIC FUNCTION PANEL
ALT: 14 U/L (ref 0–35)
AST: 18 U/L (ref 0–37)
Albumin: 4.1 g/dL (ref 3.5–5.2)
Alkaline Phosphatase: 101 U/L (ref 39–117)
Bilirubin, Direct: 0.2 mg/dL (ref 0.0–0.3)
Total Bilirubin: 0.6 mg/dL (ref 0.2–1.2)
Total Protein: 7 g/dL (ref 6.0–8.3)

## 2023-07-21 LAB — BPAM RBC
Blood Product Expiration Date: 202408132359
Blood Product Expiration Date: 202408132359
ISSUE DATE / TIME: 202408061836
ISSUE DATE / TIME: 202408062218
Unit Type and Rh: 600
Unit Type and Rh: 600

## 2023-07-21 LAB — TYPE AND SCREEN
ABO/RH(D): A POS
Antibody Screen: NEGATIVE
Unit division: 0
Unit division: 0

## 2023-07-21 LAB — COMPREHENSIVE METABOLIC PANEL
ALT: 16 U/L (ref 0–44)
AST: 19 U/L (ref 15–41)
Albumin: 3.7 g/dL (ref 3.5–5.0)
Alkaline Phosphatase: 100 U/L (ref 38–126)
Anion gap: 6 (ref 5–15)
BUN: 18 mg/dL (ref 8–23)
CO2: 25 mmol/L (ref 22–32)
Calcium: 9.2 mg/dL (ref 8.9–10.3)
Chloride: 107 mmol/L (ref 98–111)
Creatinine, Ser: 0.92 mg/dL (ref 0.44–1.00)
GFR, Estimated: >60 mL/min (ref >60)
Glucose, Bld: 131 mg/dL — ABNORMAL HIGH (ref 70–99)
Potassium: 3.9 mmol/L (ref 3.5–5.1)
Sodium: 138 mmol/L (ref 135–145)
Total Bilirubin: 0.5 mg/dL (ref 0.3–1.2)
Total Protein: 7.2 g/dL (ref 6.5–8.1)

## 2023-07-21 LAB — VITAMIN B12: Vitamin B-12: 272 pg/mL (ref 180–914)

## 2023-07-21 LAB — HEMOGLOBIN AND HEMATOCRIT, BLOOD
HCT: 24.7 % — ABNORMAL LOW (ref 36.0–46.0)
Hemoglobin: 7.1 g/dL — ABNORMAL LOW (ref 12.0–15.0)

## 2023-07-21 LAB — IRON AND TIBC
Iron: 11 ug/dL — ABNORMAL LOW (ref 28–170)
Saturation Ratios: 2 % — ABNORMAL LOW (ref 10.4–31.8)
TIBC: 489 ug/dL — ABNORMAL HIGH (ref 250–450)
UIBC: 478 ug/dL

## 2023-07-21 LAB — PREPARE RBC (CROSSMATCH)

## 2023-07-21 LAB — BRAIN NATRIURETIC PEPTIDE: Pro B Natriuretic peptide (BNP): 114 pg/mL — ABNORMAL HIGH (ref 0.0–100.0)

## 2023-07-21 LAB — FERRITIN: Ferritin: 3 ng/mL — ABNORMAL LOW (ref 11–307)

## 2023-07-21 MED ORDER — SODIUM CHLORIDE 0.9 % IV SOLN: 10.0000 mL/h | Freq: Once | INTRAVENOUS | Status: DC

## 2023-07-21 MED ORDER — FUROSEMIDE 40 MG PO TABS: 40.0000 mg | ORAL_TABLET | Freq: Every day | ORAL | 3 refills | Status: DC | PRN

## 2023-07-21 NOTE — ED Triage Notes (Signed)
Pt to ed from home via POV for abnormal labs. Pt was seen by her PCP for labs and a CT scan. Labwork came back today at HGB 6.5 so she was told to come here. Pt is caox4, in no acute distress.

## 2023-07-21 NOTE — Telephone Encounter (Signed)
Called and spoke with pt.  At the time had not heard from [facility].  Asked pt to go to ED for evaluation and treatment.  About 10 minutes after calling pt, received a call from Victorino Dike, nurse from [facility] who reported that Dr. Orlie Dakin was out of the office this week and they would not be able to get pt in for infusion.  Let her know that I had asked pt to go to ED for evaluation.  Victorino Dike reported that she would let Dr. Orlie Dakin know as she has an appointment coming up.

## 2023-07-21 NOTE — ED Provider Notes (Signed)
[facility] Provider Note    Event Date/Time   First MD Initiated Contact with Patient 07/21/23 1735     (approximate)  History   Chief Complaint: Abnormal Labs  HPI  Alice Donaldson is a 77 y.o. female with a past medical history of anemia, gastric reflux, hyperlipidemia, anemia, presents to the emergency department for low blood counts.  According to the patient for the last 3 weeks or so she has been feeling weak at times shortness of breath with exertion and has noted some mild lower extremity edema at times as well.  Patient had outpatient lab work done today at a routine PCP appointment which the patient just happened to have scheduled.  She was called back after the appointment stating that her blood counts were 6.5 and she needed to go to the emergency department.  Patient denies any black or bloody stool.  She states in 2017 a similar event occurred when her blood counts dropped very low she was admitted had a GI workup including colonoscopy and endoscopy and never found any source for bleeding.  Patient states afterwards she was started on iron infusions and had been doing those until several years ago and her blood counts have remained steady until today.  Patient denies any vomiting denies any black or bloody stool.  Denies any bleeding of any kind.  Physical Exam   Triage Vital Signs: ED Triage Vitals  Encounter Vitals Group     BP 07/21/23 1702 (!) 155/72     Systolic BP Percentile --      Diastolic BP Percentile --      Pulse Rate 07/21/23 1702 94     Resp 07/21/23 1702 20     Temp 07/21/23 1700 98.1 F (36.7 C)     Temp Source 07/21/23 1702 Oral     SpO2 07/21/23 1702 99 %     Weight 07/21/23 1700 253 lb 8.5 oz (115 kg)     Height 07/21/23 1700 5\' 4"  (1.626 m)     Head Circumference --      Peak Flow --      Pain Score 07/21/23 1659 0     Pain Loc --      Pain Education --      Exclude from Growth Chart --     Most recent vital  signs: Vitals:   07/21/23 1702 07/21/23 1702  BP: (!) 155/72 (!) 155/72  Pulse: 94 93  Resp: 20 15  Temp:  98.1 F (36.7 C)  SpO2: 99% 98%    General: Awake, no distress.  CV:  Good peripheral perfusion.  Regular rate and rhythm  Resp:  Normal effort.  Equal breath sounds bilaterally.  Abd:  No distention.  Soft, nontender.  No rebound or guarding.  ED Results / Procedures / Treatments   MEDICATIONS ORDERED IN ED: Medications  0.9 %  sodium chloride infusion (has no administration in time range)     IMPRESSION / MDM / ASSESSMENT AND PLAN / ED COURSE  I reviewed the triage vital signs and the nursing notes.  Patient's presentation is most consistent with acute presentation with potential threat to life or bodily function.  Patient presents to the emergency department for low blood counts shortness of breath with exertion.  Overall the patient appears well, no distress.  Chemistry is reassuring, CBC however shows a hemoglobin of 6.4 today with MCV of 72.  Patient has a benign abdomen and rectal exam is guaiac negative.  Patient denies any black or bloody stool or vomiting.  Patient highly prefers to go home states she was admitted in 2017 had a full workup and they never found any source of bleeding.  Rectal exam being negative today and as the patient is otherwise stable she would strongly prefer to obtain a blood transfusion and go home.  I spoke to Dr. Smith Robert of hematology who is on-call for Dr. Marcell Anger the patient's hematologist.  She is asked that we order a TIBC, iron, ferritin and vitamin B12 level prior to transfusion.  And if the patient feels well after transfusion patient could be discharged home with outpatient workup.  Patient agreeable to plan as well.  Patient has received 1 unit of blood, recheck shows a hemoglobin of 7.1.  Will transfuse the second unit.  After the second unit has finished transfusing anticipate the patient could be discharged home.  Dr. Marcell Anger will  follow-up with the patient she will also need to follow-up with her outpatient GI doctor.  Patient agreeable to plan of care.   CRITICAL CARE Performed by: Minna Antis   Total critical care time: 30 minutes  Critical care time was exclusive of separately billable procedures and treating other patients.  Critical care was necessary to treat or prevent imminent or life-threatening deterioration.  Critical care was time spent personally by me on the following activities: development of treatment plan with patient and/or surrogate as well as nursing, discussions with consultants, evaluation of patient's response to treatment, examination of patient, obtaining history from patient or surrogate, ordering and performing treatments and interventions, ordering and review of laboratory studies, ordering and review of radiographic studies, pulse oximetry and re-evaluation of patient's condition.  FINAL CLINICAL IMPRESSION(S) / ED DIAGNOSES   Symptomatic anemia Iron deficiency anemia  Note:  This document was prepared using Dragon voice recognition software and may include unintentional dictation errors.   Minna Antis, MD 07/21/23 2213

## 2023-07-21 NOTE — Assessment & Plan Note (Signed)
EF and right side heart were normal back in December--but I worry about pulmonary hypertension after PE (with COVID) Clear fluid overload in feet---not clear in lungs by exam (awaiting chest CT report from 8/2) Will check BNP Trial furosemide 40mg  daily (for now) Stop the salt Follow up next week

## 2023-07-21 NOTE — Telephone Encounter (Signed)
Noted  

## 2023-07-21 NOTE — Telephone Encounter (Signed)
CRITICAL VALUE: Hemoglobin 6.5, Hematocrit 22.9  RECEIVER (on-site recipient of call): Benedict Needy, RN  DATE & TIME NOTIFIED: 07/21/23 @ 3:35p  MESSENGER (representative from lab): Clydie Braun   MD NOTIFIED: Kerby Nora, MD  TIME OF NOTIFICATION: 3:38p  RESPONSE: Per Dr. Ermalene Searing pt needs to go to ED for evaluation and treatment.    3:44p Spoke with pt, she is aware of results.  Reported that she would need to reach out to Dr. Orlie Dakin to see if they can get her in for a blood transfusion.  Writer offered to call [facility] to see if pt could be scheduled for an evaluation and treatment there.   3:50p Called [facility] and left a voicemail on the provider line.  Called pt back to give her an update.  Plan to call her back in 30 minutes if I have not heard from their office sooner.

## 2023-07-21 NOTE — Telephone Encounter (Signed)
Pt seen at [facility] for fatigue, pt hgb 6.5.  nurse calling to see if we could schedule pt for blood transfusion.  This nurse stated unsure of when we would be able to schedule, could be a few days before we have availability nurse verbalized understanding and they are sending pt to the emergency for blood transfusion. Pt is scheduled for yearly follow up on 07/28/23 with Dr Orlie Dakin.

## 2023-07-21 NOTE — Progress Notes (Signed)
Subjective:    Patient ID: Alice Donaldson, female    DOB: [birth year], 77 y.o.   MRN: [id number]  HPI Here for follow up of hypoxic respiratory disease  Actually feels like her breathing is worse Just walking short distances--she "will be gasping" Has checked oximetry--always over 90% Having more foot swelling--especially by the end of the day  Frequent nocturia--"getting rid of the fluid" Sleeps propped at first for back--then flat. No PND No chest pain Does have occasional fast heart fluttering  Current Outpatient Medications on File Prior to Visit  Medication Sig Dispense Refill   acetaminophen (TYLENOL) 500 MG tablet Take 1,000 mg by mouth every 8 (eight) hours as needed.      albuterol (PROAIR HFA) 108 (90 Base) MCG/ACT inhaler INHALE 2 PUFFS INTO THE LUNGS EVERY 6 (SIX) HOURS AS NEEDED FOR WHEEZING. 8.5 each 1   ALPRAZolam (XANAX) 0.25 MG tablet Take 1 tablet (0.25 mg total) by mouth 2 (two) times daily as needed for anxiety. 20 tablet 0   apixaban (ELIQUIS) 5 MG TABS tablet Take 1 tablet (5 mg total) by mouth 2 (two) times daily. 180 tablet 2   Biotin (BIOTIN 5000) 5 MG CAPS Take by mouth.     cetirizine (ZYRTEC) 10 MG tablet Take 10 mg by mouth at bedtime.      Cholecalciferol (VITAMIN D3) 25 MCG (1000 UT) CAPS      docusate sodium (COLACE) 100 MG capsule Take 100 mg by mouth daily as needed.      etodolac (LODINE) 500 MG tablet TAKE ONE TABLET BY MOUTH TWICE DAILY AS NEEDED 180 tablet 0   fluticasone (FLONASE) 50 MCG/ACT nasal spray Place 2 sprays into both nostrils daily. 48 g 3   Glucos-Chondroit-Hyaluron-MSM (GLUCOSAMINE CHONDROITIN JOINT) TABS Take 2 tablets by mouth daily.     LORazepam (ATIVAN) 0.5 MG tablet TAKE ONE-HALF TO 1 TABLET BY MOUTH EVERYDAY AT BEDTIME 30 tablet 0   Menthol-Methyl Salicylate (SALONPAS PAIN RELIEF PATCH EX) Apply topically. prn     niacin (VITAMIN B3) 500 MG tablet Take 500 mg by mouth at bedtime.     pantoprazole (PROTONIX) 40 MG tablet Take  1 tablet (40 mg total) by mouth 2 (two) times daily. 180 tablet 3   polyethylene glycol (MIRALAX / GLYCOLAX) packet Take 17 g by mouth daily as needed for moderate constipation.      tiZANidine (ZANAFLEX) 2 MG tablet TAKE ONE TABLET BY MOUTH EVERYDAY AT BEDTIME AS NEEDED FOR MUSCLE SPASMS 90 tablet 3   Vibegron (GEMTESA) 75 MG TABS Take 1 tablet (75 mg total) by mouth daily.     No current facility-administered medications on file prior to visit.    Allergies  Allergen Reactions   Prevpac [Amoxicill-Clarithro-Lansopraz] Other (See Comments)    Throat closed up   Latex Rash    Had issues when wearing powdered gloves all day long while working.   Duloxetine Other (See Comments)    Nausea, diarrhea and jittery   Tramadol     GI symptoms    Past Medical History:  Diagnosis Date   Allergic rhinitis, cause unspecified    Allergy    Anemia    Complication of anesthesia    Diverticulosis    DVT (deep venous thrombosis) (HCC)    x2.  Last in 9/21   Gastric ulcer    in past   GERD (gastroesophageal reflux disease)    Hemorrhoids, internal    Hyperlipidemia    Motion sickness  ocean ships   Osteoarthrosis involving, or with mention of more than one site, but not specified as generalized, multiple sites    PONV (postoperative nausea and vomiting)    Ulcer (traumatic) of oral mucosa    Unspecified venous (peripheral) insufficiency    Vertigo    No episodes over 10 years    Past Surgical History:  Procedure Laterality Date   ABDOMINAL HYSTERECTOMY  1996   BREAST BIOPSY  2003   CATARACT EXTRACTION W/PHACO Right 03/25/2022   Procedure: CATARACT EXTRACTION PHACO AND INTRAOCULAR LENS PLACEMENT (IOC) RIGHT  VIVITY LENS 22.90 01:57.1;  Surgeon: Galen Manila, MD;  Location: [facility];  Service: Ophthalmology;  Laterality: Right;  Latex   CHOLECYSTECTOMY  2000   COLONOSCOPY WITH PROPOFOL N/A 10/02/2015   Procedure: COLONOSCOPY WITH PROPOFOL;  Surgeon: Christena Deem,  MD;  Location: [facility] ENDOSCOPY;  Service: Endoscopy;  Laterality: N/A;   COLONOSCOPY WITH PROPOFOL N/A 06/17/2018   Procedure: COLONOSCOPY WITH PROPOFOL;  Surgeon: Toledo, Boykin Nearing, MD;  Location: [facility] ENDOSCOPY;  Service: Gastroenterology;  Laterality: N/A;   DEXA  5/14   Normal   ESOPHAGOGASTRODUODENOSCOPY (EGD) WITH PROPOFOL N/A 06/17/2018   Procedure: ESOPHAGOGASTRODUODENOSCOPY (EGD) WITH PROPOFOL;  Surgeon: Toledo, Boykin Nearing, MD;  Location: [facility] ENDOSCOPY;  Service: Gastroenterology;  Laterality: N/A;   KNEE ARTHROSCOPY     right in 2007, left in 2011   PERIPHERAL VASCULAR THROMBECTOMY Left 08/27/2020   Procedure: PERIPHERAL VASCULAR THROMBECTOMY;  Surgeon: Annice Needy, MD;  Location: [facility] INVASIVE CV LAB;  Service: Cardiovascular;  Laterality: Left;   TONSILLECTOMY AND ADENOIDECTOMY  childhood    Family History  Problem Relation Age of Onset   Heart disease Mother    Heart disease Father    Diabetes Son 5       Type 1   Hypertension Son    Stroke Brother    Diabetes Other 2       type 1   Hyperlipidemia Neg Hx    Depression Neg Hx     Social History   Socioeconomic History   Marital status: Widowed    Spouse name: Not on file   Number of children: 1   Years of education: Not on file   Highest education level: Not on file  Occupational History   Occupation: Armed forces operational officer    Comment: retired  Tobacco Use   Smoking status: Former    Current packs/day: 0.00    Types: Cigarettes    Quit date: 1990    Years since quitting: 34.6    Passive exposure: Never   Smokeless tobacco: Never  Vaping Use   Vaping status: Never Used  Substance and Sexual Activity   Alcohol use: Yes    Alcohol/week: 2.0 standard drinks of alcohol    Types: 2 Glasses of wine per week    Comment: glass of wine 2x/wk   Drug use: No   Sexual activity: Not on file  Other Topics Concern   Not on file  Social History Narrative   Husband died 03/16/23   Has living will   Asks for Sylvie Farrier to make  decisions   Would accept resuscitation--but no prolonged ventilation   Doesn't want tube feeds if cognitively unaware   Social Determinants of Health   Financial Resource Strain: Low Risk  (04/08/2021)   Overall Financial Resource Strain (CARDIA)    Difficulty of Paying Living Expenses: Not very hard  Food Insecurity: No Food Insecurity (12/07/2022)   Hunger Vital Sign  Worried About Programme researcher, broadcasting/film/video in the Last Year: Never true    Ran Out of Food in the Last Year: Never true  Transportation Needs: No Transportation Needs (12/07/2022)   PRAPARE - Administrator, Civil Service (Medical): No    Lack of Transportation (Non-Medical): No  Physical Activity: Not on file  Stress: Not on file  Social Connections: Not on file  Intimate Partner Violence: Not At Risk (12/07/2022)   Humiliation, Afraid, Rape, and Kick questionnaire    Fear of Current or Ex-Partner: No    Emotionally Abused: No    Physically Abused: No    Sexually Abused: No   Review of Systems Does use salt--working on cutting back    Objective:   Physical Exam Constitutional:      Appearance: Normal appearance.  Cardiovascular:     Rate and Rhythm: Normal rate and regular rhythm.     Heart sounds: No murmur heard.    No gallop.  Pulmonary:     Effort: Pulmonary effort is normal.     Breath sounds: Normal breath sounds. No wheezing or rales.  Musculoskeletal:     Cervical back: Neck supple.     Comments: 2+ tense edema in calves/feet  Lymphadenopathy:     Cervical: No cervical adenopathy.  Neurological:     Mental Status: She is alert.            Assessment & Plan:

## 2023-07-21 NOTE — Assessment & Plan Note (Signed)
Hasn't seemed to be hypoxic again Will consider pulmonary referral if not elevated BNP

## 2023-07-21 NOTE — Discharge Instructions (Signed)
Please follow-up with your hematologist as soon as possible.  You will also need to follow-up with your GI doctor.  Return to the emergency department for any further weakness any trouble breathing or any other symptom concerning to yourself.

## 2023-07-21 NOTE — Patient Instructions (Signed)
Stop the salt-- you can use salt substitute sparingly Take the furosemide every morning Check you weight daily after emptying your bladder--and record and bring in with you

## 2023-07-22 ENCOUNTER — Other Ambulatory Visit: Payer: Self-pay | Admitting: Nurse Practitioner

## 2023-07-22 DIAGNOSIS — D509 Iron deficiency anemia, unspecified: Secondary | ICD-10-CM

## 2023-07-22 NOTE — Telephone Encounter (Signed)
Went to ER and got transfused 2 units of pRBC and sent home Results noted. See lab report for further communication

## 2023-07-22 NOTE — Progress Notes (Signed)
Patient seen in ER for symptomatic anemia. Schedule message sent for lab, see me, poss feraheme later this week. Orders entered. She will likely need GI referral as well.

## 2023-07-24 ENCOUNTER — Inpatient Hospital Stay (HOSPITAL_BASED_OUTPATIENT_CLINIC_OR_DEPARTMENT_OTHER): Payer: PPO | Admitting: Nurse Practitioner

## 2023-07-24 ENCOUNTER — Inpatient Hospital Stay: Payer: PPO | Attending: Oncology

## 2023-07-24 ENCOUNTER — Inpatient Hospital Stay: Payer: PPO

## 2023-07-24 ENCOUNTER — Encounter: Payer: Self-pay | Admitting: Nurse Practitioner

## 2023-07-24 VITALS — BP 127/63 | HR 75 | Temp 98.9°F | Resp 20 | Wt 243.0 lb

## 2023-07-24 VITALS — BP 125/75 | HR 68 | Resp 18

## 2023-07-24 DIAGNOSIS — D500 Iron deficiency anemia secondary to blood loss (chronic): Secondary | ICD-10-CM

## 2023-07-24 DIAGNOSIS — E538 Deficiency of other specified B group vitamins: Secondary | ICD-10-CM | POA: Diagnosis not present

## 2023-07-24 DIAGNOSIS — D509 Iron deficiency anemia, unspecified: Secondary | ICD-10-CM | POA: Insufficient documentation

## 2023-07-24 DIAGNOSIS — R911 Solitary pulmonary nodule: Secondary | ICD-10-CM

## 2023-07-24 DIAGNOSIS — K449 Diaphragmatic hernia without obstruction or gangrene: Secondary | ICD-10-CM | POA: Diagnosis not present

## 2023-07-24 DIAGNOSIS — Z79899 Other long term (current) drug therapy: Secondary | ICD-10-CM | POA: Insufficient documentation

## 2023-07-24 LAB — CBC WITH DIFFERENTIAL/PLATELET
Abs Immature Granulocytes: 0.04 10*3/uL (ref 0.00–0.07)
Basophils Absolute: 0.1 10*3/uL (ref 0.0–0.1)
Basophils Relative: 1 %
Eosinophils Absolute: 0.3 10*3/uL (ref 0.0–0.5)
Eosinophils Relative: 4 %
HCT: 29.4 % — ABNORMAL LOW (ref 36.0–46.0)
Hemoglobin: 8.4 g/dL — ABNORMAL LOW (ref 12.0–15.0)
Immature Granulocytes: 1 %
Lymphocytes Relative: 25 %
Lymphs Abs: 1.4 10*3/uL (ref 0.7–4.0)
MCH: 21.3 pg — ABNORMAL LOW (ref 26.0–34.0)
MCHC: 28.6 g/dL — ABNORMAL LOW (ref 30.0–36.0)
MCV: 74.6 fL — ABNORMAL LOW (ref 80.0–100.0)
Monocytes Absolute: 0.8 10*3/uL (ref 0.1–1.0)
Monocytes Relative: 13 %
Neutro Abs: 3.3 10*3/uL (ref 1.7–7.7)
Neutrophils Relative %: 56 %
Platelets: 334 10*3/uL (ref 150–400)
RBC: 3.94 MIL/uL (ref 3.87–5.11)
RDW: 18.1 % — ABNORMAL HIGH (ref 11.5–15.5)
WBC: 5.9 10*3/uL (ref 4.0–10.5)
nRBC: 0 % (ref 0.0–0.2)

## 2023-07-24 LAB — IRON AND TIBC
Iron: 17 ug/dL — ABNORMAL LOW (ref 28–170)
Saturation Ratios: 3 % — ABNORMAL LOW (ref 10.4–31.8)
TIBC: 503 ug/dL — ABNORMAL HIGH (ref 250–450)
UIBC: 486 ug/dL

## 2023-07-24 LAB — VITAMIN B12: Vitamin B-12: 336 pg/mL (ref 180–914)

## 2023-07-24 LAB — COMPREHENSIVE METABOLIC PANEL
ALT: 17 U/L (ref 0–44)
AST: 20 U/L (ref 15–41)
Albumin: 3.8 g/dL (ref 3.5–5.0)
Alkaline Phosphatase: 104 U/L (ref 38–126)
Anion gap: 7 (ref 5–15)
BUN: 22 mg/dL (ref 8–23)
CO2: 23 mmol/L (ref 22–32)
Calcium: 9.3 mg/dL (ref 8.9–10.3)
Chloride: 107 mmol/L (ref 98–111)
Creatinine, Ser: 0.91 mg/dL (ref 0.44–1.00)
GFR, Estimated: >60 mL/min (ref >60)
Glucose, Bld: 99 mg/dL (ref 70–99)
Potassium: 3.8 mmol/L (ref 3.5–5.1)
Sodium: 137 mmol/L (ref 135–145)
Total Bilirubin: 0.6 mg/dL (ref 0.3–1.2)
Total Protein: 6.9 g/dL (ref 6.5–8.1)

## 2023-07-24 LAB — SAMPLE TO BLOOD BANK

## 2023-07-24 LAB — FERRITIN: Ferritin: 6 ng/mL — ABNORMAL LOW (ref 11–307)

## 2023-07-24 MED ORDER — SODIUM CHLORIDE 0.9 % IV SOLN
510.0000 mg | Freq: Once | INTRAVENOUS | Status: AC
  Administered 2023-07-24: 510 mg via INTRAVENOUS
  Filled 2023-07-24: qty 510

## 2023-07-24 MED ORDER — SODIUM CHLORIDE 0.9 % IV SOLN
Freq: Once | INTRAVENOUS | Status: AC
  Filled 2023-07-24: qty 250

## 2023-07-24 NOTE — Patient Instructions (Signed)
 Ferumoxytol Injection What is this medication? FERUMOXYTOL (FER ue MOX i tol) treats low levels of iron in your body (iron deficiency anemia). Iron is a mineral that plays an important role in making red blood cells, which carry oxygen from your lungs to the rest of your body. This medicine may be used for other purposes; ask your health care provider or pharmacist if you have questions. COMMON BRAND NAME(S): Feraheme What should I tell my care team before I take this medication? They need to know if you have any of these conditions: Anemia not caused by low iron levels High levels of iron in the blood Magnetic resonance imaging (MRI) test scheduled An unusual or allergic reaction to iron, other medications, foods, dyes, or preservatives Pregnant or trying to get pregnant Breastfeeding How should I use this medication? This medication is injected into a vein. It is given by your care team in a hospital or clinic setting. Talk to your care team the use of this medication in children. Special care may be needed. Overdosage: If you think you have taken too much of this medicine contact a poison control center or emergency room at once. NOTE: This medicine is only for you. Do not share this medicine with others. What if I miss a dose? It is important not to miss your dose. Call your care team if you are unable to keep an appointment. What may interact with this medication? Other iron products This list may not describe all possible interactions. Give your health care provider a list of all the medicines, herbs, non-prescription drugs, or dietary supplements you use. Also tell them if you smoke, drink alcohol, or use illegal drugs. Some items may interact with your medicine. What should I watch for while using this medication? Visit your care team regularly. Tell your care team if your symptoms do not start to get better or if they get worse. You may need blood work done while you are taking this  medication. You may need to follow a special diet. Talk to your care team. Foods that contain iron include: whole grains/cereals, dried fruits, beans, or peas, leafy green vegetables, and organ meats (liver, kidney). What side effects may I notice from receiving this medication? Side effects that you should report to your care team as soon as possible: Allergic reactions--skin rash, itching, hives, swelling of the face, lips, tongue, or throat Low blood pressure--dizziness, feeling faint or lightheaded, blurry vision Shortness of breath Side effects that usually do not require medical attention (report to your care team if they continue or are bothersome): Flushing Headache Joint pain Muscle pain Nausea Pain, redness, or irritation at injection site This list may not describe all possible side effects. Call your doctor for medical advice about side effects. You may report side effects to FDA at 1-800-FDA-1088. Where should I keep my medication? This medication is given in a hospital or clinic. It will not be stored at home. NOTE: This sheet is a summary. It may not cover all possible information. If you have questions about this medicine, talk to your doctor, pharmacist, or health care provider.  2024 Elsevier/Gold Standard (2023-05-08 00:00:00)

## 2023-07-24 NOTE — Progress Notes (Signed)
[facility]  [phone] [phone]  ID: Alice Donaldson OB: 1946/08/24  MR#: [id number]  AOZ#:[id number]  Patient Care Team: Karie Schwalbe, MD as PCP - General (Pediatrics) Jeralyn Ruths, MD as Consulting Physician (Hematology and Oncology) Kathyrn Sheriff, [facility] (Inactive) as Pharmacist (Pharmacist)  CHIEF COMPLAINT: Iron deficiency anemia & lung nodule  INTERVAL HISTORY: Patient is 77 year old female who returns to clinic for follow up. She was in ER on 07/21/23 for 3 week history of shortness of breath and was found to have hemoglobin 6.4. She received 2 units pRBCs. Rectal exam was negative. She was asymptomatic of bleeding and was discharged home.  Today, she says that she feels slightly better than on 8/6 but continues to be short of breath with exertion and is fatigued. She continues to deny melena or hematochezia. No pica or restless leg. Denies vaginal bleeding or hematuria. Denies abnormal bleeding. Reports compliance with eliquis and does bruise easily. Has a family history of multiple myeloma. She denies unintentional weight loss, recurrent infections, fevers, chills.   REVIEW OF SYSTEMS:   Review of Systems  Constitutional:  Positive for malaise/fatigue. Negative for chills, fever and weight loss.  HENT:  Negative for nosebleeds.   Respiratory: Negative.  Negative for cough, hemoptysis and shortness of breath.   Cardiovascular: Negative.  Negative for chest pain, palpitations and leg swelling.  Gastrointestinal: Negative.  Negative for abdominal pain, blood in stool, constipation, diarrhea, melena, nausea and vomiting.  Genitourinary: Negative.  Negative for dysuria and hematuria.  Musculoskeletal: Negative.  Negative for back pain and falls.  Skin: Negative.  Negative for rash.  Neurological:  Positive for weakness. Negative for sensory change, focal weakness and headaches.  Endo/Heme/Allergies:  Bruises/bleeds easily.   Psychiatric/Behavioral: Negative.  Negative for depression. The patient is not nervous/anxious.   As per HPI. Otherwise, a complete review of systems is negative.  PAST MEDICAL HISTORY: Past Medical History:  Diagnosis Date   Allergic rhinitis, cause unspecified    Allergy    Anemia    Complication of anesthesia    Diverticulosis    DVT (deep venous thrombosis) (HCC)    x2.  Last in 9/21   Gastric ulcer    in past   GERD (gastroesophageal reflux disease)    Hemorrhoids, internal    Hyperlipidemia    Motion sickness    ocean ships   Osteoarthrosis involving, or with mention of more than one site, but not specified as generalized, multiple sites    PONV (postoperative nausea and vomiting)    Ulcer (traumatic) of oral mucosa    Unspecified venous (peripheral) insufficiency    Vertigo    No episodes over 10 years    PAST SURGICAL HISTORY: Past Surgical History:  Procedure Laterality Date   ABDOMINAL HYSTERECTOMY  1996   BREAST BIOPSY  2003   CATARACT EXTRACTION W/PHACO Right 03/25/2022   Procedure: CATARACT EXTRACTION PHACO AND INTRAOCULAR LENS PLACEMENT (IOC) RIGHT  VIVITY LENS 22.90 01:57.1;  Surgeon: Galen Manila, MD;  Location: [facility];  Service: Ophthalmology;  Laterality: Right;  Latex   CHOLECYSTECTOMY  2000   COLONOSCOPY WITH PROPOFOL N/A 10/02/2015   Procedure: COLONOSCOPY WITH PROPOFOL;  Surgeon: Christena Deem, MD;  Location: [facility];  Service: Endoscopy;  Laterality: N/A;   COLONOSCOPY WITH PROPOFOL N/A 06/17/2018   Procedure: COLONOSCOPY WITH PROPOFOL;  Surgeon: Toledo, Boykin Nearing, MD;  Location: [facility] ENDOSCOPY;  Service: Gastroenterology;  Laterality: N/A;   DEXA  5/14  Normal   ESOPHAGOGASTRODUODENOSCOPY (EGD) WITH PROPOFOL N/A 06/17/2018   Procedure: ESOPHAGOGASTRODUODENOSCOPY (EGD) WITH PROPOFOL;  Surgeon: Toledo, Boykin Nearing, MD;  Location: [facility] ENDOSCOPY;  Service: Gastroenterology;  Laterality: N/A;   KNEE ARTHROSCOPY     right in  2007, left in 2011   PERIPHERAL VASCULAR THROMBECTOMY Left 08/27/2020   Procedure: PERIPHERAL VASCULAR THROMBECTOMY;  Surgeon: Annice Needy, MD;  Location: [facility] INVASIVE CV LAB;  Service: Cardiovascular;  Laterality: Left;   TONSILLECTOMY AND ADENOIDECTOMY  childhood    FAMILY HISTORY: Family History  Problem Relation Age of Onset   Heart disease Mother    Heart disease Father    Diabetes Son 5       Type 1   Hypertension Son    Stroke Brother    Diabetes Other 2       type 1   Hyperlipidemia Neg Hx    Depression Neg Hx     ADVANCED DIRECTIVES (Y/N):  N  HEALTH MAINTENANCE: Social History   Tobacco Use   Smoking status: Former    Current packs/day: 0.00    Types: Cigarettes    Quit date: 1990    Years since quitting: 34.6    Passive exposure: Never   Smokeless tobacco: Never  Vaping Use   Vaping status: Never Used  Substance Use Topics   Alcohol use: Yes    Alcohol/week: 2.0 standard drinks of alcohol    Types: 2 Glasses of wine per week    Comment: glass of wine 2x/wk   Drug use: No     Colonoscopy:  PAP:  Bone density:  Lipid panel:  Allergies  Allergen Reactions   Prevpac [Amoxicill-Clarithro-Lansopraz] Other (See Comments)    Throat closed up   Latex Rash    Had issues when wearing powdered gloves all day long while working.   Duloxetine Other (See Comments)    Nausea, diarrhea and jittery   Tramadol     GI symptoms    Current Outpatient Medications  Medication Sig Dispense Refill   acetaminophen (TYLENOL) 500 MG tablet Take 1,000 mg by mouth every 8 (eight) hours as needed.      albuterol (PROAIR HFA) 108 (90 Base) MCG/ACT inhaler INHALE 2 PUFFS INTO THE LUNGS EVERY 6 (SIX) HOURS AS NEEDED FOR WHEEZING. 8.5 each 1   ALPRAZolam (XANAX) 0.25 MG tablet Take 1 tablet (0.25 mg total) by mouth 2 (two) times daily as needed for anxiety. 20 tablet 0   apixaban (ELIQUIS) 5 MG TABS tablet Take 1 tablet (5 mg total) by mouth 2 (two) times daily. 180 tablet 2    Biotin (BIOTIN 5000) 5 MG CAPS Take by mouth.     cetirizine (ZYRTEC) 10 MG tablet Take 10 mg by mouth at bedtime.      Cholecalciferol (VITAMIN D3) 25 MCG (1000 UT) CAPS      docusate sodium (COLACE) 100 MG capsule Take 100 mg by mouth daily as needed.      etodolac (LODINE) 500 MG tablet TAKE ONE TABLET BY MOUTH TWICE DAILY AS NEEDED 180 tablet 0   fluticasone (FLONASE) 50 MCG/ACT nasal spray Place 2 sprays into both nostrils daily. 48 g 3   furosemide (LASIX) 40 MG tablet Take 1 tablet (40 mg total) by mouth daily as needed. 30 tablet 3   Glucos-Chondroit-Hyaluron-MSM (GLUCOSAMINE CHONDROITIN JOINT) TABS Take 2 tablets by mouth daily.     LORazepam (ATIVAN) 0.5 MG tablet TAKE ONE-HALF TO 1 TABLET BY MOUTH EVERYDAY AT BEDTIME 30 tablet 0  Menthol-Methyl Salicylate (SALONPAS PAIN RELIEF PATCH EX) Apply topically. prn     niacin (VITAMIN B3) 500 MG tablet Take 500 mg by mouth at bedtime.     pantoprazole (PROTONIX) 40 MG tablet Take 1 tablet (40 mg total) by mouth 2 (two) times daily. 180 tablet 3   polyethylene glycol (MIRALAX / GLYCOLAX) packet Take 17 g by mouth daily as needed for moderate constipation.      tiZANidine (ZANAFLEX) 2 MG tablet TAKE ONE TABLET BY MOUTH EVERYDAY AT BEDTIME AS NEEDED FOR MUSCLE SPASMS 90 tablet 3   Vibegron (GEMTESA) 75 MG TABS Take 1 tablet (75 mg total) by mouth daily.     No current facility-administered medications for this visit.    OBJECTIVE: Vitals:   07/24/23 0936  BP: 127/63  Pulse: 75  Resp: 20  Temp: 98.9 F (37.2 C)  SpO2: 100%     Body mass index is 41.71 kg/m.    ECOG FS:0 - Asymptomatic  General: Well-developed, well-nourished, no acute distress. Eyes: Pink conjunctiva, anicteric sclera. Lungs: Clear to auscultation bilaterally.  No audible wheezing or coughing Heart: Regular rate and rhythm.  Abdomen: Soft, nontender, nondistended.  Musculoskeletal: No edema, cyanosis, or clubbing. Neuro: Alert, answering all questions  appropriately. Cranial nerves grossly intact. Skin: No rashes or petechiae noted. Psych: Normal affect.   LAB RESULTS:  Lab Results  Component Value Date   NA 137 07/24/2023   K 3.8 07/24/2023   CL 107 07/24/2023   CO2 23 07/24/2023   GLUCOSE 99 07/24/2023   BUN 22 07/24/2023   CREATININE 0.91 07/24/2023   CALCIUM 9.3 07/24/2023   PROT 6.9 07/24/2023   ALBUMIN 3.8 07/24/2023   AST 20 07/24/2023   ALT 17 07/24/2023   ALKPHOS 104 07/24/2023   BILITOT 0.6 07/24/2023   GFRNONAA >60 07/24/2023   GFRAA >60 08/27/2020   Lab Results  Component Value Date   WBC 5.9 07/24/2023   NEUTROABS 3.3 07/24/2023   HGB 8.4 (L) 07/24/2023   HCT 29.4 (L) 07/24/2023   MCV 74.6 (L) 07/24/2023   PLT 334 07/24/2023   Lab Results  Component Value Date   IRON 11 (L) 07/21/2023   TIBC 489 (H) 07/21/2023   IRONPCTSAT 2 (L) 07/21/2023   Lab Results  Component Value Date   FERRITIN 3 (L) 07/21/2023     STUDIES: CT CHEST WO CONTRAST  Result Date: 07/23/2023 CLINICAL DATA:  Follow-up pulmonary nodules EXAM: CT CHEST WITHOUT CONTRAST TECHNIQUE: Multidetector CT imaging of the chest was performed following the standard protocol without IV contrast. RADIATION DOSE REDUCTION: This exam was performed according to the departmental dose-optimization program which includes automated exposure control, adjustment of the mA and/or kV according to patient size and/or use of iterative reconstruction technique. COMPARISON:  07/02/2022 chest CT FINDINGS: Cardiovascular: Normal heart size. No significant pericardial effusion/thickening. Left anterior descending coronary atherosclerosis. Atherosclerotic nonaneurysmal thoracic aorta. Normal caliber pulmonary arteries. Mediastinum/Nodes: No significant thyroid nodules. Unremarkable esophagus. No pathologically enlarged axillary, mediastinal or hilar lymph nodes, noting limited sensitivity for the detection of hilar adenopathy on this noncontrast study. Lungs/Pleura: No  pneumothorax. No right pleural effusion. Trace posterior left pleural effusion is similar. Moderate compressive atelectasis in the medial lower lobes bilaterally from the hiatal hernia, left greater than right. No acute consolidative airspace disease or lung masses. Several small solid pulmonary nodules scattered in both lungs, largest 0.4 cm in the posteromedial right lower lobe (series 4/image 80), all stable. No new significant pulmonary nodules. Upper abdomen: Chronic  large hiatal hernia without significant gastric distention. Cholecystectomy. Lobulated simple 4.3 cm left liver cyst with additional scattered subcentimeter hypodense left liver lesions are too small to characterize and unchanged, considered benign. Musculoskeletal: No aggressive appearing focal osseous lesions. Mild thoracic spondylosis. IMPRESSION: 1. Stable small solid pulmonary nodules, considered benign, requiring no further imaging follow-up. 2. Trace posterior left pleural effusion, similar. 3. Chronic large hiatal hernia. 4. One vessel coronary atherosclerosis. 5.  Aortic Atherosclerosis (ICD10-I70.0). Electronically Signed   By: Delbert Phenix M.D.   On: 07/23/2023 08:40    ASSESSMENT: Iron deficiency anemia.  PLAN:    1.  Iron deficiency anemia: Etiology: see below. Her last dose of IV feraheme was 12/24/20. Hemoglobin had worsened to 6.4, ferritin 3, iron sat 3%. We reviewed labs in detail today and consistent with Iron deficiency anemia. Recommend IV iron for replenishment of iron stores. She has previously taken feraheme and tolerated well. Plan for feraheme 500 mg today and again next week. Plan to recheck her counts in 1 month.  2. Etiology of iron deficiency- History of chronic gastritis, diverticulosis and internal hemorrhoids and hiatal hernia. Last egd, colonoscopy and capsule study were performed in 2019 with no obvious bleeding at that time. She is due for screening colonoscopy later this year but is reluctant to undergo  additional procedures given previous negative results and given her current symptoms. We can hold GI for now but reviewed that she may need to reconsider if she has repeat bleeding given that her last workup was 5+ years ago. No apparent vaginal bleeding or hematuria.  3. Symptomatic anemia- hemoglobin 6.4 (8.6/24). s/p 2 units pRBCs.  4. B12 Deficiency- last b12 injection on 02/28/21. B12 today is 336. Improved. Could consider injections in the future.  5. Thrombocytopenia- resolved.  6. Family history of multiple myeloma- Brother. Reviewed that there is nothing currently in her lab work suggestive of myeloma. No indication for additional workup at this time.  7. Lung nodule- she has a history of pulmonary nodules and has been followed by lung nodule clinic. CT chest wo contrast from 07/17/23 was reviewed independently and lung nodules appear stable. Largest nodule measures 0.4 cm in RLL and is unchanged. No adenopathy and no evidence of malignancy. Nodules have been stable for several years and are stable reflecting likely benign etiology. No additional imaging or surveillance recommended.  8. Hiatal hernia- chronic. Large. Likely contributing to iron deficiency. Discussed how hernia can cause malabsorption of oral iron and contribute to IDA today.  9. Incidental findings- on ct. Trace posterial left pleural effusion (possibly secondary to recent pneumonia), coronary atherosclerosis. Recommended she follow up with pcp for management.  10. DVT and Recurrent pulmonary emboli- on eliquis. Continue.   Disposition: Feraheme today and feraheme next week.  4 weeks- labs (cbc, ferritin, iron studies, b12), see me, +/- feraheme- la   Patient expressed understanding and was in agreement with this plan. She also understands that She can call clinic at any time with any questions, concerns, or complaints.    Alinda Dooms, NP 07/24/2023

## 2023-07-27 ENCOUNTER — Encounter: Payer: Self-pay | Admitting: Internal Medicine

## 2023-07-28 ENCOUNTER — Inpatient Hospital Stay: Payer: PPO | Admitting: Oncology

## 2023-07-29 ENCOUNTER — Inpatient Hospital Stay: Payer: PPO

## 2023-07-29 VITALS — BP 112/84 | HR 85 | Temp 97.8°F | Resp 18

## 2023-07-29 DIAGNOSIS — D500 Iron deficiency anemia secondary to blood loss (chronic): Secondary | ICD-10-CM

## 2023-07-29 DIAGNOSIS — D509 Iron deficiency anemia, unspecified: Secondary | ICD-10-CM | POA: Diagnosis not present

## 2023-07-29 MED ORDER — SODIUM CHLORIDE 0.9 % IV SOLN
Freq: Once | INTRAVENOUS | Status: AC
  Filled 2023-07-29: qty 250

## 2023-07-29 MED ORDER — SODIUM CHLORIDE 0.9 % IV SOLN
510.0000 mg | Freq: Once | INTRAVENOUS | Status: AC
  Administered 2023-07-29: 510 mg via INTRAVENOUS
  Filled 2023-07-29: qty 510

## 2023-07-29 MED ORDER — SODIUM CHLORIDE 0.9% FLUSH
10.0000 mL | Freq: Once | INTRAVENOUS | Status: AC
  Administered 2023-07-29: 10 mL via INTRAVENOUS
  Filled 2023-07-29: qty 10

## 2023-07-29 NOTE — Progress Notes (Signed)
Patient tolerated Venofer infusion well, no questions/concerns voiced. Monitored 30 min post transfusion. Patient stable at discharge. VSS. AVS given.

## 2023-07-29 NOTE — Patient Instructions (Signed)
 Iron Sucrose Injection What is this medication? IRON SUCROSE (EYE ern SOO krose) treats low levels of iron (iron deficiency anemia) in people with kidney disease. Iron is a mineral that plays an important role in making red blood cells, which carry oxygen from your lungs to the rest of your body. This medicine may be used for other purposes; ask your health care provider or pharmacist if you have questions. COMMON BRAND NAME(S): Venofer What should I tell my care team before I take this medication? They need to know if you have any of these conditions: Anemia not caused by low iron levels Heart disease High levels of iron in the blood Kidney disease Liver disease An unusual or allergic reaction to iron, other medications, foods, dyes, or preservatives Pregnant or trying to get pregnant Breastfeeding How should I use this medication? This medication is for infusion into a vein. It is given in a hospital or clinic setting. Talk to your care team about the use of this medication in children. While this medication may be prescribed for children as young as 2 years for selected conditions, precautions do apply. Overdosage: If you think you have taken too much of this medicine contact a poison control center or emergency room at once. NOTE: This medicine is only for you. Do not share this medicine with others. What if I miss a dose? Keep appointments for follow-up doses. It is important not to miss your dose. Call your care team if you are unable to keep an appointment. What may interact with this medication? Do not take this medication with any of the following: Deferoxamine Dimercaprol Other iron products This medication may also interact with the following: Chloramphenicol Deferasirox This list may not describe all possible interactions. Give your health care provider a list of all the medicines, herbs, non-prescription drugs, or dietary supplements you use. Also tell them if you smoke,  drink alcohol, or use illegal drugs. Some items may interact with your medicine. What should I watch for while using this medication? Visit your care team regularly. Tell your care team if your symptoms do not start to get better or if they get worse. You may need blood work done while you are taking this medication. You may need to follow a special diet. Talk to your care team. Foods that contain iron include: whole grains/cereals, dried fruits, beans, or peas, leafy green vegetables, and organ meats (liver, kidney). What side effects may I notice from receiving this medication? Side effects that you should report to your care team as soon as possible: Allergic reactions--skin rash, itching, hives, swelling of the face, lips, tongue, or throat Low blood pressure--dizziness, feeling faint or lightheaded, blurry vision Shortness of breath Side effects that usually do not require medical attention (report to your care team if they continue or are bothersome): Flushing Headache Joint pain Muscle pain Nausea Pain, redness, or irritation at injection site This list may not describe all possible side effects. Call your doctor for medical advice about side effects. You may report side effects to FDA at 1-800-FDA-1088. Where should I keep my medication? This medication is given in a hospital or clinic. It will not be stored at home. NOTE: This sheet is a summary. It may not cover all possible information. If you have questions about this medicine, talk to your doctor, pharmacist, or health care provider.  2024 Elsevier/Gold Standard (2023-05-08 00:00:00)

## 2023-07-30 ENCOUNTER — Encounter (INDEPENDENT_AMBULATORY_CARE_PROVIDER_SITE_OTHER): Payer: Self-pay

## 2023-07-30 ENCOUNTER — Encounter: Payer: Self-pay | Admitting: Internal Medicine

## 2023-07-30 ENCOUNTER — Ambulatory Visit (INDEPENDENT_AMBULATORY_CARE_PROVIDER_SITE_OTHER): Payer: PPO | Admitting: Internal Medicine

## 2023-07-30 ENCOUNTER — Telehealth: Payer: Self-pay

## 2023-07-30 VITALS — BP 112/70 | HR 65 | Temp 97.8°F | Ht 64.0 in | Wt 240.0 lb

## 2023-07-30 DIAGNOSIS — D509 Iron deficiency anemia, unspecified: Secondary | ICD-10-CM

## 2023-07-30 DIAGNOSIS — I5031 Acute diastolic (congestive) heart failure: Secondary | ICD-10-CM

## 2023-07-30 NOTE — Patient Instructions (Signed)
Please continue the furosemide daily for a couple of days more to see if your weight goes down. Once you reach a stable low weight, you can change the furosemide to as needed--when you feel the fluid or your weight goes up 2# or more.

## 2023-07-30 NOTE — Progress Notes (Signed)
Subjective:    Patient ID: Alice Donaldson, female    DOB: 06/05/1946, 77 y.o.   MRN: [id number]  HPI Here for follow up of CHF and anemia  Is better--but still has some trouble breathing Edema is considerably better Went to ER after my visit due to severe anemia--got transfused Then has seen hematology---and starting iron infusions  Weighing daily Down 6# from last week Didn't start the furosemide for 2 days ---had been in the ER when they delivered it DOE is much less evident  Has had change in hair and nails--?related to low oxygen  Current Outpatient Medications on File Prior to Visit  Medication Sig Dispense Refill   acetaminophen (TYLENOL) 500 MG tablet Take 1,000 mg by mouth every 8 (eight) hours as needed.      albuterol (PROAIR HFA) 108 (90 Base) MCG/ACT inhaler INHALE 2 PUFFS INTO THE LUNGS EVERY 6 (SIX) HOURS AS NEEDED FOR WHEEZING. 8.5 each 1   ALPRAZolam (XANAX) 0.25 MG tablet Take 1 tablet (0.25 mg total) by mouth 2 (two) times daily as needed for anxiety. 20 tablet 0   apixaban (ELIQUIS) 5 MG TABS tablet Take 1 tablet (5 mg total) by mouth 2 (two) times daily. 180 tablet 2   Biotin (BIOTIN 5000) 5 MG CAPS Take by mouth.     cetirizine (ZYRTEC) 10 MG tablet Take 10 mg by mouth at bedtime.      Cholecalciferol (VITAMIN D3) 25 MCG (1000 UT) CAPS      docusate sodium (COLACE) 100 MG capsule Take 100 mg by mouth daily as needed.      etodolac (LODINE) 500 MG tablet TAKE ONE TABLET BY MOUTH TWICE DAILY AS NEEDED 180 tablet 0   fluticasone (FLONASE) 50 MCG/ACT nasal spray Place 2 sprays into both nostrils daily. 48 g 3   furosemide (LASIX) 40 MG tablet Take 1 tablet (40 mg total) by mouth daily as needed. 30 tablet 3   Glucos-Chondroit-Hyaluron-MSM (GLUCOSAMINE CHONDROITIN JOINT) TABS Take 2 tablets by mouth daily.     LORazepam (ATIVAN) 0.5 MG tablet TAKE ONE-HALF TO 1 TABLET BY MOUTH EVERYDAY AT BEDTIME 30 tablet 0   Menthol-Methyl Salicylate (SALONPAS PAIN RELIEF PATCH  EX) Apply topically. prn     niacin (VITAMIN B3) 500 MG tablet Take 500 mg by mouth at bedtime.     pantoprazole (PROTONIX) 40 MG tablet Take 1 tablet (40 mg total) by mouth 2 (two) times daily. 180 tablet 3   polyethylene glycol (MIRALAX / GLYCOLAX) packet Take 17 g by mouth daily as needed for moderate constipation.      tiZANidine (ZANAFLEX) 2 MG tablet TAKE ONE TABLET BY MOUTH EVERYDAY AT BEDTIME AS NEEDED FOR MUSCLE SPASMS 90 tablet 3   Vibegron (GEMTESA) 75 MG TABS Take 1 tablet (75 mg total) by mouth daily.     No current facility-administered medications on file prior to visit.    Allergies  Allergen Reactions   Prevpac [Amoxicill-Clarithro-Lansopraz] Other (See Comments)    Throat closed up   Latex Rash    Had issues when wearing powdered gloves all day long while working.   Duloxetine Other (See Comments)    Nausea, diarrhea and jittery   Tramadol     GI symptoms    Past Medical History:  Diagnosis Date   Allergic rhinitis, cause unspecified    Allergy    Anemia    Complication of anesthesia    Diverticulosis    DVT (deep venous thrombosis) (HCC)  x2.  Last in 9/21   Gastric ulcer    in past   GERD (gastroesophageal reflux disease)    Hemorrhoids, internal    Hyperlipidemia    Motion sickness    ocean ships   Osteoarthrosis involving, or with mention of more than one site, but not specified as generalized, multiple sites    PONV (postoperative nausea and vomiting)    Ulcer (traumatic) of oral mucosa    Unspecified venous (peripheral) insufficiency    Vertigo    No episodes over 10 years    Past Surgical History:  Procedure Laterality Date   ABDOMINAL HYSTERECTOMY  1996   BREAST BIOPSY  2003   CATARACT EXTRACTION W/PHACO Right 03/25/2022   Procedure: CATARACT EXTRACTION PHACO AND INTRAOCULAR LENS PLACEMENT (IOC) RIGHT  VIVITY LENS 22.90 01:57.1;  Surgeon: Galen Manila, MD;  Location: [facility];  Service: Ophthalmology;  Laterality: Right;   Latex   CHOLECYSTECTOMY  2000   COLONOSCOPY WITH PROPOFOL N/A 10/02/2015   Procedure: COLONOSCOPY WITH PROPOFOL;  Surgeon: Christena Deem, MD;  [facility]: [facility] ENDOSCOPY;  Service: Endoscopy;  Laterality: N/A;   COLONOSCOPY WITH PROPOFOL N/A 06/17/2018   Procedure: COLONOSCOPY WITH PROPOFOL;  Surgeon: Toledo, Boykin Nearing, MD;  Location: [facility] ENDOSCOPY;  Service: Gastroenterology;  Laterality: N/A;   DEXA  5/14   Normal   ESOPHAGOGASTRODUODENOSCOPY (EGD) WITH PROPOFOL N/A 06/17/2018   Procedure: ESOPHAGOGASTRODUODENOSCOPY (EGD) WITH PROPOFOL;  Surgeon: Toledo, Boykin Nearing, MD;  Location: [facility] ENDOSCOPY;  Service: Gastroenterology;  Laterality: N/A;   KNEE ARTHROSCOPY     right in 2007, left in 2011   PERIPHERAL VASCULAR THROMBECTOMY Left 08/27/2020   Procedure: PERIPHERAL VASCULAR THROMBECTOMY;  Surgeon: Annice Needy, MD;  Location: [facility] INVASIVE CV LAB;  Service: Cardiovascular;  Laterality: Left;   TONSILLECTOMY AND ADENOIDECTOMY  childhood    Family History  Problem Relation Age of Onset   Heart disease Mother    Heart disease Father    Diabetes Son 5       Type 1   Hypertension Son    Stroke Brother    Diabetes Other 2       type 1   Hyperlipidemia Neg Hx    Depression Neg Hx     Social History   Socioeconomic History   Marital status: Widowed    Spouse name: Not on file   Number of children: 1   Years of education: Not on file   Highest education level: Associate degree: academic program  Occupational History   Occupation: Armed forces operational officer    Comment: retired  Tobacco Use   Smoking status: Former    Current packs/day: 0.00    Types: Cigarettes    Quit date: 1990    Years since quitting: 34.6    Passive exposure: Never   Smokeless tobacco: Never  Vaping Use   Vaping status: Never Used  Substance and Sexual Activity   Alcohol use: Yes    Alcohol/week: 2.0 standard drinks of alcohol    Types: 2 Glasses of wine per week    Comment: glass of wine 2x/wk   Drug use: No    Sexual activity: Not on file  Other Topics Concern   Not on file  Social History Narrative   Husband died Mar 13, 2023   Has living will   Asks for Son Azucena Kuba to make decisions   Would accept resuscitation--but no prolonged ventilation   Doesn't want tube feeds if cognitively unaware   Social Determinants of Health  Financial Resource Strain: Low Risk  (07/30/2023)   Overall Financial Resource Strain (CARDIA)    Difficulty of Paying Living Expenses: Not hard at all  Food Insecurity: No Food Insecurity (07/30/2023)   Hunger Vital Sign    Worried About Running Out of Food in the Last Year: Never true    Ran Out of Food in the Last Year: Never true  Transportation Needs: No Transportation Needs (07/30/2023)   PRAPARE - Administrator, Civil Service (Medical): No    Lack of Transportation (Non-Medical): No  Physical Activity: Unknown (07/30/2023)   Exercise Vital Sign    Days of Exercise per Week: 0 days    Minutes of Exercise per Session: Not on file  Stress: No Stress Concern Present (07/30/2023)   [employer] of Occupational Health - Occupational Stress Questionnaire    Feeling of Stress : Only a little  Social Connections: Socially Isolated (07/30/2023)   Social Connection and Isolation Panel [NHANES]    Frequency of Communication with Friends and Family: More than three times a week    Frequency of Social Gatherings with Friends and Family: Twice a week    Attends Religious Services: Never    Database administrator or Organizations: No    Attends Engineer, structural: Not on file    Marital Status: Widowed  Intimate Partner Violence: Not At Risk (12/07/2022)   Humiliation, Afraid, Rape, and Kick questionnaire    Fear of Current or Ex-Partner: No    Emotionally Abused: No    Physically Abused: No    Sexually Abused: No   Review of Systems Eating okay No chest pain Sleep is only so-so----nothing new     Objective:   Physical Exam Constitutional:       Appearance: Normal appearance.  Cardiovascular:     Rate and Rhythm: Normal rate and regular rhythm.     Heart sounds: No murmur heard.    No gallop.  Pulmonary:     Effort: Pulmonary effort is normal.     Breath sounds: Normal breath sounds. No wheezing or rales.  Musculoskeletal:     Cervical back: Neck supple.     Comments: Trace edema only  Lymphadenopathy:     Cervical: No cervical adenopathy.  Neurological:     Mental Status: She is alert.            Assessment & Plan:

## 2023-07-30 NOTE — Assessment & Plan Note (Signed)
Partially fluid and partially anemia Both are better Will change the furosemide to 40mg  daily prn Home weight now 240#--this is probably about her dry weight (but might be some lower) Can take furosemide daily for a few more days to see if it goes down more Will need TSH with next blood work (forgot it)

## 2023-07-30 NOTE — Assessment & Plan Note (Signed)
Got transfused and now getting iron infusions with Dr Orlie Dakin

## 2023-07-30 NOTE — Telephone Encounter (Signed)
Transition Care Management Follow-up Telephone Call Date of discharge and from where: 07/22/2023 [facility] How have you been since you were released from the hospital? Patient stated she has had 2 iron infusions and is beginning to feel better. Any questions or concerns? No  Items Reviewed: Did the pt receive and understand the discharge instructions provided? Yes  Medications obtained and verified?  No medication prescribed. Other? No  Any new allergies since your discharge? No  Dietary orders reviewed? Yes Do you have support at home? Yes   Follow up appointments reviewed:  PCP Hospital f/u appt confirmed? Yes  Scheduled to see Tillman Abide, MD on 07/30/2023 @ [facility] [place]. Specialist Hospital f/u appt confirmed? Yes  Scheduled to see  on 07/24/2023 @ [facility] Are transportation arrangements needed?  Emailed information for [employer] transportation per patient request. If their condition worsens, is the pt aware to call PCP or go to the Emergency Dept.? Yes Was the patient provided with contact information for the PCP's office or ED? Yes Was to pt encouraged to call back with questions or concerns? Yes   [facility]  [facility] Population Health Community Resource Care Guide   [email or web address]  ?? [phone]   Website: [email or web address]  [email or web address]

## 2023-08-16 MED ORDER — LORAZEPAM 0.5 MG PO TABS: ORAL_TABLET | ORAL | 0 refills | Status: AC

## 2023-08-16 MED ORDER — ETODOLAC 500 MG PO TABS: 500.0000 mg | ORAL_TABLET | Freq: Two times a day (BID) | ORAL | 0 refills | Status: DC | PRN

## 2023-08-16 MED ORDER — APIXABAN 5 MG PO TABS: 5.0000 mg | ORAL_TABLET | Freq: Two times a day (BID) | ORAL | 3 refills | Status: AC

## 2023-08-16 MED ORDER — ALBUTEROL SULFATE HFA 108 (90 BASE) MCG/ACT IN AERS: INHALATION_SPRAY | RESPIRATORY_TRACT | 1 refills | Status: AC

## 2023-08-18 ENCOUNTER — Encounter: Payer: Self-pay | Admitting: Urology

## 2023-08-18 MED ORDER — GEMTESA 75 MG PO TABS: 1.0000 | ORAL_TABLET | Freq: Every day | ORAL | 11 refills | Status: DC

## 2023-08-19 ENCOUNTER — Other Ambulatory Visit: Payer: Self-pay | Admitting: *Deleted

## 2023-08-19 ENCOUNTER — Encounter: Payer: Self-pay | Admitting: Nurse Practitioner

## 2023-08-19 DIAGNOSIS — J9610 Chronic respiratory failure, unspecified whether with hypoxia or hypercapnia: Secondary | ICD-10-CM | POA: Diagnosis not present

## 2023-08-19 DIAGNOSIS — D500 Iron deficiency anemia secondary to blood loss (chronic): Secondary | ICD-10-CM

## 2023-08-19 DIAGNOSIS — R0902 Hypoxemia: Secondary | ICD-10-CM | POA: Diagnosis not present

## 2023-08-19 DIAGNOSIS — U071 COVID-19: Secondary | ICD-10-CM | POA: Diagnosis not present

## 2023-08-21 ENCOUNTER — Inpatient Hospital Stay: Payer: PPO | Admitting: Nurse Practitioner

## 2023-08-21 ENCOUNTER — Encounter: Payer: Self-pay | Admitting: Nurse Practitioner

## 2023-08-21 ENCOUNTER — Inpatient Hospital Stay: Payer: PPO | Attending: Oncology

## 2023-08-21 ENCOUNTER — Inpatient Hospital Stay: Payer: PPO

## 2023-08-21 VITALS — BP 146/88 | HR 77 | Temp 96.3°F | Resp 18 | Wt 136.4 lb

## 2023-08-21 DIAGNOSIS — D500 Iron deficiency anemia secondary to blood loss (chronic): Secondary | ICD-10-CM

## 2023-08-21 DIAGNOSIS — E538 Deficiency of other specified B group vitamins: Secondary | ICD-10-CM | POA: Diagnosis not present

## 2023-08-21 DIAGNOSIS — D509 Iron deficiency anemia, unspecified: Secondary | ICD-10-CM | POA: Insufficient documentation

## 2023-08-21 DIAGNOSIS — Z79899 Other long term (current) drug therapy: Secondary | ICD-10-CM | POA: Insufficient documentation

## 2023-08-21 LAB — CBC WITH DIFFERENTIAL/PLATELET
Abs Immature Granulocytes: 0.01 10*3/uL (ref 0.00–0.07)
Basophils Absolute: 0.1 10*3/uL (ref 0.0–0.1)
Basophils Relative: 1 %
Eosinophils Absolute: 0.2 10*3/uL (ref 0.0–0.5)
Eosinophils Relative: 4 %
HCT: 40.8 % (ref 36.0–46.0)
Hemoglobin: 12.5 g/dL (ref 12.0–15.0)
Immature Granulocytes: 0 %
Lymphocytes Relative: 29 %
Lymphs Abs: 1.6 10*3/uL (ref 0.7–4.0)
MCH: 25.7 pg — ABNORMAL LOW (ref 26.0–34.0)
MCHC: 30.6 g/dL (ref 30.0–36.0)
MCV: 83.8 fL (ref 80.0–100.0)
Monocytes Absolute: 0.5 10*3/uL (ref 0.1–1.0)
Monocytes Relative: 10 %
Neutro Abs: 3.1 10*3/uL (ref 1.7–7.7)
Neutrophils Relative %: 56 %
Platelets: 191 10*3/uL (ref 150–400)
RBC: 4.87 MIL/uL (ref 3.87–5.11)
WBC: 5.6 10*3/uL (ref 4.0–10.5)
nRBC: 0 % (ref 0.0–0.2)

## 2023-08-21 LAB — VITAMIN B12: Vitamin B-12: 325 pg/mL (ref 180–914)

## 2023-08-21 LAB — FERRITIN: Ferritin: 85 ng/mL (ref 11–307)

## 2023-08-21 LAB — IRON AND TIBC
Iron: 100 ug/dL (ref 28–170)
Saturation Ratios: 28 % (ref 10.4–31.8)
TIBC: 353 ug/dL (ref 250–450)
UIBC: 253 ug/dL

## 2023-08-21 MED ORDER — SODIUM CHLORIDE 0.9 % IV SOLN
Freq: Once | INTRAVENOUS | Status: AC
  Filled 2023-08-21: qty 250

## 2023-08-21 MED ORDER — SODIUM CHLORIDE 0.9 % IV SOLN
510.0000 mg | Freq: Once | INTRAVENOUS | Status: AC
  Administered 2023-08-21: 510 mg via INTRAVENOUS
  Filled 2023-08-21: qty 17

## 2023-08-21 NOTE — Patient Instructions (Addendum)
 Ferumoxytol Injection What is this medication? FERUMOXYTOL (FER ue MOX i tol) treats low levels of iron in your body (iron deficiency anemia). Iron is a mineral that plays an important role in making red blood cells, which carry oxygen from your lungs to the rest of your body. This medicine may be used for other purposes; ask your health care provider or pharmacist if you have questions. COMMON BRAND NAME(S): Feraheme What should I tell my care team before I take this medication? They need to know if you have any of these conditions: Anemia not caused by low iron levels High levels of iron in the blood Magnetic resonance imaging (MRI) test scheduled An unusual or allergic reaction to iron, other medications, foods, dyes, or preservatives Pregnant or trying to get pregnant Breastfeeding How should I use this medication? This medication is injected into a vein. It is given by your care team in a hospital or clinic setting. Talk to your care team the use of this medication in children. Special care may be needed. Overdosage: If you think you have taken too much of this medicine contact a poison control center or emergency room at once. NOTE: This medicine is only for you. Do not share this medicine with others. What if I miss a dose? It is important not to miss your dose. Call your care team if you are unable to keep an appointment. What may interact with this medication? Other iron products This list may not describe all possible interactions. Give your health care provider a list of all the medicines, herbs, non-prescription drugs, or dietary supplements you use. Also tell them if you smoke, drink alcohol, or use illegal drugs. Some items may interact with your medicine. What should I watch for while using this medication? Visit your care team regularly. Tell your care team if your symptoms do not start to get better or if they get worse. You may need blood work done while you are taking this  medication. You may need to follow a special diet. Talk to your care team. Foods that contain iron include: whole grains/cereals, dried fruits, beans, or peas, leafy green vegetables, and organ meats (liver, kidney). What side effects may I notice from receiving this medication? Side effects that you should report to your care team as soon as possible: Allergic reactions--skin rash, itching, hives, swelling of the face, lips, tongue, or throat Low blood pressure--dizziness, feeling faint or lightheaded, blurry vision Shortness of breath Side effects that usually do not require medical attention (report to your care team if they continue or are bothersome): Flushing Headache Joint pain Muscle pain Nausea Pain, redness, or irritation at injection site This list may not describe all possible side effects. Call your doctor for medical advice about side effects. You may report side effects to FDA at 1-800-FDA-1088. Where should I keep my medication? This medication is given in a hospital or clinic. It will not be stored at home. NOTE: This sheet is a summary. It may not cover all possible information. If you have questions about this medicine, talk to your doctor, pharmacist, or health care provider.  2024 Elsevier/Gold Standard (2023-05-08 00:00:00)

## 2023-08-21 NOTE — Progress Notes (Signed)
[facility]  [phone] [phone]  ID: Alice Donaldson OB: 10-01-1946  MR#: [id number]  AOZ#:[id number]  Patient Care Team: Karie Schwalbe, MD as PCP - General (Pediatrics) Jeralyn Ruths, MD as Consulting Physician (Hematology and Oncology) Kathyrn Sheriff, [facility] (Inactive) as Pharmacist (Pharmacist)  CHIEF COMPLAINT: Iron deficiency anemia & lung nodule  INTERVAL HISTORY: Patient is 77 year old female who returns to clinic for follow up. She was in ER on 07/21/23 for 3 week history of shortness of breath and was found to have hemoglobin 6.4. She received 2 units pRBCs. Rectal exam was negative. She was asymptomatic of bleeding and was discharged home. In interim she has received feraheme x 2. She is feeling better. Ongoing fatigue but she is not requiring as much oxygen and her fatigue is improving.   REVIEW OF SYSTEMS:   Review of Systems  Constitutional:  Positive for malaise/fatigue. Negative for chills, fever and weight loss.  HENT:  Negative for nosebleeds.   Respiratory: Negative.  Negative for cough, hemoptysis and shortness of breath.   Cardiovascular: Negative.  Negative for chest pain, palpitations and leg swelling.  Gastrointestinal: Negative.  Negative for abdominal pain, blood in stool, constipation, diarrhea, melena, nausea and vomiting.  Genitourinary: Negative.  Negative for dysuria and hematuria.  Musculoskeletal: Negative.  Negative for back pain and falls.  Skin: Negative.  Negative for rash.  Neurological:  Positive for weakness. Negative for sensory change, focal weakness and headaches.  Endo/Heme/Allergies:  Bruises/bleeds easily.  Psychiatric/Behavioral: Negative.  Negative for depression. The patient is not nervous/anxious.   As per HPI. Otherwise, a complete review of systems is negative.  PAST MEDICAL HISTORY: Past Medical History:  Diagnosis Date   Allergic rhinitis, cause unspecified    Allergy    Anemia     Complication of anesthesia    Diverticulosis    DVT (deep venous thrombosis) (HCC)    x2.  Last in 9/21   Gastric ulcer    in past   GERD (gastroesophageal reflux disease)    Hemorrhoids, internal    Hyperlipidemia    Motion sickness    ocean ships   Osteoarthrosis involving, or with mention of more than one site, but not specified as generalized, multiple sites    PONV (postoperative nausea and vomiting)    Ulcer (traumatic) of oral mucosa    Unspecified venous (peripheral) insufficiency    Vertigo    No episodes over 10 years    PAST SURGICAL HISTORY: Past Surgical History:  Procedure Laterality Date   ABDOMINAL HYSTERECTOMY  1996   BREAST BIOPSY  2003   CATARACT EXTRACTION W/PHACO Right 03/25/2022   Procedure: CATARACT EXTRACTION PHACO AND INTRAOCULAR LENS PLACEMENT (IOC) RIGHT  VIVITY LENS 22.90 01:57.1;  Surgeon: Galen Manila, MD;  Location: [facility];  Service: Ophthalmology;  Laterality: Right;  Latex   CHOLECYSTECTOMY  2000   COLONOSCOPY WITH PROPOFOL N/A 10/02/2015   Procedure: COLONOSCOPY WITH PROPOFOL;  Surgeon: Christena Deem, MD;  Location: [facility] ENDOSCOPY;  Service: Endoscopy;  Laterality: N/A;   COLONOSCOPY WITH PROPOFOL N/A 06/17/2018   Procedure: COLONOSCOPY WITH PROPOFOL;  Surgeon: Toledo, Boykin Nearing, MD;  Location: [facility] ENDOSCOPY;  Service: Gastroenterology;  Laterality: N/A;   DEXA  5/14   Normal   ESOPHAGOGASTRODUODENOSCOPY (EGD) WITH PROPOFOL N/A 06/17/2018   Procedure: ESOPHAGOGASTRODUODENOSCOPY (EGD) WITH PROPOFOL;  Surgeon: Toledo, Boykin Nearing, MD;  Location: [facility] ENDOSCOPY;  Service: Gastroenterology;  Laterality: N/A;   KNEE ARTHROSCOPY  right in 2007, left in 2011   PERIPHERAL VASCULAR THROMBECTOMY Left 08/27/2020   Procedure: PERIPHERAL VASCULAR THROMBECTOMY;  Surgeon: Annice Needy, MD;  Location: [facility] INVASIVE CV LAB;  Service: Cardiovascular;  Laterality: Left;   TONSILLECTOMY AND ADENOIDECTOMY  childhood    FAMILY  HISTORY: Family History  Problem Relation Age of Onset   Heart disease Mother    Heart disease Father    Diabetes Son 5       Type 1   Hypertension Son    Stroke Brother    Diabetes Other 2       type 1   Hyperlipidemia Neg Hx    Depression Neg Hx     ADVANCED DIRECTIVES (Y/N):  N  HEALTH MAINTENANCE: Social History   Tobacco Use   Smoking status: Former    Current packs/day: 0.00    Types: Cigarettes    Quit date: 1990    Years since quitting: 34.7    Passive exposure: Never   Smokeless tobacco: Never  Vaping Use   Vaping status: Never Used  Substance Use Topics   Alcohol use: Yes    Alcohol/week: 2.0 standard drinks of alcohol    Types: 2 Glasses of wine per week    Comment: glass of wine 2x/wk   Drug use: No     Colonoscopy:  PAP:  Bone density:  Lipid panel:  Allergies  Allergen Reactions   Prevpac [Amoxicill-Clarithro-Lansopraz] Other (See Comments)    Throat closed up   Latex Rash    Had issues when wearing powdered gloves all day long while working.   Duloxetine Other (See Comments)    Nausea, diarrhea and jittery   Tramadol     GI symptoms    Current Outpatient Medications  Medication Sig Dispense Refill   acetaminophen (TYLENOL) 500 MG tablet Take 1,000 mg by mouth every 8 (eight) hours as needed.      albuterol (PROAIR HFA) 108 (90 Base) MCG/ACT inhaler INHALE 2 PUFFS INTO THE LUNGS EVERY 6 (SIX) HOURS AS NEEDED FOR WHEEZING. 8.5 each 1   ALPRAZolam (XANAX) 0.25 MG tablet Take 1 tablet (0.25 mg total) by mouth 2 (two) times daily as needed for anxiety. 20 tablet 0   apixaban (ELIQUIS) 5 MG TABS tablet Take 1 tablet (5 mg total) by mouth 2 (two) times daily. 180 tablet 3   Biotin (BIOTIN 5000) 5 MG CAPS Take by mouth.     cetirizine (ZYRTEC) 10 MG tablet Take 10 mg by mouth at bedtime.      Cholecalciferol (VITAMIN D3) 25 MCG (1000 UT) CAPS      docusate sodium (COLACE) 100 MG capsule Take 100 mg by mouth daily as needed.      etodolac  (LODINE) 500 MG tablet Take 1 tablet (500 mg total) by mouth 2 (two) times daily as needed. 180 tablet 0   fluticasone (FLONASE) 50 MCG/ACT nasal spray Place 2 sprays into both nostrils daily. 48 g 3   furosemide (LASIX) 40 MG tablet Take 1 tablet (40 mg total) by mouth daily as needed. 30 tablet 3   Glucos-Chondroit-Hyaluron-MSM (GLUCOSAMINE CHONDROITIN JOINT) TABS Take 2 tablets by mouth daily.     LORazepam (ATIVAN) 0.5 MG tablet TAKE ONE-HALF TO 1 TABLET BY MOUTH EVERYDAY AT BEDTIME 30 tablet 0   Menthol-Methyl Salicylate (SALONPAS PAIN RELIEF PATCH EX) Apply topically. prn     niacin (VITAMIN B3) 500 MG tablet Take 500 mg by mouth at bedtime.     pantoprazole (PROTONIX)  40 MG tablet Take 1 tablet (40 mg total) by mouth 2 (two) times daily. 180 tablet 3   polyethylene glycol (MIRALAX / GLYCOLAX) packet Take 17 g by mouth daily as needed for moderate constipation.      tiZANidine (ZANAFLEX) 2 MG tablet TAKE ONE TABLET BY MOUTH EVERYDAY AT BEDTIME AS NEEDED FOR MUSCLE SPASMS 90 tablet 3   Vibegron (GEMTESA) 75 MG TABS Take 1 tablet (75 mg total) by mouth daily. 30 tablet 11   No current facility-administered medications for this visit.    OBJECTIVE: Vitals:   08/21/23 1348  BP: (!) 146/88  Pulse: 77  Resp: 18  Temp: (!) 96.3 F (35.7 C)  SpO2: 93%     Body mass index is 23.41 kg/m.    ECOG FS:1 - Symptomatic but completely ambulatory  General: Well-developed, well-nourished, no acute distress. Eyes: Pink conjunctiva, anicteric sclera. Lungs: Clear to auscultation bilaterally.  No audible wheezing or coughing Heart: Regular rate and rhythm.  Abdomen: Soft, nontender, nondistended.  Musculoskeletal: No edema, cyanosis, or clubbing. Neuro: Alert, answering all questions appropriately. Cranial nerves grossly intact. Skin: No rashes or petechiae noted. Psych: Normal affect.   LAB RESULTS: Lab Results  Component Value Date   NA 137 07/24/2023   K 3.8 07/24/2023   CL 107  07/24/2023   CO2 23 07/24/2023   GLUCOSE 99 07/24/2023   BUN 22 07/24/2023   CREATININE 0.91 07/24/2023   CALCIUM 9.3 07/24/2023   PROT 6.9 07/24/2023   ALBUMIN 3.8 07/24/2023   AST 20 07/24/2023   ALT 17 07/24/2023   ALKPHOS 104 07/24/2023   BILITOT 0.6 07/24/2023   GFRNONAA >60 07/24/2023   GFRAA >60 08/27/2020   Lab Results  Component Value Date   WBC 5.6 08/21/2023   NEUTROABS 3.1 08/21/2023   HGB 12.5 08/21/2023   HCT 40.8 08/21/2023   MCV 83.8 08/21/2023   PLT 191 08/21/2023   Lab Results  Component Value Date   IRON 17 (L) 07/24/2023   TIBC 503 (H) 07/24/2023   IRONPCTSAT 3 (L) 07/24/2023   Lab Results  Component Value Date   FERRITIN 6 (L) 07/24/2023     STUDIES: No results found.  ASSESSMENT: Iron deficiency anemia.  PLAN:    1.  Iron deficiency anemia: Etiology: see below. Last feraheme 2022 then in August 2024, hemoglobin worsened to 6.4, ferritin 3, iron sat 3%. She received feraheme 500 mg x 2. Tolerated well. Hemoglobin has improved to 12.5. Iron studies pending at time of visit. Plan for feraheme today then will base additional doses on results of iron stores. Clinically no evidence of additional bleeding. Plan to recheck counts in 3 months.   2. Etiology of iron deficiency- History of chronic gastritis, diverticulosis and internal hemorrhoids and hiatal hernia. Last egd, colonoscopy and capsule study were performed in 2019 with no obvious bleeding at that time. She is due for screening colonoscopy later this year but is reluctant to undergo additional procedures given previous negative results and given her current symptoms. We can hold GI for now but reviewed that she may need to reconsider if she has repeat bleeding given that her last workup was 5+ years ago. No apparent vaginal bleeding or hematuria.   3. Symptomatic anemia- hemoglobin 6.4 (8.6/24). s/p 2 units pRBCs. Resolved.   4. B12 Deficiency- last b12 injection on 02/28/21. B12 today is 336.  Improved. Could consider injections in the future. Hold for now.   5. Thrombocytopenia- resolved.   6. Family history  of multiple myeloma- Brother. Reviewed that there is nothing currently in her lab work suggestive of myeloma. No indication for additional workup at this time.   7. Lung nodule- she has a history of pulmonary nodules and has been followed by lung nodule clinic. CT chest wo contrast from 07/17/23 was reviewed independently and lung nodules appear stable. Largest nodule measures 0.4 cm in RLL and is unchanged. No adenopathy and no evidence of malignancy. Nodules have been stable for several years and are stable reflecting likely benign etiology. No additional imaging or surveillance recommended.   8. Hiatal hernia- chronic. Large. Likely contributing to iron deficiency. Discussed how hernia can cause malabsorption of oral iron and contribute to IDA today.   9. Incidental findings- on ct. Trace posterial left pleural effusion (possibly secondary to recent pneumonia), coronary atherosclerosis. Recommended she follow up with pcp for management.   10. DVT and Recurrent pulmonary emboli- on eliquis. Continue.   Disposition: Feraheme today  3 mo- labs (cbc, ferritin, iron studies), Dr. Orlie Dakin, +/- feraheme- la  Patient expressed understanding and was in agreement with this plan. She also understands that She can call clinic at any time with any questions, concerns, or complaints.   Alinda Dooms, NP 08/21/2023

## 2023-08-27 ENCOUNTER — Ambulatory Visit (INDEPENDENT_AMBULATORY_CARE_PROVIDER_SITE_OTHER): Payer: PPO | Admitting: Internal Medicine

## 2023-08-27 ENCOUNTER — Encounter: Payer: Self-pay | Admitting: Internal Medicine

## 2023-08-27 VITALS — BP 134/88 | HR 70 | Temp 98.1°F | Ht 64.0 in | Wt 237.0 lb

## 2023-08-27 DIAGNOSIS — Z23 Encounter for immunization: Secondary | ICD-10-CM

## 2023-08-27 DIAGNOSIS — I872 Venous insufficiency (chronic) (peripheral): Secondary | ICD-10-CM | POA: Diagnosis not present

## 2023-08-27 DIAGNOSIS — I5032 Chronic diastolic (congestive) heart failure: Secondary | ICD-10-CM | POA: Diagnosis not present

## 2023-08-27 NOTE — Addendum Note (Signed)
Addended by: Eual Fines on: 08/27/2023 03:15 PM   Modules accepted: Orders

## 2023-08-27 NOTE — Assessment & Plan Note (Signed)
Better now Using furosemide most days now --40mg 

## 2023-08-27 NOTE — Progress Notes (Signed)
Subjective:    Patient ID: Alice Donaldson, female    DOB: 1946-12-15, 77 y.o.   MRN: [id number]  HPI Here for follow up of CHF and anemia  Is feeling better Last blood count back to normal Now has the energy to do things again Still gets occasional SOB--but much less evident Swelling is down if takes the furosemide---but goes away when she takes it No chest pain  Current Outpatient Medications on File Prior to Visit  Medication Sig Dispense Refill   acetaminophen (TYLENOL) 500 MG tablet Take 1,000 mg by mouth every 8 (eight) hours as needed.      albuterol (PROAIR HFA) 108 (90 Base) MCG/ACT inhaler INHALE 2 PUFFS INTO THE LUNGS EVERY 6 (SIX) HOURS AS NEEDED FOR WHEEZING. 8.5 each 1   ALPRAZolam (XANAX) 0.25 MG tablet Take 1 tablet (0.25 mg total) by mouth 2 (two) times daily as needed for anxiety. 20 tablet 0   apixaban (ELIQUIS) 5 MG TABS tablet Take 1 tablet (5 mg total) by mouth 2 (two) times daily. 180 tablet 3   Biotin (BIOTIN 5000) 5 MG CAPS Take by mouth.     cetirizine (ZYRTEC) 10 MG tablet Take 10 mg by mouth at bedtime.      Cholecalciferol (VITAMIN D3) 25 MCG (1000 UT) CAPS      docusate sodium (COLACE) 100 MG capsule Take 100 mg by mouth daily as needed.      etodolac (LODINE) 500 MG tablet Take 1 tablet (500 mg total) by mouth 2 (two) times daily as needed. 180 tablet 0   fluticasone (FLONASE) 50 MCG/ACT nasal spray Place 2 sprays into both nostrils daily. 48 g 3   furosemide (LASIX) 40 MG tablet Take 1 tablet (40 mg total) by mouth daily as needed. 30 tablet 3   Glucos-Chondroit-Hyaluron-MSM (GLUCOSAMINE CHONDROITIN JOINT) TABS Take 2 tablets by mouth daily.     LORazepam (ATIVAN) 0.5 MG tablet TAKE ONE-HALF TO 1 TABLET BY MOUTH EVERYDAY AT BEDTIME 30 tablet 0   Menthol-Methyl Salicylate (SALONPAS PAIN RELIEF PATCH EX) Apply topically. prn     niacin (VITAMIN B3) 500 MG tablet Take 500 mg by mouth at bedtime.     pantoprazole (PROTONIX) 40 MG tablet Take 1 tablet (40 mg  total) by mouth 2 (two) times daily. 180 tablet 3   polyethylene glycol (MIRALAX / GLYCOLAX) packet Take 17 g by mouth daily as needed for moderate constipation.      tiZANidine (ZANAFLEX) 2 MG tablet TAKE ONE TABLET BY MOUTH EVERYDAY AT BEDTIME AS NEEDED FOR MUSCLE SPASMS 90 tablet 3   Vibegron (GEMTESA) 75 MG TABS Take 1 tablet (75 mg total) by mouth daily. 30 tablet 11   No current facility-administered medications on file prior to visit.    Allergies  Allergen Reactions   Prevpac [Amoxicill-Clarithro-Lansopraz] Other (See Comments)    Throat closed up   Latex Rash    Had issues when wearing powdered gloves all day long while working.   Duloxetine Other (See Comments)    Nausea, diarrhea and jittery   Tramadol     GI symptoms    Past Medical History:  Diagnosis Date   Allergic rhinitis, cause unspecified    Allergy    Anemia    Complication of anesthesia    Diverticulosis    DVT (deep venous thrombosis) (HCC)    x2.  Last in 9/21   Gastric ulcer    in past   GERD (gastroesophageal reflux disease)  Hemorrhoids, internal    Hyperlipidemia    Motion sickness    ocean ships   Osteoarthrosis involving, or with mention of more than one site, but not specified as generalized, multiple sites    PONV (postoperative nausea and vomiting)    Ulcer (traumatic) of oral mucosa    Unspecified venous (peripheral) insufficiency    Vertigo    No episodes over 10 years    Past Surgical History:  Procedure Laterality Date   ABDOMINAL HYSTERECTOMY  1996   BREAST BIOPSY  2003   CATARACT EXTRACTION W/PHACO Right 03/25/2022   Procedure: CATARACT EXTRACTION PHACO AND INTRAOCULAR LENS PLACEMENT (IOC) RIGHT  VIVITY LENS 22.90 01:57.1;  Surgeon: Galen Manila, MD;  Location: [facility];  Service: Ophthalmology;  Laterality: Right;  Latex   CHOLECYSTECTOMY  2000   COLONOSCOPY WITH PROPOFOL N/A 10/02/2015   Procedure: COLONOSCOPY WITH PROPOFOL;  Surgeon: Christena Deem, MD;   Location: [facility] ENDOSCOPY;  Service: Endoscopy;  Laterality: N/A;   COLONOSCOPY WITH PROPOFOL N/A 06/17/2018   Procedure: COLONOSCOPY WITH PROPOFOL;  Surgeon: Toledo, Boykin Nearing, MD;  Location: [facility] ENDOSCOPY;  Service: Gastroenterology;  Laterality: N/A;   DEXA  5/14   Normal   ESOPHAGOGASTRODUODENOSCOPY (EGD) WITH PROPOFOL N/A 06/17/2018   Procedure: ESOPHAGOGASTRODUODENOSCOPY (EGD) WITH PROPOFOL;  Surgeon: Toledo, Boykin Nearing, MD;  Location: [facility] ENDOSCOPY;  Service: Gastroenterology;  Laterality: N/A;   KNEE ARTHROSCOPY     right in 2007, left in 2011   PERIPHERAL VASCULAR THROMBECTOMY Left 08/27/2020   Procedure: PERIPHERAL VASCULAR THROMBECTOMY;  Surgeon: Annice Needy, MD;  Location: [facility] INVASIVE CV LAB;  Service: Cardiovascular;  Laterality: Left;   TONSILLECTOMY AND ADENOIDECTOMY  childhood    Family History  Problem Relation Age of Onset   Heart disease Mother    Heart disease Father    Diabetes Son 5       Type 1   Hypertension Son    Stroke Brother    Diabetes Other 2       type 1   Hyperlipidemia Neg Hx    Depression Neg Hx     Social History   Socioeconomic History   Marital status: Widowed    Spouse name: Not on file   Number of children: 1   Years of education: Not on file   Highest education level: Associate degree: academic program  Occupational History   Occupation: Armed forces operational officer    Comment: retired  Tobacco Use   Smoking status: Former    Current packs/day: 0.00    Types: Cigarettes    Quit date: 1990    Years since quitting: 34.7    Passive exposure: Never   Smokeless tobacco: Never  Vaping Use   Vaping status: Never Used  Substance and Sexual Activity   Alcohol use: Yes    Alcohol/week: 2.0 standard drinks of alcohol    Types: 2 Glasses of wine per week    Comment: glass of wine 2x/wk   Drug use: No   Sexual activity: Not on file  Other Topics Concern   Not on file  Social History Narrative   Husband died 2023-03-20   Has living will   Asks for  Son Azucena Kuba to make decisions   Would accept resuscitation--but no prolonged ventilation   Doesn't want tube feeds if cognitively unaware   Social Determinants of Health   Financial Resource Strain: Low Risk  (07/30/2023)   Overall Financial Resource Strain (CARDIA)    Difficulty of Paying Living Expenses: Not  hard at all  Food Insecurity: No Food Insecurity (07/30/2023)   Hunger Vital Sign    Worried About Running Out of Food in the Last Year: Never true    Ran Out of Food in the Last Year: Never true  Transportation Needs: No Transportation Needs (07/30/2023)   PRAPARE - Administrator, Civil Service (Medical): No    Lack of Transportation (Non-Medical): No  Physical Activity: Unknown (07/30/2023)   Exercise Vital Sign    Days of Exercise per Week: 0 days    Minutes of Exercise per Session: Not on file  Stress: No Stress Concern Present (07/30/2023)   [employer] of Occupational Health - Occupational Stress Questionnaire    Feeling of Stress : Only a little  Social Connections: Socially Isolated (07/30/2023)   Social Connection and Isolation Panel [NHANES]    Frequency of Communication with Friends and Family: More than three times a week    Frequency of Social Gatherings with Friends and Family: Twice a week    Attends Religious Services: Never    Database administrator or Organizations: No    Attends Engineer, structural: Not on file    Marital Status: Widowed  Intimate Partner Violence: Not At Risk (12/07/2022)   Humiliation, Afraid, Rape, and Kick questionnaire    Fear of Current or Ex-Partner: No    Emotionally Abused: No    Physically Abused: No    Sexually Abused: No   Review of Systems Is weighing daily--correlates well with the fluid Appetite is very good    Objective:   Physical Exam Constitutional:      Appearance: Normal appearance.  Cardiovascular:     Rate and Rhythm: Normal rate and regular rhythm.     Heart sounds: No murmur heard.     No gallop.  Pulmonary:     Effort: Pulmonary effort is normal.     Breath sounds: Normal breath sounds. No wheezing or rales.  Musculoskeletal:     Cervical back: Neck supple.     Comments: Chronic left calf swelling---can be really painful at night Some ankle/foot puffiness  Lymphadenopathy:     Cervical: No cervical adenopathy.  Neurological:     Mental Status: She is alert.            Assessment & Plan:

## 2023-08-27 NOTE — Addendum Note (Signed)
Addended by: Tillman Abide I on: 08/27/2023 02:56 PM   Modules accepted: Level of Service

## 2023-08-27 NOTE — Assessment & Plan Note (Signed)
Having more swelling in left calf again---seems stable recently Advised her to check back in with Dr Wyn Quaker

## 2023-08-28 ENCOUNTER — Telehealth (INDEPENDENT_AMBULATORY_CARE_PROVIDER_SITE_OTHER): Payer: Self-pay

## 2023-08-28 LAB — RENAL FUNCTION PANEL
Albumin: 3.8 g/dL (ref 3.5–5.2)
BUN: 21 mg/dL (ref 6–23)
CO2: 26 meq/L (ref 19–32)
Calcium: 9.7 mg/dL (ref 8.4–10.5)
Chloride: 108 meq/L (ref 96–112)
Creatinine, Ser: 0.97 mg/dL (ref 0.40–1.20)
GFR: 55.83 mL/min — ABNORMAL LOW (ref >60.00)
Glucose, Bld: 91 mg/dL (ref 70–99)
Phosphorus: 3.2 mg/dL (ref 2.3–4.6)
Potassium: 4.6 meq/L (ref 3.5–5.1)
Sodium: 142 meq/L (ref 135–145)

## 2023-08-28 LAB — TSH: TSH: 2.48 u[IU]/mL (ref 0.35–5.50)

## 2023-08-28 NOTE — Telephone Encounter (Signed)
She can come in for a DVT study to see [facility]

## 2023-08-28 NOTE — Telephone Encounter (Signed)
Patient called stating she had a left leg venous thrombectomy DVT on 08/27/2020 and now has swelling from the stent down to her foot, "the cath is hard and big". She had an appointment with internal medicine yesterday and Dr. Alphonsus Sias told her she should come and get it checked. She is requesting an appointment with Dr. Wyn Quaker but will see you Alice Donaldson) if he is not available.   Please advise

## 2023-08-31 NOTE — Telephone Encounter (Signed)
Pt will be coming in at 10:30 tomorrow for a DVT and see Sheppard Plumber, NP

## 2023-09-01 ENCOUNTER — Ambulatory Visit (INDEPENDENT_AMBULATORY_CARE_PROVIDER_SITE_OTHER): Payer: PPO

## 2023-09-01 ENCOUNTER — Other Ambulatory Visit (INDEPENDENT_AMBULATORY_CARE_PROVIDER_SITE_OTHER): Payer: Self-pay | Admitting: Vascular Surgery

## 2023-09-01 ENCOUNTER — Ambulatory Visit (INDEPENDENT_AMBULATORY_CARE_PROVIDER_SITE_OTHER): Payer: PPO | Admitting: Nurse Practitioner

## 2023-09-01 VITALS — BP 165/87 | HR 67 | Resp 19 | Ht 65.0 in | Wt 246.6 lb

## 2023-09-01 DIAGNOSIS — M48062 Spinal stenosis, lumbar region with neurogenic claudication: Secondary | ICD-10-CM

## 2023-09-01 DIAGNOSIS — M7989 Other specified soft tissue disorders: Secondary | ICD-10-CM

## 2023-09-01 DIAGNOSIS — I82412 Acute embolism and thrombosis of left femoral vein: Secondary | ICD-10-CM | POA: Diagnosis not present

## 2023-09-02 ENCOUNTER — Encounter (INDEPENDENT_AMBULATORY_CARE_PROVIDER_SITE_OTHER): Payer: Self-pay | Admitting: Nurse Practitioner

## 2023-09-02 NOTE — Progress Notes (Signed)
Subjective:    Patient ID: Alice Donaldson, female    DOB: 1946-04-06, 77 y.o.   MRN: [id number] Chief Complaint  Patient presents with   Venous Insufficiency    Alice Donaldson is a 77 year old female who presents today for evaluation of swelling of her left lower extremity.  The patient ever since her thrombus in her issues of swelling much worse and she notes that she wears compression socks sporadically he has an elevation that she is not eating exercise significantly and she does have a lymphedema pump but is unsure of its location currently.  No evidence of cellulitis in the left lower extremity.  Today noninvasive study showed no evidence of DVT or superficial thrombus in his left lower extremity    Review of Systems  Cardiovascular:  Positive for leg swelling.  All other systems reviewed and are negative.      Objective:   Physical Exam Vitals reviewed.  HENT:     Head: Normocephalic.  Cardiovascular:     Rate and Rhythm: Normal rate.  Pulmonary:     Effort: Pulmonary effort is normal.  Musculoskeletal:     Left lower leg: Edema present.  Skin:    General: Skin is warm and dry.     Capillary Refill: Capillary refill takes more than 3 seconds.  Neurological:     Mental Status: She is alert.     BP (!) 165/87 (BP Location: Left Arm)   Pulse 67   Resp 19   Ht 5\' 5"  (1.651 m)   Wt 246 lb 9.6 oz (111.9 kg)   LMP  (LMP Unknown)   BMI 41.04 kg/m     Past Medical History:  Diagnosis Date   Allergic rhinitis, cause unspecified    Allergy    Anemia    Complication of anesthesia    Diverticulosis    DVT (deep venous thrombosis) (HCC)    x2.  Last in 9/21   Gastric ulcer    in past   GERD (gastroesophageal reflux disease)    Hemorrhoids, internal    Hyperlipidemia    Motion sickness    ocean ships   Osteoarthrosis involving, or with mention of more than one site, but not specified as generalized, multiple sites    PONV (postoperative nausea and vomiting)     Ulcer (traumatic) of oral mucosa    Unspecified venous (peripheral) insufficiency    Vertigo    No episodes over 10 years    Social History   Socioeconomic History   Marital status: Widowed    Spouse name: Not on file   Number of children: 1   Years of education: Not on file   Highest education level: Associate degree: academic program  Occupational History   Occupation: Armed forces operational officer    Comment: retired  Tobacco Use   Smoking status: Former    Current packs/day: 0.00    Types: Cigarettes    Quit date: 1990    Years since quitting: 34.7    Passive exposure: Never   Smokeless tobacco: Never  Vaping Use   Vaping status: Never Used  Substance and Sexual Activity   Alcohol use: Yes    Alcohol/week: 2.0 standard drinks of alcohol    Types: 2 Glasses of wine per week    Comment: glass of wine 2x/wk   Drug use: No   Sexual activity: Not on file  Other Topics Concern   Not on file  Social History Narrative   Husband died 03-18-2023  Has living will   Asks for Sylvie Farrier to make decisions   Would accept resuscitation--but no prolonged ventilation   Doesn't want tube feeds if cognitively unaware   Social Determinants of Health   Financial Resource Strain: Low Risk  (07/30/2023)   Overall Financial Resource Strain (CARDIA)    Difficulty of Paying Living Expenses: Not hard at all  Food Insecurity: No Food Insecurity (07/30/2023)   Hunger Vital Sign    Worried About Running Out of Food in the Last Year: Never true    Ran Out of Food in the Last Year: Never true  Transportation Needs: No Transportation Needs (07/30/2023)   PRAPARE - Administrator, Civil Service (Medical): No    Lack of Transportation (Non-Medical): No  Physical Activity: Unknown (07/30/2023)   Exercise Vital Sign    Days of Exercise per Week: 0 days    Minutes of Exercise per Session: Not on file  Stress: No Stress Concern Present (07/30/2023)   [employer] of Occupational Health -  Occupational Stress Questionnaire    Feeling of Stress : Only a little  Social Connections: Socially Isolated (07/30/2023)   Social Connection and Isolation Panel [NHANES]    Frequency of Communication with Friends and Family: More than three times a week    Frequency of Social Gatherings with Friends and Family: Twice a week    Attends Religious Services: Never    Database administrator or Organizations: No    Attends Engineer, structural: Not on file    Marital Status: Widowed  Intimate Partner Violence: Not At Risk (12/07/2022)   Humiliation, Afraid, Rape, and Kick questionnaire    Fear of Current or Ex-Partner: No    Emotionally Abused: No    Physically Abused: No    Sexually Abused: No    Past Surgical History:  Procedure Laterality Date   ABDOMINAL HYSTERECTOMY  1996   BREAST BIOPSY  2003   CATARACT EXTRACTION W/PHACO Right 03/25/2022   Procedure: CATARACT EXTRACTION PHACO AND INTRAOCULAR LENS PLACEMENT (IOC) RIGHT  VIVITY LENS 22.90 01:57.1;  Surgeon: Galen Manila, MD;  Location: [facility];  Service: Ophthalmology;  Laterality: Right;  Latex   CHOLECYSTECTOMY  2000   COLONOSCOPY WITH PROPOFOL N/A 10/02/2015   Procedure: COLONOSCOPY WITH PROPOFOL;  Surgeon: Christena Deem, MD;  Location: [facility] ENDOSCOPY;  Service: Endoscopy;  Laterality: N/A;   COLONOSCOPY WITH PROPOFOL N/A 06/17/2018   Procedure: COLONOSCOPY WITH PROPOFOL;  Surgeon: Toledo, Boykin Nearing, MD;  Location: [facility] ENDOSCOPY;  Service: Gastroenterology;  Laterality: N/A;   DEXA  5/14   Normal   ESOPHAGOGASTRODUODENOSCOPY (EGD) WITH PROPOFOL N/A 06/17/2018   Procedure: ESOPHAGOGASTRODUODENOSCOPY (EGD) WITH PROPOFOL;  Surgeon: Toledo, Boykin Nearing, MD;  Location: [facility] ENDOSCOPY;  Service: Gastroenterology;  Laterality: N/A;   KNEE ARTHROSCOPY     right in 2007, left in 2011   PERIPHERAL VASCULAR THROMBECTOMY Left 08/27/2020   Procedure: PERIPHERAL VASCULAR THROMBECTOMY;  Surgeon: Annice Needy, MD;   Location: [facility] INVASIVE CV LAB;  Service: Cardiovascular;  Laterality: Left;   TONSILLECTOMY AND ADENOIDECTOMY  childhood    Family History  Problem Relation Age of Onset   Heart disease Mother    Heart disease Father    Diabetes Son 5       Type 1   Hypertension Son    Stroke Brother    Diabetes Other 2       type 1   Hyperlipidemia Neg Hx    Depression Neg  Hx     Allergies  Allergen Reactions   Prevpac [Amoxicill-Clarithro-Lansopraz] Other (See Comments)    Throat closed up   Latex Rash    Had issues when wearing powdered gloves all day long while working.   Duloxetine Other (See Comments)    Nausea, diarrhea and jittery   Tramadol     GI symptoms       Latest Ref Rng & Units 08/21/2023    1:24 PM 07/24/2023    9:18 AM 07/21/2023    9:17 PM  CBC  WBC 4.0 - 10.5 K/uL 5.6  5.9    Hemoglobin 12.0 - 15.0 g/dL 16.1  8.4  7.1   Hematocrit 36.0 - 46.0 % 40.8  29.4  24.7   Platelets 150 - 400 K/uL 191  334        CMP     Component Value Date/Time   NA 142 08/27/2023 1450   NA 140 05/12/2014 0936   K 4.6 08/27/2023 1450   K 3.9 05/12/2014 0936   CL 108 08/27/2023 1450   CL 111 (H) 05/12/2014 0936   CO2 26 08/27/2023 1450   CO2 26 05/12/2014 0936   GLUCOSE 91 08/27/2023 1450   GLUCOSE 123 (H) 05/12/2014 0936   BUN 21 08/27/2023 1450   BUN 15 05/12/2014 0936   CREATININE 0.97 08/27/2023 1450   CREATININE 1.03 05/12/2014 0936   CALCIUM 9.7 08/27/2023 1450   CALCIUM 9.1 05/12/2014 0936   PROT 6.9 07/24/2023 0918   PROT 7.2 05/12/2014 0936   ALBUMIN 3.8 08/27/2023 1450   ALBUMIN 3.4 05/12/2014 0936   AST 20 07/24/2023 0918   AST 30 05/12/2014 0936   ALT 17 07/24/2023 0918   ALT 26 05/12/2014 0936   ALKPHOS 104 07/24/2023 0918   ALKPHOS 144 (H) 05/12/2014 0936   BILITOT 0.6 07/24/2023 0918   BILITOT 0.5 05/12/2014 0936   GFR 55.83 (L) 08/27/2023 1450   GFRNONAA >60 07/24/2023 0918   GFRNONAA 55 (L) 05/12/2014 0936     No results found.     Assessment &  Plan:   1. Acute deep vein thrombosis (DVT) of femoral vein of left lower extremity (HCC) Currently no evidence of DVT in the left lower extremity  2. Leg swelling I believe that the leg swelling is related to some postphlebitic symptoms in addition to some likely underlying neuropathy due to longstanding swelling.  I have discussed the patient about the use of compression socks and it would be helpful if she wears them consistently a basis.  She should also elevate her lower extremities is much as possible the patient has a lymphedema pump but does not several days for the next couple of weeks she can then progressed to 45 minutes to an hour  3. Spinal stenosis, lumbar region, with neurogenic claudication Based on the patient's description of discomfort in the legs I do believe that this is playing a role burning sensation that she has during the evening.   Current Outpatient Medications on File Prior to Visit  Medication Sig Dispense Refill   acetaminophen (TYLENOL) 500 MG tablet Take 1,000 mg by mouth every 8 (eight) hours as needed.      albuterol (PROAIR HFA) 108 (90 Base) MCG/ACT inhaler INHALE 2 PUFFS INTO THE LUNGS EVERY 6 (SIX) HOURS AS NEEDED FOR WHEEZING. 8.5 each 1   ALPRAZolam (XANAX) 0.25 MG tablet Take 1 tablet (0.25 mg total) by mouth 2 (two) times daily as needed for anxiety. 20 tablet 0  apixaban (ELIQUIS) 5 MG TABS tablet Take 1 tablet (5 mg total) by mouth 2 (two) times daily. 180 tablet 3   Biotin (BIOTIN 5000) 5 MG CAPS Take by mouth.     cetirizine (ZYRTEC) 10 MG tablet Take 10 mg by mouth at bedtime.      Cholecalciferol (VITAMIN D3) 25 MCG (1000 UT) CAPS      docusate sodium (COLACE) 100 MG capsule Take 100 mg by mouth daily as needed.      etodolac (LODINE) 500 MG tablet Take 1 tablet (500 mg total) by mouth 2 (two) times daily as needed. 180 tablet 0   fluticasone (FLONASE) 50 MCG/ACT nasal spray Place 2 sprays into both nostrils daily. 48 g 3   furosemide (LASIX)  40 MG tablet Take 1 tablet (40 mg total) by mouth daily as needed. (Patient taking differently: Take 40 mg by mouth daily.) 30 tablet 3   LORazepam (ATIVAN) 0.5 MG tablet TAKE ONE-HALF TO 1 TABLET BY MOUTH EVERYDAY AT BEDTIME 30 tablet 0   Menthol-Methyl Salicylate (SALONPAS PAIN RELIEF PATCH EX) Apply topically. prn     niacin (VITAMIN B3) 500 MG tablet Take 500 mg by mouth at bedtime.     pantoprazole (PROTONIX) 40 MG tablet Take 1 tablet (40 mg total) by mouth 2 (two) times daily. 180 tablet 3   polyethylene glycol (MIRALAX / GLYCOLAX) packet Take 17 g by mouth daily as needed for moderate constipation.      tiZANidine (ZANAFLEX) 2 MG tablet TAKE ONE TABLET BY MOUTH EVERYDAY AT BEDTIME AS NEEDED FOR MUSCLE SPASMS 90 tablet 3   Vibegron (GEMTESA) 75 MG TABS Take 1 tablet (75 mg total) by mouth daily. 30 tablet 11   Glucos-Chondroit-Hyaluron-MSM (GLUCOSAMINE CHONDROITIN JOINT) TABS Take 2 tablets by mouth daily. (Patient not taking: Reported on 09/01/2023)     No current facility-administered medications on file prior to visit.    There are no Patient Instructions on file for this visit. No follow-ups on file.   Georgiana Spinner, NP

## 2023-09-14 ENCOUNTER — Ambulatory Visit: Payer: PPO | Admitting: Urology

## 2023-09-14 ENCOUNTER — Encounter: Payer: Self-pay | Admitting: Urology

## 2023-09-14 VITALS — BP 165/98 | HR 73 | Ht 65.0 in | Wt 242.0 lb

## 2023-09-14 DIAGNOSIS — R32 Unspecified urinary incontinence: Secondary | ICD-10-CM

## 2023-09-14 DIAGNOSIS — N3281 Overactive bladder: Secondary | ICD-10-CM

## 2023-09-14 MED ORDER — OXYBUTYNIN CHLORIDE ER 10 MG PO TB24: 10.0000 mg | ORAL_TABLET | Freq: Every day | ORAL | 11 refills | Status: DC

## 2023-09-14 NOTE — Progress Notes (Signed)
09/14/2023 11:13 AM   Alice Donaldson [birth year] [id number]  Referring provider: Karie Schwalbe, MD [address] [place],  [place] [address]  Chief Complaint  Patient presents with   Follow-up   Urinary Incontinence    HPI: I was consulted to assess the patient's frequency both day and night.  During the day she has had rare episodes of urge incontinence not wearing a pad.  She denies stress incontinence.  No bedwetting.  Once she had foot on the floor syndrome   She can void every 90 minutes to 2 hours at night and has significant leg edema.  She voids every hour and cannot hold it for 2 hours.  In January when she was quite sick with pneumonia and COVID she lost sensation to urinate and was leaking a lot   She has a weak flow that is prolonged with a lot of stopping and starting but she feels empty   She has had a hysterectomy   On pelvic examination patient had limited mobility.  Narrow introitus.  Well supported bladder neck.  No stress incontinence    Patient primarily has an overactive bladder with infrequent urge incontinence.  she has significant leg edema and likely has a nocturnal diuresis and this was discussed.  She has had blood clots.  She uses a cane.  Patient started on Gemtesa 75 mg samples and prescription and return in 6 weeks  I will check her for blood in the urine next time but otherwise I will hold off on cystoscopy for now based upon immobility  Today Urinalysis July 13, 2023 no blood in urine Rheumatic reduction in urgency and frequency but it is $100.  Medically not infected    PMH: Past Medical History:  Diagnosis Date   Allergic rhinitis, cause unspecified    Allergy    Anemia    Complication of anesthesia    Diverticulosis    DVT (deep venous thrombosis) (HCC)    x2.  Last in 9/21   Gastric ulcer    in past   GERD (gastroesophageal reflux disease)    Hemorrhoids, internal    Hyperlipidemia    Motion sickness    ocean ships    Osteoarthrosis involving, or with mention of more than one site, but not specified as generalized, multiple sites    PONV (postoperative nausea and vomiting)    Ulcer (traumatic) of oral mucosa    Unspecified venous (peripheral) insufficiency    Vertigo    No episodes over 10 years    Surgical History: Past Surgical History:  Procedure Laterality Date   ABDOMINAL HYSTERECTOMY  1996   BREAST BIOPSY  2003   CATARACT EXTRACTION W/PHACO Right 03/25/2022   Procedure: CATARACT EXTRACTION PHACO AND INTRAOCULAR LENS PLACEMENT (IOC) RIGHT  VIVITY LENS 22.90 01:57.1;  Surgeon: Galen Manila, MD;  Location: [facility];  Service: Ophthalmology;  Laterality: Right;  Latex   CHOLECYSTECTOMY  2000   COLONOSCOPY WITH PROPOFOL N/A 10/02/2015   Procedure: COLONOSCOPY WITH PROPOFOL;  Surgeon: Christena Deem, MD;  Location: [facility] ENDOSCOPY;  Service: Endoscopy;  Laterality: N/A;   COLONOSCOPY WITH PROPOFOL N/A 06/17/2018   Procedure: COLONOSCOPY WITH PROPOFOL;  Surgeon: Toledo, Boykin Nearing, MD;  Location: [facility] ENDOSCOPY;  Service: Gastroenterology;  Laterality: N/A;   DEXA  5/14   Normal   ESOPHAGOGASTRODUODENOSCOPY (EGD) WITH PROPOFOL N/A 06/17/2018   Procedure: ESOPHAGOGASTRODUODENOSCOPY (EGD) WITH PROPOFOL;  Surgeon: Toledo, Boykin Nearing, MD;  Location: [facility] ENDOSCOPY;  Service: Gastroenterology;  Laterality: N/A;   KNEE ARTHROSCOPY     right in 2007, left in 2011   PERIPHERAL VASCULAR THROMBECTOMY Left 08/27/2020   Procedure: PERIPHERAL VASCULAR THROMBECTOMY;  Surgeon: Annice Needy, MD;  Location: [facility] INVASIVE CV LAB;  Service: Cardiovascular;  Laterality: Left;   TONSILLECTOMY AND ADENOIDECTOMY  childhood    Home Medications:  Allergies as of 09/14/2023       Reactions   Prevpac [amoxicill-clarithro-lansopraz] Other (See Comments)   Throat closed up   Latex Rash   Had issues when wearing powdered gloves all day long while working.   Duloxetine Other (See Comments)   Nausea, diarrhea and  jittery   Tramadol    GI symptoms        Medication List        Accurate as of September 14, 2023 11:13 AM. If you have any questions, ask your nurse or doctor.          acetaminophen 500 MG tablet Commonly known as: TYLENOL Take 1,000 mg by mouth every 8 (eight) hours as needed.   albuterol 108 (90 Base) MCG/ACT inhaler Commonly known as: ProAir HFA INHALE 2 PUFFS INTO THE LUNGS EVERY 6 (SIX) HOURS AS NEEDED FOR WHEEZING.   ALPRAZolam 0.25 MG tablet Commonly known as: XANAX Take 1 tablet (0.25 mg total) by mouth 2 (two) times daily as needed for anxiety.   apixaban 5 MG Tabs tablet Commonly known as: Eliquis Take 1 tablet (5 mg total) by mouth 2 (two) times daily.   Biotin 5000 5 MG Caps Generic drug: Biotin Take by mouth.   cetirizine 10 MG tablet Commonly known as: ZYRTEC Take 10 mg by mouth at bedtime.   docusate sodium 100 MG capsule Commonly known as: COLACE Take 100 mg by mouth daily as needed.   etodolac 500 MG tablet Commonly known as: LODINE Take 1 tablet (500 mg total) by mouth 2 (two) times daily as needed.   fluticasone 50 MCG/ACT nasal spray Commonly known as: FLONASE Place 2 sprays into both nostrils daily.   furosemide 40 MG tablet Commonly known as: LASIX Take 1 tablet (40 mg total) by mouth daily as needed. What changed: when to take this   Gemtesa 75 MG Tabs Generic drug: Vibegron Take 1 tablet (75 mg total) by mouth daily.   Glucosamine Chondroitin Joint Tabs Take 2 tablets by mouth daily.   LORazepam 0.5 MG tablet Commonly known as: ATIVAN TAKE ONE-HALF TO 1 TABLET BY MOUTH EVERYDAY AT BEDTIME   niacin 500 MG tablet Commonly known as: VITAMIN B3 Take 500 mg by mouth at bedtime.   pantoprazole 40 MG tablet Commonly known as: PROTONIX Take 1 tablet (40 mg total) by mouth 2 (two) times daily.   polyethylene glycol 17 g packet Commonly known as: MIRALAX / GLYCOLAX Take 17 g by mouth daily as needed for moderate  constipation.   SALONPAS PAIN RELIEF PATCH EX Apply topically. prn   tiZANidine 2 MG tablet Commonly known as: ZANAFLEX TAKE ONE TABLET BY MOUTH EVERYDAY AT BEDTIME AS NEEDED FOR MUSCLE SPASMS   Vitamin D3 25 MCG (1000 UT) Caps        Allergies:  Allergies  Allergen Reactions   Prevpac [Amoxicill-Clarithro-Lansopraz] Other (See Comments)    Throat closed up   Latex Rash    Had issues when wearing powdered gloves all day long while working.   Duloxetine Other (See Comments)    Nausea, diarrhea and jittery   Tramadol     GI symptoms  Family History: Family History  Problem Relation Age of Onset   Heart disease Mother    Heart disease Father    Diabetes Son 5       Type 1   Hypertension Son    Stroke Brother    Diabetes Other 2       type 1   Hyperlipidemia Neg Hx    Depression Neg Hx     Social History:  reports that she quit smoking about 34 years ago. Her smoking use included cigarettes. She has never been exposed to tobacco smoke. She has never used smokeless tobacco. She reports current alcohol use of about 2.0 standard drinks of alcohol per week. She reports that she does not use drugs.  ROS:                                        Physical Exam: LMP  (LMP Unknown)   Constitutional:  Alert and oriented, No acute distress. HEENT: [place] AT, moist mucus membranes.  Trachea midline, no masses.   Laboratory Data: Lab Results  Component Value Date   WBC 5.6 08/21/2023   HGB 12.5 08/21/2023   HCT 40.8 08/21/2023   MCV 83.8 08/21/2023   PLT 191 08/21/2023    Lab Results  Component Value Date   CREATININE 0.97 08/27/2023    No results found for: "PSA"  No results found for: "TESTOSTERONE"  Lab Results  Component Value Date   HGBA1C 6.1 04/18/2015    Urinalysis    Component Value Date/Time   COLORURINE YELLOW (A) 06/16/2018 1356   APPEARANCEUR Hazy (A) 07/13/2023 1425   LABSPEC 1.014 06/16/2018 1356   PHURINE 7.0  06/16/2018 1356   GLUCOSEU Negative 07/13/2023 1425   HGBUR NEGATIVE 06/16/2018 1356   BILIRUBINUR Negative 07/13/2023 1425   KETONESUR NEGATIVE 06/16/2018 1356   PROTEINUR 2+ (A) 07/13/2023 1425   PROTEINUR NEGATIVE 06/16/2018 1356   NITRITE Negative 07/13/2023 1425   NITRITE NEGATIVE 06/16/2018 1356   LEUKOCYTESUR Negative 07/13/2023 1425    Pertinent Imaging:   Assessment & Plan: I gave her oxybutynin ER 10 mg 30 x 11.  I wrote down Myrbetriq.  If oxybutynin does not reach her treatment goal she will call her insurance and find out how much Myrbetriq is.  1. Urinary incontinence, unspecified type  - Urinalysis, Complete   No follow-ups on file.  Martina Sinner, MD  [facility] [address], [address] [place], [place] [address] [phone]

## 2023-09-18 DIAGNOSIS — U071 COVID-19: Secondary | ICD-10-CM | POA: Diagnosis not present

## 2023-09-18 DIAGNOSIS — J9610 Chronic respiratory failure, unspecified whether with hypoxia or hypercapnia: Secondary | ICD-10-CM | POA: Diagnosis not present

## 2023-09-18 DIAGNOSIS — R0902 Hypoxemia: Secondary | ICD-10-CM | POA: Diagnosis not present

## 2023-10-05 ENCOUNTER — Encounter: Payer: Self-pay | Admitting: Internal Medicine

## 2023-10-19 DIAGNOSIS — U071 COVID-19: Secondary | ICD-10-CM | POA: Diagnosis not present

## 2023-10-19 DIAGNOSIS — R0902 Hypoxemia: Secondary | ICD-10-CM | POA: Diagnosis not present

## 2023-10-19 DIAGNOSIS — J9610 Chronic respiratory failure, unspecified whether with hypoxia or hypercapnia: Secondary | ICD-10-CM | POA: Diagnosis not present

## 2023-10-26 ENCOUNTER — Ambulatory Visit: Payer: PPO | Admitting: Urology

## 2023-11-16 ENCOUNTER — Other Ambulatory Visit: Payer: Self-pay | Admitting: Internal Medicine

## 2023-11-17 ENCOUNTER — Encounter: Payer: Self-pay | Admitting: Internal Medicine

## 2023-11-18 DIAGNOSIS — J9610 Chronic respiratory failure, unspecified whether with hypoxia or hypercapnia: Secondary | ICD-10-CM | POA: Diagnosis not present

## 2023-11-18 DIAGNOSIS — U071 COVID-19: Secondary | ICD-10-CM | POA: Diagnosis not present

## 2023-11-18 DIAGNOSIS — R0902 Hypoxemia: Secondary | ICD-10-CM | POA: Diagnosis not present

## 2023-11-23 ENCOUNTER — Inpatient Hospital Stay: Payer: PPO | Attending: Oncology

## 2023-11-23 ENCOUNTER — Encounter: Payer: Self-pay | Admitting: Oncology

## 2023-11-23 ENCOUNTER — Inpatient Hospital Stay: Payer: PPO

## 2023-11-23 ENCOUNTER — Inpatient Hospital Stay (HOSPITAL_BASED_OUTPATIENT_CLINIC_OR_DEPARTMENT_OTHER): Payer: PPO | Admitting: Oncology

## 2023-11-23 VITALS — BP 180/90 | HR 72 | Temp 97.2°F | Resp 18 | Ht 65.0 in

## 2023-11-23 DIAGNOSIS — D500 Iron deficiency anemia secondary to blood loss (chronic): Secondary | ICD-10-CM

## 2023-11-23 DIAGNOSIS — Z79899 Other long term (current) drug therapy: Secondary | ICD-10-CM | POA: Diagnosis not present

## 2023-11-23 DIAGNOSIS — Z862 Personal history of diseases of the blood and blood-forming organs and certain disorders involving the immune mechanism: Secondary | ICD-10-CM | POA: Insufficient documentation

## 2023-11-23 DIAGNOSIS — D509 Iron deficiency anemia, unspecified: Secondary | ICD-10-CM | POA: Diagnosis not present

## 2023-11-23 LAB — IRON AND TIBC
Iron: 117 ug/dL (ref 28–170)
Saturation Ratios: 33 % — ABNORMAL HIGH (ref 10.4–31.8)
TIBC: 356 ug/dL (ref 250–450)
UIBC: 239 ug/dL

## 2023-11-23 LAB — CBC WITH DIFFERENTIAL/PLATELET
Abs Immature Granulocytes: 0.01 10*3/uL (ref 0.00–0.07)
Basophils Absolute: 0 10*3/uL (ref 0.0–0.1)
Basophils Relative: 1 %
Eosinophils Absolute: 0.3 10*3/uL (ref 0.0–0.5)
Eosinophils Relative: 7 %
HCT: 42.8 % (ref 36.0–46.0)
Hemoglobin: 14 g/dL (ref 12.0–15.0)
Immature Granulocytes: 0 %
Lymphocytes Relative: 36 %
Lymphs Abs: 1.7 10*3/uL (ref 0.7–4.0)
MCH: 30.6 pg (ref 26.0–34.0)
MCHC: 32.7 g/dL (ref 30.0–36.0)
MCV: 93.7 fL (ref 80.0–100.0)
Monocytes Absolute: 0.6 10*3/uL (ref 0.1–1.0)
Monocytes Relative: 12 %
Neutro Abs: 2.1 10*3/uL (ref 1.7–7.7)
Neutrophils Relative %: 44 %
Platelets: 199 10*3/uL (ref 150–400)
RBC: 4.57 MIL/uL (ref 3.87–5.11)
RDW: 12.7 % (ref 11.5–15.5)
WBC: 4.7 10*3/uL (ref 4.0–10.5)
nRBC: 0 % (ref 0.0–0.2)

## 2023-11-23 LAB — COMPREHENSIVE METABOLIC PANEL
ALT: 16 U/L (ref 0–44)
AST: 20 U/L (ref 15–41)
Albumin: 3.5 g/dL (ref 3.5–5.0)
Alkaline Phosphatase: 103 U/L (ref 38–126)
Anion gap: 7 (ref 5–15)
BUN: 16 mg/dL (ref 8–23)
CO2: 25 mmol/L (ref 22–32)
Calcium: 9.5 mg/dL (ref 8.9–10.3)
Chloride: 107 mmol/L (ref 98–111)
Creatinine, Ser: 0.83 mg/dL (ref 0.44–1.00)
GFR, Estimated: >60 mL/min (ref >60)
Glucose, Bld: 108 mg/dL — ABNORMAL HIGH (ref 70–99)
Potassium: 3.8 mmol/L (ref 3.5–5.1)
Sodium: 139 mmol/L (ref 135–145)
Total Bilirubin: 1 mg/dL (ref <1.2)
Total Protein: 6.8 g/dL (ref 6.5–8.1)

## 2023-11-23 LAB — FERRITIN: Ferritin: 23 ng/mL (ref 11–307)

## 2023-11-23 NOTE — Progress Notes (Signed)
[facility]  [phone] [phone]  ID: Alice Donaldson OB: Apr 19, 1946  MR#: [id number]  XTG#:[id number]  Patient Care Team: Karie Schwalbe, MD as PCP - General (Pediatrics) Jeralyn Ruths, MD as Consulting Physician (Hematology and Oncology) Kathyrn Sheriff, [facility] (Inactive) as Pharmacist (Pharmacist)   CHIEF COMPLAINT: Iron deficiency anemia.  INTERVAL HISTORY: Patient returns to clinic today for repeat laboratory work and further evaluation.  She continues to have chronic weakness and fatigue, but otherwise feels well.  She has no neurologic complaints.  She denies any recent fevers or illnesses. She has a good appetite and denies weight loss.  She denies any chest pain, shortness of breath, cough, or hemoptysis.  She denies any nausea, vomiting, constipation, or diarrhea. She denies any melena or hematochezia. She has no urinary complaints.  Patient offers no further specific complaints today.  REVIEW OF SYSTEMS:   Review of Systems  Constitutional:  Positive for malaise/fatigue. Negative for fever and weight loss.  Respiratory: Negative.  Negative for cough, hemoptysis and shortness of breath.   Cardiovascular: Negative.  Negative for chest pain and leg swelling.  Gastrointestinal: Negative.  Negative for abdominal pain, blood in stool and melena.  Genitourinary: Negative.  Negative for hematuria.  Musculoskeletal: Negative.  Negative for back pain.  Skin: Negative.  Negative for rash.  Neurological:  Positive for weakness. Negative for sensory change, focal weakness and headaches.  Psychiatric/Behavioral: Negative.  The patient is not nervous/anxious.     As per HPI. Otherwise, a complete review of systems is negative.  PAST MEDICAL HISTORY: Past Medical History:  Diagnosis Date   Allergic rhinitis, cause unspecified    Allergy    Anemia    Complication of anesthesia    Diverticulosis    DVT (deep venous thrombosis) (HCC)     x2.  Last in 9/21   Gastric ulcer    in past   GERD (gastroesophageal reflux disease)    Hemorrhoids, internal    Hyperlipidemia    Motion sickness    ocean ships   Osteoarthrosis involving, or with mention of more than one site, but not specified as generalized, multiple sites    PONV (postoperative nausea and vomiting)    Ulcer (traumatic) of oral mucosa    Unspecified venous (peripheral) insufficiency    Vertigo    No episodes over 10 years    PAST SURGICAL HISTORY: Past Surgical History:  Procedure Laterality Date   ABDOMINAL HYSTERECTOMY  1996   BREAST BIOPSY  2003   CATARACT EXTRACTION W/PHACO Right 03/25/2022   Procedure: CATARACT EXTRACTION PHACO AND INTRAOCULAR LENS PLACEMENT (IOC) RIGHT  VIVITY LENS 22.90 01:57.1;  Surgeon: Galen Manila, MD;  Location: [facility];  Service: Ophthalmology;  Laterality: Right;  Latex   CHOLECYSTECTOMY  2000   COLONOSCOPY WITH PROPOFOL N/A 10/02/2015   Procedure: COLONOSCOPY WITH PROPOFOL;  Surgeon: Christena Deem, MD;  Location: [facility] ENDOSCOPY;  Service: Endoscopy;  Laterality: N/A;   COLONOSCOPY WITH PROPOFOL N/A 06/17/2018   Procedure: COLONOSCOPY WITH PROPOFOL;  Surgeon: Toledo, Boykin Nearing, MD;  Location: [facility] ENDOSCOPY;  Service: Gastroenterology;  Laterality: N/A;   DEXA  5/14   Normal   ESOPHAGOGASTRODUODENOSCOPY (EGD) WITH PROPOFOL N/A 06/17/2018   Procedure: ESOPHAGOGASTRODUODENOSCOPY (EGD) WITH PROPOFOL;  Surgeon: Toledo, Boykin Nearing, MD;  Location: [facility] ENDOSCOPY;  Service: Gastroenterology;  Laterality: N/A;   KNEE ARTHROSCOPY     right in 2007, left in 2011   PERIPHERAL VASCULAR THROMBECTOMY Left 08/27/2020   Procedure:  PERIPHERAL VASCULAR THROMBECTOMY;  Surgeon: Annice Needy, MD;  Location: [facility] INVASIVE CV LAB;  Service: Cardiovascular;  Laterality: Left;   TONSILLECTOMY AND ADENOIDECTOMY  childhood    FAMILY HISTORY: Family History  Problem Relation Age of Onset   Heart disease Mother    Heart disease  Father    Diabetes Son 5       Type 1   Hypertension Son    Stroke Brother    Diabetes Other 2       type 1   Hyperlipidemia Neg Hx    Depression Neg Hx     ADVANCED DIRECTIVES (Y/N):  N  HEALTH MAINTENANCE: Social History   Tobacco Use   Smoking status: Former    Current packs/day: 0.00    Types: Cigarettes    Quit date: 1990    Years since quitting: 34.9    Passive exposure: Never   Smokeless tobacco: Never  Vaping Use   Vaping status: Never Used  Substance Use Topics   Alcohol use: Yes    Alcohol/week: 2.0 standard drinks of alcohol    Types: 2 Glasses of wine per week    Comment: glass of wine 2x/wk   Drug use: No     Colonoscopy:  PAP:  Bone density:  Lipid panel:  Allergies  Allergen Reactions   Prevpac [Amoxicill-Clarithro-Lansopraz] Other (See Comments)    Throat closed up   Latex Rash    Had issues when wearing powdered gloves all day long while working.   Duloxetine Other (See Comments)    Nausea, diarrhea and jittery   Tramadol     GI symptoms    Current Outpatient Medications  Medication Sig Dispense Refill   acetaminophen (TYLENOL) 500 MG tablet Take 1,000 mg by mouth every 8 (eight) hours as needed.      apixaban (ELIQUIS) 5 MG TABS tablet Take 1 tablet (5 mg total) by mouth 2 (two) times daily. 180 tablet 3   Biotin (BIOTIN 5000) 5 MG CAPS Take by mouth.     cetirizine (ZYRTEC) 10 MG tablet Take 10 mg by mouth at bedtime.      Cholecalciferol (VITAMIN D3) 25 MCG (1000 UT) CAPS      fluticasone (FLONASE) 50 MCG/ACT nasal spray Place 2 sprays into both nostrils daily. 48 g 3   Glucos-Chondroit-Hyaluron-MSM (GLUCOSAMINE CHONDROITIN JOINT) TABS Take 2 tablets by mouth daily.     pantoprazole (PROTONIX) 40 MG tablet Take 1 tablet (40 mg total) by mouth 2 (two) times daily. 180 tablet 3   polyethylene glycol (MIRALAX / GLYCOLAX) packet Take 17 g by mouth daily as needed for moderate constipation.      tiZANidine (ZANAFLEX) 2 MG tablet TAKE ONE  TABLET BY MOUTH EVERYDAY AT BEDTIME AS NEEDED FOR MUSCLE SPASMS 90 tablet 3   albuterol (PROAIR HFA) 108 (90 Base) MCG/ACT inhaler INHALE 2 PUFFS INTO THE LUNGS EVERY 6 (SIX) HOURS AS NEEDED FOR WHEEZING. (Patient not taking: Reported on 11/23/2023) 8.5 each 1   ALPRAZolam (XANAX) 0.25 MG tablet Take 1 tablet (0.25 mg total) by mouth 2 (two) times daily as needed for anxiety. (Patient not taking: Reported on 11/23/2023) 20 tablet 0   docusate sodium (COLACE) 100 MG capsule Take 100 mg by mouth daily as needed.  (Patient not taking: Reported on 11/23/2023)     etodolac (LODINE) 500 MG tablet TAKE 1 TABLET BY MOUTH 2 TIMES DAILY AS NEEDED. (Patient not taking: Reported on 11/23/2023) 180 tablet 0   furosemide (LASIX) 40  MG tablet Take 1 tablet (40 mg total) by mouth daily as needed. (Patient not taking: Reported on 11/23/2023) 30 tablet 3   LORazepam (ATIVAN) 0.5 MG tablet TAKE ONE-HALF TO 1 TABLET BY MOUTH EVERYDAY AT BEDTIME (Patient not taking: Reported on 11/23/2023) 30 tablet 0   Menthol-Methyl Salicylate (SALONPAS PAIN RELIEF PATCH EX) Apply topically. prn (Patient not taking: Reported on 11/23/2023)     niacin (VITAMIN B3) 500 MG tablet Take 500 mg by mouth at bedtime. (Patient not taking: Reported on 11/23/2023)     oxybutynin (DITROPAN-XL) 10 MG 24 hr tablet Take 1 tablet (10 mg total) by mouth at bedtime. (Patient not taking: Reported on 11/23/2023) 30 tablet 11   No current facility-administered medications for this visit.    OBJECTIVE: Vitals:   11/23/23 1301  BP: (!) 180/90  Pulse: 72  Resp: 18  Temp: (!) 97.2 F (36.2 C)  SpO2: 98%      Body mass index is 40.27 kg/m.    ECOG FS:1 - Symptomatic but completely ambulatory  General: Well-developed, well-nourished, no acute distress. Eyes: Pink conjunctiva, anicteric sclera. HEENT: Normocephalic, moist mucous membranes. Lungs: No audible wheezing or coughing. Heart: Regular rate and rhythm. Abdomen: Soft, nontender, no obvious  distention. Musculoskeletal: No edema, cyanosis, or clubbing. Neuro: Alert, answering all questions appropriately. Cranial nerves grossly intact. Skin: No rashes or petechiae noted. Psych: Normal affect.  LAB RESULTS:  Lab Results  Component Value Date   NA 139 11/23/2023   K 3.8 11/23/2023   CL 107 11/23/2023   CO2 25 11/23/2023   GLUCOSE 108 (H) 11/23/2023   BUN 16 11/23/2023   CREATININE 0.83 11/23/2023   CALCIUM 9.5 11/23/2023   PROT 6.8 11/23/2023   ALBUMIN 3.5 11/23/2023   AST 20 11/23/2023   ALT 16 11/23/2023   ALKPHOS 103 11/23/2023   BILITOT 1.0 11/23/2023   GFRNONAA >60 11/23/2023   GFRAA >60 08/27/2020    Lab Results  Component Value Date   WBC 4.7 11/23/2023   NEUTROABS 2.1 11/23/2023   HGB 14.0 11/23/2023   HCT 42.8 11/23/2023   MCV 93.7 11/23/2023   PLT 199 11/23/2023   Lab Results  Component Value Date   IRON 117 11/23/2023   TIBC 356 11/23/2023   IRONPCTSAT 33 (H) 11/23/2023   Lab Results  Component Value Date   FERRITIN 23 11/23/2023     STUDIES: No results found.  ASSESSMENT: Iron deficiency anemia.  PLAN:    Iron deficiency anemia: Patient's hemoglobin dropped to 6.4 back in August 2024 requiring multiple units of packed red blood cells.  She also received multiple doses of Feraheme most recently on August 21, 2023.  She has a colonoscopy scheduled for March 2025.  Her hemoglobin and iron stores from today are now within normal limits.  She does not require additional IV Feraheme.  Return to clinic in 3 months with repeat laboratory work, further evaluation, and additional IV iron if necessary.   B12 deficiency: Resolved.  Patient last received B12 injection on February 28, 2021.     I spent a total of 20 minutes reviewing chart data, face-to-face evaluation with the patient, counseling and coordination of care as detailed above.   Patient expressed understanding and was in agreement with this plan. She also understands that She can  call clinic at any time with any questions, concerns, or complaints.    Jeralyn Ruths, MD   11/23/2023 1:53 PM

## 2024-02-15 ENCOUNTER — Encounter: Payer: Self-pay | Admitting: Internal Medicine

## 2024-02-15 ENCOUNTER — Other Ambulatory Visit: Payer: Self-pay | Admitting: Internal Medicine

## 2024-02-15 ENCOUNTER — Telehealth (INDEPENDENT_AMBULATORY_CARE_PROVIDER_SITE_OTHER): Admitting: Internal Medicine

## 2024-02-15 DIAGNOSIS — J069 Acute upper respiratory infection, unspecified: Secondary | ICD-10-CM | POA: Diagnosis not present

## 2024-02-15 MED ORDER — PREDNISONE 20 MG PO TABS
40.0000 mg | ORAL_TABLET | Freq: Every day | ORAL | 0 refills | Status: DC
Start: 2024-02-15 — End: 2024-04-21

## 2024-02-15 NOTE — Progress Notes (Signed)
 Subjective:    Patient ID: Alice Donaldson, female    DOB: 09-24-1946, 78 y.o.   MRN: [id number]  HPI Video virtual visit due to respiratory illness Identification done Reviewed limitations and billing and she gave consent Participants---patient in her home and I am in my office  Started with sensation in throat -- evening of 2/28 Next day started with chest congestion into her throat Voice was off Worsened yesterday--and even worse now No fever but some chills No sweats or myalgia Frontal headache--not as bad now either (likely from tylenol) ?slight SOB--but better since on the nasal spray Got albuterol out but was empty---so getting refill from pharmacy Loud wheezing  Started robitussin and nasal spray--some help  COVID test was negative  Current Outpatient Medications on File Prior to Visit  Medication Sig Dispense Refill   acetaminophen (TYLENOL) 500 MG tablet Take 1,000 mg by mouth every 8 (eight) hours as needed.      albuterol (PROAIR HFA) 108 (90 Base) MCG/ACT inhaler INHALE 2 PUFFS INTO THE LUNGS EVERY 6 (SIX) HOURS AS NEEDED FOR WHEEZING. 8.5 each 1   ALPRAZolam (XANAX) 0.25 MG tablet Take 1 tablet (0.25 mg total) by mouth 2 (two) times daily as needed for anxiety. 20 tablet 0   apixaban (ELIQUIS) 5 MG TABS tablet Take 1 tablet (5 mg total) by mouth 2 (two) times daily. 180 tablet 3   Biotin (BIOTIN 5000) 5 MG CAPS Take by mouth.     cetirizine (ZYRTEC) 10 MG tablet Take 10 mg by mouth at bedtime.      Cholecalciferol (VITAMIN D3) 25 MCG (1000 UT) CAPS      etodolac (LODINE) 500 MG tablet TAKE 1 TABLET BY MOUTH 2 TIMES DAILY AS NEEDED. 180 tablet 0   fluticasone (FLONASE) 50 MCG/ACT nasal spray Place 2 sprays into both nostrils daily. 48 g 3   Glucos-Chondroit-Hyaluron-MSM (GLUCOSAMINE CHONDROITIN JOINT) TABS Take 2 tablets by mouth daily.     LORazepam (ATIVAN) 0.5 MG tablet TAKE ONE-HALF TO 1 TABLET BY MOUTH EVERYDAY AT BEDTIME 30 tablet 0   pantoprazole (PROTONIX)  40 MG tablet Take 1 tablet (40 mg total) by mouth 2 (two) times daily. 180 tablet 3   polyethylene glycol (MIRALAX / GLYCOLAX) packet Take 17 g by mouth daily as needed for moderate constipation.      tiZANidine (ZANAFLEX) 2 MG tablet TAKE ONE TABLET BY MOUTH EVERYDAY AT BEDTIME AS NEEDED FOR MUSCLE SPASMS 90 tablet 3   docusate sodium (COLACE) 100 MG capsule Take 100 mg by mouth daily as needed.  (Patient not taking: Reported on 11/23/2023)     Menthol-Methyl Salicylate (SALONPAS PAIN RELIEF PATCH EX) Apply topically. prn (Patient not taking: Reported on 11/23/2023)     No current facility-administered medications on file prior to visit.    Allergies  Allergen Reactions   Prevpac [Amoxicill-Clarithro-Lansopraz] Other (See Comments)    Throat closed up   Latex Rash    Had issues when wearing powdered gloves all day long while working.   Duloxetine Other (See Comments)    Nausea, diarrhea and jittery   Tramadol     GI symptoms    Past Medical History:  Diagnosis Date   Allergic rhinitis, cause unspecified    Allergy    Anemia    Complication of anesthesia    Diverticulosis    DVT (deep venous thrombosis) (HCC)    x2.  Last in 9/21   Gastric ulcer    in past   GERD (  gastroesophageal reflux disease)    Hemorrhoids, internal    Hyperlipidemia    Motion sickness    ocean ships   Osteoarthrosis involving, or with mention of more than one site, but not specified as generalized, multiple sites    PONV (postoperative nausea and vomiting)    Ulcer (traumatic) of oral mucosa    Unspecified venous (peripheral) insufficiency    Vertigo    No episodes over 10 years    Past Surgical History:  Procedure Laterality Date   ABDOMINAL HYSTERECTOMY  1996   BREAST BIOPSY  2003   CATARACT EXTRACTION W/PHACO Right 03/25/2022   Procedure: CATARACT EXTRACTION PHACO AND INTRAOCULAR LENS PLACEMENT (IOC) RIGHT  VIVITY LENS 22.90 01:57.1;  Surgeon: Galen Manila, MD;  Location: [facility];  Service: Ophthalmology;  Laterality: Right;  Latex   CHOLECYSTECTOMY  2000   COLONOSCOPY WITH PROPOFOL N/A 10/02/2015   Procedure: COLONOSCOPY WITH PROPOFOL;  Surgeon: Christena Deem, MD;  Location: [facility]COPY;  Service: Endoscopy;  Laterality: N/A;   COLONOSCOPY WITH PROPOFOL N/A 06/17/2018   Procedure: COLONOSCOPY WITH PROPOFOL;  Surgeon: Toledo, Boykin Nearing, MD;  Location: [facility] ENDOSCOPY;  Service: Gastroenterology;  Laterality: N/A;   DEXA  5/14   Normal   ESOPHAGOGASTRODUODENOSCOPY (EGD) WITH PROPOFOL N/A 06/17/2018   Procedure: ESOPHAGOGASTRODUODENOSCOPY (EGD) WITH PROPOFOL;  Surgeon: Toledo, Boykin Nearing, MD;  Location: [facility] ENDOSCOPY;  Service: Gastroenterology;  Laterality: N/A;   KNEE ARTHROSCOPY     right in 2007, left in 2011   PERIPHERAL VASCULAR THROMBECTOMY Left 08/27/2020   Procedure: PERIPHERAL VASCULAR THROMBECTOMY;  Surgeon: Annice Needy, MD;  Location: [facility] INVASIVE CV LAB;  Service: Cardiovascular;  Laterality: Left;   TONSILLECTOMY AND ADENOIDECTOMY  childhood    Family History  Problem Relation Age of Onset   Heart disease Mother    Heart disease Father    Diabetes Son 5       Type 1   Hypertension Son    Stroke Brother    Diabetes Other 2       type 1   Hyperlipidemia Neg Hx    Depression Neg Hx     Social History   Socioeconomic History   Marital status: Widowed    Spouse name: Not on file   Number of children: 1   Years of education: Not on file   Highest education level: Associate degree: academic program  Occupational History   Occupation: Armed forces operational officer    Comment: retired  Tobacco Use   Smoking status: Former    Current packs/day: 0.00    Types: Cigarettes    Quit date: 1990    Years since quitting: 35.1    Passive exposure: Never   Smokeless tobacco: Never  Vaping Use   Vaping status: Never Used  Substance and Sexual Activity   Alcohol use: Yes    Alcohol/week: 2.0 standard drinks of alcohol    Types: 2 Glasses of wine per  week    Comment: glass of wine 2x/wk   Drug use: No   Sexual activity: Not on file  Other Topics Concern   Not on file  Social History Narrative   Husband died 03/10/2024   Has living will   Asks for Son Azucena Kuba to make decisions   Would accept resuscitation--but no prolonged ventilation   Doesn't want tube feeds if cognitively unaware   Social Drivers of Health   Financial Resource Strain: Low Risk  (07/30/2023)   Overall Financial Resource Strain (CARDIA)  Difficulty of Paying Living Expenses: Not hard at all  Food Insecurity: No Food Insecurity (07/30/2023)   Hunger Vital Sign    Worried About Running Out of Food in the Last Year: Never true    Ran Out of Food in the Last Year: Never true  Transportation Needs: No Transportation Needs (07/30/2023)   PRAPARE - Administrator, Civil Service (Medical): No    Lack of Transportation (Non-Medical): No  Physical Activity: Unknown (07/30/2023)   Exercise Vital Sign    Days of Exercise per Week: 0 days    Minutes of Exercise per Session: Not on file  Stress: No Stress Concern Present (07/30/2023)   [employer] of Occupational Health - Occupational Stress Questionnaire    Feeling of Stress : Only a little  Social Connections: Socially Isolated (07/30/2023)   Social Connection and Isolation Panel [NHANES]    Frequency of Communication with Friends and Family: More than three times a week    Frequency of Social Gatherings with Friends and Family: Twice a week    Attends Religious Services: Never    Database administrator or Organizations: No    Attends Engineer, structural: Not on file    Marital Status: Widowed  Intimate Partner Violence: Not At Risk (12/07/2022)   Humiliation, Afraid, Rape, and Kick questionnaire    Fear of Current or Ex-Partner: No    Emotionally Abused: No    Physically Abused: No    Sexually Abused: No   Review of Systems No change in smell or taste No N/V Able to eat--appetite off some      Objective:   Physical Exam Constitutional:      Comments: No distress Slightly hoarse Occasional coarse cough  Pulmonary:     Effort: Pulmonary effort is normal. No respiratory distress.  Neurological:     Mental Status: She is alert.            Assessment & Plan:

## 2024-02-15 NOTE — Assessment & Plan Note (Signed)
 Seems to be non flu/non COVID infection Discussed supportive care--tylenol, nasal spray, cough med Will add prednisone 40mg  x 3, 20mg  x 3---and she is refilling the albuterol inhaler If she worsens later in the week, will add augmentin for secondary sinus infection

## 2024-02-15 NOTE — Telephone Encounter (Signed)
 Spoke to pt. She is unable to get to the office today. We will do a virtual visit at 145 today.

## 2024-02-17 ENCOUNTER — Other Ambulatory Visit: Payer: Self-pay | Admitting: Internal Medicine

## 2024-02-17 MED ORDER — AZITHROMYCIN 250 MG PO TABS: ORAL_TABLET | ORAL | 0 refills | Status: DC

## 2024-02-17 NOTE — Addendum Note (Signed)
 Addended by: Tillman Abide I on: 02/17/2024 10:06 AM   Modules accepted: Orders

## 2024-02-19 ENCOUNTER — Encounter: Payer: Self-pay | Admitting: Oncology

## 2024-02-19 ENCOUNTER — Other Ambulatory Visit: Payer: Self-pay

## 2024-02-19 DIAGNOSIS — D500 Iron deficiency anemia secondary to blood loss (chronic): Secondary | ICD-10-CM

## 2024-02-22 ENCOUNTER — Inpatient Hospital Stay: Payer: PPO | Attending: Oncology

## 2024-02-22 ENCOUNTER — Encounter: Payer: Self-pay | Admitting: Oncology

## 2024-02-22 DIAGNOSIS — D509 Iron deficiency anemia, unspecified: Secondary | ICD-10-CM | POA: Diagnosis not present

## 2024-02-22 DIAGNOSIS — Z862 Personal history of diseases of the blood and blood-forming organs and certain disorders involving the immune mechanism: Secondary | ICD-10-CM | POA: Insufficient documentation

## 2024-02-22 DIAGNOSIS — D500 Iron deficiency anemia secondary to blood loss (chronic): Secondary | ICD-10-CM

## 2024-02-22 LAB — CBC WITH DIFFERENTIAL (CANCER CENTER ONLY)
Abs Immature Granulocytes: 0.07 10*3/uL (ref 0.00–0.07)
Basophils Absolute: 0.1 10*3/uL (ref 0.0–0.1)
Basophils Relative: 1 %
Eosinophils Absolute: 0.3 10*3/uL (ref 0.0–0.5)
Eosinophils Relative: 4 %
HCT: 46.6 % — ABNORMAL HIGH (ref 36.0–46.0)
Hemoglobin: 15.3 g/dL — ABNORMAL HIGH (ref 12.0–15.0)
Immature Granulocytes: 1 %
Lymphocytes Relative: 31 %
Lymphs Abs: 2.5 10*3/uL (ref 0.7–4.0)
MCH: 30.4 pg (ref 26.0–34.0)
MCHC: 32.8 g/dL (ref 30.0–36.0)
MCV: 92.6 fL (ref 80.0–100.0)
Monocytes Absolute: 1 10*3/uL (ref 0.1–1.0)
Monocytes Relative: 13 %
Neutro Abs: 4.1 10*3/uL (ref 1.7–7.7)
Neutrophils Relative %: 50 %
Platelet Count: 240 10*3/uL (ref 150–400)
RBC: 5.03 MIL/uL (ref 3.87–5.11)
RDW: 12.6 % (ref 11.5–15.5)
WBC Count: 8.1 10*3/uL (ref 4.0–10.5)
nRBC: 0 % (ref 0.0–0.2)

## 2024-02-22 LAB — VITAMIN B12: Vitamin B-12: 427 pg/mL (ref 180–914)

## 2024-02-22 LAB — IRON AND TIBC
Iron: 127 ug/dL (ref 28–170)
Saturation Ratios: 33 % — ABNORMAL HIGH (ref 10.4–31.8)
TIBC: 384 ug/dL (ref 250–450)
UIBC: 257 ug/dL

## 2024-02-22 LAB — FERRITIN: Ferritin: 28 ng/mL (ref 11–307)

## 2024-02-23 ENCOUNTER — Inpatient Hospital Stay: Payer: PPO | Admitting: Oncology

## 2024-02-23 ENCOUNTER — Inpatient Hospital Stay: Payer: PPO

## 2024-02-25 ENCOUNTER — Encounter: Payer: Self-pay | Admitting: Internal Medicine

## 2024-02-29 ENCOUNTER — Encounter: Payer: PPO | Admitting: Internal Medicine

## 2024-03-02 ENCOUNTER — Ambulatory Visit: Admitting: Oncology

## 2024-03-02 ENCOUNTER — Ambulatory Visit

## 2024-04-14 ENCOUNTER — Ambulatory Visit

## 2024-04-14 VITALS — BP 180/90 | Ht 65.0 in | Wt 250.0 lb

## 2024-04-14 DIAGNOSIS — Z2821 Immunization not carried out because of patient refusal: Secondary | ICD-10-CM

## 2024-04-14 DIAGNOSIS — Z0000 Encounter for general adult medical examination without abnormal findings: Secondary | ICD-10-CM

## 2024-04-14 DIAGNOSIS — Z5320 Procedure and treatment not carried out because of patient's decision for unspecified reasons: Secondary | ICD-10-CM

## 2024-04-14 DIAGNOSIS — Z2882 Immunization not carried out because of caregiver refusal: Secondary | ICD-10-CM

## 2024-04-14 NOTE — Patient Instructions (Signed)
 Ms. Demery , Thank you for taking time to come for your Medicare Wellness Visit. I appreciate your ongoing commitment to your health goals. Please review the following plan we discussed and let me know if I can assist you in the future.   Referrals/Orders/Follow-Ups/Clinician Recommendations: Follow up as scheduled   This is a list of the screening recommended for you and due dates:  Health Maintenance  Topic Date Due   Hepatitis C Screening  Never done   Zoster (Shingles) Vaccine (1 of 2) 10/16/1963   DTaP/Tdap/Td vaccine (2 - Td or Tdap) 08/25/2021   COVID-19 Vaccine (5 - 2024-25 season) 08/16/2023   Flu Shot  07/15/2024   Medicare Annual Wellness Visit  04/14/2025   Pneumonia Vaccine  Completed   DEXA scan (bone density measurement)  Completed   HPV Vaccine  Aged Out   Meningitis B Vaccine  Aged Out   Colon Cancer Screening  Discontinued    Advanced directives: (Copy Requested) Please bring a copy of your health care power of attorney and living will to the office to be added to your chart at your convenience. You can mail to [facility] [address] [place], [place] [address] or email to [email or web address]  Next Medicare Annual Wellness Visit scheduled for next year: no patient states she wants to wait until she finds a new provider.  Have you seen your provider in the last 6 months (3 months if uncontrolled diabetes)? Yes

## 2024-04-14 NOTE — Progress Notes (Signed)
 Because this visit was a virtual/telehealth visit,  certain criteria was not obtained, such a blood pressure, CBG if applicable, and timed get up and go. Any medications not marked as "taking" were not mentioned during the medication reconciliation part of the visit. Any vitals not documented were not able to be obtained due to this being a telehealth visit or patient was unable to self-report a recent blood pressure reading due to a lack of equipment at home via telehealth. Vitals that have been documented are verbally provided by the patient.  Subjective:   Alice Donaldson is a 78 y.o. who presents for a Medicare Wellness preventive visit.  Visit Complete: Virtual I connected with  [name] on 04/14/24 by a audio enabled telemedicine application and verified that I am speaking with the correct person using two identifiers.  Patient Location: Home  Provider Location: Home Office  I discussed the limitations of evaluation and management by telemedicine. The patient expressed understanding and agreed to proceed.  Vital Signs: Because this visit was a virtual/telehealth visit, some criteria may be missing or patient reported. Any vitals not documented were not able to be obtained and vitals that have been documented are patient reported.  VideoDeclined- This patient declined Librarian, academic. Therefore the visit was completed with audio only.  Persons Participating in Visit: Patient.  AWV Questionnaire: No: Patient Medicare AWV questionnaire was not completed prior to this visit.  Cardiac Risk Factors include: advanced age (>44men, >44 women);dyslipidemia;obesity (BMI >30kg/m2)     Objective:    Today's Vitals   04/14/24 1507  BP: (!) 180/90  Weight: 250 lb (113.4 kg)  Height: 5\' 5"  (1.651 m)   Body mass index is 41.6 kg/m.     04/14/2024    3:06 PM 11/23/2023    1:05 PM 08/21/2023    1:52 PM 07/24/2023    9:38 AM 12/07/2022    6:20 PM 12/05/2022     2:19 PM 03/25/2022    9:51 AM  Advanced Directives  Does Patient Have a Medical Advance Directive? Yes Yes No No  No Yes  Type of Estate agent of [place];Living will Healthcare Power of [place];Living will     Healthcare Power of [place];Living will  Does patient want to make changes to medical advance directive? No - Patient declined        Copy of Healthcare Power of Attorney in Chart? No - copy requested      No - copy requested  Would patient like information on creating a medical advance directive?   No - Patient declined No - Patient declined No - Patient declined      Current Medications (verified) Outpatient Encounter Medications as of 04/14/2024  Medication Sig   acetaminophen  (TYLENOL ) 500 MG tablet Take 1,000 mg by mouth every 8 (eight) hours as needed.    albuterol  (PROAIR  HFA) 108 (90 Base) MCG/ACT inhaler INHALE 2 PUFFS INTO THE LUNGS EVERY 6 (SIX) HOURS AS NEEDED FOR WHEEZING.   ALPRAZolam  (XANAX ) 0.25 MG tablet Take 1 tablet (0.25 mg total) by mouth 2 (two) times daily as needed for anxiety.   apixaban  (ELIQUIS ) 5 MG TABS tablet Take 1 tablet (5 mg total) by mouth 2 (two) times daily.   Biotin (BIOTIN 5000) 5 MG CAPS Take by mouth.   cetirizine (ZYRTEC) 10 MG tablet Take 10 mg by mouth at bedtime.    Cholecalciferol (VITAMIN D3) 25 MCG (1000 UT) CAPS    docusate sodium (COLACE) 100  MG capsule Take 100 mg by mouth daily as needed.   etodolac  (LODINE ) 500 MG tablet TAKE 1 TABLET BY MOUTH TWICE DAILY AS NEEDED   fluticasone  (FLONASE ) 50 MCG/ACT nasal spray PLACE 2 SPRAYS INTO BOTH NOSTRILS DAILY   Glucos-Chondroit-Hyaluron-MSM (GLUCOSAMINE CHONDROITIN JOINT) TABS Take 2 tablets by mouth daily.   LORazepam  (ATIVAN ) 0.5 MG tablet TAKE ONE-HALF TO 1 TABLET BY MOUTH EVERYDAY AT BEDTIME   Menthol-Methyl Salicylate (SALONPAS PAIN RELIEF PATCH EX) Apply topically. prn   pantoprazole  (PROTONIX ) 40 MG tablet Take 1 tablet (40 mg total) by mouth 2 (two) times  daily.   polyethylene glycol (MIRALAX  / GLYCOLAX ) packet Take 17 g by mouth daily as needed for moderate constipation.    tiZANidine  (ZANAFLEX ) 2 MG tablet TAKE ONE TABLET BY MOUTH EVERYDAY AT BEDTIME AS NEEDED FOR MUSCLE SPASMS   azithromycin  (ZITHROMAX  Z-PAK) 250 MG tablet Take 2 tablets (500 mg) on  Day 1,  followed by 1 tablet (250 mg) once daily on Days 2 through 5. (Patient not taking: Reported on 04/14/2024)   predniSONE  (DELTASONE ) 20 MG tablet Take 2 tablets (40 mg total) by mouth daily. For 3 days, then 1 tab daily for 3 days (Patient not taking: Reported on 04/14/2024)   No facility-administered encounter medications on file as of 04/14/2024.    Allergies (verified) Prevpac [amoxicill-clarithro-lansopraz], Latex, Duloxetine , and Tramadol   History: Past Medical History:  Diagnosis Date   Allergic rhinitis, cause unspecified    Allergy    Anemia    Complication of anesthesia    Diverticulosis    DVT (deep venous thrombosis) (HCC)    x2.  Last in 9/21   Gastric ulcer    in past   GERD (gastroesophageal reflux disease)    Hemorrhoids, internal    Hyperlipidemia    Motion sickness    ocean ships   Osteoarthrosis involving, or with mention of more than one site, but not specified as generalized, multiple sites    PONV (postoperative nausea and vomiting)    Ulcer (traumatic) of oral mucosa    Unspecified venous (peripheral) insufficiency    Vertigo    No episodes over 10 years   Past Surgical History:  Procedure Laterality Date   ABDOMINAL HYSTERECTOMY  1996   BREAST BIOPSY  2003   CATARACT EXTRACTION W/PHACO Right 03/25/2022   Procedure: CATARACT EXTRACTION PHACO AND INTRAOCULAR LENS PLACEMENT (IOC) RIGHT  VIVITY LENS 22.90 01:57.1;  Surgeon: Clair Crews, MD;  Location: [facility];  Service: Ophthalmology;  Laterality: Right;  Latex   CHOLECYSTECTOMY  2000   COLONOSCOPY WITH PROPOFOL  N/A 10/02/2015   Procedure: COLONOSCOPY WITH PROPOFOL ;  Surgeon: Deveron Fly, MD;  Location: [facility] ENDOSCOPY;  Service: Endoscopy;  Laterality: N/A;   COLONOSCOPY WITH PROPOFOL  N/A 06/17/2018   Procedure: COLONOSCOPY WITH PROPOFOL ;  Surgeon: Toledo, Alphonsus Jeans, MD;  Location: [facility] ENDOSCOPY;  Service: Gastroenterology;  Laterality: N/A;   DEXA  5/14   Normal   ESOPHAGOGASTRODUODENOSCOPY (EGD) WITH PROPOFOL  N/A 06/17/2018   Procedure: ESOPHAGOGASTRODUODENOSCOPY (EGD) WITH PROPOFOL ;  Surgeon: Toledo, Alphonsus Jeans, MD;  Location: [facility] ENDOSCOPY;  Service: Gastroenterology;  Laterality: N/A;   KNEE ARTHROSCOPY     right in 2007, left in 2011   PERIPHERAL VASCULAR THROMBECTOMY Left 08/27/2020   Procedure: PERIPHERAL VASCULAR THROMBECTOMY;  Surgeon: Celso College, MD;  Location: [facility] INVASIVE CV LAB;  Service: Cardiovascular;  Laterality: Left;   TONSILLECTOMY AND ADENOIDECTOMY  childhood   Family History  Problem Relation Age of Onset  Heart disease Mother    Heart disease Father    Diabetes Son 5       Type 1   Hypertension Son    Stroke Brother    Diabetes Other 2       type 1   Hyperlipidemia Neg Hx    Depression Neg Hx    Social History   Socioeconomic History   Marital status: Widowed    Spouse name: Not on file   Number of children: 1   Years of education: Not on file   Highest education level: Associate degree: academic program  Occupational History   Occupation: Armed forces operational officer    Comment: retired  Tobacco Use   Smoking status: Former    Current packs/day: 0.00    Types: Cigarettes    Quit date: 1990    Years since quitting: 35.3    Passive exposure: Never   Smokeless tobacco: Never  Vaping Use   Vaping status: Never Used  Substance and Sexual Activity   Alcohol use: Yes    Alcohol/week: 2.0 standard drinks of alcohol    Types: 2 Glasses of wine per week    Comment: glass of wine 2x/wk   Drug use: No   Sexual activity: Not on file  Other Topics Concern   Not on file  Social History Narrative   Husband died Mar 27, 2024   Has living will    Asks for Trixie Furnace to make decisions   Would accept resuscitation--but no prolonged ventilation   Doesn't want tube feeds if cognitively unaware   Social Drivers of Health   Financial Resource Strain: Low Risk  (04/14/2024)   Overall Financial Resource Strain (CARDIA)    Difficulty of Paying Living Expenses: Not hard at all  Food Insecurity: No Food Insecurity (04/14/2024)   Hunger Vital Sign    Worried About Running Out of Food in the Last Year: Never true    Ran Out of Food in the Last Year: Never true  Transportation Needs: Unmet Transportation Needs (04/14/2024)   PRAPARE - Administrator, Civil Service (Medical): Yes    Lack of Transportation (Non-Medical): No  Physical Activity: Inactive (04/14/2024)   Exercise Vital Sign    Days of Exercise per Week: 0 days    Minutes of Exercise per Session: 0 min  Stress: No Stress Concern Present (04/14/2024)   [employer] of Occupational Health - Occupational Stress Questionnaire    Feeling of Stress : Only a little  Social Connections: Socially Isolated (04/14/2024)   Social Connection and Isolation Panel [NHANES]    Frequency of Communication with Friends and Family: More than three times a week    Frequency of Social Gatherings with Friends and Family: Twice a week    Attends Religious Services: Never    Database administrator or Organizations: No    Attends Engineer, structural: Not on file    Marital Status: Widowed    Tobacco Counseling Counseling given: Not Answered    Clinical Intake:  Pre-visit preparation completed: Yes  Pain : No/denies pain     BMI - recorded: 41.6 Nutritional Status: BMI > 30  Obese Nutritional Risks: None Diabetes: No  Lab Results  Component Value Date   HGBA1C 6.1 04/18/2015     How often do you need to have someone help you when you read instructions, pamphlets, or other written materials from your doctor or pharmacy?: 1 - Never  Interpreter Needed?: No  Information  entered by ::  Ruthann Cover Endi Lagman,CMa   Activities of Daily Living     04/14/2024    3:12 PM  In your present state of health, do you have any difficulty performing the following activities:  Hearing? 0  Vision? 0  Difficulty concentrating or making decisions? 0  Walking or climbing stairs? 0  Dressing or bathing? 0  Doing errands, shopping? 0  Preparing Food and eating ? N  Using the Toilet? N  In the past six months, have you accidently leaked urine? Y  Comment yes she has medications  Do you have problems with loss of bowel control? N  Managing your Medications? N  Managing your Finances? N  Housekeeping or managing your Housekeeping? N    Patient Care Team: Helaine Llanos, MD as PCP - General (Pediatrics) Shellie Dials, MD as Consulting Physician (Hematology and Oncology) Jonathan Neighbor, [facility] (Inactive) as Pharmacist (Pharmacist)  Indicate any recent Medical Services you may have received from other than [facility] providers in the past year (date may be approximate).     Assessment:   This is a routine wellness examination for [place].  Hearing/Vision screen Hearing Screening - Comments:: No hearing difficulties Vision Screening - Comments:: Pt wears glasses    Goals Addressed             This Visit's Progress    Patient Stated       Patient states she will like lose weight        Depression Screen     04/14/2024    3:14 PM 01/06/2023   11:42 AM 01/06/2023   10:50 AM 01/02/2022    3:40 PM 08/02/2020    3:15 PM 10/11/2018   11:36 AM 06/03/2017   11:26 AM  PHQ 2/9 Scores  PHQ - 2 Score 0 1 1 1 1  1   PHQ- 9 Score 4        Exception Documentation      Medical reason     Fall Risk     04/14/2024    3:10 PM 01/06/2023   11:42 AM 01/06/2023   10:49 AM 01/02/2022    3:40 PM 08/02/2020    3:15 PM  Fall Risk   Falls in the past year? 0 0 0 0 0  Number falls in past yr: 0  0    Injury with Fall? 0  0    Risk for fall due to : No Fall Risks  No Fall Risks     Follow up Falls prevention discussed;Falls evaluation completed  Falls evaluation completed      MEDICARE RISK AT HOME:  Medicare Risk at Home Any stairs in or around the home?: Yes If so, are there any without handrails?: No Home free of loose throw rugs in walkways, pet beds, electrical cords, etc?: Yes Adequate lighting in your home to reduce risk of falls?: Yes Life alert?: No Use of a cane, walker or w/c?: Yes (cane) Grab bars in the bathroom?: Yes Shower chair or bench in shower?: Yes Elevated toilet seat or a handicapped toilet?: Yes  TIMED UP AND GO:  Was the test performed?  No  Cognitive Function: 6CIT completed        04/14/2024    3:09 PM  6CIT Screen  What Year? 0 points  What month? 0 points  What time? 0 points  Count back from 20 0 points  Months in reverse 0 points  Repeat phrase 0 points  Total Score 0 points    Immunizations  Immunization History  Administered Date(s) Administered   Fluad Quad(high Dose 65+) 09/12/2020, 09/06/2021   Fluad Trivalent(High Dose 65+) 08/27/2023   Influenza Whole 08/26/2011   Influenza, High Dose Seasonal PF 09/04/2015, 09/01/2016   Influenza,inj,Quad PF,6+ Mos 11/01/2014, 09/03/2017, 09/16/2018, 09/09/2019   Influenza-Unspecified 11/01/2014, 09/03/2017   Moderna Sars-Covid-2 Vaccination 12/28/2019, 01/25/2020, 10/19/2020, 07/18/2021   Pneumococcal Conjugate-13 04/14/2014   Pneumococcal Polysaccharide-23 08/15/2010, 06/03/2017   Tdap 08/26/2011   Zoster, Live 12/15/2008, 09/01/2022    Screening Tests Health Maintenance  Topic Date Due   Hepatitis C Screening  Never done   Zoster Vaccines- Shingrix (1 of 2) 10/16/1963   DTaP/Tdap/Td (2 - Td or Tdap) 08/25/2021   COVID-19 Vaccine (5 - 2024-25 season) 08/16/2023   INFLUENZA VACCINE  07/15/2024   Medicare Annual Wellness (AWV)  04/14/2025   Pneumonia Vaccine 47+ Years old  Completed   DEXA SCAN  Completed   HPV VACCINES  Aged Out   Meningococcal B Vaccine  Aged  Out   Colonoscopy  Discontinued    Health Maintenance  Health Maintenance Due  Topic Date Due   Hepatitis C Screening  Never done   Zoster Vaccines- Shingrix (1 of 2) 10/16/1963   DTaP/Tdap/Td (2 - Td or Tdap) 08/25/2021   COVID-19 Vaccine (5 - 2024-25 season) 08/16/2023   Health Maintenance Items Addressed:  Additional Screening:  Vision Screening: Recommended annual ophthalmology exams for early detection of glaucoma and other disorders of the eye.  Dental Screening: Recommended annual dental exams for proper oral hygiene  Community Resource Referral / Chronic Care Management: CRR required this visit?  No   CCM required this visit?  No     Plan:     I have personally reviewed and noted the following in the patient's chart:   Medical and social history Use of alcohol, tobacco or illicit drugs  Current medications and supplements including opioid prescriptions. Patient is not currently taking opioid prescriptions. Functional ability and status Nutritional status Physical activity Advanced directives List of other physicians Hospitalizations, surgeries, and ER visits in previous 12 months Vitals Screenings to include cognitive, depression, and falls Referrals and appointments  In addition, I have reviewed and discussed with patient certain preventive protocols, quality metrics, and best practice recommendations. A written personalized care plan for preventive services as well as general preventive health recommendations were provided to patient.     Freeda Jerry, [place]   04/14/2024   After Visit Summary: (MyChart) Due to this being a telephonic visit, the after visit summary with patients personalized plan was offered to patient via MyChart   Notes: Please refer to Routing Comments.

## 2024-04-21 ENCOUNTER — Encounter: Payer: Self-pay | Admitting: Internal Medicine

## 2024-04-21 ENCOUNTER — Ambulatory Visit: Payer: PPO | Admitting: Internal Medicine

## 2024-04-21 VITALS — BP 132/88 | HR 90 | Temp 98.4°F | Ht 65.0 in | Wt 249.0 lb

## 2024-04-21 DIAGNOSIS — I2699 Other pulmonary embolism without acute cor pulmonale: Secondary | ICD-10-CM | POA: Diagnosis not present

## 2024-04-21 DIAGNOSIS — Z0000 Encounter for general adult medical examination without abnormal findings: Secondary | ICD-10-CM

## 2024-04-21 DIAGNOSIS — I5032 Chronic diastolic (congestive) heart failure: Secondary | ICD-10-CM

## 2024-04-21 DIAGNOSIS — F39 Unspecified mood [affective] disorder: Secondary | ICD-10-CM

## 2024-04-21 MED ORDER — TIRZEPATIDE 2.5 MG/0.5ML ~~LOC~~ SOAJ: 2.5000 mg | SUBCUTANEOUS | 1 refills | Status: DC

## 2024-04-21 NOTE — Assessment & Plan Note (Signed)
Continues on the eliquis 5 bid

## 2024-04-21 NOTE — Assessment & Plan Note (Signed)
 Reactive dysthymia and mild anxiety Rarely uses the lorazepam 

## 2024-04-21 NOTE — Assessment & Plan Note (Signed)
 BMI 41 with CHF and marked disability related to weight Will try mounjaro and titrate as possible

## 2024-04-21 NOTE — Progress Notes (Signed)
 Subjective:    Patient ID: Alice Donaldson, female    DOB: 01/06/1946, 78 y.o.   MRN: [id number]  HPI Here for physical  Is feeling her age "All I do is sit and eat" Back and legs give out with minimal exertion--like walking in the house  Weight fairly stable BMI still ~41 She wants to try weight loss medication  Past recurrent DVT and pulm emboli Two episodes---first provoked, second wasn't On the eliquis  Off oxygen  now No chest pain---but gets breathless easily with any exertion Occ palpitations--fast after exertion No dizziness or syncope Chronic edema  Dislodged something in knee--injection didn't help Hands also ache Uses the etodolac   Rare anxiety issues Has lorazepam --but only used once Finds tizanidine  helps her sleep just as well Episodic depression---usually short lived  Current Outpatient Medications on File Prior to Visit  Medication Sig Dispense Refill   acetaminophen  (TYLENOL ) 500 MG tablet Take 1,000 mg by mouth every 8 (eight) hours as needed.      albuterol  (PROAIR  HFA) 108 (90 Base) MCG/ACT inhaler INHALE 2 PUFFS INTO THE LUNGS EVERY 6 (SIX) HOURS AS NEEDED FOR WHEEZING. 8.5 each 1   apixaban  (ELIQUIS ) 5 MG TABS tablet Take 1 tablet (5 mg total) by mouth 2 (two) times daily. 180 tablet 3   Biotin (BIOTIN 5000) 5 MG CAPS Take by mouth.     cetirizine (ZYRTEC) 10 MG tablet Take 10 mg by mouth at bedtime.      Cholecalciferol (VITAMIN D3) 25 MCG (1000 UT) CAPS      docusate sodium (COLACE) 100 MG capsule Take 100 mg by mouth daily as needed.     etodolac  (LODINE ) 500 MG tablet TAKE 1 TABLET BY MOUTH TWICE DAILY AS NEEDED 180 tablet 0   fluticasone  (FLONASE ) 50 MCG/ACT nasal spray PLACE 2 SPRAYS INTO BOTH NOSTRILS DAILY 48 g 3   Glucos-Chondroit-Hyaluron-MSM (GLUCOSAMINE CHONDROITIN JOINT) TABS Take 2 tablets by mouth daily.     LORazepam  (ATIVAN ) 0.5 MG tablet TAKE ONE-HALF TO 1 TABLET BY MOUTH EVERYDAY AT BEDTIME 30 tablet 0   Menthol-Methyl Salicylate  (SALONPAS PAIN RELIEF PATCH EX) Apply topically. prn     pantoprazole  (PROTONIX ) 40 MG tablet Take 1 tablet (40 mg total) by mouth 2 (two) times daily. 180 tablet 3   polyethylene glycol (MIRALAX  / GLYCOLAX ) packet Take 17 g by mouth daily as needed for moderate constipation.      tiZANidine  (ZANAFLEX ) 2 MG tablet TAKE ONE TABLET BY MOUTH EVERYDAY AT BEDTIME AS NEEDED FOR MUSCLE SPASMS 90 tablet 3   No current facility-administered medications on file prior to visit.    Allergies  Allergen Reactions   Prevpac [Amoxicill-Clarithro-Lansopraz] Other (See Comments)    Throat closed up   Latex Rash    Had issues when wearing powdered gloves all day long while working.   Duloxetine  Other (See Comments)    Nausea, diarrhea and jittery   Tramadol     GI symptoms    Past Medical History:  Diagnosis Date   Allergic rhinitis, cause unspecified    Allergy    Anemia    Complication of anesthesia    Diverticulosis    DVT (deep venous thrombosis) (HCC)    x2.  Last in 9/21   Gastric ulcer    in past   GERD (gastroesophageal reflux disease)    Hemorrhoids, internal    Hyperlipidemia    Motion sickness    ocean ships   Osteoarthrosis involving, or with mention of more than  one site, but not specified as generalized, multiple sites    PONV (postoperative nausea and vomiting)    Ulcer (traumatic) of oral mucosa    Unspecified venous (peripheral) insufficiency    Vertigo    No episodes over 10 years    Past Surgical History:  Procedure Laterality Date   ABDOMINAL HYSTERECTOMY  1996   BREAST BIOPSY  2003   CATARACT EXTRACTION W/PHACO Right 03/25/2022   Procedure: CATARACT EXTRACTION PHACO AND INTRAOCULAR LENS PLACEMENT (IOC) RIGHT  VIVITY LENS 22.90 01:57.1;  Surgeon: Clair Crews, MD;  Location: [facility];  Service: Ophthalmology;  Laterality: Right;  Latex   CHOLECYSTECTOMY  2000   COLONOSCOPY WITH PROPOFOL  N/A 10/02/2015   Procedure: COLONOSCOPY WITH PROPOFOL ;   Surgeon: Deveron Fly, MD;  Location: [facility] ENDOSCOPY;  Service: Endoscopy;  Laterality: N/A;   COLONOSCOPY WITH PROPOFOL  N/A 06/17/2018   Procedure: COLONOSCOPY WITH PROPOFOL ;  Surgeon: Toledo, Alphonsus Jeans, MD;  Location: [facility] ENDOSCOPY;  Service: Gastroenterology;  Laterality: N/A;   DEXA  5/14   Normal   ESOPHAGOGASTRODUODENOSCOPY (EGD) WITH PROPOFOL  N/A 06/17/2018   Procedure: ESOPHAGOGASTRODUODENOSCOPY (EGD) WITH PROPOFOL ;  Surgeon: Toledo, Alphonsus Jeans, MD;  Location: [facility] ENDOSCOPY;  Service: Gastroenterology;  Laterality: N/A;   KNEE ARTHROSCOPY     right in 2007, left in 2011   PERIPHERAL VASCULAR THROMBECTOMY Left 08/27/2020   Procedure: PERIPHERAL VASCULAR THROMBECTOMY;  Surgeon: Celso College, MD;  Location: [facility] INVASIVE CV LAB;  Service: Cardiovascular;  Laterality: Left;   TONSILLECTOMY AND ADENOIDECTOMY  childhood    Family History  Problem Relation Age of Onset   Heart disease Mother    Heart disease Father    Diabetes Son 5       Type 1   Hypertension Son    Stroke Brother    Diabetes Other 2       type 1   Hyperlipidemia Neg Hx    Depression Neg Hx     Social History   Socioeconomic History   Marital status: Widowed    Spouse name: Not on file   Number of children: 1   Years of education: Not on file   Highest education level: Associate degree: academic program  Occupational History   Occupation: Armed forces operational officer    Comment: retired  Tobacco Use   Smoking status: Former    Current packs/day: 0.00    Types: Cigarettes    Quit date: 1990    Years since quitting: 35.3    Passive exposure: Never   Smokeless tobacco: Never  Vaping Use   Vaping status: Never Used  Substance and Sexual Activity   Alcohol use: Yes    Alcohol/week: 2.0 standard drinks of alcohol    Types: 2 Glasses of wine per week    Comment: glass of wine 2x/wk   Drug use: No   Sexual activity: Not on file  Other Topics Concern   Not on file  Social History Narrative   Husband died 03-11-24    Has living will   Asks for Son Hansel Ley to make decisions   Would accept resuscitation--but no prolonged ventilation   Doesn't want tube feeds if cognitively unaware   Social Drivers of Health   Financial Resource Strain: Low Risk  (04/14/2024)   Overall Financial Resource Strain (CARDIA)    Difficulty of Paying Living Expenses: Not hard at all  Food Insecurity: No Food Insecurity (04/14/2024)   Hunger Vital Sign    Worried About Running Out of Food in the  Last Year: Never true    Ran Out of Food in the Last Year: Never true  Transportation Needs: Unmet Transportation Needs (04/14/2024)   PRAPARE - Administrator, Civil Service (Medical): Yes    Lack of Transportation (Non-Medical): No  Physical Activity: Inactive (04/14/2024)   Exercise Vital Sign    Days of Exercise per Week: 0 days    Minutes of Exercise per Session: 0 min  Stress: No Stress Concern Present (04/14/2024)   [employer] of Occupational Health - Occupational Stress Questionnaire    Feeling of Stress : Only a little  Social Connections: Socially Isolated (04/14/2024)   Social Connection and Isolation Panel [NHANES]    Frequency of Communication with Friends and Family: More than three times a week    Frequency of Social Gatherings with Friends and Family: Twice a week    Attends Religious Services: Never    Database administrator or Organizations: No    Attends Engineer, structural: Not on file    Marital Status: Widowed  Intimate Partner Violence: Not At Risk (04/14/2024)   Humiliation, Afraid, Rape, and Kick questionnaire    Fear of Current or Ex-Partner: No    Emotionally Abused: No    Physically Abused: No    Sexually Abused: No   Review of Systems  Constitutional:  Positive for fatigue.       Wears seat belt  HENT:  Positive for tinnitus. Negative for dental problem and hearing loss.        Overdue for dentist--bad experiences in past  Eyes:  Negative for visual disturbance.       No  diplopia or unilateral vision loss  Respiratory:  Positive for shortness of breath.   Cardiovascular:  Positive for palpitations and leg swelling. Negative for chest pain.  Gastrointestinal:  Negative for blood in stool and constipation.       No heartburn on the pantoprazole   Endocrine: Negative for polydipsia.  Genitourinary:  Positive for frequency. Negative for dysuria.  Musculoskeletal:  Positive for arthralgias and back pain. Negative for joint swelling.  Skin:  Negative for rash.  Allergic/Immunologic: Positive for environmental allergies. Negative for immunocompromised state.       Zyrtec helps  Neurological:  Positive for headaches. Negative for dizziness, syncope and light-headedness.       Allergy headaches  Hematological:  Negative for adenopathy. Bruises/bleeds easily.  Psychiatric/Behavioral:  Positive for dysphoric mood and sleep disturbance. The patient is nervous/anxious.        Objective:   Physical Exam Constitutional:      Appearance: Normal appearance. She is obese.  HENT:     Mouth/Throat:     Pharynx: No oropharyngeal exudate or posterior oropharyngeal erythema.  Eyes:     Conjunctiva/sclera: Conjunctivae normal.     Pupils: Pupils are equal, round, and reactive to light.  Cardiovascular:     Rate and Rhythm: Normal rate and regular rhythm.     Pulses: Normal pulses.     Heart sounds: No murmur heard.    No gallop.  Pulmonary:     Effort: Pulmonary effort is normal.     Breath sounds: Normal breath sounds. No wheezing or rales.     Comments: Easy dyspnea Abdominal:     Palpations: Abdomen is soft.     Tenderness: There is no abdominal tenderness.  Musculoskeletal:     Cervical back: Neck supple.     Right lower leg: No edema.     Left  lower leg: No edema.  Lymphadenopathy:     Cervical: No cervical adenopathy.  Skin:    Findings: No rash.  Neurological:     General: No focal deficit present.     Mental Status: She is alert and oriented to  person, place, and time.  Psychiatric:        Mood and Affect: Mood normal.        Behavior: Behavior normal.            Assessment & Plan:

## 2024-04-21 NOTE — Assessment & Plan Note (Addendum)
 Mostly with anemia but has marked DOE Largely weight and orthopedic related

## 2024-04-21 NOTE — Assessment & Plan Note (Signed)
 Done with cancer screening--despite past polyps, 2019 colonoscopy was normal Last mammogram at age 78 Prefers no COVID vaccine Flu and RSV in the fall

## 2024-04-22 ENCOUNTER — Encounter: Payer: Self-pay | Admitting: Internal Medicine

## 2024-04-22 ENCOUNTER — Telehealth: Payer: Self-pay

## 2024-04-22 ENCOUNTER — Other Ambulatory Visit (HOSPITAL_COMMUNITY): Payer: Self-pay

## 2024-04-22 ENCOUNTER — Encounter: Payer: Self-pay | Admitting: Oncology

## 2024-04-22 NOTE — Telephone Encounter (Deleted)
 Pt is asking for medication to help her sleep. Pt recommended Trazadone; Sent MyChart message to pt informing her that trazadone is questionable due to the blood thinning meds she is on. Informed Dr will get back to her with recommendations.

## 2024-04-22 NOTE — Telephone Encounter (Signed)
 Ozempic/Mounjaro  is approved exclusively as an adjunct to diet and exercise to improve glycemic control in adults with type 2 diabetes mellitus. A review of patient's medical chart reveals no documented diagnosis of type 2 diabetes or an A1C indicative of diabetes. Therefore, they do not currently meet the criteria for prior authorization of this medication. If clinically appropriate, alternative options such as Saxenda, Zepbound , or Frederik Jansky may be considered for this patient. Please advise

## 2024-04-27 ENCOUNTER — Telehealth: Payer: Self-pay

## 2024-04-27 ENCOUNTER — Other Ambulatory Visit (HOSPITAL_COMMUNITY): Payer: Self-pay

## 2024-04-27 MED ORDER — TIRZEPATIDE-WEIGHT MANAGEMENT 2.5 MG/0.5ML ~~LOC~~ SOLN: 2.5000 mg | SUBCUTANEOUS | 2 refills | Status: AC

## 2024-04-27 NOTE — Addendum Note (Signed)
 Addended by: Curt Dover I on: 04/27/2024 07:30 AM   Modules accepted: Orders

## 2024-04-27 NOTE — Telephone Encounter (Addendum)
 Please see message from pt in this message. Pt is asking for PA to be done so it can be documented why she needs the medication.

## 2024-04-27 NOTE — Telephone Encounter (Signed)
 Pharmacy Patient Advocate Encounter   Received notification from CoverMyMeds that prior authorization for Zepbound 2.5MG /0.5ML pen-injectors is required/requested.   Insurance verification completed.   The patient is insured through [facility] ADVANTAGE/RX ADVANCE .   Per test claim:

## 2024-04-27 NOTE — Telephone Encounter (Signed)
 PA submitted via CMM. Key: [id number]

## 2024-04-28 NOTE — Telephone Encounter (Signed)
 Pharmacy Patient Advocate Encounter  Received notification from [facility] ADVANTAGE/RX ADVANCE that Prior Authorization for Zepbound 2.5MG /0.5ML pen-injectors has been DENIED.  Full denial letter will be uploaded to the media tab. See denial reason below.   PA #/Case ID/Reference #: [id number]

## 2024-05-03 ENCOUNTER — Encounter (INDEPENDENT_AMBULATORY_CARE_PROVIDER_SITE_OTHER): Payer: Self-pay

## 2024-05-06 ENCOUNTER — Encounter: Payer: Self-pay | Admitting: Internal Medicine

## 2024-05-14 ENCOUNTER — Encounter: Payer: Self-pay | Admitting: Oncology

## 2024-05-23 ENCOUNTER — Other Ambulatory Visit: Payer: Self-pay | Admitting: *Deleted

## 2024-05-23 DIAGNOSIS — D500 Iron deficiency anemia secondary to blood loss (chronic): Secondary | ICD-10-CM

## 2024-05-24 ENCOUNTER — Inpatient Hospital Stay: Attending: Oncology

## 2024-05-24 DIAGNOSIS — D500 Iron deficiency anemia secondary to blood loss (chronic): Secondary | ICD-10-CM

## 2024-05-24 DIAGNOSIS — D509 Iron deficiency anemia, unspecified: Secondary | ICD-10-CM | POA: Insufficient documentation

## 2024-05-24 LAB — CBC WITH DIFFERENTIAL (CANCER CENTER ONLY)
Abs Immature Granulocytes: 0.04 10*3/uL (ref 0.00–0.07)
Basophils Absolute: 0.1 10*3/uL (ref 0.0–0.1)
Basophils Relative: 1 %
Eosinophils Absolute: 0.3 10*3/uL (ref 0.0–0.5)
Eosinophils Relative: 5 %
HCT: 43.5 % (ref 36.0–46.0)
Hemoglobin: 14.3 g/dL (ref 12.0–15.0)
Immature Granulocytes: 1 %
Lymphocytes Relative: 30 %
Lymphs Abs: 1.8 10*3/uL (ref 0.7–4.0)
MCH: 30.7 pg (ref 26.0–34.0)
MCHC: 32.9 g/dL (ref 30.0–36.0)
MCV: 93.3 fL (ref 80.0–100.0)
Monocytes Absolute: 0.8 10*3/uL (ref 0.1–1.0)
Monocytes Relative: 13 %
Neutro Abs: 3.1 10*3/uL (ref 1.7–7.7)
Neutrophils Relative %: 50 %
Platelet Count: 217 10*3/uL (ref 150–400)
RBC: 4.66 MIL/uL (ref 3.87–5.11)
RDW: 12.9 % (ref 11.5–15.5)
WBC Count: 6 10*3/uL (ref 4.0–10.5)
nRBC: 0 % (ref 0.0–0.2)

## 2024-05-24 LAB — IRON AND TIBC
Iron: 62 ug/dL (ref 28–170)
Saturation Ratios: 15 % (ref 10.4–31.8)
TIBC: 413 ug/dL (ref 250–450)
UIBC: 351 ug/dL

## 2024-05-24 LAB — FERRITIN: Ferritin: 15 ng/mL (ref 11–307)

## 2024-05-25 ENCOUNTER — Inpatient Hospital Stay: Admitting: Oncology

## 2024-05-25 ENCOUNTER — Inpatient Hospital Stay

## 2024-08-17 ENCOUNTER — Encounter: Payer: Self-pay | Admitting: Oncology

## 2024-08-17 DIAGNOSIS — F411 Generalized anxiety disorder: Secondary | ICD-10-CM | POA: Diagnosis not present

## 2024-08-17 DIAGNOSIS — Z7901 Long term (current) use of anticoagulants: Secondary | ICD-10-CM | POA: Diagnosis not present

## 2024-08-17 DIAGNOSIS — E66813 Obesity, class 3: Secondary | ICD-10-CM | POA: Diagnosis not present

## 2024-08-17 DIAGNOSIS — R7989 Other specified abnormal findings of blood chemistry: Secondary | ICD-10-CM | POA: Diagnosis not present

## 2024-08-17 DIAGNOSIS — Z6841 Body Mass Index (BMI) 40.0 and over, adult: Secondary | ICD-10-CM | POA: Diagnosis not present

## 2024-08-17 DIAGNOSIS — R739 Hyperglycemia, unspecified: Secondary | ICD-10-CM | POA: Diagnosis not present

## 2024-08-17 DIAGNOSIS — D500 Iron deficiency anemia secondary to blood loss (chronic): Secondary | ICD-10-CM | POA: Diagnosis not present

## 2024-08-17 DIAGNOSIS — I2699 Other pulmonary embolism without acute cor pulmonale: Secondary | ICD-10-CM | POA: Diagnosis not present

## 2024-08-25 ENCOUNTER — Other Ambulatory Visit

## 2024-08-29 ENCOUNTER — Ambulatory Visit: Admitting: Oncology

## 2024-08-29 ENCOUNTER — Ambulatory Visit

## 2024-11-28 ENCOUNTER — Other Ambulatory Visit: Payer: Self-pay | Admitting: *Deleted

## 2024-11-28 DIAGNOSIS — D500 Iron deficiency anemia secondary to blood loss (chronic): Secondary | ICD-10-CM

## 2024-11-29 ENCOUNTER — Inpatient Hospital Stay: Attending: Oncology

## 2024-11-29 DIAGNOSIS — D500 Iron deficiency anemia secondary to blood loss (chronic): Secondary | ICD-10-CM

## 2024-11-29 DIAGNOSIS — D509 Iron deficiency anemia, unspecified: Secondary | ICD-10-CM | POA: Diagnosis present

## 2024-11-29 LAB — CBC WITH DIFFERENTIAL/PLATELET
Abs Immature Granulocytes: 0.03 K/uL (ref 0.00–0.07)
Basophils Absolute: 0.1 K/uL (ref 0.0–0.1)
Basophils Relative: 1 %
Eosinophils Absolute: 0.2 K/uL (ref 0.0–0.5)
Eosinophils Relative: 3 %
HCT: 42.9 % (ref 36.0–46.0)
Hemoglobin: 14 g/dL (ref 12.0–15.0)
Immature Granulocytes: 1 %
Lymphocytes Relative: 31 %
Lymphs Abs: 2 K/uL (ref 0.7–4.0)
MCH: 29.9 pg (ref 26.0–34.0)
MCHC: 32.6 g/dL (ref 30.0–36.0)
MCV: 91.7 fL (ref 80.0–100.0)
Monocytes Absolute: 0.7 K/uL (ref 0.1–1.0)
Monocytes Relative: 11 %
Neutro Abs: 3.4 K/uL (ref 1.7–7.7)
Neutrophils Relative %: 53 %
Platelets: 215 K/uL (ref 150–400)
RBC: 4.68 MIL/uL (ref 3.87–5.11)
RDW: 13.2 % (ref 11.5–15.5)
WBC: 6.4 K/uL (ref 4.0–10.5)
nRBC: 0 % (ref 0.0–0.2)

## 2024-11-29 LAB — IRON AND TIBC
Iron: 86 ug/dL (ref 28–170)
Saturation Ratios: 21 % (ref 10.4–31.8)
TIBC: 405 ug/dL (ref 250–450)
UIBC: 318 ug/dL

## 2024-11-29 LAB — FERRITIN: Ferritin: 24 ng/mL (ref 11–307)

## 2024-11-30 ENCOUNTER — Encounter: Payer: Self-pay | Admitting: Oncology

## 2024-11-30 ENCOUNTER — Inpatient Hospital Stay

## 2024-11-30 ENCOUNTER — Inpatient Hospital Stay: Admitting: Oncology

## 2024-11-30 NOTE — Telephone Encounter (Signed)
 1012-Patient just called triage to inquire about her labs. I updated her and explained that Dr. Jacobo just wrote that she didn't need to come into the clinic today. I explained to her that scheduling will be reaching out to her schedule her out 3 months. She thanked me for speaking to her and will look fwd to scheduling team reaching out to her.

## 2025-02-28 ENCOUNTER — Inpatient Hospital Stay

## 2025-03-01 ENCOUNTER — Inpatient Hospital Stay

## 2025-03-01 ENCOUNTER — Inpatient Hospital Stay: Admitting: Oncology
